# Patient Record
Sex: Female | Born: 1935
Health system: Southern US, Community
[De-identification: ages and names within clinical notes are randomized; demographics above are authoritative.]

## PROBLEM LIST (undated history)

## (undated) DIAGNOSIS — R0602 Shortness of breath: Secondary | ICD-10-CM

## (undated) DIAGNOSIS — I10 Essential (primary) hypertension: Secondary | ICD-10-CM

## (undated) DIAGNOSIS — G988 Other disorders of nervous system: Secondary | ICD-10-CM

## (undated) DIAGNOSIS — E559 Vitamin D deficiency, unspecified: Secondary | ICD-10-CM

## (undated) DIAGNOSIS — R05 Cough: Secondary | ICD-10-CM

## (undated) DIAGNOSIS — E0590 Thyrotoxicosis, unspecified without thyrotoxic crisis or storm: Secondary | ICD-10-CM

## (undated) DIAGNOSIS — N183 Chronic kidney disease, stage 3 unspecified: Secondary | ICD-10-CM

## (undated) DIAGNOSIS — R918 Other nonspecific abnormal finding of lung field: Secondary | ICD-10-CM

## (undated) DIAGNOSIS — D696 Thrombocytopenia, unspecified: Secondary | ICD-10-CM

## (undated) DIAGNOSIS — C801 Malignant (primary) neoplasm, unspecified: Secondary | ICD-10-CM

## (undated) DIAGNOSIS — M1990 Unspecified osteoarthritis, unspecified site: Secondary | ICD-10-CM

## (undated) DIAGNOSIS — K219 Gastro-esophageal reflux disease without esophagitis: Secondary | ICD-10-CM

## (undated) DIAGNOSIS — R519 Headache, unspecified: Secondary | ICD-10-CM

## (undated) DIAGNOSIS — R51 Headache: Secondary | ICD-10-CM

## (undated) DIAGNOSIS — I129 Hypertensive chronic kidney disease with stage 1 through stage 4 chronic kidney disease, or unspecified chronic kidney disease: Secondary | ICD-10-CM

## (undated) HISTORY — DX: Hypertensive chronic kidney disease with stage 1 through stage 4 chronic kidney disease, or unspecified chronic kidney disease: I12.9

## (undated) HISTORY — DX: Unspecified osteoarthritis, unspecified site: M19.90

## (undated) HISTORY — PX: BACK SURGERY: SHX140

## (undated) HISTORY — PX: NECK SURGERY: SHX720

## (undated) HISTORY — DX: Thrombocytopenia, unspecified: D69.6

## (undated) HISTORY — DX: Other disorders of nervous system: G98.8

## (undated) HISTORY — DX: Other nonspecific abnormal finding of lung field: R91.8

## (undated) HISTORY — DX: Chronic kidney disease, stage 3 unspecified: N18.30

## (undated) HISTORY — DX: Vitamin D deficiency, unspecified: E55.9

## (undated) HISTORY — DX: Cough: R05

## (undated) HISTORY — DX: Chronic kidney disease, stage 3 (moderate): N18.3

## (undated) HISTORY — PX: EYE SURGERY: SHX253

---

## 2003-04-21 ENCOUNTER — Ambulatory Visit (HOSPITAL_COMMUNITY): Admission: RE | Admit: 2003-04-21 | Discharge: 2003-04-21 | Payer: Self-pay | Admitting: Internal Medicine

## 2003-10-11 ENCOUNTER — Emergency Department (HOSPITAL_COMMUNITY): Admission: EM | Admit: 2003-10-11 | Discharge: 2003-10-11 | Payer: Self-pay | Admitting: Emergency Medicine

## 2003-12-15 ENCOUNTER — Ambulatory Visit: Payer: Self-pay | Admitting: Family Medicine

## 2004-04-04 ENCOUNTER — Encounter: Admission: RE | Admit: 2004-04-04 | Discharge: 2004-04-04 | Payer: Self-pay | Admitting: Orthopedic Surgery

## 2004-06-27 ENCOUNTER — Inpatient Hospital Stay (HOSPITAL_COMMUNITY): Admission: RE | Admit: 2004-06-27 | Discharge: 2004-07-01 | Payer: Self-pay | Admitting: Neurological Surgery

## 2005-02-10 ENCOUNTER — Emergency Department (HOSPITAL_COMMUNITY): Admission: EM | Admit: 2005-02-10 | Discharge: 2005-02-10 | Payer: Self-pay | Admitting: Family Medicine

## 2005-07-22 ENCOUNTER — Emergency Department (HOSPITAL_COMMUNITY): Admission: EM | Admit: 2005-07-22 | Discharge: 2005-07-22 | Payer: Self-pay | Admitting: Family Medicine

## 2005-08-15 ENCOUNTER — Emergency Department (HOSPITAL_COMMUNITY): Admission: EM | Admit: 2005-08-15 | Discharge: 2005-08-15 | Payer: Self-pay | Admitting: Family Medicine

## 2005-09-13 ENCOUNTER — Emergency Department (HOSPITAL_COMMUNITY): Admission: EM | Admit: 2005-09-13 | Discharge: 2005-09-13 | Payer: Self-pay | Admitting: Family Medicine

## 2005-09-23 ENCOUNTER — Emergency Department (HOSPITAL_COMMUNITY): Admission: EM | Admit: 2005-09-23 | Discharge: 2005-09-23 | Payer: Self-pay | Admitting: Family Medicine

## 2005-10-16 ENCOUNTER — Encounter: Admission: RE | Admit: 2005-10-16 | Discharge: 2005-10-16 | Payer: Self-pay | Admitting: Neurology

## 2005-11-15 ENCOUNTER — Emergency Department (HOSPITAL_COMMUNITY): Admission: EM | Admit: 2005-11-15 | Discharge: 2005-11-15 | Payer: Self-pay | Admitting: Emergency Medicine

## 2006-08-22 ENCOUNTER — Emergency Department (HOSPITAL_COMMUNITY): Admission: EM | Admit: 2006-08-22 | Discharge: 2006-08-22 | Payer: Self-pay | Admitting: Family Medicine

## 2006-08-31 ENCOUNTER — Emergency Department (HOSPITAL_COMMUNITY): Admission: EM | Admit: 2006-08-31 | Discharge: 2006-08-31 | Payer: Self-pay | Admitting: Emergency Medicine

## 2007-11-19 ENCOUNTER — Encounter: Admission: RE | Admit: 2007-11-19 | Discharge: 2007-11-19 | Payer: Self-pay | Admitting: Neurology

## 2008-04-06 ENCOUNTER — Emergency Department (HOSPITAL_COMMUNITY): Admission: EM | Admit: 2008-04-06 | Discharge: 2008-04-06 | Payer: Self-pay | Admitting: Family Medicine

## 2008-08-11 ENCOUNTER — Inpatient Hospital Stay (HOSPITAL_COMMUNITY): Admission: RE | Admit: 2008-08-11 | Discharge: 2008-08-12 | Payer: Self-pay | Admitting: Neurological Surgery

## 2009-02-05 ENCOUNTER — Encounter (INDEPENDENT_AMBULATORY_CARE_PROVIDER_SITE_OTHER): Payer: Self-pay | Admitting: *Deleted

## 2009-02-09 ENCOUNTER — Encounter (INDEPENDENT_AMBULATORY_CARE_PROVIDER_SITE_OTHER): Payer: Self-pay | Admitting: *Deleted

## 2009-02-11 ENCOUNTER — Ambulatory Visit: Payer: Self-pay | Admitting: Gastroenterology

## 2009-02-19 ENCOUNTER — Ambulatory Visit: Payer: Self-pay | Admitting: Gastroenterology

## 2009-02-26 ENCOUNTER — Ambulatory Visit: Payer: Self-pay | Admitting: Gastroenterology

## 2009-03-02 ENCOUNTER — Encounter: Payer: Self-pay | Admitting: Gastroenterology

## 2009-04-05 ENCOUNTER — Encounter: Admission: RE | Admit: 2009-04-05 | Discharge: 2009-04-05 | Payer: Self-pay | Admitting: Obstetrics and Gynecology

## 2009-06-14 ENCOUNTER — Encounter (HOSPITAL_COMMUNITY): Admission: RE | Admit: 2009-06-14 | Discharge: 2009-09-12 | Payer: Self-pay | Admitting: Endocrinology

## 2009-07-08 ENCOUNTER — Ambulatory Visit (HOSPITAL_COMMUNITY): Admission: RE | Admit: 2009-07-08 | Discharge: 2009-07-08 | Payer: Self-pay | Admitting: Endocrinology

## 2009-08-04 ENCOUNTER — Encounter: Admission: RE | Admit: 2009-08-04 | Discharge: 2009-08-04 | Payer: Self-pay | Admitting: Endocrinology

## 2009-08-04 ENCOUNTER — Other Ambulatory Visit: Admission: RE | Admit: 2009-08-04 | Discharge: 2009-08-04 | Payer: Self-pay | Admitting: Interventional Radiology

## 2009-08-27 ENCOUNTER — Ambulatory Visit (HOSPITAL_COMMUNITY): Admission: RE | Admit: 2009-08-27 | Discharge: 2009-08-27 | Payer: Self-pay | Admitting: Endocrinology

## 2009-09-29 ENCOUNTER — Encounter: Admission: RE | Admit: 2009-09-29 | Discharge: 2009-09-29 | Payer: Self-pay | Admitting: Obstetrics and Gynecology

## 2010-02-13 ENCOUNTER — Encounter: Payer: Self-pay | Admitting: Orthopedic Surgery

## 2010-02-13 ENCOUNTER — Encounter: Payer: Self-pay | Admitting: Endocrinology

## 2010-02-22 NOTE — Letter (Signed)
Summary: Anticoagulation Modification Letter  [facility]  [place], [place] [address]   Phone: [phone]  Fax: [phone]    February 11, 2009  Re:    [name] DOB:    07/01/1936 MRN:    [id number]    Dear Dr Margette Fast,  We have scheduled the above patient for an endoscopic procedure. Our records show that  he/she is on anticoagulation therapy. Please advise as to how long the patient may come off their therapy of plavix prior to the scheduled procedure(s) on 02/26/09.   Please fax back/or route the completed form to Patty at [phone].  Thank you for your help with this matter.  Sincerely,  Christian Mate CMA Deborra Medina)   Physician Recommendation:  Hold Plavix 7 days prior ________________  Hold Coumadin 5 days prior ____________  Other ______________________________     Appended Document: Anticoagulation Modification Letter response recieved from Dr Jannifer Franklin pt aware ok to hold plavix and letter to be scanned into EMR

## 2010-02-22 NOTE — Miscellaneous (Signed)
Summary: SCREENING COLON AGE--CH  Clinical Lists Changes  Medications: Added new medication of MOVIPREP 100 GM  SOLR (PEG-KCL-NACL-NASULF-NA ASC-C) As directed - Signed Rx of MOVIPREP 100 GM  SOLR (PEG-KCL-NACL-NASULF-NA ASC-C) As directed;  #1 x 0;  Signed;  Entered by: Ernestine Conrad RN;  Authorized by: Milus Banister MD;  Method used: Electronically to [facility]. [phone]*, [address] [place], [place], [place]  [address], Ph: [phone], Fax: [phone] Allergies: Added new allergy or adverse reaction of ASA Observations: Added new observation of NKA: F (02/11/2009 12:36)    Prescriptions: MOVIPREP 100 GM  SOLR (PEG-KCL-NACL-NASULF-NA ASC-C) As directed  #1 x 0   Entered by:   Ernestine Conrad RN   Authorized by:   Milus Banister MD   Signed by:   Ernestine Conrad RN on 02/11/2009   Method used:   Electronically to        [facility] [place] (retail)       [address]       [place], [place]  [address]       Ph: [phone]       Fax: [phone]   RxID:   [id number]

## 2010-02-22 NOTE — Procedures (Signed)
Summary: Request to hold plavix prior to procedure/[facility]  Request to hold plavix prior to procedure/[facility]   Imported By: Phillis Knack 02/25/2009 08:48:20  _____________________________________________________________________  External Attachment:    Type:   Image     Comment:   External Document

## 2010-02-22 NOTE — Letter (Signed)
Summary: Previsit letter  [facility]  [place], [place] [address]   Phone: [phone]  Fax: [phone]       02/05/2009 MRN: [id number]  [name] [address] [place], [place]  [address]  Dear Ms. Hinkson,  Welcome to the Gastroenterology Division at [facility].    You are scheduled to see a nurse for your pre-procedure visit on 02-12-09 at 1:30p.m. on the 3rd floor at [facility], [place] [employer].  We ask that you try to arrive at our office 15 minutes prior to your appointment time to allow for check-in.  Your nurse visit will consist of discussing your medical and surgical history, your immediate family medical history, and your medications.    Please bring a complete list of all your medications or, if you prefer, bring the medication bottles and we will list them.  We will need to be aware of both prescribed and over the counter drugs.  We will need to know exact dosage information as well.  If you are on blood thinners (Coumadin, Plavix, Aggrenox, Ticlid, etc.) please call our office today/prior to your appointment, as we need to consult with your physician about holding your medication.   Please be prepared to read and sign documents such as consent forms, a financial agreement, and acknowledgement forms.  If necessary, and with your consent, a friend or relative is welcome to sit-in on the nurse visit with you.  Please bring your insurance card so that we may make a copy of it.  If your insurance requires a referral to see a specialist, please bring your referral form from your primary care physician.  No co-pay is required for this nurse visit.     If you cannot keep your appointment, please call [phone] to cancel or reschedule prior to your appointment date.  This allows [place] the opportunity to schedule an appointment for another patient in need of care.    Thank you for choosing [facility] for your medical needs.   We appreciate the opportunity to care for you.  Please visit [place] at our website  to learn more about our practice.                     Sincerely.                                                                                                                   The Gastroenterology Division

## 2010-02-22 NOTE — Letter (Signed)
Summary: [facility] Instructions  [facility]  [place], [place] [address]   Phone: [phone]  Fax: [phone]       [name]    07/01/1936    MRN: [id number]        Procedure Day Sudie Grumbling:  Wendi Snipes  02/26/09     Arrival Time:  8:30AM      Procedure Time:  9:30AM     Location of Procedure:                    [facility] _  [place] (4th Floor)                        [place]   Starting 5 days prior to your procedure 02/21/09 do not eat nuts, seeds, popcorn, corn, beans, peas,  salads, or any raw vegetables.  Do not take any fiber supplements (e.g. Metamucil, Citrucel, and Benefiber).  THE DAY BEFORE YOUR PROCEDURE         DATE: 02/25/09  DAY: THURSDAY  1.  Drink clear liquids the entire day-NO SOLID FOOD  2.  Do not drink anything colored red or purple.  Avoid juices with pulp.  No orange juice.  3.  Drink at least 64 oz. (8 glasses) of fluid/clear liquids during the day to prevent dehydration and help the prep work efficiently.  CLEAR LIQUIDS INCLUDE: Water Jello Ice Popsicles Tea (sugar ok, no milk/cream) Powdered fruit flavored drinks Coffee (sugar ok, no milk/cream) Gatorade Juice: apple, white grape, white cranberry  Lemonade Clear bullion, consomm, broth Carbonated beverages (any kind) Strained chicken noodle soup Hard Candy                             4.  In the morning, mix first dose of MoviPrep solution:    Empty 1 Pouch A and 1 Pouch B into the disposable container    Add lukewarm drinking water to the top line of the container. Mix to dissolve    Refrigerate (mixed solution should be used within 24 hrs)  5.  Begin drinking the prep at 5:00 p.m. The MoviPrep container is divided by 4 marks.   Every 15 minutes drink the solution down to the next mark (approximately 8 oz) until the full liter is complete.   6.  Follow completed prep with 16 oz of clear liquid of your choice (Nothing  red or purple).  Continue to drink clear liquids until bedtime.  7.  Before going to bed, mix second dose of MoviPrep solution:    Empty 1 Pouch A and 1 Pouch B into the disposable container    Add lukewarm drinking water to the top line of the container. Mix to dissolve    Refrigerate  THE DAY OF YOUR PROCEDURE      DATE: 02/26/09 DAY: FRIDAY  Beginning at 4:30AM (5 hours before procedure):         1. Every 15 minutes, drink the solution down to the next mark (approx 8 oz) until the full liter is complete.  2. Follow completed prep with 16 oz. of clear liquid of your choice.    3. You may drink clear liquids until 7:30AM (2 HOURS BEFORE PROCEDURE).   MEDICATION INSTRUCTIONS  Unless otherwise instructed, you should take regular prescription medications with a small sip of water   as early as possible the morning  of your procedure.  Diabetic patients - see separate instructions.  Stop taking Plavix or Aggrenox on  _  _  (7 days before procedure).              OTHER INSTRUCTIONS  You will need a responsible adult at least 73 years of age to accompany you and drive you home.   This person must remain in the waiting room during your procedure.  Wear loose fitting clothing that is easily removed.  Leave jewelry and other valuables at home.  However, you may wish to bring a book to read or  an iPod/MP3 player to listen to music as you wait for your procedure to start.  Remove all body piercing jewelry and leave at home.  Total time from sign-in until discharge is approximately 2-3 hours.  You should go home directly after your procedure and rest.  You can resume normal activities the  day after your procedure.  The day of your procedure you should not:   Drive   Make legal decisions   Operate machinery   Drink alcohol   Return to work  You will receive specific instructions about eating, activities and medications before you leave.    The above  instructions have been reviewed and explained to me by   Ernestine Conrad, RN_____________________    I fully understand and can verbalize these instructions _____________________________ Date _________

## 2010-02-22 NOTE — Miscellaneous (Signed)
Summary: Problem added to wrong chart  Clinical Lists Changes  Problems: Removed problem of ABDOMINAL PAIN RIGHT UPPER QUADRANT (ICD-789.01) Removed problem of HEARTBURN (ICD-787.1)

## 2010-02-22 NOTE — Procedures (Signed)
Summary: Colonoscopy  Patient: [name] Note: All result statuses are Final unless otherwise noted.  Tests: (1) Colonoscopy (COL)   COL Colonoscopy           [facility].     [place], [place]  [address]           COLONOSCOPY PROCEDURE REPORT           PATIENT:  Cheryl, Bishop  MR#:  [id number]     BIRTHDATE:  07/01/1936, 23 yrs. old  GENDER:  female     ENDOSCOPIST:  Milus Banister, MD     Referred by:  Arvella Nigh, M.D.     PROCEDURE DATE:  02/26/2009     PROCEDURE:  Colonoscopy with biopsy and snare polypectomy     ASA CLASS:  Class II     INDICATIONS:  Routine Risk Screening     MEDICATIONS:   Fentanyl 50 mcg IV, Versed 7 mg IV     DESCRIPTION OF PROCEDURE:   After the risks benefits and     alternatives of the procedure were thoroughly explained, informed     consent was obtained.  Digital rectal exam was performed and     revealed no rectal masses.   The LB [id number]-[id number] endoscope     was introduced through the anus and advanced to the cecum, which     was identified by both the appendix and ileocecal valve, without     limitations.  The quality of the prep was good, using MoviPrep.     The instrument was then slowly withdrawn as the colon was fully     examined.     <<PROCEDUREIMAGES>>     FINDINGS:  Three small sessile polyps were found. These were     located in transverse and descending colon segments, they ranged     in size from 2-30mm across. Two were removed with cold snare, one     with cold biopsy. All were sent to pathology (jar 1) (see image3,     image4, and image5).  This was otherwise a normal examination of     the colon (see image2, image1, and image6).   Retroflexed views in     the rectum revealed no abnormalities.    The scope was then     withdrawn from the patient and the procedure completed.           COMPLICATIONS:  None     ENDOSCOPIC IMPRESSION:     1) Three small polyps, all removed and sent to  pathology     2) Otherwise normal examination           RECOMMENDATIONS:     1) If the polyp(s) removed today are proven to be adenomatous     (pre-cancerous) polyps, you will need a colonoscopy in 3-5 years.     Otherwise you should continue to follow colorectal cancer     screening guidelines for "routine risk" patients with a     colonoscopy in 10 years.     2) You will receive a letter within 1-2 weeks with the results     of your biopsy as well as final recommendations. Please call my     office if you have not received a letter after 3 weeks.     3) OK to restart Plavix today.           ______________________________  Milus Banister, MD           n.     Lorrin Mais:   Milus Banister at 02/26/2009 09:33 AM           Barrie Dunker, [id number]  Note: An exclamation mark (!) indicates a result that was not dispersed into the flowsheet. Document Creation Date: 02/26/2009 9:34 AM _______________________________________________________________________  (1) Order result status: Final Collection or observation date-time: 02/26/2009 09:28 Requested date-time:  Receipt date-time:  Reported date-time:  Referring Physician:   Ordering Physician: Owens Loffler [phone]) Specimen Source:  Source: Tawanna Cooler Order Number: [id number] Lab site:   Appended Document: Colonoscopy recall     Procedures Next Due Date:    Colonoscopy: 02/2012

## 2010-02-22 NOTE — Letter (Signed)
Summary: Results Letter  [facility]  [place], [place] [address]   Phone: [phone]  Fax: [phone]        March 02, 2009 MRN: [id number]    [name] [address] [place], [place]  [address]    Dear [name],   The polyp(s) removed during your recent procedure were proven to be adenomatous.  These are pre-cancerous polyps that may have grown into cancers if they had not been removed.  Based on current nationally recognized surveillance guidelines, I recommend that you have a repeat colonoscopy in 3 years.   We will therefore put your information in our reminder system and will contact you in 3 years to schedule a repeat procedure.  Please call if you have any questions or concerns.       Sincerely,  Milus Banister MD  This letter has been electronically signed by your physician.  Appended Document: Results Letter Letter mailed 2.10.11

## 2010-02-22 NOTE — Assessment & Plan Note (Signed)
History of Present Illness Visit Type: new patient Primary GI MD: Owens Loffler MD Chief Complaint: screening colon-on Plavix History of Present Illness:     very pleasant 75 year old woman who is here to discuss screening colonoscopy.  has never had colonoscopy or colorectal cancer screening from what I can tell..  Used to have constipation, but this is better now.  No bleeding.  Her weight in the past year is up a few pounds.  She had a neurologic event about a year ago, is on plavix for this.  Dr. Jannifer Franklin, neurologist.    has never had CAD that she knows about.             Current Medications (verified): 1)  Plavix 75 Mg Tabs (Clopidogrel Bisulfate) .... Take 1 Tablet By Mouth Once A Day 2)  Magnesium 200 Mg Tabs (Magnesium) .... Take 1 Tablet By Mouth Once A Day 3)  Centrum  Tabs (Multiple Vitamins-Minerals) .... Take 1 Tablet By Mouth Once A Day 4)  Fish Oil  Oil (Fish Oil) .... Take 1 Tablet By Mouth Once A Day 5)  Niacin 500 Mg Tabs (Niacin) .... Take 1 Tab By Mouth At Bedtime 6)  Omeprazole 20 Mg Cpdr (Omeprazole) .... Take 1 Capsule By Mouth Once A Day  Allergies (verified): 1)  ! Asa  Past History:  Past Medical History: arthritis CHF Diabetes Neurologic event 2009, put on aspirin and then eventually Plavix do to aspirin intolerance  Family History: no colon cancer lung cancer  Social History: she is single, she has 2 children, she does not work, she smoke cigarettes currently, she does not drink alcohol, she drinks 2 caffeinated beverages a day  Review of Systems       Pertinent positive and negative review of systems were noted in the above HPI and GI specific review of systems.  All other review of systems was otherwise negative.   Vital Signs:  Patient profile:   75 year old female Height:      60 inches Weight:      156 pounds BMI:     30.58 Pulse rate:   72 / minute Pulse rhythm:   regular BP sitting:   124 / 76  (left arm) Cuff size:    regular  Vitals Entered By: Abelino Derrick CMA Deborra Medina) (February 19, 2009 2:57 PM)  Physical Exam  Additional Exam:  Constitutional: generally well appearing Psychiatric: alert and oriented times 3 Eyes: extraocular movements intact Mouth: oropharynx moist, no lesions Neck: supple, no lymphadenopathy Cardiovascular: heart regular rate and rythm Lungs: CTA bilaterally Abdomen: soft, non-tender, non-distended, no obvious ascites, no peritoneal signs, normal bowel sounds Extremities: no lower extremity edema bilaterally Skin: no lesions on visible extremities    Impression & Recommendations:  Problem # 1:  Routine risk for colon cancer, increased risk for complications during procedure given Plavix use we had already contacted her neurologist, Dr. Jannifer Franklin, about her holding the Plavix for 5-7 days prior to the colonoscopy which is already scheduled for next Friday. He documented that it is okay to hold. We will proceed with that plan.  Patient Instructions: 1)  You will be scheduled to have a colonoscopy (already set for next Friday). 2)  We will contact Dr. Jannifer Franklin about holding your plavix for 5 days prior to the procedure. 3)  A copy of this information will be sent to Dr. Radene Knee. 4)  The medication list was reviewed and reconciled.  All changed / newly prescribed medications were explained.  A complete medication list was provided to the patient / caregiver.

## 2010-03-21 ENCOUNTER — Other Ambulatory Visit: Payer: Self-pay | Admitting: Obstetrics and Gynecology

## 2010-03-21 DIAGNOSIS — Z1231 Encounter for screening mammogram for malignant neoplasm of breast: Secondary | ICD-10-CM

## 2010-04-08 ENCOUNTER — Ambulatory Visit
Admission: RE | Admit: 2010-04-08 | Discharge: 2010-04-08 | Disposition: A | Discharge status: Home / Self Care | Payer: Medicare Other | Source: Ambulatory Visit | Attending: Obstetrics and Gynecology | Admitting: Obstetrics and Gynecology

## 2010-04-08 DIAGNOSIS — Z1231 Encounter for screening mammogram for malignant neoplasm of breast: Secondary | ICD-10-CM

## 2010-05-01 LAB — BASIC METABOLIC PANEL
CO2: 26 mEq/L (ref 19–32)
Calcium: 9.7 mg/dL (ref 8.4–10.5)
Creatinine, Ser: 1.04 mg/dL (ref 0.4–1.2)
GFR calc Af Amer: >60 mL/min (ref >60)
GFR calc non Af Amer: 52 mL/min — ABNORMAL LOW (ref >60)
Glucose, Bld: 92 mg/dL (ref 70–99)
Sodium: 140 mEq/L (ref 135–145)

## 2010-05-01 LAB — APTT: aPTT: 33 seconds (ref 24–37)

## 2010-05-01 LAB — PROTIME-INR
INR: 1 (ref 0.00–1.49)
Prothrombin Time: 13.1 seconds (ref 11.6–15.2)

## 2010-05-01 LAB — CBC
Hemoglobin: 11.8 g/dL — ABNORMAL LOW (ref 12.0–15.0)
MCHC: 33.3 g/dL (ref 30.0–36.0)
RBC: 3.86 MIL/uL — ABNORMAL LOW (ref 3.87–5.11)
RDW: 15 % (ref 11.5–15.5)

## 2010-06-07 NOTE — Op Note (Signed)
[name], Bishop                ACCOUNT NO.:  [id number]   MEDICAL RECORD NO.:  [id number]          PATIENT TYPE:  INP   LOCATION:  3536                         FACILITY:  [facility]   PHYSICIAN:  Earleen Newport, M.D.  DATE OF BIRTH:  07/01/1936   DATE OF PROCEDURE:  08/11/2008  DATE OF DISCHARGE:                               OPERATIVE REPORT   PREOPERATIVE DIAGNOSES:  Herniated nucleus pulposus and spondylosis, C6-  C7 with left cervical radiculopathy.   POSTOPERATIVE DIAGNOSES:  Herniated nucleus pulposus and spondylosis, C6-  C7 with left cervical radiculopathy.   PROCEDURE:  Anterior cervical decompression, C6-C7 arthrodesis with  structural allograft, Alphatec plate fixation [id number].   SURGEON:  Earleen Newport, MD   FIRST ASSISTANT:  Ophelia Charter, MD   ANESTHESIA:  General endotracheal.   INDICATIONS:  [name] is a 75 year old individual who has had significant  neck, shoulder, and left arm pain.  She has evidence of herniated  nucleus pulposus at the C6-C7 level on the left side and she is taken to  the operating room now to undergo anterior decompression arthrodesis.   PROCEDURE:  The patient was brought to the operating room supine on the  stretcher.  After smooth induction of general endotracheal anesthesia,  she was placed in 5-pounds Halter traction.  Neck was prepped with  alcohol and DuraPrep and draped in a sterile fashion.  Transverse  incision was created in the left side of the neck and this was dissected  down through the platysma, the plane between the sternocleidomastoid and  strap muscles dissected bluntly until the prevertebral space was  reached, first identifiable disk space was noted radiographically to be  that of C4-C5.  Dissection was carried inferiorly to expose C6-C7 and  longus coli muscle was stripped off on either side of this interspace  and a self-retaining Caspar retractor was placed in the wound.  Anterior  diskectomy was then performed  by opening anterior longitudinal ligament  with a #15 blade and a combination of Kerrison rongeurs was used to  evacuate a significant quantity of severely degenerated and desiccated  disk material.  As the region of posterior longitudinal ligament was  reached on the left side, there was noted to be some breaching of the  ligament with an osteophytic overgrowth into the region of the foramen.  The osteophyte was drilled down with a 2.3 mm dissecting tool.  An high-  speed bur and then the ligament was taken up and the lateral recess and  the C7 nerve root was decompressed in this fashion.  Hemostasis from  epidural bleeding was controlled with some bipolar cautery and some  small pledgets, Gelfoam soaked in thrombin, which were removed later.  On the right side then decompression was easily taken out to the  uncinate process where there was a small spur.  This was drilled down  and decompressed similarly.  In the end, the interspace was  decorticated, carefully removed all remnants of any endplate cartilage.  A 7-mm Alphagraft was then sized, shaped, and fitted into the interspace  filled with demineralized bone matrix.  This was countersunk  appropriately and a 14-mm standard size Alphatec plate was fitted with  14-mm variable angle locking screws.  Final radiograph identified good  position of the surgical construct.  Soft-tissue hemostasis was then  carefully and meticulously obtained and once felt to be adequate, the  platysma was closed with 3-0 Vicryl in interrupted fashion, 3-0 Vicryl  using subcuticular tissues and Dermabond was placed on the skin.  The  patient tolerated the procedure well, was returned to the recovery room  in stable condition.      Earleen Newport, M.D.  Electronically Signed     HJE/MEDQ  D:  08/11/2008  T:  08/12/2008  Job:  [id number]

## 2010-06-10 NOTE — Op Note (Signed)
[name], [name]                ACCOUNT NO.:  [id number]   MEDICAL RECORD NO.:  [id number]          PATIENT TYPE:  INP   LOCATION:  2899                         FACILITY:  [facility]   PHYSICIAN:  Earleen Newport, M.D.  DATE OF BIRTH:  07/01/1936   DATE OF PROCEDURE:  06/27/2004  DATE OF DISCHARGE:                                 OPERATIVE REPORT   PREOPERATIVE DIAGNOSES:  1.  Lumbar spondylolisthesis with stenosis, L4-L5.  2.  Lumbar radiculopathy.   POSTOPERATIVE DIAGNOSES:  1.  Lumbar spondylolisthesis with stenosis, L4-L5.  2.  Lumbar radiculopathy.   PROCEDURE:  Gill procedure at L4, posterior lumbar interbody arthrodesis, L4-  L5, with PEEK bone spacers, pedicle screw fixation at L4-L5, posterior and  posterolateral arthrodesis with iliac crest bone graft.   SURGEON:  Earleen Newport, M.D.   FIRST ASSISTANT:  Leeroy Cha, M.D.   ANESTHESIA:  General endotracheal.   INDICATIONS:  Ms. [name] is a 75 year old individual who has had significant  back pain with bilateral leg pain and symptoms of neurogenic claudication  and radiculopathy.  She has been evaluated and found to have a severe  spondylitic stenosis with spondylolisthesis at L4-L5.  She is taken to the  operating room to undergo surgical decompression and stabilization.   PROCEDURE:  The patient was brought to the operating room supine on a  stretcher.  After smooth induction of general endotracheal anesthesia, she  was turned prone, the back was prepped with DuraPrep and draped in a sterile  fashion.  Midline incision was created and carried down to the lumbodorsal  fascia in the mid-portion of the lumbar spine and this was found to be  rather thickened.  The first spinous process was that of L4, then the  interspace at L4-L5 was identified by taking down the lumbodorsal fascia in  a subperiosteal fashion.  Self-retaining retractors were used to retract the  paraspinous musculature and the facet joint at L4-L5  was exposed, as was the  facet joint at L3-L4 superior to this.  Laminotomies were then created  bilaterally, removing the entire portion of the laminar arch out to and  including the entirety of the facet at L4-L5.  This allowed for  decompression of the common dural tube.  It was noted that the dural tube  was snug secondary to the severe overgrowth and hypertrophy of the yellow  ligament.  The yellow ligament was elevated and removed, and the  decompression was taken out laterally.  The L5 nerve root was decompressed  and the epidural tissue was mobilized so as to allow retraction of the  common dural tube and the L5 nerve root off of the disk space which was  noted to be bulged dorsally, significantly adding to the stenosis; this was  done bilaterally and then a diskectomy was performed on the right side,  removing a significant quantity of severely degenerated disk material from  the L4-5 disk space.  The entirety of the disk space was evacuated in this  fashion.  The endplates were identified and these were decorticated using a  combination of  ring curettes, straight and angled curette, a raspatory to  remove any remnants of cartilaginous endplate from the endplates of the disk  space.  Once this was accomplished, the interspace was sized and it was felt  that a 12-mm interbody spacer would fit nicely into the interspace.  These  were prepared and packed with the patient's own bone obtained from the  laminotomy.  In the meantime, we performed a separate fascial incision over  the posterosuperior iliac crest to expose this region, then using a Taylor  retractor, the outer table of the gluteal fascia was opened away from the  iliac crest and the outer table of ilium was harvested used using a  combination of gouges and osteotomes to harvest first the corticocancellous  bone and then the underlying cancellus bone,  When an adequate sample of  bone was obtained, then the Taylor retractor  was removed and the gluteal  fascia was reapproximated with #1 Vicryl in an interrupted fashion.  The  bone was then used immediately by packing it first into the depths of the  interspace and placing the PEEK bone spacers that were already packed and  then placing additional bone into the interspace, packing both laterally and  medially all around the cages.  Once the bone was packed fully, then  attention was turned to the pedicle entry sites which were chosen at L4-L5  using fluoroscopic localization; this was done with the help of Dr. Joya Salm.  Once the pedicle sites were chosen and tapped appropriately, 6.5 x 40-mm  screws were placed into the L4 and the L5 pedicles, both on the left and the  right.  A 40-mm precontoured rod was then used to connect these 2 pedicle  screws and this was done so and then ultimately final radiographs were  obtained.  With the patient reduced to the neutral position, the system was  locked down and torqued into its final positioning.  Then the posterior  aspects had been decorticated using a high-speed round bur.  Five  milliliters of bone substitute were also used to mix with the patient's own  bone that was obtained from the posterosuperior iliac crest.  Two Healos  pads were placed over laminotomy defects and the rest of the bone was then  overlaid on top of this to form a posterior onlay graft; once this was  accomplished, then the retractor was removed.  The fascia was closed with #1  Vicryl in interrupted fashion, 2-0 Vicryl was used in the subcutaneous  tissues and 3-0 Vicryl was used to close the subcuticular space.  The  patient tolerated the procedure well.  The blood loss was estimated at 300  mL.       HJE/MEDQ  D:  06/27/2004  T:  06/28/2004  Job:  [id number]

## 2010-06-10 NOTE — Discharge Summary (Signed)
NAMEMARGERIE, RAHAMAN                ACCOUNT NO.:  [id number]   MEDICAL RECORD NO.:  [id number]          PATIENT TYPE:  INP   LOCATION:  5009                         FACILITY:  [facility]   PHYSICIAN:  Earleen Newport, M.D.  DATE OF BIRTH:  07/01/1936   DATE OF ADMISSION:  06/27/2004  DATE OF DISCHARGE:  07/01/2004                                 DISCHARGE SUMMARY   ADMITTING DIAGNOSES:  1.  Lumbar spondylosis with lumbar stenosis and radiculopathy.  2.  Spondylolisthesis lumbar vertebrae-4/5.   FINAL DIAGNOSES:  1.  Lumbar spondylosis with lumbar stenosis and radiculopathy.  2.  Spondylolisthesis lumbar vertebrae-4/5.   OPERATION:  Gill procedure at L4, posterior lumbar interbody fusion with  cages and iliac crest bone graft. Nonsegmental fixation L4-L5 with pedicle  screws.   SURGEON:  Dr. Ellene Route.   FIRST ASSISTANT:  Dr. Joya Salm and that was on June 27, 2004.   CONDITION ON DISCHARGE:  Improving.   HOSPITAL COURSE:  [name] is a 75 year old individual who has had  significant back and bilateral leg pain with neurogenic claudication and  lumbar radiculopathy. She had failed extensive efforts at conservative  management and was advised regarding surgical intervention which was  performed on the June 27, 2004. Postoperatively, the patient had good  management of pain control for the first 48 hours using PCA pump, which  gradually shift over to oral pain medication. She was ambulated and became  independent with ambulation using a walker. Arrangements have been made for  some home health physical therapy and use a walker at the home in addition  to a 3 and 1. Her pain is under good control with the use of oral Percocet,  Valium as muscle relaxer, in addition to some Flexeril. He was given  prescriptions for each of these medications. He will be seen in the office  in 3-4 weeks time for further follow-up.   Condition on discharge improved      Earleen Newport, M.D.  Electronically Signed     HJE/MEDQ  D:  09/01/2004  T:  09/02/2004  Job:  [id number]

## 2010-11-07 LAB — POCT URINALYSIS DIP (DEVICE)
Ketones, ur: 15 — AB
Nitrite: NEGATIVE
Operator id: 239701
Urobilinogen, UA: 0.2

## 2011-04-18 ENCOUNTER — Encounter: Payer: Self-pay | Admitting: Family Medicine

## 2011-04-18 DIAGNOSIS — I10 Essential (primary) hypertension: Secondary | ICD-10-CM | POA: Diagnosis not present

## 2011-04-18 DIAGNOSIS — Z0000 Encounter for general adult medical examination without abnormal findings: Secondary | ICD-10-CM | POA: Diagnosis not present

## 2011-04-18 DIAGNOSIS — Z79899 Other long term (current) drug therapy: Secondary | ICD-10-CM | POA: Diagnosis not present

## 2011-04-19 DIAGNOSIS — I10 Essential (primary) hypertension: Secondary | ICD-10-CM | POA: Diagnosis not present

## 2011-04-19 DIAGNOSIS — Z0000 Encounter for general adult medical examination without abnormal findings: Secondary | ICD-10-CM | POA: Diagnosis not present

## 2011-04-20 ENCOUNTER — Other Ambulatory Visit: Payer: Self-pay | Admitting: Internal Medicine

## 2011-04-20 ENCOUNTER — Other Ambulatory Visit: Payer: Self-pay | Admitting: Family Medicine

## 2011-04-20 DIAGNOSIS — Z1231 Encounter for screening mammogram for malignant neoplasm of breast: Secondary | ICD-10-CM

## 2011-05-02 ENCOUNTER — Ambulatory Visit
Admission: RE | Admit: 2011-05-02 | Discharge: 2011-05-02 | Disposition: A | Discharge status: Home / Self Care | Payer: Medicare Other | Source: Ambulatory Visit | Attending: Internal Medicine | Admitting: Internal Medicine

## 2011-05-02 DIAGNOSIS — Z1231 Encounter for screening mammogram for malignant neoplasm of breast: Secondary | ICD-10-CM | POA: Diagnosis not present

## 2011-05-09 DIAGNOSIS — H40039 Anatomical narrow angle, unspecified eye: Secondary | ICD-10-CM | POA: Diagnosis not present

## 2011-05-09 DIAGNOSIS — H2510 Age-related nuclear cataract, unspecified eye: Secondary | ICD-10-CM | POA: Diagnosis not present

## 2011-05-09 DIAGNOSIS — H35039 Hypertensive retinopathy, unspecified eye: Secondary | ICD-10-CM | POA: Diagnosis not present

## 2011-05-18 DIAGNOSIS — N959 Unspecified menopausal and perimenopausal disorder: Secondary | ICD-10-CM | POA: Diagnosis not present

## 2011-05-18 DIAGNOSIS — I10 Essential (primary) hypertension: Secondary | ICD-10-CM | POA: Diagnosis not present

## 2011-06-22 DIAGNOSIS — H40039 Anatomical narrow angle, unspecified eye: Secondary | ICD-10-CM | POA: Diagnosis not present

## 2011-06-29 DIAGNOSIS — R42 Dizziness and giddiness: Secondary | ICD-10-CM | POA: Diagnosis not present

## 2011-06-29 DIAGNOSIS — D6489 Other specified anemias: Secondary | ICD-10-CM | POA: Diagnosis not present

## 2011-06-29 DIAGNOSIS — I10 Essential (primary) hypertension: Secondary | ICD-10-CM | POA: Diagnosis not present

## 2011-06-29 DIAGNOSIS — M25519 Pain in unspecified shoulder: Secondary | ICD-10-CM | POA: Diagnosis not present

## 2011-08-08 DIAGNOSIS — H40039 Anatomical narrow angle, unspecified eye: Secondary | ICD-10-CM | POA: Diagnosis not present

## 2011-08-22 DIAGNOSIS — H40039 Anatomical narrow angle, unspecified eye: Secondary | ICD-10-CM | POA: Diagnosis not present

## 2011-09-05 DIAGNOSIS — H40039 Anatomical narrow angle, unspecified eye: Secondary | ICD-10-CM | POA: Diagnosis not present

## 2011-09-28 DIAGNOSIS — D649 Anemia, unspecified: Secondary | ICD-10-CM | POA: Diagnosis not present

## 2011-09-28 DIAGNOSIS — N183 Chronic kidney disease, stage 3 unspecified: Secondary | ICD-10-CM | POA: Diagnosis not present

## 2011-09-28 DIAGNOSIS — Z79899 Other long term (current) drug therapy: Secondary | ICD-10-CM | POA: Diagnosis not present

## 2011-09-28 DIAGNOSIS — D6489 Other specified anemias: Secondary | ICD-10-CM | POA: Diagnosis not present

## 2011-09-28 DIAGNOSIS — I10 Essential (primary) hypertension: Secondary | ICD-10-CM | POA: Diagnosis not present

## 2011-10-31 DIAGNOSIS — I10 Essential (primary) hypertension: Secondary | ICD-10-CM | POA: Diagnosis not present

## 2011-10-31 DIAGNOSIS — N959 Unspecified menopausal and perimenopausal disorder: Secondary | ICD-10-CM | POA: Diagnosis not present

## 2011-10-31 DIAGNOSIS — D6489 Other specified anemias: Secondary | ICD-10-CM | POA: Diagnosis not present

## 2011-10-31 DIAGNOSIS — R42 Dizziness and giddiness: Secondary | ICD-10-CM | POA: Diagnosis not present

## 2011-10-31 DIAGNOSIS — Z0000 Encounter for general adult medical examination without abnormal findings: Secondary | ICD-10-CM | POA: Diagnosis not present

## 2011-10-31 DIAGNOSIS — E785 Hyperlipidemia, unspecified: Secondary | ICD-10-CM | POA: Diagnosis not present

## 2011-10-31 DIAGNOSIS — Z79899 Other long term (current) drug therapy: Secondary | ICD-10-CM | POA: Diagnosis not present

## 2011-10-31 DIAGNOSIS — H9190 Unspecified hearing loss, unspecified ear: Secondary | ICD-10-CM | POA: Diagnosis not present

## 2011-12-07 DIAGNOSIS — Z23 Encounter for immunization: Secondary | ICD-10-CM | POA: Diagnosis not present

## 2012-02-01 ENCOUNTER — Encounter: Payer: Self-pay | Admitting: Family Medicine

## 2012-02-01 DIAGNOSIS — N182 Chronic kidney disease, stage 2 (mild): Secondary | ICD-10-CM | POA: Diagnosis not present

## 2012-02-01 DIAGNOSIS — E7800 Pure hypercholesterolemia, unspecified: Secondary | ICD-10-CM | POA: Diagnosis not present

## 2012-02-01 DIAGNOSIS — I10 Essential (primary) hypertension: Secondary | ICD-10-CM | POA: Diagnosis not present

## 2012-02-01 DIAGNOSIS — I129 Hypertensive chronic kidney disease with stage 1 through stage 4 chronic kidney disease, or unspecified chronic kidney disease: Secondary | ICD-10-CM | POA: Diagnosis not present

## 2012-02-01 DIAGNOSIS — F17200 Nicotine dependence, unspecified, uncomplicated: Secondary | ICD-10-CM | POA: Diagnosis not present

## 2012-02-01 DIAGNOSIS — R0602 Shortness of breath: Secondary | ICD-10-CM | POA: Diagnosis not present

## 2012-02-01 DIAGNOSIS — Z79899 Other long term (current) drug therapy: Secondary | ICD-10-CM | POA: Diagnosis not present

## 2012-02-08 DIAGNOSIS — H402290 Chronic angle-closure glaucoma, unspecified eye, stage unspecified: Secondary | ICD-10-CM | POA: Diagnosis not present

## 2012-02-08 DIAGNOSIS — H2510 Age-related nuclear cataract, unspecified eye: Secondary | ICD-10-CM | POA: Diagnosis not present

## 2012-02-12 ENCOUNTER — Encounter: Payer: Self-pay | Admitting: Gastroenterology

## 2012-02-20 ENCOUNTER — Encounter: Payer: Self-pay | Admitting: Gastroenterology

## 2012-02-29 DIAGNOSIS — I10 Essential (primary) hypertension: Secondary | ICD-10-CM | POA: Diagnosis not present

## 2012-02-29 DIAGNOSIS — F17200 Nicotine dependence, unspecified, uncomplicated: Secondary | ICD-10-CM | POA: Diagnosis not present

## 2012-02-29 DIAGNOSIS — E785 Hyperlipidemia, unspecified: Secondary | ICD-10-CM | POA: Diagnosis not present

## 2012-02-29 DIAGNOSIS — R7989 Other specified abnormal findings of blood chemistry: Secondary | ICD-10-CM | POA: Diagnosis not present

## 2012-04-02 DIAGNOSIS — H2510 Age-related nuclear cataract, unspecified eye: Secondary | ICD-10-CM | POA: Diagnosis not present

## 2012-04-25 ENCOUNTER — Other Ambulatory Visit: Payer: Self-pay

## 2012-04-25 DIAGNOSIS — Z1231 Encounter for screening mammogram for malignant neoplasm of breast: Secondary | ICD-10-CM

## 2012-05-14 DIAGNOSIS — H402290 Chronic angle-closure glaucoma, unspecified eye, stage unspecified: Secondary | ICD-10-CM | POA: Diagnosis not present

## 2012-05-14 DIAGNOSIS — H2510 Age-related nuclear cataract, unspecified eye: Secondary | ICD-10-CM | POA: Diagnosis not present

## 2012-05-23 ENCOUNTER — Ambulatory Visit
Admission: RE | Admit: 2012-05-23 | Discharge: 2012-05-23 | Disposition: A | Discharge status: Home / Self Care | Payer: Medicare Other | Source: Ambulatory Visit

## 2012-05-23 DIAGNOSIS — Z1231 Encounter for screening mammogram for malignant neoplasm of breast: Secondary | ICD-10-CM

## 2012-05-30 ENCOUNTER — Encounter: Payer: Self-pay | Admitting: Family Medicine

## 2012-05-30 DIAGNOSIS — N182 Chronic kidney disease, stage 2 (mild): Secondary | ICD-10-CM | POA: Diagnosis not present

## 2012-05-30 DIAGNOSIS — M542 Cervicalgia: Secondary | ICD-10-CM | POA: Diagnosis not present

## 2012-05-30 DIAGNOSIS — E559 Vitamin D deficiency, unspecified: Secondary | ICD-10-CM | POA: Diagnosis not present

## 2012-05-30 DIAGNOSIS — Z79899 Other long term (current) drug therapy: Secondary | ICD-10-CM | POA: Diagnosis not present

## 2012-05-30 DIAGNOSIS — I129 Hypertensive chronic kidney disease with stage 1 through stage 4 chronic kidney disease, or unspecified chronic kidney disease: Secondary | ICD-10-CM | POA: Diagnosis not present

## 2012-08-20 ENCOUNTER — Encounter: Payer: Self-pay | Admitting: Gastroenterology

## 2012-08-28 DIAGNOSIS — H2510 Age-related nuclear cataract, unspecified eye: Secondary | ICD-10-CM | POA: Diagnosis not present

## 2012-08-28 DIAGNOSIS — H4011X0 Primary open-angle glaucoma, stage unspecified: Secondary | ICD-10-CM | POA: Diagnosis not present

## 2012-09-10 ENCOUNTER — Encounter: Payer: Self-pay | Admitting: Internal Medicine

## 2012-10-02 ENCOUNTER — Encounter: Payer: Self-pay | Admitting: Family Medicine

## 2012-10-02 DIAGNOSIS — Z0000 Encounter for general adult medical examination without abnormal findings: Secondary | ICD-10-CM | POA: Diagnosis not present

## 2012-10-02 DIAGNOSIS — Z79899 Other long term (current) drug therapy: Secondary | ICD-10-CM | POA: Diagnosis not present

## 2012-10-02 DIAGNOSIS — E785 Hyperlipidemia, unspecified: Secondary | ICD-10-CM | POA: Diagnosis not present

## 2012-10-02 DIAGNOSIS — H918X9 Other specified hearing loss, unspecified ear: Secondary | ICD-10-CM | POA: Diagnosis not present

## 2012-10-02 DIAGNOSIS — I10 Essential (primary) hypertension: Secondary | ICD-10-CM | POA: Diagnosis not present

## 2012-10-02 DIAGNOSIS — E559 Vitamin D deficiency, unspecified: Secondary | ICD-10-CM | POA: Diagnosis not present

## 2012-10-02 DIAGNOSIS — N183 Chronic kidney disease, stage 3 unspecified: Secondary | ICD-10-CM | POA: Diagnosis not present

## 2012-10-16 DIAGNOSIS — H903 Sensorineural hearing loss, bilateral: Secondary | ICD-10-CM | POA: Diagnosis not present

## 2012-10-25 DIAGNOSIS — Z23 Encounter for immunization: Secondary | ICD-10-CM | POA: Diagnosis not present

## 2012-10-29 ENCOUNTER — Ambulatory Visit (INDEPENDENT_AMBULATORY_CARE_PROVIDER_SITE_OTHER): Payer: Medicare Other | Admitting: Gastroenterology

## 2012-10-29 ENCOUNTER — Encounter: Payer: Self-pay | Admitting: Gastroenterology

## 2012-10-29 VITALS — BP 128/68 | HR 68 | Ht 60.0 in | Wt 167.0 lb

## 2012-10-29 DIAGNOSIS — Z860100 Personal history of colon polyps, unspecified: Secondary | ICD-10-CM

## 2012-10-29 MED ORDER — MOVIPREP 100 G PO SOLR: 1.0000 | Freq: Once | ORAL | Status: DC

## 2012-10-29 NOTE — Patient Instructions (Addendum)
You will be set up for a colonoscopy for polyp surveillance (LEC, moderate sedation). We will contact Dr. Bryon Lions about holding your plavix for 5 days. You will be contacted by our office prior to your procedure for directions on holding your Plavix.  If you do not hear from our office 1 week prior to your scheduled procedure, please call [phone] to discuss.

## 2012-10-29 NOTE — Progress Notes (Signed)
Review of pertinent gastrointestinal problems: 1. Adenomatous colon polyps: Colonoscopy Ardis Hughs 02/2009 for routine risk screening; 3 small TAs removed.  Recommended recall at 3 years.  HPI: This is a   very pleasant 77 year old woman who is with her daughter today. I last saw her over 3 years ago the time of a colonoscopy, see those results of above.  No colon cancer in family.    Gets around very well.    She had dark stool a few times, ? From iron pills.  No serious changes in her bowels.    Overall weight up a bit after she stopped smoking.  Still on plavix for neurologic reasons.   Review of systems: Pertinent positive and negative review of systems were noted in the above HPI section. Complete review of systems was performed and was otherwise normal.    Past Medical History  Diagnosis Date  . Arthritis   . CHF (congestive heart failure)   . Diabetes   . Neurologic disorder     2009  . Benign hypertensive kidney disease with chronic kidney disease stage I through stage IV, or unspecified(403.10)   . Chronic kidney disease, stage III (moderate)   . Unspecified vitamin D deficiency     Past Surgical History  Procedure Laterality Date  . Back surgery    . Neck surgery      Current Outpatient Prescriptions  Medication Sig Dispense Refill  . atorvastatin (LIPITOR) 10 MG tablet       . Cholecalciferol (VITAMIN D-3) 1000 UNITS CAPS Take 1 capsule by mouth daily.      . clopidogrel (PLAVIX) 75 MG tablet       . lisinopril (PRINIVIL,ZESTRIL) 10 MG tablet       . magnesium oxide (MAG-OX) 400 MG tablet Take 400 mg by mouth daily.      . metoprolol succinate (TOPROL-XL) 25 MG 24 hr tablet       . Multiple Vitamin (MULTIVITAMIN) tablet Take 1 tablet by mouth daily.      . Omega-3 Fatty Acids (FISH OIL) 300 MG CAPS Take 1 capsule by mouth daily.      . TRAVATAN Z 0.004 % SOLN ophthalmic solution        No current facility-administered medications for this visit.     Allergies as of 10/29/2012 - Review Complete 10/29/2012  Allergen Reaction Noted  . Aspirin      Family History  Problem Relation Age of Onset  . Lung cancer Brother   . Breast cancer Mother   . Lung cancer Father     History   Social History  . Marital Status: Single    Spouse Name: N/A    Number of Children: 2  . Years of Education: N/A   Occupational History  . Retired    Social History Main Topics  . Smoking status: Former Research scientist (life sciences)  . Smokeless tobacco: Never Used  . Alcohol Use: No  . Drug Use: No  . Sexual Activity: Not on file   Other Topics Concern  . Not on file   Social History Narrative  . No narrative on file       Physical Exam: BP 128/68  Pulse 68  Ht 5' (1.524 m)  Wt 167 lb (75.751 kg)  BMI 32.62 kg/m2 Constitutional: generally well-appearing Psychiatric: alert and oriented x3 Eyes: extraocular movements intact Mouth: oral pharynx moist, no lesions Neck: supple no lymphadenopathy Cardiovascular: heart regular rate and rhythm Lungs: clear to auscultation bilaterally Abdomen: soft, nontender, nondistended,  no obvious ascites, no peritoneal signs, normal bowel sounds Extremities: no lower extremity edema bilaterally Skin: no lesions on visible extremities    Assessment and plan: 77 y.o. female with  personal history of adenomatous polyps  She is in quite good health, gets around very well on her own. Her daughter says she gets around better than she does even. I think: Polyp surveillance is still a reasonable clinical concern for her. We will proceed with colonoscopy at her soonest convenience after communicating with her primary care physician about holding the Plavix for 5 days.

## 2012-11-05 ENCOUNTER — Telehealth: Payer: Self-pay | Admitting: Gastroenterology

## 2012-11-05 NOTE — Telephone Encounter (Signed)
Free movi voucher has been given to the pharmacy, pt aware anticoag letter refaxed to Dr [facility]

## 2012-11-06 ENCOUNTER — Telehealth: Payer: Self-pay

## 2012-11-06 NOTE — Telephone Encounter (Signed)
Letter received to hold plavix 5 days prior to procedure pt has been notified and letter scanned to [facility]

## 2012-11-19 ENCOUNTER — Ambulatory Visit (AMBULATORY_SURGERY_CENTER): Payer: Medicare Other | Admitting: Gastroenterology

## 2012-11-19 ENCOUNTER — Encounter: Payer: Self-pay | Admitting: Gastroenterology

## 2012-11-19 VITALS — BP 134/81 | HR 62 | Temp 96.9°F | Resp 14 | Ht 60.0 in | Wt 167.0 lb

## 2012-11-19 DIAGNOSIS — Z860100 Personal history of colon polyps, unspecified: Secondary | ICD-10-CM

## 2012-11-19 DIAGNOSIS — D126 Benign neoplasm of colon, unspecified: Secondary | ICD-10-CM | POA: Diagnosis not present

## 2012-11-19 DIAGNOSIS — K5730 Diverticulosis of large intestine without perforation or abscess without bleeding: Secondary | ICD-10-CM | POA: Diagnosis not present

## 2012-11-19 MED ORDER — SODIUM CHLORIDE 0.9 % IV SOLN: 500.0000 mL | INTRAVENOUS | Status: DC

## 2012-11-19 NOTE — Progress Notes (Signed)
When reviewing patient history she denies history of diabetes and CHF.  Have removed diabetes but left CHF to wait for clarification.

## 2012-11-19 NOTE — Patient Instructions (Signed)

## 2012-11-19 NOTE — Op Note (Signed)
[facility]  [address]. [place], [address]   COLONOSCOPY PROCEDURE REPORT  PATIENT: [name], [name]  MR#: [id number] BIRTHDATE: July 23, 1935 , 77  yrs. old GENDER: Female ENDOSCOPIST: Milus Banister, MD PROCEDURE DATE:  11/19/2012 PROCEDURE:   Colonoscopy with biopsy First Screening Colonoscopy - Avg.  risk and is 50 yrs.  old or older - No.  Prior Negative Screening - Now for repeat screening. N/A  History of Adenoma - Now for follow-up colonoscopy & has been > or = to 3 yrs.  Yes hx of adenoma.  Has been 3 or more years since last colonoscopy.  Polyps Removed Today? Yes. ASA CLASS:   Class II INDICATIONS:Three small adenomas removed 2011. MEDICATIONS: Fentanyl 50 mcg IV, Versed 7 mg IV, and These medications were titrated to patient response per physician's verbal order  DESCRIPTION OF PROCEDURE:   After the risks benefits and alternatives of the procedure were thoroughly explained, informed consent was obtained.  A digital rectal exam revealed no abnormalities of the rectum.   The LB [id number]-[id number]  endoscope was introduced through the anus and advanced to the cecum, which was identified by both the appendix and ileocecal valve. No adverse events experienced.   The quality of the prep was good.  The instrument was then slowly withdrawn as the colon was fully examined.  COLON FINDINGS: There were several hyperplastic appearing, small, flat polyps in rectosigmoid.  These were sampled with biopsy forceps.  There were diverticulum in the left colon.  The examination was otherwise normal.  Retroflexed views revealed no abnormalities. The time to cecum=4 minutes 20 seconds.  Withdrawal time=7 minutes 05 seconds.  The scope was withdrawn and the procedure completed. COMPLICATIONS: There were no complications.  ENDOSCOPIC IMPRESSION: There were several hyperplastic appearing, small, flat polyps in rectosigmoid.  These were sampled with biopsy forceps.   There were diverticulum in the left colon.  The examination was otherwise normal.  RECOMMENDATIONS: 1.  Given your age, you will probably not need another colonoscopy for colon cancer screening or polyp surveillance.  These types of tests usually stop around the age 25.  You will receive a letter within 1-2 weeks with the results of your biopsy as well as final recommendations.  Please call my office if you have not received a letter after 3 weeks.   eSigned:  Milus Banister, MD 11/19/2012 1:51 PM   cc: Glendale Chard, MD

## 2012-11-19 NOTE — Progress Notes (Signed)
Patient did not experience any of the following events: a burn prior to discharge; a fall within the facility; wrong site/side/patient/procedure/implant event; or a hospital transfer or hospital admission upon discharge from the facility. (G8907) Patient did not have preoperative order for IV antibiotic SSI prophylaxis. (G8918)  

## 2012-11-20 ENCOUNTER — Telehealth: Payer: Self-pay | Admitting: *Deleted

## 2012-11-20 NOTE — Telephone Encounter (Signed)
Message left

## 2012-11-27 ENCOUNTER — Telehealth: Payer: Self-pay | Admitting: Gastroenterology

## 2012-11-27 ENCOUNTER — Encounter: Payer: Self-pay | Admitting: Gastroenterology

## 2012-11-27 NOTE — Telephone Encounter (Signed)
Pt has been notified that her results have not been reviewed we will call her as soon as available

## 2013-02-19 DIAGNOSIS — D631 Anemia in chronic kidney disease: Secondary | ICD-10-CM | POA: Diagnosis not present

## 2013-02-19 DIAGNOSIS — N183 Chronic kidney disease, stage 3 unspecified: Secondary | ICD-10-CM | POA: Diagnosis not present

## 2013-02-19 DIAGNOSIS — N2581 Secondary hyperparathyroidism of renal origin: Secondary | ICD-10-CM | POA: Diagnosis not present

## 2013-02-24 ENCOUNTER — Telehealth: Payer: Self-pay | Admitting: Hematology and Oncology

## 2013-02-24 NOTE — Telephone Encounter (Signed)
S/W PATIENT DTR JO AND GAVE  NP APPT 02/04 @ 1:30 W/DR. GORSUCH REFERRING DR. [place]

## 2013-02-24 NOTE — Telephone Encounter (Signed)
C/D 02/24/13 for appt. 02/26/13

## 2013-02-26 ENCOUNTER — Encounter: Payer: Self-pay | Admitting: Hematology and Oncology

## 2013-02-26 ENCOUNTER — Telehealth: Payer: Self-pay | Admitting: Hematology and Oncology

## 2013-02-26 ENCOUNTER — Ambulatory Visit (HOSPITAL_BASED_OUTPATIENT_CLINIC_OR_DEPARTMENT_OTHER): Payer: Medicare Other | Admitting: Hematology and Oncology

## 2013-02-26 ENCOUNTER — Ambulatory Visit: Payer: Medicare Other

## 2013-02-26 ENCOUNTER — Ambulatory Visit (HOSPITAL_COMMUNITY)
Admission: RE | Admit: 2013-02-26 | Discharge: 2013-02-26 | Disposition: A | Discharge status: Home / Self Care | Payer: Medicare Other | Source: Ambulatory Visit | Attending: Hematology and Oncology | Admitting: Hematology and Oncology

## 2013-02-26 ENCOUNTER — Ambulatory Visit (HOSPITAL_BASED_OUTPATIENT_CLINIC_OR_DEPARTMENT_OTHER): Payer: Medicare Other

## 2013-02-26 VITALS — BP 154/75 | HR 74 | Temp 98.1°F | Resp 18 | Ht 60.75 in | Wt 170.0 lb

## 2013-02-26 DIAGNOSIS — R222 Localized swelling, mass and lump, trunk: Secondary | ICD-10-CM | POA: Diagnosis not present

## 2013-02-26 DIAGNOSIS — R05 Cough: Secondary | ICD-10-CM | POA: Insufficient documentation

## 2013-02-26 DIAGNOSIS — N183 Chronic kidney disease, stage 3 unspecified: Secondary | ICD-10-CM | POA: Diagnosis not present

## 2013-02-26 DIAGNOSIS — Z87891 Personal history of nicotine dependence: Secondary | ICD-10-CM | POA: Diagnosis not present

## 2013-02-26 DIAGNOSIS — E559 Vitamin D deficiency, unspecified: Secondary | ICD-10-CM

## 2013-02-26 DIAGNOSIS — D696 Thrombocytopenia, unspecified: Secondary | ICD-10-CM | POA: Diagnosis not present

## 2013-02-26 DIAGNOSIS — R059 Cough, unspecified: Secondary | ICD-10-CM | POA: Diagnosis not present

## 2013-02-26 HISTORY — DX: Thrombocytopenia, unspecified: D69.6

## 2013-02-26 HISTORY — DX: Cough, unspecified: R05.9

## 2013-02-26 LAB — CBC WITH DIFFERENTIAL/PLATELET
BASO%: 0.8 % (ref 0.0–2.0)
BASOS ABS: 0.1 10*3/uL (ref 0.0–0.1)
EOS ABS: 0.2 10*3/uL (ref 0.0–0.5)
EOS%: 2.7 % (ref 0.0–7.0)
HCT: 36.9 % (ref 34.8–46.6)
HEMOGLOBIN: 12.1 g/dL (ref 11.6–15.9)
LYMPH%: 21.2 % (ref 14.0–49.7)
MCH: 30.3 pg (ref 25.1–34.0)
MCHC: 32.8 g/dL (ref 31.5–36.0)
MCV: 92.5 fL (ref 79.5–101.0)
MONO#: 0.8 10*3/uL (ref 0.1–0.9)
MONO%: 12.3 % (ref 0.0–14.0)
NEUT%: 63 % (ref 38.4–76.8)
NEUTROS ABS: 4.1 10*3/uL (ref 1.5–6.5)
RBC: 3.99 10*6/uL (ref 3.70–5.45)
RDW: 14.9 % — ABNORMAL HIGH (ref 11.2–14.5)
WBC: 6.5 10*3/uL (ref 3.9–10.3)
lymph#: 1.4 10*3/uL (ref 0.9–3.3)

## 2013-02-26 LAB — COMPREHENSIVE METABOLIC PANEL (CC13)
ALBUMIN: 3.9 g/dL (ref 3.5–5.0)
ALK PHOS: 73 U/L (ref 40–150)
ALT: 23 U/L (ref 0–55)
AST: 24 U/L (ref 5–34)
Anion Gap: 9 mEq/L (ref 3–11)
BILIRUBIN TOTAL: 0.23 mg/dL (ref 0.20–1.20)
BUN: 11.8 mg/dL (ref 7.0–26.0)
CO2: 24 mEq/L (ref 22–29)
CREATININE: 1 mg/dL (ref 0.6–1.1)
Calcium: 9.7 mg/dL (ref 8.4–10.4)
Chloride: 110 mEq/L — ABNORMAL HIGH (ref 98–109)
GLUCOSE: 77 mg/dL (ref 70–140)
Potassium: 4.2 mEq/L (ref 3.5–5.1)
Sodium: 142 mEq/L (ref 136–145)
Total Protein: 6.9 g/dL (ref 6.4–8.3)

## 2013-02-26 LAB — CHCC SMEAR

## 2013-02-26 LAB — TECHNOLOGIST REVIEW

## 2013-02-26 NOTE — Progress Notes (Signed)
[facility] NOTE  Patient Care Team: Glendale Chard, MD as PCP - General (Internal Medicine)  CHIEF COMPLAINTS/PURPOSE OF CONSULTATION:  Severe thrombocytopenia  HISTORY OF PRESENTING ILLNESS:  [name] 78 y.o. female is here because of thrombocytopenia.  She was found to have abnormal CBC from routine CBC from a physician office. According to blood work dated 02/19/2013, CBC show white count of 6.4, hemoglobin 12.8 the platelet count of 37,000. Kidney and liver function tests were within normal limits. She denies recent bruising/bleeding, such as spontaneous epistaxis, hematuria, melena or hematochezia The patient denies history of liver disease, exposure to heparin, history of cardiac murmur/prior cardiovascular surgery or recent new medications. The patient takes Plavix long term for history of memory loss/TIA She denies prior blood or platelet transfusions She had history of neck surgery long time ago. She cannot remember by far she may have a benign goiter requiring radioactive iodine treatment in the past  MEDICAL HISTORY:  Past Medical History  Diagnosis Date  . Neurologic disorder     2009  . Unspecified vitamin D deficiency   . Benign hypertensive kidney disease with chronic kidney disease stage I through stage IV, or unspecified   . Chronic kidney disease, stage III (moderate)   . Arthritis   . Thrombocytopenia, unspecified 02/26/2013  . Cough 02/26/2013    SURGICAL HISTORY: Past Surgical History  Procedure Laterality Date  . Back surgery    . Neck surgery      SOCIAL HISTORY: History   Social History  . Marital Status: Single    Spouse Name: N/A    Number of Children: 2  . Years of Education: N/A   Occupational History  . Retired    Social History Main Topics  . Smoking status: Former Smoker -- 0.50 packs/day for 60 years    Quit date: 02/25/2012  . Smokeless tobacco: Never Used  . Alcohol Use: No  . Drug Use: No  . Sexual  Activity: Not on file   Other Topics Concern  . Not on file   Social History Narrative  . No narrative on file    FAMILY HISTORY: Family History  Problem Relation Age of Onset  . Lung cancer Brother   . Breast cancer Mother   . Lung cancer Father   . Rectal cancer Neg Hx   . Stomach cancer Neg Hx   . Esophageal cancer Neg Hx   . Colon cancer Neg Hx     ALLERGIES:  is allergic to aspirin.  MEDICATIONS:  Current Outpatient Prescriptions  Medication Sig Dispense Refill  . atorvastatin (LIPITOR) 10 MG tablet       . Cholecalciferol (VITAMIN D-3) 1000 UNITS CAPS Take 1 capsule by mouth daily.      . clopidogrel (PLAVIX) 75 MG tablet       . lisinopril (PRINIVIL,ZESTRIL) 10 MG tablet       . magnesium oxide (MAG-OX) 400 MG tablet Take 400 mg by mouth daily.      . metoprolol succinate (TOPROL-XL) 25 MG 24 hr tablet       . Multiple Vitamin (MULTIVITAMIN) tablet Take 1 tablet by mouth daily.      . Omega-3 Fatty Acids (FISH OIL) 300 MG CAPS Take 1 capsule by mouth daily.      . TRAVATAN Z 0.004 % SOLN ophthalmic solution       . ZOSTAVAX 65681 UNT/0.65ML injection once.       No current facility-administered medications for this visit.  REVIEW OF SYSTEMS:   Constitutional: Denies fevers, chills or abnormal night sweats Eyes: Denies blurriness of vision, double vision or watery eyes Ears, nose, mouth, throat, and face: Denies mucositis or sore throat Respiratory: She had chronic congestion and has been complaining of postnasal drip causing severe cough especially at nighttime for the past one year. She has nonproductive sputum. Cardiovascular: Denies palpitation, chest discomfort or lower extremity swelling Gastrointestinal:  Denies nausea, heartburn or change in bowel habits Skin: Denies abnormal skin rashes Lymphatics: Denies new lymphadenopathy or easy bruising Neurological:Denies numbness, tingling or new weaknesses Behavioral/Psych: Mood is stable, no new changes   All other systems were reviewed with the patient and are negative.  PHYSICAL EXAMINATION: ECOG PERFORMANCE STATUS: 1 - Symptomatic but completely ambulatory  Filed Vitals:   02/26/13 1413  BP: 154/75  Pulse: 74  Temp: 98.1 F (36.7 C)  Resp: 18   Filed Weights   02/26/13 1413  Weight: 170 lb (77.111 kg)    GENERAL:alert, no distress and comfortable. She is mildly obese SKIN: skin color, texture, turgor are normal, no rashes or significant lesions EYES: normal, conjunctiva are pink and non-injected, sclera clear OROPHARYNX:no exudate, no erythema and lips, buccal mucosa, and tongue normal  NECK: supple,without nodularity LYMPH:  no palpable lymphadenopathy in the cervical, axillary or inguinal LUNGS: clear to auscultation and percussion with normal breathing effort HEART: regular rate & rhythm and no murmurs and no lower extremity edema ABDOMEN:abdomen soft, non-tender and normal bowel sounds. Unable to appreciate hepatosplenomegaly Musculoskeletal:no cyanosis of digits and no clubbing  PSYCH: alert & oriented x 3 with fluent speech. She is hard of hearing NEURO: no focal motor/sensory deficits  LABORATORY DATA:  I have reviewed the data as listed ASSESSMENT:  Thrombocytopenia  PLAN #1 Thrombocytopenia Causes unknown. I will order some additional workup for this. #2 chronic cough The patient had history of smoking. She has been having chronic cough for long time. It is not clear whether she may have undiagnosed COPD with the cough could be related to lisinopril. I will order a chest x-ray for this.

## 2013-02-26 NOTE — Progress Notes (Signed)
Checked in new patient. They had gone to [facility]--not really late. No financial issues.

## 2013-02-26 NOTE — Telephone Encounter (Signed)
Gave pt appt fotr February 2015 sent pt to labs today

## 2013-02-27 ENCOUNTER — Other Ambulatory Visit: Payer: Self-pay | Admitting: Hematology and Oncology

## 2013-02-27 ENCOUNTER — Encounter: Payer: Self-pay | Admitting: Hematology and Oncology

## 2013-02-27 ENCOUNTER — Telehealth: Payer: Self-pay | Admitting: Hematology and Oncology

## 2013-02-27 DIAGNOSIS — R918 Other nonspecific abnormal finding of lung field: Secondary | ICD-10-CM | POA: Insufficient documentation

## 2013-02-27 DIAGNOSIS — H2510 Age-related nuclear cataract, unspecified eye: Secondary | ICD-10-CM | POA: Diagnosis not present

## 2013-02-27 DIAGNOSIS — H402290 Chronic angle-closure glaucoma, unspecified eye, stage unspecified: Secondary | ICD-10-CM | POA: Diagnosis not present

## 2013-02-27 DIAGNOSIS — R059 Cough, unspecified: Secondary | ICD-10-CM

## 2013-02-27 DIAGNOSIS — R05 Cough: Secondary | ICD-10-CM

## 2013-02-27 HISTORY — DX: Other nonspecific abnormal finding of lung field: R91.8

## 2013-02-27 NOTE — Telephone Encounter (Signed)
I reviewed the patient's chest x-ray dated yesterday. That is a lung mass on the left lung base, worrisome for lung cancer. This lung mass was not present on prior as chest x-ray from 2010. I called the patient's telephone number and spoke with her daughter and relayed the test results. I plan to order an urgent CT scan for evaluation.

## 2013-02-28 ENCOUNTER — Encounter (HOSPITAL_COMMUNITY): Payer: Self-pay

## 2013-02-28 ENCOUNTER — Ambulatory Visit (HOSPITAL_COMMUNITY)
Admission: RE | Admit: 2013-02-28 | Discharge: 2013-02-28 | Disposition: A | Discharge status: Home / Self Care | Payer: Medicare Other | Source: Ambulatory Visit | Attending: Hematology and Oncology | Admitting: Hematology and Oncology

## 2013-02-28 DIAGNOSIS — E079 Disorder of thyroid, unspecified: Secondary | ICD-10-CM | POA: Insufficient documentation

## 2013-02-28 DIAGNOSIS — R05 Cough: Secondary | ICD-10-CM

## 2013-02-28 DIAGNOSIS — R918 Other nonspecific abnormal finding of lung field: Secondary | ICD-10-CM

## 2013-02-28 DIAGNOSIS — R911 Solitary pulmonary nodule: Secondary | ICD-10-CM | POA: Diagnosis not present

## 2013-02-28 DIAGNOSIS — R059 Cough, unspecified: Secondary | ICD-10-CM

## 2013-02-28 HISTORY — DX: Essential (primary) hypertension: I10

## 2013-02-28 MED ORDER — IOHEXOL 300 MG/ML  SOLN
80.0000 mL | Freq: Once | INTRAMUSCULAR | Status: AC | PRN
  Administered 2013-02-28: 80 mL via INTRAVENOUS

## 2013-03-03 ENCOUNTER — Telehealth: Payer: Self-pay | Admitting: *Deleted

## 2013-03-03 ENCOUNTER — Telehealth: Payer: Self-pay | Admitting: Hematology and Oncology

## 2013-03-03 ENCOUNTER — Other Ambulatory Visit: Payer: Self-pay | Admitting: Hematology and Oncology

## 2013-03-03 DIAGNOSIS — R918 Other nonspecific abnormal finding of lung field: Secondary | ICD-10-CM

## 2013-03-03 NOTE — Telephone Encounter (Signed)
I discussed the CT result with Denice Paradise, the patient's daughter I reviewed the imaging study myself and felt that this is compatible with possible diagnosis of lung cancer. The reason the patient was referred to see me was thrombocytopenia which has resolved. I suspect it was a lab error I have cancelled her appointment to see me but placed a consult to see a pulmonologist for workup for suspicious lung cancer as soon as possible. She expressed understanding.

## 2013-03-03 NOTE — Telephone Encounter (Signed)
sw pt informed her that her appt w/ [facility] is cancel for 03/05/13. gv appt for Dr. Melvyn Novas on 03/05/13 @ 9:45am. Pt is aware...td

## 2013-03-05 ENCOUNTER — Ambulatory Visit: Payer: Medicare Other | Admitting: Hematology and Oncology

## 2013-03-05 ENCOUNTER — Ambulatory Visit (INDEPENDENT_AMBULATORY_CARE_PROVIDER_SITE_OTHER): Payer: Medicare Other | Admitting: Internal Medicine

## 2013-03-05 ENCOUNTER — Encounter: Payer: Self-pay | Admitting: Internal Medicine

## 2013-03-05 VITALS — BP 114/64 | HR 64 | Temp 98.7°F | Ht 60.0 in | Wt 171.2 lb

## 2013-03-05 DIAGNOSIS — R222 Localized swelling, mass and lump, trunk: Secondary | ICD-10-CM | POA: Diagnosis not present

## 2013-03-05 DIAGNOSIS — R05 Cough: Secondary | ICD-10-CM | POA: Diagnosis not present

## 2013-03-05 DIAGNOSIS — R059 Cough, unspecified: Secondary | ICD-10-CM | POA: Diagnosis not present

## 2013-03-05 DIAGNOSIS — I10 Essential (primary) hypertension: Secondary | ICD-10-CM | POA: Insufficient documentation

## 2013-03-05 DIAGNOSIS — R918 Other nonspecific abnormal finding of lung field: Secondary | ICD-10-CM

## 2013-03-05 MED ORDER — VALSARTAN 80 MG PO TABS: 80.0000 mg | ORAL_TABLET | Freq: Every day | ORAL | Status: DC

## 2013-03-05 NOTE — Assessment & Plan Note (Signed)
-   02/28/13 Ct 1. No acute findings.  2. Left lower lobe pulmonary nodule is worrisome for primary  pulmonary neoplasm. Recommend further assessment with PET-CT and  tissue sampling.  3. Borderline enlarged ipsilateral hilar lymph node  Most likely asymptomatic and if not for the acei cough might not have been discovered for another year.  Discussed in detail all the  indications, usual  risks and alternatives  relative to the benefits with patient who agrees to proceed with pet then excisional bx vs fob/bx depending on pet results.   See instructions for specific recommendations which were reviewed directly with the patient who was given a copy with highlighter outlining the key components.

## 2013-03-05 NOTE — Assessment & Plan Note (Signed)

## 2013-03-05 NOTE — Progress Notes (Signed)
Subjective:     Patient ID: [name], female   DOB: 05/28/1935   MRN: [id number]  HPI  17 yobf  Quit smoking winter of 2014 referred to pulmonary clinic   03/05/2013 by Dr Elson Areas for abnormal cxr done for w/u abn blood count in setting of chronic cough on acei.  03/05/2013 1st [facility] office visit/ Marygrace Sandoval cc dry mostly daytimecough x one year with sensation of congestion in throat almost like getting strangled s excess mucus and Not limited by breathing from desired activities - has some hb, no excess ant nasal drainage - cough worse on symbicort   No obvious patterns in day to day or daytime variabilty or assoc   cp or chest tightness, subjective wheeze overt sinus  symptoms. No unusual exp hx or h/o childhood pna/ asthma or knowledge of premature birth.  Sleeping ok without nocturnal  or early am exacerbation  of respiratory  c/o's or need for noct saba. Also denies any obvious fluctuation of symptoms with weather or environmental changes or other aggravating or alleviating factors except as outlined above   Current Medications, Allergies, Complete Past Medical History, Past Surgical History, Family History, and Social History were reviewed in [employer] record.  ROS  The following are not active complaints unless bolded sore throat, dysphagia, dental problems, itching, sneezing,  nasal congestion or excess/ purulent secretions, ear ache,   fever, chills, sweats, unintended wt loss, pleuritic or exertional cp, hemoptysis,  orthopnea pnd or leg swelling, presyncope, palpitations, heartburn, abdominal pain, anorexia, nausea, vomiting, diarrhea  or change in bowel or urinary habits, change in stools or urine, dysuria,hematuria,  rash, arthralgias, visual complaints, headache, numbness weakness or ataxia or problems with walking or coordination,  change in mood/affect or memory.          Review of Systems     Objective:   Physical Exam amb hoarse bf nad  Wt  Readings from Last 3 Encounters:  03/05/13 171 lb 3.2 oz (77.656 kg)  02/26/13 170 lb (77.111 kg)  11/19/12 167 lb (75.751 kg)     HEENT: nl dentition, turbinates, and orophanx. Nl external ear canals without cough reflex   NECK :  without JVD/Nodes/TM/ nl carotid upstrokes bilaterally   LUNGS: no acc muscle use, clear to A and P bilaterally without cough on insp or exp maneuvers   CV:  RRR  no s3 or murmur or increase in P2, no edema   ABD:  soft and nontender with nl excursion in the supine position. No bruits or organomegaly, bowel sounds nl  MS:  warm without deformities, calf tenderness, cyanosis or clubbing  SKIN: warm and dry without lesions    NEURO:  alert, approp, no deficits      Assessment:

## 2013-03-05 NOTE — Assessment & Plan Note (Addendum)
Most likely this is  Classic Upper airway cough syndrome, so named because it's frequently impossible to sort out how much is  CR/sinusitis with freq throat clearing (which can be related to primary GERD)   vs  causing  secondary (" extra esophageal")  GERD from wide swings in gastric pressure that occur with throat clearing, often  promoting self use of mint and menthol lozenges that reduce the lower esophageal sphincter tone and exacerbate the problem further in a cyclical fashion.   These are the same pts (now being labeled as having "irritable larynx syndrome" by some cough centers) who not infrequently have a history of having failed to tolerate ace inhibitors (as is likely the case here),  dry powder inhalers or biphosphonates or report having atypical reflux symptoms that don't respond to standard doses of PPI , and are easily confused as having aecopd or asthma flares by even experienced allergists/ pulmonologists.  For now try off acei while w/u for SPN in progress

## 2013-03-06 DIAGNOSIS — N183 Chronic kidney disease, stage 3 unspecified: Secondary | ICD-10-CM | POA: Diagnosis not present

## 2013-03-14 ENCOUNTER — Ambulatory Visit (HOSPITAL_COMMUNITY)
Admission: RE | Admit: 2013-03-14 | Discharge: 2013-03-14 | Disposition: A | Discharge status: Home / Self Care | Payer: Medicare Other | Source: Ambulatory Visit | Attending: Internal Medicine | Admitting: Internal Medicine

## 2013-03-14 DIAGNOSIS — R222 Localized swelling, mass and lump, trunk: Secondary | ICD-10-CM | POA: Diagnosis not present

## 2013-03-14 DIAGNOSIS — R911 Solitary pulmonary nodule: Secondary | ICD-10-CM | POA: Insufficient documentation

## 2013-03-14 DIAGNOSIS — E049 Nontoxic goiter, unspecified: Secondary | ICD-10-CM | POA: Insufficient documentation

## 2013-03-14 DIAGNOSIS — C3490 Malignant neoplasm of unspecified part of unspecified bronchus or lung: Secondary | ICD-10-CM | POA: Diagnosis not present

## 2013-03-14 DIAGNOSIS — R918 Other nonspecific abnormal finding of lung field: Secondary | ICD-10-CM

## 2013-03-14 LAB — GLUCOSE, CAPILLARY: GLUCOSE-CAPILLARY: 89 mg/dL (ref 70–99)

## 2013-03-14 MED ORDER — FLUDEOXYGLUCOSE F - 18 (FDG) INJECTION
9.7000 | Freq: Once | INTRAVENOUS | Status: AC | PRN
  Administered 2013-03-14: 9.7 via INTRAVENOUS

## 2013-03-15 ENCOUNTER — Encounter: Payer: Self-pay | Admitting: Internal Medicine

## 2013-03-17 ENCOUNTER — Telehealth: Payer: Self-pay | Admitting: Internal Medicine

## 2013-03-17 DIAGNOSIS — R918 Other nonspecific abnormal finding of lung field: Secondary | ICD-10-CM

## 2013-03-17 NOTE — Telephone Encounter (Signed)
Per MW - this was d/w the pt  Can close encounter

## 2013-03-17 NOTE — Telephone Encounter (Signed)
Discussed with daughter 

## 2013-03-17 NOTE — Telephone Encounter (Signed)
Spoke with the pt's daughter  I have scheduled appt for pt for PFT and rov with MW for 04/16/13 Daughter states pt could not really explain the results MW gave to her and wants him to call her b/c she has some questions  Please call her at [phone]

## 2013-03-20 ENCOUNTER — Ambulatory Visit: Payer: Medicare Other | Admitting: Internal Medicine

## 2013-03-28 ENCOUNTER — Encounter: Payer: Self-pay | Admitting: Internal Medicine

## 2013-03-28 ENCOUNTER — Ambulatory Visit (INDEPENDENT_AMBULATORY_CARE_PROVIDER_SITE_OTHER)
Admission: RE | Admit: 2013-03-28 | Discharge: 2013-03-28 | Disposition: A | Discharge status: Home / Self Care | Payer: Medicare Other | Source: Ambulatory Visit | Attending: Internal Medicine | Admitting: Internal Medicine

## 2013-03-28 ENCOUNTER — Ambulatory Visit (INDEPENDENT_AMBULATORY_CARE_PROVIDER_SITE_OTHER): Payer: Medicare Other | Admitting: Internal Medicine

## 2013-03-28 VITALS — BP 130/64 | HR 60 | Temp 97.5°F | Ht 60.25 in | Wt 175.2 lb

## 2013-03-28 DIAGNOSIS — R222 Localized swelling, mass and lump, trunk: Secondary | ICD-10-CM

## 2013-03-28 DIAGNOSIS — R05 Cough: Secondary | ICD-10-CM | POA: Diagnosis not present

## 2013-03-28 DIAGNOSIS — R059 Cough, unspecified: Secondary | ICD-10-CM

## 2013-03-28 DIAGNOSIS — R918 Other nonspecific abnormal finding of lung field: Secondary | ICD-10-CM

## 2013-03-28 DIAGNOSIS — I10 Essential (primary) hypertension: Secondary | ICD-10-CM | POA: Diagnosis not present

## 2013-03-28 NOTE — Progress Notes (Signed)
Subjective:     Patient ID: [name], female   DOB: 04/24/1935   MRN: [id number]   Brief patient profile:  53 yobf  Quit smoking winter of 2014 referred to pulmonary clinic   03/05/2013 by Dr Elson Areas for abnormal cxr done for w/u abn blood count in setting of chronic cough on acei.   History of Present Illness  03/05/2013 1st [facility] office visit/ Aviyanna Colbaugh cc dry mostly daytimecough x one year with sensation of congestion in throat almost like getting strangled s excess mucus and Not limited by breathing from desired activities - has some hb, no excess ant nasal drainage - cough worse on symbicort  rec Try off acei  Return for pfts   03/28/2013 f/u ov/Dartagnan Beavers re: off acei / no resp rx Chief Complaint  Patient presents with  . Follow-up    Spirometry performed. Pt reports cough is some better, but still bothersome.      Not limited by breathing from desired activities    No obvious patterns in day to day or daytime variabilty or assoc bloody or  excess mucus,   cp or chest tightness, subjective wheeze overt sinus  symptoms. No unusual exp hx or h/o childhood pna/ asthma or knowledge of premature birth.  Sleeping ok without nocturnal  or early am exacerbation  of respiratory  c/o's or need for noct saba. Also denies any obvious fluctuation of symptoms with weather or environmental changes or other aggravating or alleviating factors except as outlined above   Current Medications, Allergies, Complete Past Medical History, Past Surgical History, Family History, and Social History were reviewed in [employer] record.  ROS  The following are not active complaints unless bolded sore throat, dysphagia, dental problems, itching, sneezing,  nasal congestion or excess/ purulent secretions, ear ache,   fever, chills, sweats, unintended wt loss, pleuritic or exertional cp, hemoptysis,  orthopnea pnd or leg swelling, presyncope, palpitations, heartburn, abdominal pain,  anorexia, nausea, vomiting, diarrhea  or change in bowel or urinary habits, change in stools or urine, dysuria,hematuria,  rash, arthralgias, visual complaints, headache, numbness weakness or ataxia or problems with walking or coordination,  change in mood/affect or memory.               Objective:   Physical Exam   amb hoarse bf nad  03/28/2013         175  Wt Readings from Last 3 Encounters:  03/05/13 171 lb 3.2 oz (77.656 kg)  02/26/13 170 lb (77.111 kg)  11/19/12 167 lb (75.751 kg)     HEENT: nl dentition, turbinates, and orophanx. Nl external ear canals without cough reflex   NECK :  without JVD/Nodes/TM/ nl carotid upstrokes bilaterally   LUNGS: no acc muscle use, clear to A and P bilaterally without cough on insp or exp maneuvers   CV:  RRR  no s3 or murmur or increase in P2, no edema   ABD:  soft and nontender with nl excursion in the supine position. No bruits or organomegaly, bowel sounds nl  MS:  warm without deformities, calf tenderness, cyanosis or clubbing  SKIN: warm and dry without lesions    NEURO:  alert, approp, no deficits    CXR  03/28/2013 :   Persistent rounded airspace opacity in the left lower lobe which has  not significantly changed compared with 02/27/2012. Given the  persistence, this is concerning for malignancy until proven  otherwise.      Assessment:

## 2013-03-28 NOTE — Assessment & Plan Note (Signed)
-   02/28/13 Ct 1. No acute findings.  2. Left lower lobe pulmonary nodule is worrisome for primary  pulmonary neoplasm. Recommend further assessment with PET-CT and  tissue sampling.  3. Borderline enlarged ipsilateral hilar lymph node - PET 03/17/2013 > 3.8 cm hypermetabolic left lower lobe pulmonary mass consistent with neoplasm. No mediastinal or hilar adenopathy and no evidence for metastatic disease involving the neck, chest, abdomen or pelvis. No osseous metastasis.  Discussed in detail all the  indications, usual  risks and alternatives  relative to the benefits with patient who agrees to proceed with    - rec T surgery eval

## 2013-03-28 NOTE — Patient Instructions (Addendum)
Stop oil based vitamins for now  Please remember to go to the x-ray department downstairs for your tests - we will call you with the results when they are available.

## 2013-03-28 NOTE — Assessment & Plan Note (Signed)
Trial off acei 03/05/2013 > improved 03/28/2013  - spirometry 03/28/2013 FEV1  1.26 (85%) ratio 76 with slt concave f/v loop   She could have very mild copd/asthma but doubt it's contributing to her present sympoms which are most c/w upper airway cough  rec continue off acei, gerd diet reviewed

## 2013-03-28 NOTE — Progress Notes (Signed)
Quick Note:  Spoke with pt and notified of results per Dr. Wert. Pt verbalized understanding and denied any questions.  ______ 

## 2013-03-28 NOTE — Assessment & Plan Note (Signed)
Adequate control on present rx, reviewed > no change in rx needed   

## 2013-03-31 ENCOUNTER — Other Ambulatory Visit: Payer: Self-pay | Admitting: *Deleted

## 2013-03-31 ENCOUNTER — Encounter: Payer: Self-pay | Admitting: Cardiothoracic Surgery

## 2013-03-31 ENCOUNTER — Encounter (HOSPITAL_COMMUNITY): Payer: Self-pay | Admitting: Pharmacy Technician

## 2013-03-31 ENCOUNTER — Institutional Professional Consult (permissible substitution) (INDEPENDENT_AMBULATORY_CARE_PROVIDER_SITE_OTHER): Payer: Medicare Other | Admitting: Cardiothoracic Surgery

## 2013-03-31 VITALS — BP 146/64 | HR 66 | Resp 20 | Ht 60.0 in | Wt 175.0 lb

## 2013-03-31 DIAGNOSIS — R918 Other nonspecific abnormal finding of lung field: Secondary | ICD-10-CM

## 2013-03-31 DIAGNOSIS — R222 Localized swelling, mass and lump, trunk: Secondary | ICD-10-CM

## 2013-03-31 NOTE — Progress Notes (Signed)
[address]       [place] [address]             [phone]                    [name]  Medical Record #[id number] Date of Birth: 18-Sep-1935  Referring: Tanda Rockers, MD Primary Care: Maximino Greenland, MD  Chief Complaint:    Chief Complaint  Patient presents with  . Lung Mass    surgical eval on left lower lobe pulmonary mass, Chest CT 02/28/13, PET Scan 03/14/13    History of Present Illness:    [name] 78 y.o. female is referred for evaluation of left lower lobe lung mass. The patient had no specific symptoms referable to lung mass. She was referred to oncology because of low platelet count. (note says 37,000, patients daughter says was 137,000. She has been no Plavix since 2007 for an episode of "forgetting where she was". She denies any bleeding, but was noted to have iron deficiency anemia. Evaluation by oncology for her thrombocytopenia resulted in a chest x-ray with a left lower lobe lung mass which was not present on film 5 years earlier. A CT scan of the chest and PET scan have been performed and the patient is referred for consideration of resection of a left lower lobe hypermetabolic lung lesion highly suspicious for malignancy. The patient has been a smoker for many years she quit one year ago. She has some shortness of breath with significant exertion. She does live alone and cares for herself without difficulty, and notes she does her own yard work including cutting the grass with a push mower.   She's had no previous cardiac history, denies angina, denies symptoms of congestive heart failure. Screening PFTs have been performed include spirometry only.   She notes she was also referred for renal evaluation, Dr. Justin Mend.  Current Activity/ Functional Status:  Patient is independent with mobility/ambulation, transfers, ADL's, IADL's.   Zubrod Score: At the time of surgery this patient's most appropriate activity status/level should  be described as: []     0    Normal activity, no symptoms [x]     1    Restricted in physical strenuous activity but ambulatory, able to do out light work []     2    Ambulatory and capable of self care, unable to do work activities, up and about               >50 % of waking hours                              []     3    Only limited self care, in bed greater than 50% of waking hours []     4    Completely disabled, no self care, confined to bed or chair []     5    Moribund   Past Medical History  Diagnosis Date  . Neurologic disorder     2009  . Unspecified vitamin D deficiency   . Benign hypertensive kidney disease with chronic kidney disease stage I through stage IV, or unspecified   . Chronic kidney disease, stage III (moderate)   . Arthritis   . Thrombocytopenia, unspecified 02/26/2013  . Cough 02/26/2013  . Lung mass 02/27/2013  . Hypertension   . Asthma     Past Surgical History  Procedure Laterality  Date  . Back surgery    . Neck surgery      Family History  Problem Relation Age of Onset  . Lung cancer Brother     smoked  . Breast cancer Mother   . Lung cancer Father     smoked  . Rectal cancer Neg Hx   . Stomach cancer Neg Hx   . Esophageal cancer Neg Hx   . Colon cancer Neg Hx    Patient's son died of myocardial infarction at a young age  History   Social History  . Marital Status: Single    Spouse Name: N/A    Number of Children: 2  . Years of Education: N/A   Occupational History  . Retired    Social History Main Topics  . Smoking status: Former Smoker -- 0.50 packs/day for 60 years    Types: Cigarettes    Quit date: 02/25/2012  . Smokeless tobacco: Never Used  . Alcohol Use: No  . Drug Use: No  . Sexual Activity: Not on file   Other Topics Concern  . Not on file   Social History Narrative  . No narrative on file    History  Smoking status  . Former Smoker -- 0.50 packs/day for 60 years  . Types: Cigarettes  . Quit date: 02/25/2012    Smokeless tobacco  . Never Used    History  Alcohol Use No     Allergies  Allergen Reactions  . Aspirin     REACTION: stomach upset    Current Outpatient Prescriptions  Medication Sig Dispense Refill  . Cholecalciferol (VITAMIN D-3) 1000 UNITS CAPS Take 1 capsule by mouth daily.      . clopidogrel (PLAVIX) 75 MG tablet Take 75 mg by mouth daily with breakfast.       . magnesium oxide (MAG-OX) 400 MG tablet Take 400 mg by mouth daily.      . metoprolol succinate (TOPROL-XL) 25 MG 24 hr tablet Take 25 mg by mouth daily.       . Multiple Vitamin (MULTIVITAMIN) tablet Take 1 tablet by mouth daily.      . valsartan (DIOVAN) 80 MG tablet Take 1 tablet (80 mg total) by mouth daily.      Marland Kitchen lisinopril (PRINIVIL,ZESTRIL) 10 MG tablet Take 10 mg by mouth daily.       No current facility-administered medications for this visit.     Review of Systems:     Cardiac Review of Systems: Y or N  Chest Pain [ n   ]  Resting SOB [   ] Exertional SOB  [  ]  Orthopnea [  ]   Pedal Edema [ n  ]    Palpitations [ n ] Syncope  [n  ]   Presyncope [  n ]  General Review of Systems: [Y] = yes [  ]=no Constitional: recent weight change [  ];  Wt loss over the last 3 months [   ] anorexia [  ]; fatigue [  ]; nausea [  ]; night sweats [  ]; fever [  ]; or chills [  ];          Dental: poor dentition[  ]; Last Dentist visit:   Eye : blurred vision [  ]; diplopia [   ]; vision changes [  ];  Amaurosis fugax[  ]; Resp: cough [ some with ace ];  wheezing[ n ];  hemoptysis[  ]; shortness of breath[  ];  paroxysmal nocturnal dyspnea[ y ]; dyspnea on exertion[  ]; or orthopnea[  ];  GI:  gallstones[  ], vomiting[  ];  dysphagia[  ]; melena[  ];  hematochezia [  ]; heartburn[  ];   Hx of  Colonoscopy[ y ]; GU: kidney stones [  ]; hematuria[  ];   dysuria [  ];  nocturia[  ];  history of     obstruction [  ]; urinary frequency [  ]             Skin: rash, swelling[  ];, hair loss[  ];  peripheral edema[  ];  or  itching[  ]; Musculosketetal: myalgias[y  ];  joint swelling[  ];  joint erythema[  ];  joint pain[  ];  back pain[  ];  Heme/Lymph: bruising[  ];  bleeding[  ];  anemia[  ];  Neuro: TIA[ n ];  headaches[  n];  stroke[ ? ];  vertigo[  ];  seizures[ n ];   paresthesias[ n ];  difficulty walking[ n ];  Psych:depression[  ]; anxiety[  ];  Endocrine: diabetes[n  ];  thyroid dysfunction[  ];  Immunizations: Flu up to date [ y ]; Pneumococcal up to date [ y ];  Other:  Physical Exam: BP 146/64  Pulse 66  Resp 20  Ht 5' (1.524 m)  Wt 175 lb (79.379 kg)  BMI 34.18 kg/m2  SpO2 %  PHYSICAL EXAMINATION:  General appearance: alert, cooperative, appears stated age and no distress Neurologic: intact Heart: regular rate and rhythm, S1, S2 normal, no murmur, click, rub or gallop Lungs: clear to auscultation bilaterally Abdomen: soft, non-tender; bowel sounds normal; no masses,  no organomegaly Extremities: extremities normal, atraumatic, no cyanosis or edema and Homans sign is negative, no sign of DVT Patient has no carotid bruits, palpable DP and PT pulses bilaterally Patient has not had any cervical or supraclavicular or axillary adenopathy appreciated  Diagnostic Studies & Laboratory data:     Recent Radiology Findings:   Dg Chest 2 View  03/28/2013   CLINICAL DATA:  Cough  EXAM: CHEST  2 VIEW  COMPARISON:  NM PET IMAGE INITIAL (PI) SKULL BASE TO THIGH dated 03/14/2013; CT CHEST W/CM dated 02/28/2013; DG CHEST 2 VIEW dated 02/26/2013  FINDINGS: There is a rounded airspace opacity in the left lower lobe. There is no pleural effusion or pneumothorax. The heart and mediastinal contours are unremarkable.  There is evidence of prior anterior cervical disc fusion.  IMPRESSION: Persistent rounded airspace opacity in the left lower lobe which has not significantly changed compared with 02/27/2012. Given the persistence, this is concerning for malignancy until proven otherwise.   Electronically Signed   By:  Kathreen Devoid   On: 03/28/2013 14:05   Nm Pet Image Initial (pi) Skull Base To Thigh  03/14/2013   CLINICAL DATA:  Initial treatment strategy for lung mass.  EXAM: NUCLEAR MEDICINE PET SKULL BASE TO THIGH  FASTING BLOOD GLUCOSE:  Value: 89 mg/dl  TECHNIQUE: 9.7 mCi F-18 FDG was injected intravenously. Full-ring PET imaging was performed from the skull base to thigh after the radiotracer. CT data was obtained and used for attenuation correction and anatomic localization.  COMPARISON:  Chest CT 02/28/2013  FINDINGS: NECK  No hypermetabolic lymph nodes in the neck. Left thyroid goiter again noted.  CHEST  The 3.8 cm left lower lobe lung mass demonstrates marked FDG uptake with SUV max of 15.2. No metabolically active mediastinal or hilar lymph nodes. No other  pulmonary nodules are identified.  ABDOMEN/PELVIS  No abnormal hypermetabolic activity within the liver, pancreas, adrenal glands, or spleen. No hypermetabolic lymph nodes in the abdomen or pelvis.  SKELETON  No focal hypermetabolic activity to suggest skeletal metastasis.  IMPRESSION: 3.8 cm hypermetabolic left lower lobe pulmonary mass consistent with neoplasm. No mediastinal or hilar adenopathy and no evidence for metastatic disease involving the neck, chest, abdomen or pelvis. No osseous metastasis.   Electronically Signed   By: Kalman Jewels M.D.   On: 03/14/2013 15:00      Recent Lab Findings: Lab Results  Component Value Date   WBC 6.5 02/26/2013   HGB 12.1 02/26/2013   HCT 36.9 02/26/2013   PLT 294 Platelet count consistent in citrate 02/26/2013   GLUCOSE 77 02/26/2013   ALT 23 02/26/2013   AST 24 02/26/2013   NA 142 02/26/2013   K 4.2 02/26/2013   CL 110 08/06/2008   CREATININE 1.0 02/26/2013   BUN 11.8 02/26/2013   CO2 24 02/26/2013   INR 1.0 08/06/2008      Assessment / Plan:   New 3.8 cm hypermetabolic lesion in the left lower lobe, not noted on chest x-ray 5 years ago-highly suggestive of carcinoma the lung. History of thrombocytopenia, most  recent to platelet counts are over 200,000 Chronic Plavix therapy I reviewed the patient's films, the CT scan and PET scan are highly suggestive of a left lower lobe lung lesion. The patient's screening PFTs are reasonable. We'll obtain a full set of PFTs including diffusion capacity and if that adequate plan to proceed with left lower lobectomy and lymph node dissection. I discussed with the patient preoperative needle biopsy, however is negative radiographically lesion is very suggestive of malignancy and would proceed with resection even with a negative biopsy.  The risks and options are discussed with the patient and her daughter in detail and she is willing to proceed, she will stop her Plavix today, hold her Diovan 36 hours prior to surgery. We will obtain full pulmonary function studies and preoperative labs this week. We'll plan to proceed with bronchoscopy left video-assisted thoracoscopy probable lobectomy and lymph node dissection. The risks and options of surgical treatment of what appears to be stage 1 non-small cell lung cancer is reviewed with the patient and her daughter in detail.    We'll contact Dr. Jason Nest office for further notes, recent creatinine is not elevated.   I spent 55 minutes counseling the patient face to face. The total time spent in the appointment was 80 minutes.  [name] MD      [address] [place] [address] Office [phone]   Beeper [phone]  03/31/2013 2:25 PM

## 2013-03-31 NOTE — Patient Instructions (Signed)
Stop Plavix Stop Diovan  36 hours before surgery      Pulmonary Nodule A pulmonary nodule is a small, round growth of tissue in the lung. Pulmonary nodules can range in size from less than 1/5 inch (4 mm) to a little bigger than an inch (25 mm). Most pulmonary nodules are detected when imaging tests of the lung are being performed for a different problem. Pulmonary nodules are usually not cancerous (benign). However, some pulmonary nodules are cancerous (malignant). Follow-up treatment or testing is based on the size of the pulmonary nodule and your risk of getting lung cancer.  CAUSES Benign pulmonary nodules can be caused by various things. Some of the causes include:   Bacterial, fungal, or viral infections. This is usually an old infection that is no longer active, but it can sometimes be a current, active infection.  A benign mass of tissue.  Inflammation from conditions such as rheumatoid arthritis.   Abnormal blood vessels in the lungs. Malignant pulmonary nodules can result from lung cancer or from cancers that spread to the lung from other places in the body. SIGNS AND SYMPTOMS Pulmonary nodules usually do not cause symptoms. DIAGNOSIS Most often, pulmonary nodules are found incidentally when an X-ray or CT scan is performed to look for some other problem in the lung area. To help determine whether a pulmonary nodule is benign or malignant, your health care provider will take a medical history and order a variety of tests. Tests done may include:   Blood tests.  A skin test called a tuberculin test. This test is used to determine if you have been exposed to the germ that causes tuberculosis.   Chest X-rays. If possible, a new X-ray may be compared with X-rays you have had in the past.   CT scan. This test shows smaller pulmonary nodules more clearly than an X-ray.   Positron emission tomography (PET) scan. In this test, a safe amount of a radioactive substance is  injected into the bloodstream. Then, the scan takes a picture of the pulmonary nodule. The radioactive substance is eliminated from your body in your urine.   Biopsy. A tiny piece of the pulmonary nodule is removed so it can be checked under a microscope. TREATMENT  Pulmonary nodules that are benign normally do not require any treatment because they usually do not cause symptoms or breathing problems. Your health care provider may want to monitor the pulmonary nodule through follow-up CT scans. The frequency of these CT scans will vary based on the size of the nodule and the risk factors for lung cancer. For example, CT scans will need to be done more frequently if the pulmonary nodule is larger and if you have a history of smoking and a family history of cancer. Further testing or biopsies may be done if any follow-up CT scan shows that the size of the pulmonary nodule has increased. HOME CARE INSTRUCTIONS  Only take over-the-counter or prescription medicines as directed by your health care provider.  Keep all follow-up appointments with your health care provider. SEEK MEDICAL CARE IF:  You have trouble breathing when you are active.   You feel sick or unusually tired.   You do not feel like eating.   You lose weight without trying to.   You develop chills or night sweats.  SEEK IMMEDIATE MEDICAL CARE IF:  You cannot catch your breath, or you begin wheezing.   You cannot stop coughing.   You cough up blood.   You  become dizzy or feel like you are going to pass out.   You have sudden chest pain.   You have a fever or persistent symptoms for more than 2 3 days.   You have a fever and your symptoms suddenly get worse. MAKE SURE YOU:  Understand these instructions.  Will watch your condition.  Will get help right away if you are not doing well or get worse. Document Released: 11/06/2008 Document Revised: 09/11/2012 Document Reviewed: 07/01/2012 [facility] Patient  Information 2014 [place].  Lung Cancer Lung cancer is an abnormal growth of cells in one or both of your lungs. These extra cells may form a mass of tissue called a growth or tumor. Tumors can be either cancerous (malignant) or not cancerous (benign).  Lung cancer is the most common cause of cancer death in men and women. There are several different types of lung cancers. Usually, lung cancer is described as either small cell lung cancer or nonsmall cell lung cancer. Other types of cancer occur in the lungs, including carcinoid and cancers spread from other organs. The types of cancer have different behavior and treatment. RISK FACTORS Smoking is the most common risk factor for developing lung cancer. Other risk factors include:  Radon gas exposure.  Asbestos and other industrial substance exposure.  Second hand tobacco smoke.  Air pollution.  Family or personal history of lung cancer.  Age older than 66 years. CAUSES  Lung cancer usually starts when the lungs are exposed to harmful chemicals. Smoking is the most common risk factor for lung cancer. When you quit smoking, your risk of lung cancer falls each year (but is never the same as a person who has never smoked).  SYMPTOMS  Lung cancer may not have any symptoms in its early stages. The symptoms can depend on the type of cancer, its location, and other factors. Symptoms can include:  Cough (either new, different, or more severe).  Shortness of breath.  Coughing up blood (hemoptysis).  Chest pain.  Hoarseness.  Swelling of the face.  Drooping eyelid.  Changes in blood tests, such as low sodium (hyponatremia), high calcium (hypercalcemia), or low blood count (anemia).  Weight loss. DIAGNOSIS  Your health care provider may suspect lung cancer based on your symptoms or based on tests obtained for other reasons. Tests or procedures used to find or confirm the presence of lung cancer may include:  Chest X-ray.  CT  scan of the lungs and chest.  Blood tests.  Taking a tissue sample (biopsy) from your lung to look for cancer cells. Your cancer will be staged to determine its severity and extent. Staging is a careful attempt to find out the size of the tumor, whether the cancer has spread, and if so, to what parts of the body. You may need to have more tests to determine the stage of your cancer. The test results will help determine what treatment plan is best for you.   Stage 0 This is the earliest stage of lung cancer. In this stage the tumor is present in only a few layers of cells and has not grown beyond the inner lining of the lungs. Stage 0 (carcinoma in situ) is considered noninvasive, meaning at this stage it is not yet capable of spreading to other regions.  Stage I The cancer is located only in the lungs and not spread to any lymph nodes.  Stage II The cancer is in the lungs and the nearby lymph nodes.  Stage III The  cancer is in the lungs and the lymph nodes in the middle of the chest. This is also called locally advanced disease. This stage has two subtypes:  Stage IIIa  The cancer has spread only to lymph nodes on the same side of the chest where the cancer started.  Stage IIIb  The cancer has spread to lymph nodes on the opposite side of the chest or above the collar bone.  Stage IV This is the most advanced stage of lung cancer and is also called advanced disease. This stage describes when the cancer has spread to both lungs, the fluid in the area around the lungs, or to another body part. Your health care provider may tell you the detailed stage of your cancer, which includes both a number and a letter.  TREATMENT  Depending on the type and stage, lung cancer may be treated with surgery, radiation therapy, chemotherapy, or targeted therapy. Some people have a combination of these therapies. Your treatment plan will be developed by your health care team.  [place] not  smoke.  Only take over-the-counter or prescription medicines for pain, discomfort, or fever as directed by your health care provider.  Maintain a healthy diet.  Consider joining a support group. This may help you learn to cope with the stress of having lung cancer.  Seek advice to help you manage treatment side effects.  Keep all follow-up appointments as directed by your health care provider.  Inform your cancer specialist if you are admitted to the hospital. [place] IF:   You are losing weight without trying.  You have a persistent cough.  You feel short of breath.  You tire easily. SEEK IMMEDIATE MEDICAL CARE IF:   You cough up clotted blood or bright red blood.  Your pain is not manageable or controlled by medicine.  You develop new difficulty breathing or chest pain.  You develop swelling in one or both ankles or legs, or swelling in your face or neck.  You develop headache or confusion. Document Released: 04/17/2000 Document Revised: 10/30/2012 Document Reviewed: 08/28/2012 [facility] Patient Information 2014 [place], [place].  Lung Resection A lung resection is surgery to remove a lung. When an entire lung is removed, the procedure is called a pneumonectomy. When only part of a lung is removed, the procedure is called a lobectomy. A lung resection is typically done to get rid of a tumor or cancer. This surgery can help relieve some or all of your symptoms. The surgery can also help keep the problem from getting worse. It may provide the best chance for curing your disease. However, surgery may not necessarily cure lung cancer, if that is the problem. Most people need to stay in the hospital for several days after this procedure.  LET YOUR CAREGIVER KNOW ABOUT:  Allergies to food or medicine.  Medicines taken, including vitamins, herbs, eyedrops, over-the-counter medicines, and creams.  Use of steroids (by mouth or creams).  Previous problems with  anesthetics or numbing medicines.  History of bleeding problems or blood clots.  Previous surgery.  Other health problems, including diabetes and kidney problems.  Possibility of pregnancy, if this applies. RISKS AND COMPLICATIONS  Lung resections have been done for many years with good results and few complications. However, all surgery is associated with possible risks. Some of these risks are:  Excessive bleeding.  Infection.  Inability to breath without a ventilator.  Persistent shortness of breath.  Heart problems, including abnormal rhythms and  a risk of heart attack or heart failure.  Blood clots.  Injury to a blood vessel.  Injury to a nerve.  Failure to heal properly.  Stroke.  Bronchopleural fistula. This is a small hole between one of the main breathing tubes and the lining of the lungs. BEFORE THE PROCEDURE  In order to prepare for surgery, your caregiver may ask for several tests to be done. These may include:  Blood tests.  Urine tests.  X-rays.  Imaging tests, such as CT scans, MRI scans, and PET scans. These tests are done to find the exact size and location of the tumor that will be removed.  Pulmonary function tests (PFTs). These are breathing tests to assess the function of your lungs before surgery and to decide how to best help your breathing after surgery.  Heart testing. This is done to make sure your heart is strong enough for the procedure.  Bronchoscopy. This is a technique that allows your caregiver to look at the inside of your airways. This is done using a soft, flexible tube (bronchoscope). Along with imaging tests, this can help your caregiver know the exact location and size of the area that will be removed during surgery.  Lymph node sampling. This may need to be done to see if the tumor has spread. It may be done as a separate surgery or right before your lung resection procedure. PROCEDURE  An intravenous line (IV) will be placed  in your arm. You will be given medicine that makes you sleep (general anesthetic).  Once you are asleep, a breathing tube is placed into your windpipe. You may also get pain medicine through a thin, flexible tube (catheter) in your back. The catheter is put through your skin and next to your spinal cord, where it releases anesthetic medicine.  Next, you will be turned onto your side. This makes it easier for your surgeon to reach the area of your ribcage where the surgical cut (incision) will be made. This area is washed with a disinfectant solution and might also be shaved. A catheter will be put into your bladder to collect urine. Another tube will be carefully passed through your throat and into your stomach.  The surgeon will make an incision on your side, which will start between two of your ribs and go around to your back. Your ribs will be spread and held open. Part of one rib may be removed to make it easier for the surgeon to reach your lung.  Your surgeon will carefully cut the veins, arteries, and bronchus leading to the lung. After being cut, each of these pieces will be sewn or stapled closed. Then, the lung or part of the lung will be removed.  Your surgeon will check inside your chest to make sure there is no bleeding in or around the lungs. Lymph nodes near the lung may also be removed for later tests. This is done to check if your problems have spread to the lymph nodes.  Depending on your situation, your surgeon may put tubes into your chest to drain extra fluid and air from the chest cavity after surgery. After the tubes are in, your ribcage will be closed with stitches. The stitches help your ribcage heal and keep it from moving. After this, the layers of tissue under the skin are closed with more stitches, which will dissolve inside your body over time. Finally, your skin is closed with stitches or staples and covered with a bandage. AFTER THE PROCEDURE  After surgery, you will  be taken to the recovery area where a nurse will monitor your progress. You may still have a breathing tube, spinal catheter, bladder catheter, stomach tube, and possibly chest tubes inside your body. These will be removed during your recovery. You may be put on a respirator following surgery if some assistance is needed to help your breathing. When you are awake, stable, and without complications, you will likely continue recovery in the intensive care unit (ICU).  As you wake up, you might feel some aches and pains in your chest and throat. Sometimes during recovery, patients may shiver or feel nauseous. Both of these symptoms are temporary and may be caused by the anesthesia. Your caregivers can give you medicine to help these problems go away.  The breathing tube will be taken out as soon as your caregivers feel you can breathe on your own. For most people, this happens on the same day as the surgery.  If your surgery and time in the ICU go well, most of the tubes and equipment will be taken out within the first 1 to 2 days after surgery. This is about how long most people stay in the ICU. You may need to stay longer, depending on how you are doing.  You should also start respiratory therapy in the ICU. This therapy uses breathing exercises to help your other lung stay healthy and get stronger.  As you improve, you will be moved to a regular hospital room for continued respiratory therapy, help with your bladder and bowels, and to continue medicines. Most people stay in the hospital for 5 to 7 days. However, your stay may be longer, depending on how your surgery went and how well you are doing.  After your lung or part of your lung is taken out, there will be a space inside your chest. This space will often fill up with fluid over time. The amount of time this takes is different for each person. Because your chest needs to fill with fluid, your surgeon may or may not put a drainage tube in your  chest. If there is a chest tube, it will most likely be removed within 24 hours after the surgery.  You will receive care until you are doing well and your caregiver feels it is safe for you to go home or to transfer to an extended care facility. Document Released: 04/01/2002 Document Revised: 04/03/2011 Document Reviewed: 09/08/2010 [facility] Patient Information 2014 [place], [place]. Lung Resection Care After Refer to this sheet in the next few weeks. These instructions provide you with information on caring for yourself after your procedure. Your caregiver may also give you more specific instructions. Your treatment has been planned according to current medical practices, but problems sometimes occur. Call your caregiver if you have any problems or questions after your procedure. HOME CARE INSTRUCTIONS  You may resume a normal diet and activities as directed.  Do not smoke or use tobacco products.  Change your bandages (dressings) as directed.  Only take over-the-counter or prescription medicines for pain, discomfort, or fever as directed by your caregiver.  Keep all follow-up appointments as directed.  Try to breathe deeply and cough as directed. Holding a pillow firmly over your ribs may help with discomfort.  If you were given an incentive spirometer in the hospital, continue to use it as directed.  Walk as directed by your caregiver.  You may take a shower and gently wash the area of your surgical cut (incision)  with water and soap as directed. Do not use anything else to clean your incision except as directed by your caregiver. Do not take baths or sit in a hot tub. SEEK MEDICAL CARE IF:  You notice redness, swelling, or increasing pain in the incision.  You are bleeding from the incision.  You see pus coming from the incision.  You notice a bad smell coming from the incision or dressing.  Your incision breaks open.  You cough up blood or pus, or you develop a cough that  produces bad smelling sputum.  You have pain or swelling in your legs.  You have increasing pain that is not controlled with medicine.  You have trouble managing any of the tubes that have been left in place after surgery. SEEK IMMEDIATE MEDICAL CARE IF:   You have a fever or chills.  You have any reaction or side effects to medicines given.  You have chest pain or an irregular or rapid heartbeat.  You have dizzy episodes or fainting.  You have shortness of breath or difficulty breathing.  You have persistent nausea or vomiting.  You have a rash. MAKE SURE YOU:  Understand these instructions.  Will watch your condition.  Will get help right away if you are not doing well or get worse. Document Released: 07/29/2004 Document Revised: 04/03/2011 Document Reviewed: 09/08/2010 [facility] Patient Information 2014 [place], [place].

## 2013-04-03 ENCOUNTER — Encounter (HOSPITAL_COMMUNITY): Payer: Self-pay

## 2013-04-03 ENCOUNTER — Encounter (HOSPITAL_COMMUNITY)
Admission: RE | Admit: 2013-04-03 | Discharge: 2013-04-03 | Disposition: A | Discharge status: Home / Self Care | Payer: Medicare Other | Source: Ambulatory Visit | Attending: Cardiothoracic Surgery | Admitting: Cardiothoracic Surgery

## 2013-04-03 ENCOUNTER — Ambulatory Visit (HOSPITAL_COMMUNITY)
Admission: RE | Admit: 2013-04-03 | Discharge: 2013-04-03 | Disposition: A | Discharge status: Home / Self Care | Payer: Medicare Other | Source: Ambulatory Visit | Attending: Cardiothoracic Surgery | Admitting: Cardiothoracic Surgery

## 2013-04-03 VITALS — BP 144/84 | HR 66 | Temp 98.1°F | Resp 20 | Ht 60.0 in | Wt 172.2 lb

## 2013-04-03 DIAGNOSIS — R918 Other nonspecific abnormal finding of lung field: Secondary | ICD-10-CM

## 2013-04-03 DIAGNOSIS — R222 Localized swelling, mass and lump, trunk: Secondary | ICD-10-CM | POA: Diagnosis not present

## 2013-04-03 HISTORY — DX: Thyrotoxicosis, unspecified without thyrotoxic crisis or storm: E05.90

## 2013-04-03 HISTORY — DX: Shortness of breath: R06.02

## 2013-04-03 LAB — BLOOD GAS, ARTERIAL
Acid-base deficit: 1.9 mmol/L (ref 0.0–2.0)
Bicarbonate: 22.1 mEq/L (ref 20.0–24.0)
Drawn by: 206361
FIO2: 0.21 %
O2 Saturation: 96.5 %
Patient temperature: 98.6
TCO2: 23.2 mmol/L (ref 0–100)
pCO2 arterial: 36.3 mmHg (ref 35.0–45.0)
pH, Arterial: 7.402 (ref 7.350–7.450)
pO2, Arterial: 86.3 mmHg (ref 80.0–100.0)

## 2013-04-03 LAB — COMPREHENSIVE METABOLIC PANEL
ALT: 22 U/L (ref 0–35)
AST: 25 U/L (ref 0–37)
Albumin: 3.8 g/dL (ref 3.5–5.2)
Alkaline Phosphatase: 78 U/L (ref 39–117)
BUN: 13 mg/dL (ref 6–23)
CO2: 24 mEq/L (ref 19–32)
Calcium: 9.2 mg/dL (ref 8.4–10.5)
Chloride: 103 mEq/L (ref 96–112)
Creatinine, Ser: 1.1 mg/dL (ref 0.50–1.10)
GFR calc Af Amer: 55 mL/min — ABNORMAL LOW (ref >90)
GFR calc non Af Amer: 48 mL/min — ABNORMAL LOW (ref >90)
Glucose, Bld: 77 mg/dL (ref 70–99)
Potassium: 4.1 mEq/L (ref 3.7–5.3)
Sodium: 141 mEq/L (ref 137–147)
Total Bilirubin: 0.2 mg/dL — ABNORMAL LOW (ref 0.3–1.2)
Total Protein: 6.9 g/dL (ref 6.0–8.3)

## 2013-04-03 LAB — URINALYSIS, ROUTINE W REFLEX MICROSCOPIC
Bilirubin Urine: NEGATIVE
Glucose, UA: NEGATIVE mg/dL
Hgb urine dipstick: NEGATIVE
Ketones, ur: NEGATIVE mg/dL
Leukocytes, UA: NEGATIVE
Nitrite: NEGATIVE
Protein, ur: NEGATIVE mg/dL
Specific Gravity, Urine: 1.016 (ref 1.005–1.030)
Urobilinogen, UA: 0.2 mg/dL (ref 0.0–1.0)
pH: 6.5 (ref 5.0–8.0)

## 2013-04-03 LAB — SURGICAL PCR SCREEN
MRSA, PCR: NEGATIVE
STAPHYLOCOCCUS AUREUS: NEGATIVE

## 2013-04-03 LAB — CBC
HCT: 35.7 % — ABNORMAL LOW (ref 36.0–46.0)
Hemoglobin: 11.9 g/dL — ABNORMAL LOW (ref 12.0–15.0)
MCH: 30.7 pg (ref 26.0–34.0)
MCHC: 33.3 g/dL (ref 30.0–36.0)
MCV: 92 fL (ref 78.0–100.0)
Platelets: 281 10*3/uL (ref 150–400)
RBC: 3.88 MIL/uL (ref 3.87–5.11)
RDW: 14.6 % (ref 11.5–15.5)
WBC: 5.9 10*3/uL (ref 4.0–10.5)

## 2013-04-03 LAB — PROTIME-INR
INR: 0.94 (ref 0.00–1.49)
Prothrombin Time: 12.4 seconds (ref 11.6–15.2)

## 2013-04-03 LAB — TYPE AND SCREEN
ABO/RH(D): B POS
Antibody Screen: NEGATIVE

## 2013-04-03 LAB — APTT: aPTT: 28 seconds (ref 24–37)

## 2013-04-03 LAB — ABO/RH: ABO/RH(D): B POS

## 2013-04-03 MED ORDER — ALBUTEROL SULFATE (2.5 MG/3ML) 0.083% IN NEBU
2.5000 mg | INHALATION_SOLUTION | Freq: Once | RESPIRATORY_TRACT | Status: AC
  Administered 2013-04-03: 2.5 mg via RESPIRATORY_TRACT

## 2013-04-03 NOTE — Pre-Procedure Instructions (Signed)
[name]  04/03/2013   Your procedure is scheduled on:  Monday, March 16th  Report to Admitting at 0530 AM.  Call this number if you have problems the morning of surgery: [phone]   Remember:   Do not eat food or drink liquids after midnight.   Take these medicines the morning of surgery with A SIP OF WATER: toprol   Do not wear jewelry, make-up or nail polish.  Do not wear lotions, powders, or perfumes, deodorant.  Do not shave 48 hours prior to surgery. Men may shave face and neck.  Do not bring valuables to the hospital.  [facility] is not responsible  for any belongings or valuables.               Contacts, dentures or bridgework may not be worn into surgery.  Leave suitcase in the car. After surgery it may be brought to your room.  For patients admitted to the hospital, discharge time is determined by your   treatment team.  Please read over the following fact sheets that you were given: Pain Booklet, Coughing and Deep Breathing, Blood Transfusion Information, MRSA Information and Surgical Site Infection Prevention  [name] - Preparing for Surgery  Before surgery, you can play an important role.  Because skin is not sterile, your skin needs to be as free of germs as possible.  You can reduce the number of germs on you skin by washing with CHG (chlorahexidine gluconate) soap before surgery.  CHG is an antiseptic cleaner which kills germs and bonds with the skin to continue killing germs even after washing.  Please DO NOT use if you have an allergy to CHG or antibacterial soaps.  If your skin becomes reddened/irritated stop using the CHG and inform your nurse when you arrive at Short Stay.  Do not shave (including legs and underarms) for at least 48 hours prior to the first CHG shower.  You may shave your face.  Please follow these instructions carefully:   1.  Shower with CHG Soap the night before surgery and the morning of Surgery.  2.  If you choose to wash your  hair, wash your hair first as usual with your normal shampoo.  3.  After you shampoo, rinse your hair and body thoroughly to remove the shampoo.  4.  Use CHG as you would any other liquid soap.  You can apply CHG directly to the skin and wash gently with scrungie or a clean washcloth.  5.  Apply the CHG Soap to your body ONLY FROM THE NECK DOWN.  Do not use on open wounds or open sores.  Avoid contact with your eyes, ears, mouth and genitals (private parts).  Wash genitals (private parts) with your normal soap.  6.  Wash thoroughly, paying special attention to the area where your surgery will be performed.  7.  Thoroughly rinse your body with warm water from the neck down.  8.  DO NOT shower/wash with your normal soap after using and rinsing off the CHG Soap.  9.  Pat yourself dry with a clean towel.            10.  Wear clean pajamas.            11.  Place clean sheets on your bed the night of your first shower and do not sleep with pets.  Day of Surgery  Do not apply any lotions/deodorants the morning of surgery.  Please wear clean clothes to the hospital/surgery  center.

## 2013-04-03 NOTE — Progress Notes (Addendum)
Primary physician dr Shirlean Mylar sanders Cardiologist - dr. Einar Gip Stress test, ekg last year  Has already stopped her plavix per dr. Everrett Coombe instructions

## 2013-04-04 NOTE — Progress Notes (Signed)
Anesthesia chart review: Patient is a 78 year old female scheduled for video bronchoscopy, left VATS, lung resection by Dr. Servando Snare on 04/07/2013. She was recently referred to [facility] (Dr. Alvy Bimler) due to thrombocytopenia and cough. Workup included a chest x-ray that showed a left lower lobe lung mass this was confirmed by CT. The mass was hypermetabolic on CT.  Other history includes CKD stage III-IV, HTN, asthma, back and neck surgeries, former smoker, hyperthyroidism s/p radioactive iodine therapy '11, episode of "forgetting where she was" (?TIA) ~ 2007 that was treated with Plavix. PCP is Dr. Glendale Chard.  Cardiologist is Dr. Einar Gip.  Plavix is on hold for surgery. Pulmonologist is Dr. Melvyn Novas.  EKG on 04/03/13 showed SB with occasional PVC, septal infarct (age undetermined).  Exercise stress test on 03/28/2012 [facility]) was negative for ischemia. There was no echo at [facility].  Carotid duplex on 03/24/12 [facility]) showed no evidence of hemodynamically significant stenosis in the bilateral carotid bifurcation vessels. Mild bilateral intimal thickening.  PFTs in Epic for review. Preoperative labs noted. PLT count was WNL.  She is for a CXR on the day of surgery.  Further evaluation by her assigned anesthesiologist on the day of surgery, but if no acute changes then anticipate she can proceed as planned.  [facility] Short Stay Center/Anesthesiology Phone [phone] 04/04/2013 11:51 AM

## 2013-04-06 ENCOUNTER — Encounter (HOSPITAL_COMMUNITY): Payer: Self-pay | Admitting: Certified Registered Nurse Anesthetist

## 2013-04-06 MED ORDER — DEXTROSE 5 % IV SOLN
1.5000 g | INTRAVENOUS | Status: AC
  Administered 2013-04-07: 1.5 g via INTRAVENOUS
  Filled 2013-04-06: qty 1.5

## 2013-04-07 ENCOUNTER — Encounter (HOSPITAL_COMMUNITY)
Admission: RE | Disposition: A | Discharge status: Home / Self Care | Payer: Self-pay | Source: Ambulatory Visit | Attending: Cardiothoracic Surgery

## 2013-04-07 ENCOUNTER — Encounter (HOSPITAL_COMMUNITY): Payer: Self-pay | Admitting: *Deleted

## 2013-04-07 ENCOUNTER — Inpatient Hospital Stay (HOSPITAL_COMMUNITY): Payer: Medicare Other | Admitting: Certified Registered Nurse Anesthetist

## 2013-04-07 ENCOUNTER — Encounter: Payer: Self-pay | Admitting: Internal Medicine

## 2013-04-07 ENCOUNTER — Encounter (HOSPITAL_COMMUNITY): Payer: Medicare Other | Admitting: Vascular Surgery

## 2013-04-07 ENCOUNTER — Inpatient Hospital Stay (HOSPITAL_COMMUNITY): Payer: Medicare Other

## 2013-04-07 ENCOUNTER — Inpatient Hospital Stay (HOSPITAL_COMMUNITY)
Admission: RE | Admit: 2013-04-07 | Discharge: 2013-04-11 | DRG: 164 | Disposition: A | Discharge status: Home / Self Care | Payer: Medicare Other | Source: Ambulatory Visit | Attending: Cardiothoracic Surgery | Admitting: Cardiothoracic Surgery

## 2013-04-07 DIAGNOSIS — Z79899 Other long term (current) drug therapy: Secondary | ICD-10-CM

## 2013-04-07 DIAGNOSIS — C3430 Malignant neoplasm of lower lobe, unspecified bronchus or lung: Secondary | ICD-10-CM | POA: Diagnosis not present

## 2013-04-07 DIAGNOSIS — Z4682 Encounter for fitting and adjustment of non-vascular catheter: Secondary | ICD-10-CM | POA: Diagnosis not present

## 2013-04-07 DIAGNOSIS — I129 Hypertensive chronic kidney disease with stage 1 through stage 4 chronic kidney disease, or unspecified chronic kidney disease: Secondary | ICD-10-CM | POA: Diagnosis present

## 2013-04-07 DIAGNOSIS — Z87891 Personal history of nicotine dependence: Secondary | ICD-10-CM | POA: Diagnosis not present

## 2013-04-07 DIAGNOSIS — D62 Acute posthemorrhagic anemia: Secondary | ICD-10-CM | POA: Diagnosis not present

## 2013-04-07 DIAGNOSIS — N183 Chronic kidney disease, stage 3 unspecified: Secondary | ICD-10-CM | POA: Diagnosis present

## 2013-04-07 DIAGNOSIS — J90 Pleural effusion, not elsewhere classified: Secondary | ICD-10-CM | POA: Diagnosis not present

## 2013-04-07 DIAGNOSIS — Z85118 Personal history of other malignant neoplasm of bronchus and lung: Secondary | ICD-10-CM | POA: Diagnosis not present

## 2013-04-07 DIAGNOSIS — J45909 Unspecified asthma, uncomplicated: Secondary | ICD-10-CM | POA: Diagnosis present

## 2013-04-07 DIAGNOSIS — Z801 Family history of malignant neoplasm of trachea, bronchus and lung: Secondary | ICD-10-CM

## 2013-04-07 DIAGNOSIS — J9819 Other pulmonary collapse: Secondary | ICD-10-CM | POA: Diagnosis not present

## 2013-04-07 DIAGNOSIS — I10 Essential (primary) hypertension: Secondary | ICD-10-CM | POA: Diagnosis not present

## 2013-04-07 DIAGNOSIS — E0590 Thyrotoxicosis, unspecified without thyrotoxic crisis or storm: Secondary | ICD-10-CM | POA: Diagnosis present

## 2013-04-07 DIAGNOSIS — R222 Localized swelling, mass and lump, trunk: Secondary | ICD-10-CM | POA: Diagnosis not present

## 2013-04-07 DIAGNOSIS — J9383 Other pneumothorax: Secondary | ICD-10-CM | POA: Diagnosis not present

## 2013-04-07 DIAGNOSIS — Z01811 Encounter for preprocedural respiratory examination: Secondary | ICD-10-CM | POA: Diagnosis not present

## 2013-04-07 DIAGNOSIS — R0602 Shortness of breath: Secondary | ICD-10-CM | POA: Diagnosis not present

## 2013-04-07 DIAGNOSIS — R918 Other nonspecific abnormal finding of lung field: Secondary | ICD-10-CM | POA: Diagnosis present

## 2013-04-07 HISTORY — PX: LYMPH NODE DISSECTION: SHX5087

## 2013-04-07 HISTORY — PX: VIDEO ASSISTED THORACOSCOPY (VATS)/ LOBECTOMY: SHX6169

## 2013-04-07 HISTORY — PX: VIDEO BRONCHOSCOPY: SHX5072

## 2013-04-07 SURGERY — BRONCHOSCOPY, VIDEO-ASSISTED
Anesthesia: General | Site: Chest

## 2013-04-07 MED ORDER — GLYCOPYRROLATE 0.2 MG/ML IJ SOLN
INTRAMUSCULAR | Status: AC
  Filled 2013-04-07: qty 3

## 2013-04-07 MED ORDER — ACETAMINOPHEN 500 MG PO TABS
1000.0000 mg | ORAL_TABLET | Freq: Four times a day (QID) | ORAL | Status: AC
  Administered 2013-04-08: 1000 mg via ORAL
  Filled 2013-04-07 (×2): qty 2

## 2013-04-07 MED ORDER — ONDANSETRON HCL 4 MG/2ML IJ SOLN: 4.0000 mg | Freq: Four times a day (QID) | INTRAMUSCULAR | Status: DC | PRN

## 2013-04-07 MED ORDER — DEXTROSE 5 % IV SOLN
1.5000 g | Freq: Two times a day (BID) | INTRAVENOUS | Status: AC
  Administered 2013-04-07 – 2013-04-08 (×2): 1.5 g via INTRAVENOUS
  Filled 2013-04-07 (×2): qty 1.5

## 2013-04-07 MED ORDER — ROCURONIUM BROMIDE 50 MG/5ML IV SOLN
INTRAVENOUS | Status: AC
  Filled 2013-04-07: qty 1

## 2013-04-07 MED ORDER — ONE-DAILY MULTI VITAMINS PO TABS: 1.0000 | ORAL_TABLET | Freq: Every day | ORAL | Status: DC

## 2013-04-07 MED ORDER — HYDROMORPHONE HCL PF 1 MG/ML IJ SOLN: 0.2500 mg | INTRAMUSCULAR | Status: DC | PRN

## 2013-04-07 MED ORDER — LACTATED RINGERS IV SOLN
INTRAVENOUS | Status: DC | PRN
  Administered 2013-04-07: 07:00:00 via INTRAVENOUS

## 2013-04-07 MED ORDER — METOPROLOL TARTRATE 12.5 MG HALF TABLET
12.5000 mg | ORAL_TABLET | Freq: Once | ORAL | Status: AC
  Administered 2013-04-07: 12.5 mg via ORAL
  Filled 2013-04-07: qty 1

## 2013-04-07 MED ORDER — HEMOSTATIC AGENTS (NO CHARGE) OPTIME
TOPICAL | Status: DC | PRN
  Administered 2013-04-07: 1 via TOPICAL

## 2013-04-07 MED ORDER — OXYCODONE HCL 5 MG PO TABS: 5.0000 mg | ORAL_TABLET | ORAL | Status: AC | PRN

## 2013-04-07 MED ORDER — VITAMIN D3 25 MCG (1000 UNIT) PO TABS
1000.0000 [IU] | ORAL_TABLET | Freq: Every day | ORAL | Status: DC
  Administered 2013-04-08 – 2013-04-11 (×4): 1000 [IU] via ORAL
  Filled 2013-04-07 (×5): qty 1

## 2013-04-07 MED ORDER — BISACODYL 5 MG PO TBEC
10.0000 mg | DELAYED_RELEASE_TABLET | Freq: Every day | ORAL | Status: DC
  Administered 2013-04-08 – 2013-04-09 (×2): 10 mg via ORAL
  Filled 2013-04-07 (×2): qty 2

## 2013-04-07 MED ORDER — ONDANSETRON HCL 4 MG/2ML IJ SOLN: 4.0000 mg | Freq: Once | INTRAMUSCULAR | Status: DC | PRN

## 2013-04-07 MED ORDER — LIDOCAINE HCL (CARDIAC) 20 MG/ML IV SOLN
INTRAVENOUS | Status: DC | PRN
  Administered 2013-04-07: 80 mg via INTRAVENOUS

## 2013-04-07 MED ORDER — SENNOSIDES-DOCUSATE SODIUM 8.6-50 MG PO TABS
1.0000 | ORAL_TABLET | Freq: Every evening | ORAL | Status: DC | PRN
  Filled 2013-04-07: qty 1

## 2013-04-07 MED ORDER — MIDAZOLAM HCL 2 MG/2ML IJ SOLN
INTRAMUSCULAR | Status: AC
  Filled 2013-04-07: qty 2

## 2013-04-07 MED ORDER — GLYCOPYRROLATE 0.2 MG/ML IJ SOLN
INTRAMUSCULAR | Status: DC | PRN
  Administered 2013-04-07: .6 mg via INTRAVENOUS

## 2013-04-07 MED ORDER — LABETALOL HCL 5 MG/ML IV SOLN
10.0000 mg | INTRAVENOUS | Status: DC | PRN
  Filled 2013-04-07: qty 4

## 2013-04-07 MED ORDER — ROCURONIUM BROMIDE 100 MG/10ML IV SOLN
INTRAVENOUS | Status: DC | PRN
  Administered 2013-04-07: 50 mg via INTRAVENOUS
  Administered 2013-04-07 (×2): 10 mg via INTRAVENOUS

## 2013-04-07 MED ORDER — MAGNESIUM OXIDE 400 MG PO TABS
400.0000 mg | ORAL_TABLET | Freq: Every day | ORAL | Status: DC
  Administered 2013-04-08 – 2013-04-11 (×4): 400 mg via ORAL
  Filled 2013-04-07 (×5): qty 1

## 2013-04-07 MED ORDER — ONDANSETRON HCL 4 MG/2ML IJ SOLN
4.0000 mg | Freq: Four times a day (QID) | INTRAMUSCULAR | Status: DC | PRN
  Filled 2013-04-07: qty 2

## 2013-04-07 MED ORDER — NALOXONE HCL 0.4 MG/ML IJ SOLN
0.4000 mg | INTRAMUSCULAR | Status: DC | PRN
  Filled 2013-04-07: qty 1

## 2013-04-07 MED ORDER — LIDOCAINE HCL (CARDIAC) 20 MG/ML IV SOLN
INTRAVENOUS | Status: AC
  Filled 2013-04-07: qty 5

## 2013-04-07 MED ORDER — KCL IN DEXTROSE-NACL 20-5-0.45 MEQ/L-%-% IV SOLN
INTRAVENOUS | Status: DC
  Administered 2013-04-07 – 2013-04-08 (×2): via INTRAVENOUS
  Filled 2013-04-07 (×10): qty 1000

## 2013-04-07 MED ORDER — PROPOFOL 10 MG/ML IV BOLUS
INTRAVENOUS | Status: AC
  Filled 2013-04-07: qty 20

## 2013-04-07 MED ORDER — ONDANSETRON HCL 4 MG/2ML IJ SOLN
INTRAMUSCULAR | Status: DC | PRN
  Administered 2013-04-07: 4 mg via INTRAVENOUS

## 2013-04-07 MED ORDER — TRAVOPROST 0.004 % OP SOLN
1.0000 [drp] | Freq: Every day | OPHTHALMIC | Status: DC
  Administered 2013-04-07 – 2013-04-09 (×3): 1 [drp] via OPHTHALMIC
  Filled 2013-04-07: qty 0.1
  Filled 2013-04-07: qty 2.5

## 2013-04-07 MED ORDER — PHENYLEPHRINE HCL 10 MG/ML IJ SOLN
10.0000 mg | INTRAVENOUS | Status: DC | PRN
  Administered 2013-04-07: 15 ug/min via INTRAVENOUS

## 2013-04-07 MED ORDER — FENTANYL CITRATE 0.05 MG/ML IJ SOLN
INTRAMUSCULAR | Status: AC
  Filled 2013-04-07: qty 5

## 2013-04-07 MED ORDER — PROPOFOL 10 MG/ML IV BOLUS
INTRAVENOUS | Status: DC | PRN
  Administered 2013-04-07: 180 mg via INTRAVENOUS

## 2013-04-07 MED ORDER — ADULT MULTIVITAMIN W/MINERALS CH
1.0000 | ORAL_TABLET | Freq: Every day | ORAL | Status: DC
  Administered 2013-04-08 – 2013-04-11 (×4): 1 via ORAL
  Filled 2013-04-07 (×5): qty 1

## 2013-04-07 MED ORDER — DIPHENHYDRAMINE HCL 12.5 MG/5ML PO ELIX
12.5000 mg | ORAL_SOLUTION | Freq: Four times a day (QID) | ORAL | Status: DC | PRN
  Filled 2013-04-07: qty 5

## 2013-04-07 MED ORDER — FERROUS SULFATE 325 (65 FE) MG PO TABS
325.0000 mg | ORAL_TABLET | Freq: Every day | ORAL | Status: DC
  Administered 2013-04-08 – 2013-04-11 (×4): 325 mg via ORAL
  Filled 2013-04-07 (×7): qty 1

## 2013-04-07 MED ORDER — FENTANYL CITRATE 0.05 MG/ML IJ SOLN
INTRAMUSCULAR | Status: DC | PRN
  Administered 2013-04-07: 50 ug via INTRAVENOUS
  Administered 2013-04-07: 100 ug via INTRAVENOUS
  Administered 2013-04-07: 50 ug via INTRAVENOUS
  Administered 2013-04-07: 100 ug via INTRAVENOUS
  Administered 2013-04-07: 50 ug via INTRAVENOUS
  Administered 2013-04-07: 100 ug via INTRAVENOUS
  Administered 2013-04-07 (×3): 50 ug via INTRAVENOUS

## 2013-04-07 MED ORDER — 0.9 % SODIUM CHLORIDE (POUR BTL) OPTIME
TOPICAL | Status: DC | PRN
  Administered 2013-04-07: 3000 mL

## 2013-04-07 MED ORDER — BUPIVACAINE 0.5 % ON-Q PUMP SINGLE CATH 400 ML
400.0000 mL | INJECTION | Status: DC
  Filled 2013-04-07: qty 400

## 2013-04-07 MED ORDER — TRAMADOL HCL 50 MG PO TABS: 50.0000 mg | ORAL_TABLET | Freq: Four times a day (QID) | ORAL | Status: DC | PRN

## 2013-04-07 MED ORDER — METOPROLOL SUCCINATE ER 25 MG PO TB24
25.0000 mg | ORAL_TABLET | Freq: Every day | ORAL | Status: DC
  Administered 2013-04-08 – 2013-04-11 (×4): 25 mg via ORAL
  Filled 2013-04-07 (×4): qty 1

## 2013-04-07 MED ORDER — VITAMIN D-3 25 MCG (1000 UT) PO CAPS: 1.0000 | ORAL_CAPSULE | Freq: Every day | ORAL | Status: DC

## 2013-04-07 MED ORDER — ACETAMINOPHEN 160 MG/5ML PO SOLN: 1000.0000 mg | Freq: Four times a day (QID) | ORAL | Status: AC

## 2013-04-07 MED ORDER — LACTATED RINGERS IV SOLN
INTRAVENOUS | Status: DC | PRN
  Administered 2013-04-07 (×2): via INTRAVENOUS

## 2013-04-07 MED ORDER — MIDAZOLAM HCL 5 MG/5ML IJ SOLN
INTRAMUSCULAR | Status: DC | PRN
  Administered 2013-04-07 (×2): 1 mg via INTRAVENOUS

## 2013-04-07 MED ORDER — OXYCODONE-ACETAMINOPHEN 5-325 MG PO TABS: 1.0000 | ORAL_TABLET | ORAL | Status: DC | PRN

## 2013-04-07 MED ORDER — NEOSTIGMINE METHYLSULFATE 1 MG/ML IJ SOLN
INTRAMUSCULAR | Status: DC | PRN
  Administered 2013-04-07: 4 mg via INTRAVENOUS

## 2013-04-07 MED ORDER — SODIUM CHLORIDE 0.9 % IJ SOLN: 9.0000 mL | INTRAMUSCULAR | Status: DC | PRN

## 2013-04-07 MED ORDER — FENTANYL 10 MCG/ML IV SOLN
INTRAVENOUS | Status: DC
  Administered 2013-04-07: 12:00:00 via INTRAVENOUS
  Administered 2013-04-07: 110 ug via INTRAVENOUS
  Administered 2013-04-07: 60 ug via INTRAVENOUS
  Administered 2013-04-08 (×2): 20 ug via INTRAVENOUS
  Administered 2013-04-08: 30 ug via INTRAVENOUS
  Administered 2013-04-08: 20 ug via INTRAVENOUS
  Administered 2013-04-08: 40 ug via INTRAVENOUS
  Administered 2013-04-09: 10 ug via INTRAVENOUS
  Administered 2013-04-09 (×2): 40 ug via INTRAVENOUS
  Administered 2013-04-09: 20 ug via INTRAVENOUS
  Administered 2013-04-09 (×2): 10 ug via INTRAVENOUS
  Filled 2013-04-07: qty 50

## 2013-04-07 MED ORDER — NEOSTIGMINE METHYLSULFATE 1 MG/ML IJ SOLN
INTRAMUSCULAR | Status: AC
  Filled 2013-04-07: qty 10

## 2013-04-07 MED ORDER — ONDANSETRON HCL 4 MG/2ML IJ SOLN
INTRAMUSCULAR | Status: AC
  Filled 2013-04-07: qty 2

## 2013-04-07 MED ORDER — POTASSIUM CHLORIDE 10 MEQ/50ML IV SOLN
10.0000 meq | Freq: Every day | INTRAVENOUS | Status: DC | PRN
  Filled 2013-04-07: qty 50

## 2013-04-07 MED ORDER — DIPHENHYDRAMINE HCL 50 MG/ML IJ SOLN
12.5000 mg | Freq: Four times a day (QID) | INTRAMUSCULAR | Status: DC | PRN
  Filled 2013-04-07: qty 0.25

## 2013-04-07 SURGICAL SUPPLY — 97 items
ADH SKN CLS APL DERMABOND .7 (GAUZE/BANDAGES/DRESSINGS) ×2
APL SRG 22X2 LUM MLBL SLNT (VASCULAR PRODUCTS)
APL SRG 7X2 LUM MLBL SLNT (VASCULAR PRODUCTS)
APPLICATOR TIP COSEAL (VASCULAR PRODUCTS) IMPLANT
APPLICATOR TIP EXT COSEAL (VASCULAR PRODUCTS) IMPLANT
BLADE SURG 11 STRL SS (BLADE) ×1 IMPLANT
BRUSH CYTOL CELLEBRITY 1.5X140 (MISCELLANEOUS) IMPLANT
CANISTER SUCTION 2500CC (MISCELLANEOUS) ×3 IMPLANT
CATH KIT ON Q 5IN SLV (PAIN MANAGEMENT) ×1 IMPLANT
CATH THORACIC 28FR (CATHETERS) ×1 IMPLANT
CATH THORACIC 36FR (CATHETERS) IMPLANT
CATH THORACIC 36FR RT ANG (CATHETERS) IMPLANT
CLIP TI MEDIUM 6 (CLIP) ×3 IMPLANT
CONN ST 1/4X3/8  BEN (MISCELLANEOUS) ×1
CONN ST 1/4X3/8 BEN (MISCELLANEOUS) IMPLANT
CONN Y 3/8X3/8X3/8  BEN (MISCELLANEOUS) ×1
CONN Y 3/8X3/8X3/8 BEN (MISCELLANEOUS) IMPLANT
CONT SPEC 4OZ CLIKSEAL STRL BL (MISCELLANEOUS) ×11 IMPLANT
COVER TABLE BACK 60X90 (DRAPES) ×3 IMPLANT
DERMABOND ADVANCED (GAUZE/BANDAGES/DRESSINGS) ×1
DERMABOND ADVANCED .7 DNX12 (GAUZE/BANDAGES/DRESSINGS) IMPLANT
DRAIN CHANNEL 28F RND 3/8 FF (WOUND CARE) IMPLANT
DRAIN CHANNEL 32F RND 10.7 FF (WOUND CARE) IMPLANT
DRAPE LAPAROSCOPIC ABDOMINAL (DRAPES) ×3 IMPLANT
DRAPE WARM FLUID 44X44 (DRAPE) ×3 IMPLANT
DRILL BIT 5/64 (BIT) ×1 IMPLANT
DRILL BIT 7/64X5 (BIT) ×1 IMPLANT
ELECT BLADE 4.0 EZ CLEAN MEGAD (MISCELLANEOUS) ×3
ELECT REM PT RETURN 9FT ADLT (ELECTROSURGICAL) ×3
ELECTRODE BLDE 4.0 EZ CLN MEGD (MISCELLANEOUS) ×2 IMPLANT
ELECTRODE REM PT RTRN 9FT ADLT (ELECTROSURGICAL) ×2 IMPLANT
FORCEPS BIOP RJ4 1.8 (CUTTING FORCEPS) IMPLANT
GLOVE BIO SURGEON STRL SZ 6.5 (GLOVE) ×8 IMPLANT
GLOVE BIOGEL M STER SZ 6 (GLOVE) ×1 IMPLANT
GLOVE BIOGEL PI IND STRL 6.5 (GLOVE) IMPLANT
GLOVE BIOGEL PI INDICATOR 6.5 (GLOVE) ×5
GLOVE ECLIPSE 6.5 STRL STRAW (GLOVE) ×2 IMPLANT
GOWN STRL REUS W/ TWL LRG LVL3 (GOWN DISPOSABLE) ×8 IMPLANT
GOWN STRL REUS W/ TWL XL LVL3 (GOWN DISPOSABLE) IMPLANT
GOWN STRL REUS W/TWL LRG LVL3 (GOWN DISPOSABLE) ×15
GOWN STRL REUS W/TWL XL LVL3 (GOWN DISPOSABLE) ×3
HANDLE STAPLE ENDO GIA SHORT (STAPLE) ×1
KIT BASIN OR (CUSTOM PROCEDURE TRAY) ×3 IMPLANT
KIT ROOM TURNOVER OR (KITS) ×3 IMPLANT
KIT SUCTION CATH 14FR (SUCTIONS) ×3 IMPLANT
MARKER SKIN DUAL TIP RULER LAB (MISCELLANEOUS) ×3 IMPLANT
NDL BIOPSY TRANSBRONCH 21G (NEEDLE) IMPLANT
NEEDLE BIOPSY TRANSBRONCH 21G (NEEDLE) IMPLANT
NS IRRIG 1000ML POUR BTL (IV SOLUTION) ×7 IMPLANT
OIL SILICONE PENTAX (PARTS (SERVICE/REPAIRS)) ×3 IMPLANT
PACK CHEST (CUSTOM PROCEDURE TRAY) ×3 IMPLANT
PAD ARMBOARD 7.5X6 YLW CONV (MISCELLANEOUS) ×9 IMPLANT
PASSER SUT SWANSON 36MM LOOP (INSTRUMENTS) ×1 IMPLANT
RELOAD EGIA 45 MED/THCK PURPLE (STAPLE) ×2 IMPLANT
RELOAD EGIA BLACK ROTIC 45MM (STAPLE) ×3 IMPLANT
RELOAD EGIA TRIS TAN 45 CVD (STAPLE) ×9 IMPLANT
RELOAD STAPLE 45 BLK XTHK (STAPLE) IMPLANT
RELOAD STAPLE 45 TAN MED CVD (STAPLE) IMPLANT
SCISSORS LAP 5X35 DISP (ENDOMECHANICALS) IMPLANT
SEALANT PROGEL (MISCELLANEOUS) IMPLANT
SEALANT SURG COSEAL 4ML (VASCULAR PRODUCTS) ×1 IMPLANT
SEALANT SURG COSEAL 8ML (VASCULAR PRODUCTS) IMPLANT
SOLUTION ANTI FOG 6CC (MISCELLANEOUS) ×3 IMPLANT
SPONGE GAUZE 4X4 12PLY (GAUZE/BANDAGES/DRESSINGS) ×3 IMPLANT
SPONGE INTESTINAL PEANUT (DISPOSABLE) ×2 IMPLANT
STAPLER ENDO GIA 12 SHRT THIN (STAPLE) IMPLANT
STAPLER ENDO GIA 12MM SHORT (STAPLE) ×2 IMPLANT
SUT PROLENE 3 0 SH DA (SUTURE) IMPLANT
SUT PROLENE 4 0 RB 1 (SUTURE)
SUT PROLENE 4-0 RB1 .5 CRCL 36 (SUTURE) IMPLANT
SUT SILK  1 MH (SUTURE) ×2
SUT SILK 1 MH (SUTURE) ×4 IMPLANT
SUT SILK 2 0 SH (SUTURE) ×3 IMPLANT
SUT SILK 2 0SH CR/8 30 (SUTURE) IMPLANT
SUT SILK 3 0SH CR/8 30 (SUTURE) IMPLANT
SUT STEEL 1 (SUTURE) IMPLANT
SUT VIC AB 1 CTX 18 (SUTURE) ×1 IMPLANT
SUT VIC AB 1 CTX 36 (SUTURE) ×3
SUT VIC AB 1 CTX36XBRD ANBCTR (SUTURE) IMPLANT
SUT VIC AB 2-0 CTX 36 (SUTURE) ×1 IMPLANT
SUT VIC AB 2-0 UR6 27 (SUTURE) IMPLANT
SUT VIC AB 3-0 SH 8-18 (SUTURE) IMPLANT
SUT VIC AB 3-0 X1 27 (SUTURE) ×1 IMPLANT
SUT VICRYL 2 TP 1 (SUTURE) ×1 IMPLANT
SWAB COLLECTION DEVICE MRSA (MISCELLANEOUS) IMPLANT
SYR 20ML ECCENTRIC (SYRINGE) ×3 IMPLANT
SYSTEM SAHARA CHEST DRAIN ATS (WOUND CARE) ×3 IMPLANT
TAPE CLOTH SURG 4X10 WHT LF (GAUZE/BANDAGES/DRESSINGS) ×1 IMPLANT
TIP APPLICATOR SPRAY EXTEND 16 (VASCULAR PRODUCTS) IMPLANT
TOWEL OR 17X24 6PK STRL BLUE (TOWEL DISPOSABLE) ×6 IMPLANT
TOWEL OR 17X26 10 PK STRL BLUE (TOWEL DISPOSABLE) ×6 IMPLANT
TRAP SPECIMEN MUCOUS 40CC (MISCELLANEOUS) ×3 IMPLANT
TRAY FOLEY CATH 16FRSI W/METER (SET/KITS/TRAYS/PACK) ×1 IMPLANT
TUBE ANAEROBIC SPECIMEN COL (MISCELLANEOUS) IMPLANT
TUBE CONNECTING 12X1/4 (SUCTIONS) ×6 IMPLANT
TUNNELER SHEATH ON-Q 11GX8 DSP (PAIN MANAGEMENT) ×1 IMPLANT
WATER STERILE IRR 1000ML POUR (IV SOLUTION) ×6 IMPLANT

## 2013-04-07 NOTE — Transfer of Care (Signed)
Immediate Anesthesia Transfer of Care Note  Patient: [name]  Procedure(s) Performed: Procedure(s): VIDEO BRONCHOSCOPY (N/A) VIDEO ASSISTED THORACOSCOPY (VATS)/ LOBECTOMY; INSERTION OF ON-Q PUMP (Left) LYMPH NODE DISSECTION (Left)  Patient Location: PACU  Anesthesia Type:General  Level of Consciousness: awake, alert  and oriented  Airway & Oxygen Therapy: Patient Spontanous Breathing  Post-op Assessment: Report given to PACU RN  Post vital signs: Reviewed and stable  Complications: No apparent anesthesia complications

## 2013-04-07 NOTE — Progress Notes (Signed)
[facility] BRIEF SICU PROGRESS NOTE  Day of Surgery  S/P Procedure(s) (LRB): VIDEO BRONCHOSCOPY (N/A) VIDEO ASSISTED THORACOSCOPY (VATS)/ LOBECTOMY; INSERTION OF ON-Q PUMP (Left) LYMPH NODE DISSECTION (Left)   Awake and alert, good analgesia NSR w/ relatively high BP O2 sats 98% on 3 L/min Chest tube output low volume, thin serosanguinous - no air leak UOP adequate  Plan: Continue routine early postop.  May need additional Rx for HTN  OWEN,CLARENCE H 04/07/2013 6:51 PM

## 2013-04-07 NOTE — Anesthesia Preprocedure Evaluation (Addendum)
Anesthesia Evaluation  Patient identified by MRN, date of birth, ID band Patient awake    Reviewed: Allergy & Precautions, H&P , NPO status , Patient's Chart, lab work & pertinent test results  Airway Mallampati: II TM Distance: >3 FB Neck ROM: Full    Dental  (+) Partial Upper   Pulmonary asthma , former smoker,  breath sounds clear to auscultation        Cardiovascular hypertension, Rhythm:Regular Rate:Normal     Neuro/Psych    GI/Hepatic   Endo/Other  Hyperthyroidism   Renal/GU Renal disease     Musculoskeletal   Abdominal (+) + obese,   Peds  Hematology   Anesthesia Other Findings   Reproductive/Obstetrics                          Anesthesia Physical Anesthesia Plan  ASA: II  Anesthesia Plan: General   Post-op Pain Management:    Induction: Intravenous  Airway Management Planned: Double Lumen EBT  Additional Equipment: Arterial line and CVP  Intra-op Plan:   Post-operative Plan: Possible Post-op intubation/ventilation  Informed Consent: I have reviewed the patients History and Physical, chart, labs and discussed the procedure including the risks, benefits and alternatives for the proposed anesthesia with the patient or authorized representative who has indicated his/her understanding and acceptance.     Plan Discussed with: CRNA and Anesthesiologist  Anesthesia Plan Comments:        Anesthesia Quick Evaluation

## 2013-04-07 NOTE — H&P (Signed)
[name]       [place] [address]             [phone]                              [name] [facility] Medical Record #[id number] Date of Birth: 12-04-1935  Referring: Tanda Rockers, MD Primary Care: Maximino Greenland, MD  Chief Complaint:    Chief Complaint  Patient presents with  . Lung Mass    surgical eval on left lower lobe pulmonary mass, Chest CT 02/28/13, PET Scan 03/14/13    History of Present Illness:    [name] 78 y.o. female is referred for evaluation of left lower lobe lung mass. The patient had no specific symptoms referable to lung mass. She was referred to oncology because of low platelet count. (note says 37,000, patients daughter says was 137,000. She has been no Plavix since 2007 for an episode of "forgetting where she was". She denies any bleeding, but was noted to have iron deficiency anemia. Evaluation by oncology for her thrombocytopenia resulted in a chest x-ray with a left lower lobe lung mass which was not present on film 5 years earlier. A CT scan of the chest and PET scan have been performed and the patient is referred for consideration of resection of a left lower lobe hypermetabolic lung lesion highly suspicious for malignancy. The patient has been a smoker for many years she quit one year ago. She has some shortness of breath with significant exertion. She does live alone and cares for herself without difficulty, and notes she does her own yard work including cutting the grass with a push mower.   She's had no previous cardiac history, denies angina, denies symptoms of congestive heart failure. Screening PFTs have been performed include spirometry only.   She notes she was also referred for renal evaluation, Dr. Justin Mend.  Current Activity/ Functional Status:  Patient is independent with mobility/ambulation, transfers, ADL's, IADL's.   Zubrod Score: At the time of surgery this patient's most appropriate activity  status/level should be described as: []     0    Normal activity, no symptoms [x]     1    Restricted in physical strenuous activity but ambulatory, able to do out light work []     2    Ambulatory and capable of self care, unable to do work activities, up and about               >50 % of waking hours                              []     3    Only limited self care, in bed greater than 50% of waking hours []     4    Completely disabled, no self care, confined to bed or chair []     5    Moribund   Past Medical History  Diagnosis Date  . Neurologic disorder     2009  . Unspecified vitamin D deficiency   . Arthritis   . Thrombocytopenia, unspecified 02/26/2013  . Cough 02/26/2013  . Lung mass 02/27/2013  . Hypertension   . Asthma   . Shortness of breath     exertion  . Hyperthyroidism     took iodine treatment for this  . Benign hypertensive  kidney disease with chronic kidney disease stage I through stage IV, or unspecified     sees dr. Justin Mend again in april  . Chronic kidney disease, stage III (moderate)     Past Surgical History  Procedure Laterality Date  . Back surgery    . Neck surgery    . Eye surgery      cataract  . Eye surgery Left     open up tear duct    Family History  Problem Relation Age of Onset  . Lung cancer Brother     smoked  . Breast cancer Mother   . Lung cancer Father     smoked  . Rectal cancer Neg Hx   . Stomach cancer Neg Hx   . Esophageal cancer Neg Hx   . Colon cancer Neg Hx    Patient's son died of myocardial infarction at a young age  History   Social History  . Marital Status: Single    Spouse Name: N/A    Number of Children: 2  . Years of Education: N/A   Occupational History  . Retired    Social History Main Topics  . Smoking status: Former Smoker -- 0.50 packs/day for 60 years    Types: Cigarettes    Quit date: 02/25/2012  . Smokeless tobacco: Never Used  . Alcohol Use: No  . Drug Use: No  . Sexual Activity: Not on file       History  Smoking status  . Former Smoker -- 0.50 packs/day for 60 years  . Types: Cigarettes  . Quit date: 02/25/2012  Smokeless tobacco  . Never Used    History  Alcohol Use No     Allergies  Allergen Reactions  . Aspirin     REACTION: stomach upset  . Lisinopril     cough    Current Facility-Administered Medications  Medication Dose Route Frequency Provider Last Rate Last Dose  . cefUROXime (ZINACEF) 1.5 g in dextrose 5 % 50 mL IVPB  1.5 g Intravenous 60 min Pre-Op [name], MD         Review of Systems:     Cardiac Review of Systems: Y or N  Chest Pain [ n   ]  Resting SOB [   ] Exertional SOB  [  ]  Orthopnea [  ]   Pedal Edema [ n  ]    Palpitations [ n ] Syncope  [n  ]   Presyncope [  n ]  General Review of Systems: [Y] = yes [  ]=no Constitional: recent weight change [  ];  Wt loss over the last 3 months [   ] anorexia [  ]; fatigue [  ]; nausea [  ]; night sweats [  ]; fever [  ]; or chills [  ];          Dental: poor dentition[  ]; Last Dentist visit:   Eye : blurred vision [  ]; diplopia [   ]; vision changes [  ];  Amaurosis fugax[  ]; Resp: cough [ some with ace ];  wheezing[ n ];  hemoptysis[  ]; shortness of breath[  ]; paroxysmal nocturnal dyspnea[ y ]; dyspnea on exertion[  ]; or orthopnea[  ];  GI:  gallstones[  ], vomiting[  ];  dysphagia[  ]; melena[  ];  hematochezia [  ]; heartburn[  ];   Hx of  Colonoscopy[ y ]; GU: kidney stones [  ]; hematuria[  ];  dysuria [  ];  nocturia[  ];  history of     obstruction [  ]; urinary frequency [  ]             Skin: rash, swelling[  ];, hair loss[  ];  peripheral edema[  ];  or itching[  ]; Musculosketetal: myalgias[y  ];  joint swelling[  ];  joint erythema[  ];  joint pain[  ];  back pain[  ];  Heme/Lymph: bruising[  ];  bleeding[  ];  anemia[  ];  Neuro: TIA[ n ];  headaches[  n];  stroke[ ? ];  vertigo[  ];  seizures[ n ];   paresthesias[ n ];  difficulty walking[ n ];  Psych:depression[  ];  anxiety[  ];  Endocrine: diabetes[n  ];  thyroid dysfunction[  ];  Immunizations: Flu up to date [ y ]; Pneumococcal up to date [ y ];  Other:  Physical Exam: BP 168/67  Pulse 70  Temp(Src) 98.4 F (36.9 C) (Oral)  Resp 20  SpO2 97%  PHYSICAL EXAMINATION:  General appearance: alert, cooperative, appears stated age and no distress Neurologic: intact Heart: regular rate and rhythm, S1, S2 normal, no murmur, click, rub or gallop Lungs: clear to auscultation bilaterally Abdomen: soft, non-tender; bowel sounds normal; no masses,  no organomegaly Extremities: extremities normal, atraumatic, no cyanosis or edema and Homans sign is negative, no sign of DVT Patient has no carotid bruits, palpable DP and PT pulses bilaterally Patient has not had any cervical or supraclavicular or axillary adenopathy appreciated  Diagnostic Studies & Laboratory data:     Recent Radiology Findings:  Dg Chest 2 View  04/07/2013   CLINICAL DATA:  Preoperative film.  Patient for lung surgery.  EXAM: CHEST  2 VIEW  COMPARISON:  PA and lateral chest 04/17/2013.  FINDINGS: Left lower lobe pulmonary mass is again identified. The lungs are otherwise clear. Heart size is normal. No pneumothorax or pleural effusion.  IMPRESSION: No acute disease in patient with a left lower lobe pulmonary mass.   Electronically Signed   By: Inge Rise M.D.   On: 04/07/2013 06:28    Dg Chest 2 View  03/28/2013   CLINICAL DATA:  Cough  EXAM: CHEST  2 VIEW  COMPARISON:  NM PET IMAGE INITIAL (PI) SKULL BASE TO THIGH dated 03/14/2013; CT CHEST W/CM dated 02/28/2013; DG CHEST 2 VIEW dated 02/26/2013  FINDINGS: There is a rounded airspace opacity in the left lower lobe. There is no pleural effusion or pneumothorax. The heart and mediastinal contours are unremarkable.  There is evidence of prior anterior cervical disc fusion.  IMPRESSION: Persistent rounded airspace opacity in the left lower lobe which has not significantly changed compared with  02/27/2012. Given the persistence, this is concerning for malignancy until proven otherwise.   Electronically Signed   By: Kathreen Devoid   On: 03/28/2013 14:05   Nm Pet Image Initial (pi) Skull Base To Thigh  03/14/2013   CLINICAL DATA:  Initial treatment strategy for lung mass.  EXAM: NUCLEAR MEDICINE PET SKULL BASE TO THIGH  FASTING BLOOD GLUCOSE:  Value: 89 mg/dl  TECHNIQUE: 9.7 mCi F-18 FDG was injected intravenously. Full-ring PET imaging was performed from the skull base to thigh after the radiotracer. CT data was obtained and used for attenuation correction and anatomic localization.  COMPARISON:  Chest CT 02/28/2013  FINDINGS: NECK  No hypermetabolic lymph nodes in the neck. Left thyroid goiter again noted.  CHEST  The 3.8 cm left  lower lobe lung mass demonstrates marked FDG uptake with SUV max of 15.2. No metabolically active mediastinal or hilar lymph nodes. No other pulmonary nodules are identified.  ABDOMEN/PELVIS  No abnormal hypermetabolic activity within the liver, pancreas, adrenal glands, or spleen. No hypermetabolic lymph nodes in the abdomen or pelvis.  SKELETON  No focal hypermetabolic activity to suggest skeletal metastasis.  IMPRESSION: 3.8 cm hypermetabolic left lower lobe pulmonary mass consistent with neoplasm. No mediastinal or hilar adenopathy and no evidence for metastatic disease involving the neck, chest, abdomen or pelvis. No osseous metastasis.   Electronically Signed   By: Kalman Jewels M.D.   On: 03/14/2013 15:00      Recent Lab Findings: Lab Results  Component Value Date   WBC 5.9 04/03/2013   HGB 11.9* 04/03/2013   HCT 35.7* 04/03/2013   PLT 281 04/03/2013   GLUCOSE 77 04/03/2013   ALT 22 04/03/2013   AST 25 04/03/2013   NA 141 04/03/2013   K 4.1 04/03/2013   CL 103 04/03/2013   CREATININE 1.10 04/03/2013   BUN 13 04/03/2013   CO2 24 04/03/2013   INR 0.94 04/03/2013   PFT  FEV1 1.4 105 %  DLCO 7.98 44% patient able to take care of yard including pushing lawn mower with  out difficulity    Assessment / Plan:   New 3.8 cm hypermetabolic lesion in the left lower lobe, not noted on chest x-ray 5 years ago-highly suggestive of carcinoma the lung. History of thrombocytopenia, most recent to platelet counts are over 281,000 Chronic Plavix therapy held for past week I reviewed the patient's films, the CT scan and PET scan are highly suggestive of a left lower lobe lung lung cancer Stage I  The goals risks and alternatives of the planned surgical procedure Bronchoscopy, Left VATS and Lung resection has been discussed with the patient in detail. The risks of the procedure including death, infection, stroke, myocardial infarction, bleeding,prolonged air leak, pulmonary embolus , blood transfusion have all been discussed specifically.  I have quoted [name] a 5 % of perioperative mortality and a complication rate as high as 25 %. The patient's questions have been answered.[name] is willing  to proceed with the planned procedure.    [name] MD      [address] [place] [address] Office [phone]   Beeper [phone]  04/07/2013 7:16 AM

## 2013-04-07 NOTE — Brief Op Note (Addendum)
      [address]       [place] [address]             [phone]     04/07/2013  11:06 AM  PATIENT:  Barrie Dunker  78 y.o. female  PRE-OPERATIVE DIAGNOSIS:  LEFT LOWER LOBE LUNG MASS  POST-OPERATIVE DIAGNOSIS:  NON-SMALL CELL CARCINOMA, LEFT LOWER LOBE   PROCEDURE:   VIDEO BRONCHOSCOPY LEFT VIDEO ASSISTED THORACOSCOPY/MINI-THORACOTOMY  LEFT LOWER LOBECTOMY LYMPH NODE DISSECTION Placement of On Q  SURGEON:  Surgeon(s): [name], MD  ASSISTANT: Suzzanne Cloud, PA-C  ANESTHESIA:   general  SPECIMEN:  Source of Specimen:  left lower lobe, lymph nodes  DISPOSITION OF SPECIMEN:  Pathology  DRAINS: 78 Fr CT, 28 Blake drain  PATIENT CONDITION:  PACU - hemodynamically stable.

## 2013-04-07 NOTE — Anesthesia Postprocedure Evaluation (Signed)
  Anesthesia Post-op Note  Patient: [name]  Procedure(s) Performed: Procedure(s): VIDEO BRONCHOSCOPY (N/A) VIDEO ASSISTED THORACOSCOPY (VATS)/ LOBECTOMY; INSERTION OF ON-Q PUMP (Left) LYMPH NODE DISSECTION (Left)  Patient Location: PACU  Anesthesia Type:General  Level of Consciousness: awake, oriented, sedated and patient cooperative  Airway and Oxygen Therapy: Patient Spontanous Breathing  Post-op Pain: moderate  Post-op Assessment: Post-op Vital signs reviewed, Patient's Cardiovascular Status Stable, Respiratory Function Stable, Patent Airway, No signs of Nausea or vomiting and Pain level controlled  Post-op Vital Signs: stable  Complications: No apparent anesthesia complications

## 2013-04-07 NOTE — Preoperative (Signed)
Beta Blockers   Reason not to administer Beta Blockers:Not Applicable 

## 2013-04-08 ENCOUNTER — Encounter (HOSPITAL_COMMUNITY): Payer: Self-pay | Admitting: Cardiothoracic Surgery

## 2013-04-08 ENCOUNTER — Inpatient Hospital Stay (HOSPITAL_COMMUNITY): Payer: Medicare Other

## 2013-04-08 DIAGNOSIS — J9819 Other pulmonary collapse: Secondary | ICD-10-CM | POA: Diagnosis not present

## 2013-04-08 LAB — POCT I-STAT 3, ART BLOOD GAS (G3+)
Acid-Base Excess: 2 mmol/L (ref 0.0–2.0)
Bicarbonate: 25.9 mEq/L — ABNORMAL HIGH (ref 20.0–24.0)
O2 Saturation: 98 %
Patient temperature: 98.1
TCO2: 27 mmol/L (ref 0–100)
pCO2 arterial: 38.4 mmHg (ref 35.0–45.0)
pH, Arterial: 7.436 (ref 7.350–7.450)
pO2, Arterial: 102 mmHg — ABNORMAL HIGH (ref 80.0–100.0)

## 2013-04-08 LAB — BASIC METABOLIC PANEL
BUN: 8 mg/dL (ref 6–23)
CO2: 25 mEq/L (ref 19–32)
Calcium: 8.4 mg/dL (ref 8.4–10.5)
Chloride: 104 mEq/L (ref 96–112)
Creatinine, Ser: 1.06 mg/dL (ref 0.50–1.10)
GFR calc Af Amer: 58 mL/min — ABNORMAL LOW (ref >90)
GFR, EST NON AFRICAN AMERICAN: 50 mL/min — AB (ref >90)
Glucose, Bld: 143 mg/dL — ABNORMAL HIGH (ref 70–99)
POTASSIUM: 4.2 meq/L (ref 3.7–5.3)
SODIUM: 140 meq/L (ref 137–147)

## 2013-04-08 LAB — CBC
HCT: 31.5 % — ABNORMAL LOW (ref 36.0–46.0)
HEMOGLOBIN: 10.7 g/dL — AB (ref 12.0–15.0)
MCH: 31.2 pg (ref 26.0–34.0)
MCHC: 34 g/dL (ref 30.0–36.0)
MCV: 91.8 fL (ref 78.0–100.0)
Platelets: 261 10*3/uL (ref 150–400)
RBC: 3.43 MIL/uL — AB (ref 3.87–5.11)
RDW: 14.8 % (ref 11.5–15.5)
WBC: 9 10*3/uL (ref 4.0–10.5)

## 2013-04-08 MED ORDER — ENOXAPARIN SODIUM 30 MG/0.3ML ~~LOC~~ SOLN
30.0000 mg | SUBCUTANEOUS | Status: DC
  Administered 2013-04-08 – 2013-04-11 (×4): 30 mg via SUBCUTANEOUS
  Filled 2013-04-08 (×4): qty 0.3

## 2013-04-08 MED ORDER — SODIUM CHLORIDE 0.9 % IJ SOLN
3.0000 mL | Freq: Two times a day (BID) | INTRAMUSCULAR | Status: DC
  Administered 2013-04-08 – 2013-04-10 (×3): 3 mL via INTRAVENOUS

## 2013-04-08 MED ORDER — SODIUM CHLORIDE 0.9 % IJ SOLN
10.0000 mL | Freq: Two times a day (BID) | INTRAMUSCULAR | Status: DC
  Administered 2013-04-08: 13 mL
  Administered 2013-04-09: 10 mL
  Administered 2013-04-10: 20 mL

## 2013-04-08 MED ORDER — SODIUM CHLORIDE 0.9 % IJ SOLN: 3.0000 mL | INTRAMUSCULAR | Status: DC | PRN

## 2013-04-08 MED ORDER — SODIUM CHLORIDE 0.9 % IJ SOLN: 10.0000 mL | INTRAMUSCULAR | Status: DC | PRN

## 2013-04-08 NOTE — Progress Notes (Addendum)
Patient ID: [name], female   DOB: [birth year], 78 y.o.   MRN: [id number] [facility] DAILY ICU PROGRESS NOTE                   [address]            [name] [address]          [phone]   1 Day Post-Op Procedure(s) (LRB): VIDEO BRONCHOSCOPY (N/A) VIDEO ASSISTED THORACOSCOPY (VATS)/ LOBECTOMY; INSERTION OF ON-Q PUMP (Left) LYMPH NODE DISSECTION (Left)  Total Length of Stay:  LOS: 1 day   Subjective: Up to chair, good respi effort and good pain control, start ambulating   Objective: Vital signs in last 24 hours: Temp:  [97.8 F (36.6 C)-99.6 F (37.6 C)] 98.4 F (36.9 C) (03/17 0741) Pulse Rate:  [33-81] 64 (03/17 0700) Cardiac Rhythm:  [-] Normal sinus rhythm (03/17 0700) Resp:  [10-24] 16 (03/17 0700) BP: (106-196)/(45-99) 134/57 mmHg (03/17 0700) SpO2:  [88 %-100 %] 99 % (03/17 0700) Arterial Line BP: (110-186)/(63-118) 132/63 mmHg (03/17 0430) Weight:  [172 lb 2.9 oz (78.1 kg)-173 lb 8 oz (78.7 kg)] 173 lb 8 oz (78.7 kg) (03/17 0600)  Filed Weights   04/07/13 1400 04/08/13 0600  Weight: 172 lb 2.9 oz (78.1 kg) 173 lb 8 oz (78.7 kg)    Weight change:    Hemodynamic parameters for last 24 hours:    Intake/Output from previous day: 03/16 0701 - 03/17 0700 In: 3441.7 [P.O.:120; I.V.:3221.7; IV Piggyback:100] Out: 3299 [Urine:1735; Chest Tube:720]  Intake/Output this shift:    Current Meds: Scheduled Meds: . acetaminophen  1,000 mg Oral 4 times per day   Or  . acetaminophen (TYLENOL) oral liquid 160 mg/5 mL  1,000 mg Oral 4 times per day  . bisacodyl  10 mg Oral Daily  . cholecalciferol  1,000 Units Oral Daily  . fentaNYL   Intravenous 6 times per day  . ferrous sulfate  325 mg Oral Q breakfast  . magnesium oxide  400 mg Oral Daily  . metoprolol succinate  25 mg Oral Daily  . multivitamin with minerals  1 tablet Oral Daily  . travoprost (benzalkonium)  1 drop Both Eyes QHS   Continuous Infusions: . dextrose 5 % and 0.45 % NaCl with KCl  20 mEq/L 100 mL/hr at 04/07/13 2000   PRN Meds:.diphenhydrAMINE, diphenhydrAMINE, labetalol, naloxone, ondansetron (ZOFRAN) IV, ondansetron (ZOFRAN) IV, oxyCODONE, oxyCODONE-acetaminophen, potassium chloride, senna-docusate, sodium chloride, traMADol  General appearance: alert, cooperative and no distress Neurologic: intact Heart: regular rate and rhythm, S1, S2 normal, no murmur, click, rub or gallop Lungs: clear to auscultation bilaterally Abdomen: soft, non-tender; bowel sounds normal; no masses,  no organomegaly Extremities: extremities normal, atraumatic, no cyanosis or edema and Homans sign is negative, no sign of DVT Wound: no air leak  Lab Results: CBC: Recent Labs  04/08/13 0450  WBC 9.0  HGB 10.7*  HCT 31.5*  PLT 261   BMET:  Recent Labs  04/08/13 0450  NA 140  K 4.2  CL 104  CO2 25  GLUCOSE 143*  BUN 8  CREATININE 1.06  CALCIUM 8.4    PT/INR: No results found for this basename: LABPROT, INR,  in the last 72 hours Radiology: Dg Chest 2 View  04/07/2013   CLINICAL DATA:  Preoperative film.  Patient for lung surgery.  EXAM: CHEST  2 VIEW  COMPARISON:  PA and lateral chest 04/17/2013.  FINDINGS: Left lower lobe pulmonary mass is again identified. The lungs  are otherwise clear. Heart size is normal. No pneumothorax or pleural effusion.  IMPRESSION: No acute disease in patient with a left lower lobe pulmonary mass.   Electronically Signed   By: Inge Rise M.D.   On: 04/07/2013 06:28   Dg Chest Portable 1 View  04/07/2013   CLINICAL DATA:  Postop VATS, history small cell lung malignancy  EXAM: PORTABLE CHEST - 1 VIEW  COMPARISON:  DG CHEST 2 VIEW dated 04/07/2013  FINDINGS: The lung volumes are low. There is atelectasis at the left lung base. Two chest tubes are present on the left with their tips in the pulmonary apex. No significant pneumothorax or pleural effusion is present. There is a tubular structure at the left lung base which may reflect a small caliber  pleural space drain. The cardiac silhouette is top-normal in size. The pulmonary vascularity is not engorged. The right internal jugular venous catheter has its tip at the junction of the SVC with the right atrium.  IMPRESSION: There is bilateral hypoinflation and mild subsegmental atelectasis at the left lung base. No significant pleural effusion or pneumothorax on the left is demonstrated. There is no evidence of CHF   Electronically Signed   By: David  Martinique   On: 04/07/2013 13:53     Assessment/Plan: S/P Procedure(s) (LRB): VIDEO BRONCHOSCOPY (N/A) VIDEO ASSISTED THORACOSCOPY (VATS)/ LOBECTOMY; INSERTION OF ON-Q PUMP (Left) LYMPH NODE DISSECTION (Left) Mobilize d/c tubes/lines Ct to water seal CR stable  PLT 261,000 Expected Acute  Blood - loss Anemia likely resume plavix tomorrow, was on for ? TIA   Odes Lolli B 04/08/2013 8:11 AM

## 2013-04-08 NOTE — Plan of Care (Signed)
Problem: Phase I Progression Outcomes Goal: Initial discharge plan identified Outcome: Progressing Daughter, Denice Paradise, will be staying with patient when she goes home. Her daughter used to do private duty CNA previously.

## 2013-04-08 NOTE — Progress Notes (Signed)
POD # 1 Left lower lobectomy  BP 124/93  Pulse 78  Temp(Src) 99.1 F (37.3 C) (Oral)  Resp 23  Ht 5' (1.524 m)  Wt 173 lb 8 oz (78.7 kg)  BMI 33.88 kg/m2  SpO2 97%   Intake/Output Summary (Last 24 hours) at 04/08/13 1751 Last data filed at 04/08/13 1700  Gross per 24 hour  Intake 3864.67 ml  Output   3131 ml  Net 733.67 ml    Up in chair  Stable POD # 1

## 2013-04-08 NOTE — Op Note (Signed)
NAMETONAE, LIVOLSI NO.:  [id number]  MEDICAL RECORD NO.:  [id number]  LOCATION:  2S13C                        FACILITY:  [facility]  PHYSICIAN:  Lanelle Bal, MD    DATE OF BIRTH:  July 17, 1935  DATE OF PROCEDURE:  04/07/2013                               OPERATIVE REPORT Preop diagnosis: Left lower lobe lung mass suspicious for primary carcinoma the lung Postop diagnosis: Same, non-small cell lung cancer probably adenocarcinoma by frozen section  Procedure: Bronchoscopy Left video-assisted thoracoscopy Left lower lobectomy and lymph node dissection Placement of On-Q device  Surgeon: [name] MD First Asst:  Suzzanne Cloud PA  Brief History: Patient is a 78 year old female who no specific symptoms referable to lung mass. She was referred to oncology because of low platelet count. (note says 37,000, patients daughter says was 137,000). She has been on  Plavix since 2007 for an episode of "forgetting where she was". She denies any bleeding, but was noted to have iron deficiency anemia. Evaluation by oncology for her thrombocytopenia resulted in a chest x-ray with a left lower lobe lung mass which was not present on film 5 years earlier. A CT scan of the chest and PET scan have been performed and the patient is referred for consideration of resection of a left lower lobe hypermetabolic lung lesion highly suspicious for malignancy. The patient has been a smoker for many years she quit one year ago.  Risks and options for surgical resection of what clinically appears to be a stage I carcinoma the lung was discussed with the patient and she was agreeable with proceeding with surgical resection .  Procedure: After appropriate timeout was performed the patient underwent general endotracheal anesthesia through. Through a single-lumen endotracheal tube the fiberoptic bronchoscope was placed to the subsegmental level both in the left and right tracheobronchial tree without  evidence of endobronchial lesion. The scope was then removed and a double-lumen endotracheal tube was placed by anesthesia patient was turned in the lateral decubitus position with the left side up .the skin of the left chest was prepped with Betadine draped in sterile manner . A second time out was performed , the patient's previously placed skin marking was visible . A 30-degree video thoracoscope was introduced into the left chest. There was no evidence of pleural spread of tumor.  An additional port incision and a small utilitarian incision were then created through these 2 ports sites on the incision.  We proceeded to identify the site of the lesion which was confirmed to be in the left lower lobe.  The inferior pulmonary ligament was first divided up to the inferior pulmonary vein.  An #8 lymph nodes were submitted to Pathology separately.  In the fissure, the pulmonary artery branches to the superior segment and the lower lobe were identified and circled and divided with a Gold tip vascular stapler.  The inferior pulmonary vein was then also divided.  The fissure was completed with a purple stapler. The left lower lobe bronchus was then clamped with a black load stapler. The left lung was inflated with filling of the upper lobe, but not to lower lobe.  The bronchus was divided.  Bronchial stump was tested for air leak without any evidence of air leak.  We then proceeded with further lymph node dissection in 10 L, 12 L and 11 L areas, #8 and 4 L lymph nodes were all separately placed and submitted to Pathology.  The bronchial stump frozen section was clear of tumor.  The mass in the left lower lobe was confirmed to be non-small cell carcinoma, probably adeno. An On-Q device was tunneled to a separate site posteriorly under guidance with the video scope.  Catheter was placed subpleurally for postoperative pain control.  A Blake drain was placed through the posterior port site and 28 chest  tube was placed through the anterior port site.  The incision was closed with pericostal sutures with the lower rib being drilled.  The left upper lobe was inflated and filled the space well.  The muscle layers were closed with interrupted 0 Vicryl, running 2-0 Vicryl, subcutaneous tissue, and 3-0 subcuticular stitch in skin edges.  Dermabond dressing was applied.  The patient was awakened in the operating room, having tolerated the procedure without obvious complication.  Estimated blood loss approximately 200 mL.  The patient did not require any blood bank blood products.  The sponge and needle count was reported as correct at the completion of the procedure.     Lanelle Bal, MD     EG/MEDQ  D:  04/07/2013  T:  04/08/2013  Job:  [id number]

## 2013-04-09 ENCOUNTER — Inpatient Hospital Stay (HOSPITAL_COMMUNITY): Payer: Medicare Other

## 2013-04-09 DIAGNOSIS — J9819 Other pulmonary collapse: Secondary | ICD-10-CM | POA: Diagnosis not present

## 2013-04-09 LAB — CBC
HEMATOCRIT: 34.1 % — AB (ref 36.0–46.0)
Hemoglobin: 11.4 g/dL — ABNORMAL LOW (ref 12.0–15.0)
MCH: 31.2 pg (ref 26.0–34.0)
MCHC: 33.4 g/dL (ref 30.0–36.0)
MCV: 93.4 fL (ref 78.0–100.0)
Platelets: 255 10*3/uL (ref 150–400)
RBC: 3.65 MIL/uL — ABNORMAL LOW (ref 3.87–5.11)
RDW: 14.7 % (ref 11.5–15.5)
WBC: 8.4 10*3/uL (ref 4.0–10.5)

## 2013-04-09 LAB — COMPREHENSIVE METABOLIC PANEL
ALK PHOS: 71 U/L (ref 39–117)
ALT: 16 U/L (ref 0–35)
AST: 26 U/L (ref 0–37)
Albumin: 2.9 g/dL — ABNORMAL LOW (ref 3.5–5.2)
BILIRUBIN TOTAL: 0.3 mg/dL (ref 0.3–1.2)
BUN: 8 mg/dL (ref 6–23)
CHLORIDE: 106 meq/L (ref 96–112)
CO2: 22 mEq/L (ref 19–32)
CREATININE: 1.01 mg/dL (ref 0.50–1.10)
Calcium: 8.4 mg/dL (ref 8.4–10.5)
GFR calc Af Amer: 62 mL/min — ABNORMAL LOW (ref >90)
GFR calc non Af Amer: 53 mL/min — ABNORMAL LOW (ref >90)
Glucose, Bld: 97 mg/dL (ref 70–99)
POTASSIUM: 4.3 meq/L (ref 3.7–5.3)
Sodium: 141 mEq/L (ref 137–147)
Total Protein: 6.1 g/dL (ref 6.0–8.3)

## 2013-04-09 NOTE — Progress Notes (Signed)
Fentanyl PCA discontinued per order. Remaining 9 cc Fentanyl wasted in sink. Waste verified by Tressia Danas, RN.

## 2013-04-09 NOTE — Progress Notes (Addendum)
[facility] DAILY ICU PROGRESS NOTE                   [place]            [place] [address]          [phone]   2 Days Post-Op Procedure(s) (LRB): VIDEO BRONCHOSCOPY (N/A) VIDEO ASSISTED THORACOSCOPY (VATS)/ LOBECTOMY; INSERTION OF ON-Q PUMP (Left) LYMPH NODE DISSECTION (Left)  Total Length of Stay:  LOS: 2 days   Subjective: Looks and feels well  Objective: Vital signs in last 24 hours: Temp:  [98 F (36.7 C)-99.5 F (37.5 C)] 98.1 F (36.7 C) (03/18 0721) Pulse Rate:  [54-133] 67 (03/18 0800) Cardiac Rhythm:  [-] Normal sinus rhythm (03/18 0800) Resp:  [12-23] 19 (03/18 0800) BP: (98-164)/(43-122) 117/82 mmHg (03/18 0800) SpO2:  [88 %-100 %] 99 % (03/18 0800) Arterial Line BP: (134-151)/(64-71) 141/64 mmHg (03/17 1200) Weight:  [171 lb 11.8 oz (77.9 kg)] 171 lb 11.8 oz (77.9 kg) (03/18 0600)  Filed Weights   04/07/13 1400 04/08/13 0600 04/09/13 0600  Weight: 172 lb 2.9 oz (78.1 kg) 173 lb 8 oz (78.7 kg) 171 lb 11.8 oz (77.9 kg)    Weight change: -7.1 oz (-0.2 kg)   Hemodynamic parameters for last 24 hours:    Intake/Output from previous day: 03/17 0701 - 03/18 0700 In: 3078.7 [P.O.:900; I.V.:2178.7] Out: 2946 [Urine:2615; Stool:1; Chest Tube:330]  Intake/Output this shift: Total I/O In: 54 [I.V.:54] Out: 20 [Chest Tube:20]  Current Meds: Scheduled Meds: . bisacodyl  10 mg Oral Daily  . cholecalciferol  1,000 Units Oral Daily  . enoxaparin (LOVENOX) injection  30 mg Subcutaneous Q24H  . fentaNYL   Intravenous 6 times per day  . ferrous sulfate  325 mg Oral Q breakfast  . magnesium oxide  400 mg Oral Daily  . metoprolol succinate  25 mg Oral Daily  . multivitamin with minerals  1 tablet Oral Daily  . sodium chloride  10-40 mL Intracatheter Q12H  . sodium chloride  3 mL Intravenous Q12H  . travoprost (benzalkonium)  1 drop Both Eyes QHS   Continuous Infusions: . dextrose 5 % and 0.45 % NaCl with KCl 20 mEq/L 50 mL/hr at 04/09/13 0600    PRN Meds:.diphenhydrAMINE, diphenhydrAMINE, labetalol, naloxone, ondansetron (ZOFRAN) IV, ondansetron (ZOFRAN) IV, oxyCODONE-acetaminophen, potassium chloride, senna-docusate, sodium chloride, sodium chloride, sodium chloride, traMADol  General appearance: alert, cooperative and no distress Heart: regular rate and rhythm Lungs: dim in bases Abdomen: benign Extremities: min edema Wound: dressings CDI  Lab Results: CBC: Recent Labs  04/08/13 0450 04/09/13 0420  WBC 9.0 8.4  HGB 10.7* 11.4*  HCT 31.5* 34.1*  PLT 261 255   BMET:  Recent Labs  04/08/13 0450 04/09/13 0420  NA 140 141  K 4.2 4.3  CL 104 106  CO2 25 22  GLUCOSE 143* 97  BUN 8 8  CREATININE 1.06 1.01  CALCIUM 8.4 8.4    ABG    Component Value Date/Time   PHART 7.436 04/08/2013 0453   PCO2ART 38.4 04/08/2013 0453   PO2ART 102.0* 04/08/2013 0453   HCO3 25.9* 04/08/2013 0453   TCO2 27 04/08/2013 0453   ACIDBASEDEF 1.9 04/03/2013 1427   O2SAT 98.0 04/08/2013 0453      PT/INR: No results found for this basename: LABPROT, INR,  in the last 72 hours Radiology: Dg Chest Port 1 View  04/09/2013   CLINICAL DATA:  Postoperative thoracotomy for lung mass  EXAM: PORTABLE CHEST -  1 VIEW  COMPARISON:  April 08, 2013  FINDINGS: Central catheter tip is at the cavoatrial junction. There is a chest tube on the left, unchanged in position. No apparent pneumothorax. There is patchy atelectatic change in both lower lobes. Lungs elsewhere are clear. Heart is mildly enlarged with normal pulmonary vascularity. There is postoperative change in the lower cervical spine. No adenopathy.  IMPRESSION: Patchy lower lobe atelectatic change bilaterally. Tube and catheter positions as described. No pneumothorax.   Electronically Signed   By: Lowella Grip M.D.   On: 04/09/2013 08:19   Dg Chest Port 1 View  04/08/2013   CLINICAL DATA:  Post thoracotomy for lung mass  EXAM: PORTABLE CHEST - 1 VIEW  COMPARISON:  April 07, 2013.  FINDINGS: The  tube and catheter positions are unchanged. No pneumothorax. There is patchy atelectasis in the left base. Lungs are otherwise clear. Heart size and pulmonary vascularity are normal. No adenopathy. There is postoperative change in the lower cervical spine.  IMPRESSION: The tube and catheter positions are unchanged without appreciable pneumothorax. Left base atelectasis. Lungs otherwise clear.   Electronically Signed   By: Lowella Grip M.D.   On: 04/08/2013 08:24   Dg Chest Portable 1 View  04/07/2013   CLINICAL DATA:  Postop VATS, history small cell lung malignancy  EXAM: PORTABLE CHEST - 1 VIEW  COMPARISON:  DG CHEST 2 VIEW dated 04/07/2013  FINDINGS: The lung volumes are low. There is atelectasis at the left lung base. Two chest tubes are present on the left with their tips in the pulmonary apex. No significant pneumothorax or pleural effusion is present. There is a tubular structure at the left lung base which may reflect a small caliber pleural space drain. The cardiac silhouette is top-normal in size. The pulmonary vascularity is not engorged. The right internal jugular venous catheter has its tip at the junction of the SVC with the right atrium.  IMPRESSION: There is bilateral hypoinflation and mild subsegmental atelectasis at the left lung base. No significant pleural effusion or pneumothorax on the left is demonstrated. There is no evidence of CHF   Electronically Signed   By: David  Martinique   On: 04/07/2013 13:53   Chest tubes: minor drainage, no air leak    Assessment/Plan: S/P Procedure(s) (LRB): VIDEO BRONCHOSCOPY (N/A) VIDEO ASSISTED THORACOSCOPY (VATS)/ LOBECTOMY; INSERTION OF ON-Q PUMP (Left) LYMPH NODE DISSECTION (Left)   1 doing very well 2 reduce IVF to [facility] 3 d/c anterior chest tube 4 d/c pca, cont oral meds 5 poss tx to floor 6 labs stable   [name],[name] 04/09/2013 9:01 AM  Path back, nodes negative Stage  IB (pT2a,pN0,cM0) To 3300  I have seen and examined [name] and agree with the above assessment  and plan.  [name] MD Beeper [phone] Office [phone] 04/09/2013 12:52 PM

## 2013-04-09 NOTE — Progress Notes (Signed)
T. surgery p.m. Rounds  Patient resting comfortably Anterior chest tube was removed today Waiting for step down bed, stable day

## 2013-04-10 ENCOUNTER — Inpatient Hospital Stay (HOSPITAL_COMMUNITY): Payer: Medicare Other

## 2013-04-10 ENCOUNTER — Other Ambulatory Visit: Payer: Self-pay | Admitting: Hematology and Oncology

## 2013-04-10 DIAGNOSIS — Z4682 Encounter for fitting and adjustment of non-vascular catheter: Secondary | ICD-10-CM | POA: Diagnosis not present

## 2013-04-10 DIAGNOSIS — J9819 Other pulmonary collapse: Secondary | ICD-10-CM | POA: Diagnosis not present

## 2013-04-10 LAB — PULMONARY FUNCTION TEST
DL/VA % pred: 69 %
DL/VA: 2.93 ml/min/mmHg/L
DLCO cor % pred: 44 %
DLCO cor: 8.4 ml/min/mmHg
DLCO unc % pred: 42 %
DLCO unc: 7.98 ml/min/mmHg
FEF 25-75 Post: 1.19 L/sec
FEF 25-75 Pre: 1.08 L/sec
FEF2575-%Change-Post: 10 %
FEF2575-%Pred-Post: 99 %
FEF2575-%Pred-Pre: 89 %
FEV1-%Change-Post: 2 %
FEV1-%Pred-Post: 105 %
FEV1-%Pred-Pre: 103 %
FEV1-Post: 1.41 L
FEV1-Pre: 1.37 L
FEV1FVC-%Change-Post: -2 %
FEV1FVC-%Pred-Pre: 98 %
FEV6-%Change-Post: 5 %
FEV6-%Pred-Post: 116 %
FEV6-%Pred-Pre: 110 %
FEV6-Post: 1.92 L
FEV6-Pre: 1.82 L
FEV6FVC-%Change-Post: 0 %
FEV6FVC-%Pred-Post: 104 %
FEV6FVC-%Pred-Pre: 104 %
FVC-%Change-Post: 5 %
FVC-%Pred-Post: 110 %
FVC-%Pred-Pre: 105 %
FVC-Post: 1.93 L
FVC-Pre: 1.83 L
Post FEV1/FVC ratio: 73 %
Post FEV6/FVC ratio: 99 %
Pre FEV1/FVC ratio: 75 %
Pre FEV6/FVC Ratio: 99 %
RV % pred: 94 %
RV: 1.99 L
TLC % pred: 87 %
TLC: 3.88 L

## 2013-04-10 LAB — CBC
HCT: 33 % — ABNORMAL LOW (ref 36.0–46.0)
Hemoglobin: 11 g/dL — ABNORMAL LOW (ref 12.0–15.0)
MCH: 31.1 pg (ref 26.0–34.0)
MCHC: 33.3 g/dL (ref 30.0–36.0)
MCV: 93.2 fL (ref 78.0–100.0)
Platelets: 247 10*3/uL (ref 150–400)
RBC: 3.54 MIL/uL — ABNORMAL LOW (ref 3.87–5.11)
RDW: 14.6 % (ref 11.5–15.5)
WBC: 7.9 10*3/uL (ref 4.0–10.5)

## 2013-04-10 LAB — BASIC METABOLIC PANEL
BUN: 9 mg/dL (ref 6–23)
CO2: 24 mEq/L (ref 19–32)
Calcium: 8.5 mg/dL (ref 8.4–10.5)
Chloride: 105 mEq/L (ref 96–112)
Creatinine, Ser: 1.15 mg/dL — ABNORMAL HIGH (ref 0.50–1.10)
GFR calc Af Amer: 53 mL/min — ABNORMAL LOW (ref >90)
GFR calc non Af Amer: 45 mL/min — ABNORMAL LOW (ref >90)
Glucose, Bld: 90 mg/dL (ref 70–99)
Potassium: 4.2 mEq/L (ref 3.7–5.3)
Sodium: 139 mEq/L (ref 137–147)

## 2013-04-10 NOTE — Discharge Summary (Signed)
[address]       [place] [address]             [phone]              Discharge Summary  Name: [name] DOB: 1935-11-06 78 y.o. MRN: [id number]   Admission Date: 04/07/2013 Discharge Date: 04/11/2013    Admitting Diagnosis: Left lower lobe mass   Discharge Diagnosis:  Invasive poorly differentiated adenocarcinoma (T2a, N0) Expected postoperative blood loss anemia  Past Medical History  Diagnosis Date  . Neurologic disorder     2009  . Unspecified vitamin D deficiency   . Arthritis   . Thrombocytopenia, unspecified 02/26/2013  . Cough 02/26/2013  . Lung mass 02/27/2013  . Hypertension   . Asthma   . Shortness of breath     exertion  . Hyperthyroidism     took iodine treatment for this  . Benign hypertensive kidney disease with chronic kidney disease stage I through stage IV, or unspecified     sees dr. Justin Mend again in april  . Chronic kidney disease, stage III (moderate)      Procedures: VIDEO BRONCHOSCOPY - 04/07/2013 LEFT VIDEO ASSISTED THORACOSCOPY/MINI-THORACOTOMY  LEFT LOWER LOBECTOMY  LYMPH NODE DISSECTION      HPI:  The patient is a 78 y.o. female who was originally referred to oncology because of low platelet count (note says 37,000, patients daughter says was 137,000). She has been on Plavix since 2007 for an episode of "forgetting where she was". She denies any bleeding, but was noted to have iron deficiency anemia. Evaluation by oncology for her thrombocytopenia resulted in a chest x-ray with a left lower lobe lung mass which was not present 5 years earlier. A CT scan of the chest revealed a 3.8 cm spiculated left lower lobe lung mass, with increased metabolic activity on PET scan (SUV 15.2).  The patient was referred to Dr. Servando Snare for consideration of surgical resection.   The patient was a smoker for many years and she quit one year ago. She has some shortness of breath with significant exertion. She does live alone  and cares for herself without difficulty, and notes she does her own yard work including cutting the grass with a push mower. She has had no previous cardiac history, denies angina, denies symptoms of congestive heart failure.  It was felt that the lesion represented a carcinoma.  Left VATS/lung resection was recommended. All risks, benefits and alternatives of surgery were explained in detail, and the patient agreed to proceed.        Hospital Course:  The patient was admitted to [facility] on 04/07/2013. The patient was taken to the operating room and underwent the above procedure.    The postoperative course has generally been uneventful. Her chest tubes were removed in the standard fashion, and chest x-rays have remained stable. Pathology revealed invasive adenocarcinoma, stage IB (pT2a,pN0,cM0). She has been ambulating in the halls and tolerating a regular diet. Incisions are healing well. Vital signs have remained stable and she has been weaned from supplemental oxygen. She has been evaluated on today's date and is medically stable for discharge home.    Recent vital signs:  Filed Vitals:   04/10/13 0719  BP:   Pulse:   Temp: 97.9 F (36.6 C)  Resp:     Recent laboratory studies:  CBC: Recent Labs  04/09/13 0420 04/10/13 0402  WBC 8.4 7.9  HGB 11.4* 11.0*  HCT 34.1*  33.0*  PLT 255 247   BMET:  Recent Labs  04/09/13 0420 04/10/13 0402  NA 141 139  K 4.3 4.2  CL 106 105  CO2 22 24  GLUCOSE 97 90  BUN 8 9  CREATININE 1.01 1.15*  CALCIUM 8.4 8.5    PT/INR: No results found for this basename: LABPROT, INR,  in the last 72 hours   Discharge Medications:     Medication List         clopidogrel 75 MG tablet  Commonly known as:  PLAVIX  Take 75 mg by mouth daily with breakfast.     ferrous sulfate 325 (65 FE) MG tablet  Take 325 mg by mouth daily with breakfast.     lisinopril 10 MG tablet  Commonly known as:  PRINIVIL,ZESTRIL  Take 10 mg by mouth daily.       magnesium oxide 400 MG tablet  Commonly known as:  MAG-OX  Take 400 mg by mouth daily.     metoprolol succinate 25 MG 24 hr tablet  Commonly known as:  TOPROL-XL  Take 25 mg by mouth daily.     multivitamin tablet  Take 1 tablet by mouth daily.     oxyCODONE-acetaminophen 5-325 MG per tablet  Commonly known as:  PERCOCET/ROXICET  Take 1-2 tablets by mouth every 4 (four) hours as needed for severe pain.     travoprost (benzalkonium) 0.004 % ophthalmic solution  Commonly known as:  TRAVATAN  Place 1 drop into both eyes at bedtime.     valsartan 80 MG tablet  Commonly known as:  DIOVAN  Take 1 tablet (80 mg total) by mouth daily.     Vitamin D-3 1000 UNITS Caps  Take 1 capsule by mouth daily.         Discharge Instructions:  The patient is to refrain from driving, heavy lifting or strenuous activity.  May shower daily and clean incisions with soap and water.  May resume regular diet.    Follow Up:   Follow-up Information   Follow up with [name], MD On 05/01/2013. (Have a chest x-ray at 3:30, then see MD at 4:30)    Specialty:  Cardiothoracic Surgery   Contact information:   [address] [place] [address] [phone]       Follow up with [facility] NURSE On 04/17/2013. (For suture removal at 10:30)           Juliauna Stueve H 04/10/2013, 8:45 AM

## 2013-04-10 NOTE — Progress Notes (Addendum)
[facility] DAILY ICU PROGRESS NOTE                   [place]            [place] [address]          [phone]   3 Days Post-Op Procedure(s) (LRB): VIDEO BRONCHOSCOPY (N/A) VIDEO ASSISTED THORACOSCOPY (VATS)/ LOBECTOMY; INSERTION OF ON-Q PUMP (Left) LYMPH NODE DISSECTION (Left)  Total Length of Stay:  LOS: 3 days   Subjective: C/o itching at chest tube site region  Objective: Vital signs in last 24 hours: Temp:  [97.9 F (36.6 C)-98.9 F (37.2 C)] 97.9 F (36.6 C) (03/19 0719) Pulse Rate:  [69-87] 71 (03/19 0700) Cardiac Rhythm:  [-] Normal sinus rhythm (03/19 0600) Resp:  [12-27] 21 (03/19 0700) BP: (92-167)/(49-110) 133/68 mmHg (03/19 0700) SpO2:  [90 %-100 %] 93 % (03/19 0700) Weight:  [169 lb 5 oz (76.8 kg)] 169 lb 5 oz (76.8 kg) (03/19 0400)  Filed Weights   04/08/13 0600 04/09/13 0600 04/10/13 0400  Weight: 173 lb 8 oz (78.7 kg) 171 lb 11.8 oz (77.9 kg) 169 lb 5 oz (76.8 kg)    Weight change: -2 lb 6.8 oz (-1.1 kg)   Hemodynamic parameters for last 24 hours:    Intake/Output from previous day: 03/18 0701 - 03/19 0700 In: 737 [P.O.:180; I.V.:557] Out: 1020 [Urine:800; Chest Tube:220]  Intake/Output this shift:    Current Meds: Scheduled Meds: . bisacodyl  10 mg Oral Daily  . cholecalciferol  1,000 Units Oral Daily  . enoxaparin (LOVENOX) injection  30 mg Subcutaneous Q24H  . ferrous sulfate  325 mg Oral Q breakfast  . magnesium oxide  400 mg Oral Daily  . metoprolol succinate  25 mg Oral Daily  . multivitamin with minerals  1 tablet Oral Daily  . sodium chloride  10-40 mL Intracatheter Q12H  . sodium chloride  3 mL Intravenous Q12H  . travoprost (benzalkonium)  1 drop Both Eyes QHS   Continuous Infusions: . dextrose 5 % and 0.45 % NaCl with KCl 20 mEq/L 20 mL/hr at 04/10/13 0600   PRN Meds:.labetalol, ondansetron (ZOFRAN) IV, oxyCODONE-acetaminophen, potassium chloride, senna-docusate, sodium chloride, sodium chloride,  traMADol  General appearance: alert, cooperative and no distress Heart: regular rate and rhythm Lungs: fair air exchange throughout Abdomen: benign Extremities: no edema Wound: some serosang drainage on dressing  Lab Results: CBC:  Recent Labs  04/09/13 0420 04/10/13 0402  WBC 8.4 7.9  HGB 11.4* 11.0*  HCT 34.1* 33.0*  PLT 255 247   BMET:   Recent Labs  04/09/13 0420 04/10/13 0402  NA 141 139  K 4.3 4.2  CL 106 105  CO2 22 24  GLUCOSE 97 90  BUN 8 9  CREATININE 1.01 1.15*  CALCIUM 8.4 8.5    PT/INR: No results found for this basename: LABPROT, INR,  in the last 72 hours Radiology: Dg Chest Port 1 View  04/09/2013   CLINICAL DATA:  Postoperative thoracotomy for lung mass  EXAM: PORTABLE CHEST - 1 VIEW  COMPARISON:  April 08, 2013  FINDINGS: Central catheter tip is at the cavoatrial junction. There is a chest tube on the left, unchanged in position. No apparent pneumothorax. There is patchy atelectatic change in both lower lobes. Lungs elsewhere are clear. Heart is mildly enlarged with normal pulmonary vascularity. There is postoperative change in the lower cervical spine. No adenopathy.  IMPRESSION: Patchy lower lobe atelectatic change bilaterally. Tube and catheter positions as  described. No pneumothorax.   Electronically Signed   By: Lowella Grip M.D.   On: 04/09/2013 08:19   Chest tube : no air leak, 220 cc drainage   Assessment/Plan: S/P Procedure(s) (LRB): VIDEO BRONCHOSCOPY (N/A) VIDEO ASSISTED THORACOSCOPY (VATS)/ LOBECTOMY; INSERTION OF ON-Q PUMP (Left) LYMPH NODE DISSECTION (Left)  1 cont to make good progress 2 d/c chest tube and central line 3 push pulm toilet/rehab 4 labs and CXR stable 5 poss home in 1-2 days     Bishop,Cheryl E 04/10/2013 8:04 AM   To 2w Stable, remove remaining chest tube I have seen and examined [name] and agree with the above assessment  and plan.  [name] MD Beeper [phone] Office  [phone] 04/10/2013 8:32 AM

## 2013-04-10 NOTE — Plan of Care (Signed)
Problem: Phase I Progression Outcomes Goal: Initial discharge plan identified Outcome: Completed/Met Date Met:  04/10/13 Home with daughter  Problem: Phase II Progression Outcomes Goal: Discharge plan established Outcome: Completed/Met Date Met:  04/10/13 Home with daughter

## 2013-04-11 ENCOUNTER — Inpatient Hospital Stay (HOSPITAL_COMMUNITY): Payer: Medicare Other

## 2013-04-11 DIAGNOSIS — J90 Pleural effusion, not elsewhere classified: Secondary | ICD-10-CM | POA: Diagnosis not present

## 2013-04-11 DIAGNOSIS — J9383 Other pneumothorax: Secondary | ICD-10-CM | POA: Diagnosis not present

## 2013-04-11 DIAGNOSIS — J9819 Other pulmonary collapse: Secondary | ICD-10-CM | POA: Diagnosis not present

## 2013-04-11 LAB — BASIC METABOLIC PANEL
BUN: 12 mg/dL (ref 6–23)
CO2: 24 mEq/L (ref 19–32)
Calcium: 8.4 mg/dL (ref 8.4–10.5)
Chloride: 106 mEq/L (ref 96–112)
Creatinine, Ser: 1.14 mg/dL — ABNORMAL HIGH (ref 0.50–1.10)
GFR calc Af Amer: 53 mL/min — ABNORMAL LOW (ref >90)
GFR calc non Af Amer: 46 mL/min — ABNORMAL LOW (ref >90)
Glucose, Bld: 91 mg/dL (ref 70–99)
Potassium: 4.3 mEq/L (ref 3.7–5.3)
Sodium: 142 mEq/L (ref 137–147)

## 2013-04-11 LAB — CBC
HCT: 32.3 % — ABNORMAL LOW (ref 36.0–46.0)
Hemoglobin: 11 g/dL — ABNORMAL LOW (ref 12.0–15.0)
MCH: 31.1 pg (ref 26.0–34.0)
MCHC: 34.1 g/dL (ref 30.0–36.0)
MCV: 91.2 fL (ref 78.0–100.0)
Platelets: 256 10*3/uL (ref 150–400)
RBC: 3.54 MIL/uL — ABNORMAL LOW (ref 3.87–5.11)
RDW: 14.3 % (ref 11.5–15.5)
WBC: 6.2 10*3/uL (ref 4.0–10.5)

## 2013-04-11 MED ORDER — OXYCODONE-ACETAMINOPHEN 5-325 MG PO TABS: 1.0000 | ORAL_TABLET | ORAL | Status: DC | PRN

## 2013-04-11 MED ORDER — PNEUMOCOCCAL VAC POLYVALENT 25 MCG/0.5ML IJ INJ
0.5000 mL | INJECTION | Freq: Once | INTRAMUSCULAR | Status: AC
  Administered 2013-04-11: 0.5 mL via INTRAMUSCULAR
  Filled 2013-04-11: qty 0.5

## 2013-04-11 MED ORDER — PNEUMOCOCCAL VAC POLYVALENT 25 MCG/0.5ML IJ INJ: 0.5000 mL | INJECTION | INTRAMUSCULAR | Status: DC

## 2013-04-11 NOTE — Progress Notes (Addendum)
       [address]       [place] [address]             [phone]          4 Days Post-Op Procedure(s) (LRB): VIDEO BRONCHOSCOPY (N/A) VIDEO ASSISTED THORACOSCOPY (VATS)/ LOBECTOMY; INSERTION OF ON-Q PUMP (Left) LYMPH NODE DISSECTION (Left)  Subjective: "I feel good!"    Objective: Vital signs in last 24 hours: Patient Vitals for the past 24 hrs:  BP Temp Temp src Pulse Resp SpO2 Weight  04/11/13 0557 130/57 mmHg 98.6 F (37 C) Oral 73 18 98 % 169 lb 5 oz (76.8 kg)  04/10/13 2100 157/86 mmHg 98 F (36.7 C) Oral 76 18 97 % -  04/10/13 1303 - 98.3 F (36.8 C) - - - - -  04/10/13 1229 149/73 mmHg - - - 18 99 % -  04/10/13 1159 - 98.1 F (36.7 C) Oral - - - -  04/10/13 1000 128/58 mmHg - - - 18 - -  04/10/13 0900 - - - - 19 - -  04/10/13 0800 115/63 mmHg - - 73 28 97 % -   Current Weight  04/11/13 169 lb 5 oz (76.8 kg)     Intake/Output from previous day:      PHYSICAL EXAM:  Heart: RRR Lungs: Clear Wound: Clean and dry     Lab Results: CBC: Recent Labs  04/10/13 0402 04/11/13 0450  WBC 7.9 6.2  HGB 11.0* 11.0*  HCT 33.0* 32.3*  PLT 247 256   BMET:  Recent Labs  04/10/13 0402 04/11/13 0450  NA 139 142  K 4.2 4.3  CL 105 106  CO2 24 24  GLUCOSE 90 91  BUN 9 12  CREATININE 1.15* 1.14*  CALCIUM 8.5 8.4    PT/INR: No results found for this basename: LABPROT, INR,  in the last 72 hours  CXR: stable    Assessment/Plan: S/P Procedure(s) (LRB): VIDEO BRONCHOSCOPY (N/A) VIDEO ASSISTED THORACOSCOPY (VATS)/ LOBECTOMY; INSERTION OF ON-Q PUMP (Left) LYMPH NODE DISSECTION (Left) Doing well, off O2, CXR stable. Plan d/c home- instructions reviewed with patient.    LOS: 4 days    COLLINS,GINA H 04/11/2013  Stable for d/c today I have seen and examined [name] and agree with the above assessment  and plan.  [name] MD Beeper [phone] Office [phone] 04/11/2013 10:00 AM

## 2013-04-14 ENCOUNTER — Telehealth: Payer: Self-pay | Admitting: Hematology and Oncology

## 2013-04-14 NOTE — Telephone Encounter (Signed)
s/w pt re appt for 4/7

## 2013-04-16 ENCOUNTER — Ambulatory Visit: Payer: Medicare Other | Admitting: Internal Medicine

## 2013-04-17 ENCOUNTER — Ambulatory Visit (INDEPENDENT_AMBULATORY_CARE_PROVIDER_SITE_OTHER): Payer: Self-pay

## 2013-04-17 DIAGNOSIS — D381 Neoplasm of uncertain behavior of trachea, bronchus and lung: Secondary | ICD-10-CM

## 2013-04-17 DIAGNOSIS — Z4802 Encounter for removal of sutures: Secondary | ICD-10-CM

## 2013-04-17 NOTE — Progress Notes (Signed)
Removed 4 sutures from chest tube incision sites with no signs of infection and patient tolerated well  . 

## 2013-04-28 ENCOUNTER — Other Ambulatory Visit: Payer: Self-pay | Admitting: *Deleted

## 2013-04-28 DIAGNOSIS — C3490 Malignant neoplasm of unspecified part of unspecified bronchus or lung: Secondary | ICD-10-CM

## 2013-04-29 ENCOUNTER — Encounter: Payer: Self-pay | Admitting: Hematology and Oncology

## 2013-04-29 ENCOUNTER — Telehealth: Payer: Self-pay | Admitting: Hematology and Oncology

## 2013-04-29 ENCOUNTER — Ambulatory Visit (HOSPITAL_BASED_OUTPATIENT_CLINIC_OR_DEPARTMENT_OTHER): Payer: Medicare Other | Admitting: Hematology and Oncology

## 2013-04-29 VITALS — BP 150/69 | HR 58 | Temp 97.5°F | Resp 20 | Ht 60.0 in | Wt 164.0 lb

## 2013-04-29 DIAGNOSIS — C3490 Malignant neoplasm of unspecified part of unspecified bronchus or lung: Secondary | ICD-10-CM

## 2013-04-29 DIAGNOSIS — C3430 Malignant neoplasm of lower lobe, unspecified bronchus or lung: Secondary | ICD-10-CM

## 2013-04-29 NOTE — Progress Notes (Signed)
[place] OFFICE PROGRESS NOTE  Patient Care Team: Glendale Chard, MD as PCP - General (Internal Medicine)  DIAGNOSIS: Resected lung cancer  SUMMARY OF ONCOLOGIC HISTORY: Oncology History   Non-small cell lung cancer, adenocarcinoma   Primary site: Lung (Left)   Staging method: AJCC 7th Edition   Clinical: Stage IB (T2a, N0, M0) signed by Heath Lark, MD on 04/29/2013  9:01 PM   Pathologic: Stage IB (T2a, N0, cM0) signed by [name], MD on 04/09/2013 12:51 PM   Summary: Stage IB (T2a, N0, cM0)       Non-small cell lung cancer   02/26/2013 Imaging CXR for evaluation of cough showed new left lung mass   02/28/2013 Imaging CT chest showed left new lung nodule   03/14/2013 Imaging PET/CT scan showed localized disease in the left lung   04/07/2013 Surgery She underwent bronchoscopy and left lower lobe resection and lymph node sampling with negative margins    INTERVAL HISTORY: [name] 78 y.o. female returns for further follow-up. She was referred to pulmonologist after she was found to have lung mass, discovered when she presented with cough. She is recovering well from her recent surgery.  I have reviewed the past medical history, past surgical history, social history and family history with the patient and they are unchanged from previous note.  ALLERGIES:  is allergic to aspirin; lactose intolerance (gi); and lisinopril.  MEDICATIONS:  Current Outpatient Prescriptions  Medication Sig Dispense Refill  . Cholecalciferol (VITAMIN D-3) 1000 UNITS CAPS Take 1 capsule by mouth daily.      . clopidogrel (PLAVIX) 75 MG tablet Take 75 mg by mouth daily with breakfast.       . ferrous sulfate 325 (65 FE) MG tablet Take 325 mg by mouth daily with breakfast.      . magnesium oxide (MAG-OX) 400 MG tablet Take 400 mg by mouth daily.      . metoprolol succinate (TOPROL-XL) 25 MG 24 hr tablet Take 25 mg by mouth daily.       . Multiple Vitamin (MULTIVITAMIN) tablet Take 1  tablet by mouth daily.      Marland Kitchen oxyCODONE-acetaminophen (PERCOCET/ROXICET) 5-325 MG per tablet Take 1-2 tablets by mouth every 4 (four) hours as needed for severe pain.  30 tablet  0  . travoprost, benzalkonium, (TRAVATAN) 0.004 % ophthalmic solution Place 1 drop into both eyes at bedtime.      . valsartan (DIOVAN) 80 MG tablet Take 1 tablet (80 mg total) by mouth daily.       No current facility-administered medications for this visit.    REVIEW OF SYSTEMS:   Constitutional: Denies fevers, chills or abnormal weight loss Eyes: Denies blurriness of vision Ears, nose, mouth, throat, and face: Denies mucositis or sore throat Respiratory: Denies cough, dyspnea or wheezes Cardiovascular: Denies palpitation, chest discomfort or lower extremity swelling Gastrointestinal:  Denies nausea, heartburn or change in bowel habits Skin: Denies abnormal skin rashes Lymphatics: Denies new lymphadenopathy or easy bruising Neurological:Denies numbness, tingling or new weaknesses Behavioral/Psych: Mood is stable, no new changes  All other systems were reviewed with the patient and are negative.  PHYSICAL EXAMINATION: ECOG PERFORMANCE STATUS: 0 - Asymptomatic  Filed Vitals:   04/29/13 1218  BP: 150/69  Pulse: 58  Temp: 97.5 F (36.4 C)  Resp: 20   Filed Weights   04/29/13 1218  Weight: 164 lb (74.39 kg)    GENERAL:alert, no distress and comfortable SKIN: skin color, texture, turgor are normal, no  rashes or significant lesions EYES: normal, Conjunctiva are pink and non-injected, sclera clear OROPHARYNX:no exudate, no erythema and lips, buccal mucosa, and tongue normal  NECK: supple, thyroid normal size, non-tender, without nodularity LYMPH:  no palpable lymphadenopathy in the cervical, axillary or inguinal LUNGS: clear to auscultation and percussion with normal breathing effort. Well healed surgical scar. HEART: regular rate & rhythm and no murmurs and no lower extremity edema ABDOMEN:abdomen  soft, non-tender and normal bowel sounds Musculoskeletal:no cyanosis of digits and no clubbing  NEURO: alert & oriented x 3 with fluent speech, no focal motor/sensory deficits  LABORATORY DATA:  I have reviewed the data as listed    Component Value Date/Time   NA 142 04/11/2013 0450   NA 142 02/26/2013 1459   K 4.3 04/11/2013 0450   K 4.2 02/26/2013 1459   CL 106 04/11/2013 0450   CO2 24 04/11/2013 0450   CO2 24 02/26/2013 1459   GLUCOSE 91 04/11/2013 0450   GLUCOSE 77 02/26/2013 1459   BUN 12 04/11/2013 0450   BUN 11.8 02/26/2013 1459   CREATININE 1.14* 04/11/2013 0450   CREATININE 1.0 02/26/2013 1459   CALCIUM 8.4 04/11/2013 0450   CALCIUM 9.7 02/26/2013 1459   PROT 6.1 04/09/2013 0420   PROT 6.9 02/26/2013 1459   ALBUMIN 2.9* 04/09/2013 0420   ALBUMIN 3.9 02/26/2013 1459   AST 26 04/09/2013 0420   AST 24 02/26/2013 1459   ALT 16 04/09/2013 0420   ALT 23 02/26/2013 1459   ALKPHOS 71 04/09/2013 0420   ALKPHOS 73 02/26/2013 1459   BILITOT 0.3 04/09/2013 0420   BILITOT 0.23 02/26/2013 1459   GFRNONAA 46* 04/11/2013 0450   GFRAA 53* 04/11/2013 0450    No results found for this basename: SPEP, UPEP,  kappa and lambda light chains    Lab Results  Component Value Date   WBC 6.2 04/11/2013   NEUTROABS 4.1 02/26/2013   HGB 11.0* 04/11/2013   HCT 32.3* 04/11/2013   MCV 91.2 04/11/2013   PLT 256 04/11/2013      Chemistry      Component Value Date/Time   NA 142 04/11/2013 0450   NA 142 02/26/2013 1459   K 4.3 04/11/2013 0450   K 4.2 02/26/2013 1459   CL 106 04/11/2013 0450   CO2 24 04/11/2013 0450   CO2 24 02/26/2013 1459   BUN 12 04/11/2013 0450   BUN 11.8 02/26/2013 1459   CREATININE 1.14* 04/11/2013 0450   CREATININE 1.0 02/26/2013 1459      Component Value Date/Time   CALCIUM 8.4 04/11/2013 0450   CALCIUM 9.7 02/26/2013 1459   ALKPHOS 71 04/09/2013 0420   ALKPHOS 73 02/26/2013 1459   AST 26 04/09/2013 0420   AST 24 02/26/2013 1459   ALT 16 04/09/2013 0420   ALT 23 02/26/2013 1459   BILITOT 0.3 04/09/2013 0420   BILITOT  0.23 02/26/2013 1459     RADIOGRAPHIC STUDIES: I reviewed her last CT scan and PET Ct scan I have personally reviewed the radiological images as listed and agreed with the findings in the report.  ASSESSMENT & PLAN:  #1 Resected lung cancer I discussed with her role of adjuvant chemotherapy. Based on recent clinical trial, there is a small benefit (7 to 8%) for resected stage IB patients, especially with poorly differentiated histology, pleural invasion, and size > 4cm.  The patient recovered well from recent surgery and has excellent performance status at her age. I would like her to attend patient chemo education  class to get all available information so she can make an informed decision. I also encouraged her to participate in clinical trial. She would like to think about it. I encouraged her to call me end of the week to let me know her decision. If she elected not to pursue adjuvant therapy, I plan to see her back in my clinic with history, exam, lab and CT scan of chest for new baseline.  Orders Placed This Encounter  Procedures  . CT Chest W Contrast    Standing Status: Future     Number of Occurrences:      Standing Expiration Date: 06/29/2014    Order Specific Question:  Reason for Exam (SYMPTOM  OR DIAGNOSIS REQUIRED)    Answer:  lung ca, r/o recurrence    Order Specific Question:  Preferred imaging location?    Answer:  [facility]  . CBC with Differential    Standing Status: Future     Number of Occurrences:      Standing Expiration Date: 04/29/2014  . Comprehensive metabolic panel    Standing Status: Future     Number of Occurrences:      Standing Expiration Date: 04/29/2014   All questions were answered. The patient knows to call the clinic with any problems, questions or concerns. No barriers to learning was detected. I spent 40 minutes counseling the patient face to face. The total time spent in the appointment was 55 minutes and more than 50% was on counseling and  review of test results     [facility], [place], MD 04/29/2013 9:12 PM

## 2013-04-29 NOTE — Telephone Encounter (Signed)
gave pt appt for lab and MD for july and chemo class on april 2015

## 2013-04-30 ENCOUNTER — Telehealth: Payer: Self-pay | Admitting: Hematology and Oncology

## 2013-04-30 NOTE — Telephone Encounter (Signed)
MD is waiting for pt to decide when to do chemo, per [facility]

## 2013-05-01 ENCOUNTER — Encounter: Payer: Self-pay | Admitting: Cardiothoracic Surgery

## 2013-05-01 ENCOUNTER — Ambulatory Visit
Admission: RE | Admit: 2013-05-01 | Discharge: 2013-05-01 | Disposition: A | Discharge status: Home / Self Care | Payer: Medicare Other | Source: Ambulatory Visit | Attending: Cardiothoracic Surgery | Admitting: Cardiothoracic Surgery

## 2013-05-01 ENCOUNTER — Ambulatory Visit (INDEPENDENT_AMBULATORY_CARE_PROVIDER_SITE_OTHER): Payer: Self-pay | Admitting: Cardiothoracic Surgery

## 2013-05-01 VITALS — BP 140/60 | HR 60 | Resp 20 | Ht 60.0 in | Wt 164.0 lb

## 2013-05-01 DIAGNOSIS — C3490 Malignant neoplasm of unspecified part of unspecified bronchus or lung: Secondary | ICD-10-CM

## 2013-05-01 DIAGNOSIS — Z902 Acquired absence of lung [part of]: Secondary | ICD-10-CM

## 2013-05-01 DIAGNOSIS — G8918 Other acute postprocedural pain: Secondary | ICD-10-CM

## 2013-05-01 DIAGNOSIS — Z9889 Other specified postprocedural states: Secondary | ICD-10-CM

## 2013-05-01 DIAGNOSIS — J9819 Other pulmonary collapse: Secondary | ICD-10-CM | POA: Diagnosis not present

## 2013-05-01 DIAGNOSIS — J90 Pleural effusion, not elsewhere classified: Secondary | ICD-10-CM | POA: Diagnosis not present

## 2013-05-01 DIAGNOSIS — Z85118 Personal history of other malignant neoplasm of bronchus and lung: Secondary | ICD-10-CM | POA: Diagnosis not present

## 2013-05-01 MED ORDER — OXYCODONE-ACETAMINOPHEN 5-325 MG PO TABS: 1.0000 | ORAL_TABLET | Freq: Three times a day (TID) | ORAL | Status: DC | PRN

## 2013-05-01 NOTE — Progress Notes (Signed)
[address]       [place] [address]             [phone]      [name] [facility] Medical Record #[id number] Date of Birth: January 21, 1936  Referring: Tanda Rockers, MD Primary Care: Maximino Greenland, MD  Chief Complaint:   POST OP FOLLOW UP 04/07/2013  OPERATIVE REPORT  Preop diagnosis: Left lower lobe lung mass suspicious for primary carcinoma the lung  Postop diagnosis: Same, non-small cell lung cancer probably adenocarcinoma by frozen section  Procedure:  Bronchoscopy  Left video-assisted thoracoscopy  Left lower lobectomy and lymph node dissection  Placement of On-Q device  Surgeon: [name] MD   Stage IB ( pT2a,pN0,cM0)  Diagnosis 1. Lung, resection (segmental or lobe), Left lower lobe - INVASIVE ADENOCARCINOMA, POORLY DIFFERENTIATED, SPANNING 4.1 CM. - ADENOCARCINOMA INVOLVES PLEURA. - THE SURGICAL RESECTION MARGINS ARE NEGATIVE FOR ADENOCARCINOMA. - ONE BENIGN HILAR LYMPH NODE (0/1). - SEE ONCOLOGY TABLE BELOW. 2. Lymph node, biopsy, 8 L - THERE IS NO EVIDENCE OF CARCINOMA IN 1 OF 1 LYMPH NODE (0/1). 3. Lymph node, biopsy, 11 L - THERE IS NO EVIDENCE OF CARCINOMA IN 1 OF 1 LYMPH NODE (0/1). 4. Lymph node, biopsy, 12 L - THERE IS NO EVIDENCE OF CARCINOMA IN 1 OF 1 LYMPH NODE (0/1). 5. Lymph node, biopsy, 10 L - THERE IS NO EVIDENCE OF CARCINOMA IN 1 OF 1 LYMPH NODE (0/1). 6. Lymph node, biopsy, 4 L - THERE IS NO EVIDENCE OF CARCINOMA IN 1 OF 1 LYMPH NODE (0/1).  History of Present Illness:      Patient is making excellent progress following lobectomy especially considering her age of 65 years. She has some chest wall discomfort, but has returned to near normal activities. She denies shortness of breath. She has been seen in the [facility] by  Dr. Alvy Bimler who has discussed with her the pros and cons of chemotherapy in her pathologic setting. The patient is contemplating her options, but at this point is pretty sure she  will report back to oncology that she wishes to proceed with chemotherapy.    Past Medical History  Diagnosis Date  . Neurologic disorder     2009  . Unspecified vitamin D deficiency   . Arthritis   . Thrombocytopenia, unspecified 02/26/2013  . Cough 02/26/2013  . Lung mass 02/27/2013  . Hypertension   . Asthma   . Shortness of breath     exertion  . Hyperthyroidism     took iodine treatment for this  . Benign hypertensive kidney disease with chronic kidney disease stage I through stage IV, or unspecified     sees dr. Justin Mend again in april  . Chronic kidney disease, stage III (moderate)      History  Smoking status  . Former Smoker -- 0.50 packs/day for 60 years  . Types: Cigarettes  . Quit date: 02/25/2012  Smokeless tobacco  . Never Used    History  Alcohol Use No     Allergies  Allergen Reactions  . Aspirin     REACTION: stomach upset  . Lactose Intolerance (Gi) Diarrhea and Other (See Comments)    "can't drink milk. It tears my stomach up."  . Lisinopril     cough    Current Outpatient Prescriptions  Medication Sig Dispense Refill  . Cholecalciferol (VITAMIN D-3) 1000 UNITS CAPS Take 1 capsule by mouth daily.      . clopidogrel (  PLAVIX) 75 MG tablet Take 75 mg by mouth daily with breakfast.       . ferrous sulfate 325 (65 FE) MG tablet Take 325 mg by mouth daily with breakfast.      . magnesium oxide (MAG-OX) 400 MG tablet Take 400 mg by mouth daily.      . metoprolol succinate (TOPROL-XL) 25 MG 24 hr tablet Take 25 mg by mouth daily.       . Multiple Vitamin (MULTIVITAMIN) tablet Take 1 tablet by mouth daily.      Marland Kitchen oxyCODONE-acetaminophen (PERCOCET/ROXICET) 5-325 MG per tablet Take 1-2 tablets by mouth every 8 (eight) hours as needed for severe pain.  30 tablet  0  . travoprost, benzalkonium, (TRAVATAN) 0.004 % ophthalmic solution Place 1 drop into both eyes at bedtime.      . valsartan (DIOVAN) 80 MG tablet Take 1 tablet (80 mg total) by mouth daily.       No  current facility-administered medications for this visit.       Physical Exam: BP 140/60  Pulse 60  Resp 20  Ht 5' (1.524 m)  Wt 164 lb (74.39 kg)  BMI 32.03 kg/m2  SpO2 96%  General appearance: alert and cooperative Neurologic: intact Heart: regular rate and rhythm, S1, S2 normal, no murmur, click, rub or gallop Lungs: clear to auscultation bilaterally Abdomen: soft, non-tender; bowel sounds normal; no masses,  no organomegaly Extremities: extremities normal, atraumatic, no cyanosis or edema and Homans sign is negative, no sign of DVT Wound: Her left chest incisions are well-healed   Diagnostic Studies & Laboratory data:     Recent Radiology Findings:   Dg Chest 2 View  05/01/2013   CLINICAL DATA:  History of left lower lobectomy for non-small cell lung carcinoma 3 weeks ago, followup  EXAM: CHEST  2 VIEW  COMPARISON:  Chest x-ray of 04/11/2013  FINDINGS: Opacity at the left lung base remains consistent with left pleural effusion and left basilar atelectasis. No pneumothorax is seen. The right lung is clear. Mediastinal contours are stable. Heart size is stable. A lower anterior cervical spine fusion plate is noted.  IMPRESSION: Slightly better aeration of the left lung base. Persistent small left effusion with left basilar atelectasis.   Electronically Signed   By: Ivar Drape M.D.   On: 05/01/2013 15:57      Recent Lab Findings: Lab Results  Component Value Date   WBC 6.2 04/11/2013   HGB 11.0* 04/11/2013   HCT 32.3* 04/11/2013   PLT 256 04/11/2013   GLUCOSE 91 04/11/2013   ALT 16 04/09/2013   AST 26 04/09/2013   NA 142 04/11/2013   K 4.3 04/11/2013   CL 106 04/11/2013   CREATININE 1.14* 04/11/2013   BUN 12 04/11/2013   CO2 24 04/11/2013   INR 0.94 04/03/2013      Assessment / Plan:     Patient doing well following left lower lobectomy for a 4.2 cm adenocarcinoma stage IB She is considering the recommendation to proceed with chemotherapy, if she wishes to proceed we will  we'll make arrangements to place a Port-A-Cath. She is currently on Plavix and will need to stop this for several days. [facility] testing has been sent results are still pending.      [name] MD      [address] [place], [address] Office [phone]   [place]  05/01/2013 5:14 PM

## 2013-05-02 ENCOUNTER — Telehealth: Payer: Self-pay | Admitting: *Deleted

## 2013-05-02 ENCOUNTER — Other Ambulatory Visit: Payer: Self-pay | Admitting: Hematology and Oncology

## 2013-05-02 ENCOUNTER — Other Ambulatory Visit: Payer: Self-pay | Admitting: *Deleted

## 2013-05-02 ENCOUNTER — Telehealth: Payer: Self-pay | Admitting: Hematology and Oncology

## 2013-05-02 DIAGNOSIS — C3490 Malignant neoplasm of unspecified part of unspecified bronchus or lung: Secondary | ICD-10-CM

## 2013-05-02 NOTE — Telephone Encounter (Signed)
s/w pt dtr re appt for 4/15. per dtr pt can't come 4/15 for ched or f/u. pt has other appts 4/14, 4/15 and 4/17. dtr asking for another day. message to NG (full).

## 2013-05-02 NOTE — Telephone Encounter (Signed)
Called Dr. Everrett Coombe office and s/w nurse, Henrine Screws.  Informed of request for [facility] placement by Dr. Servando Snare. She will call us back.

## 2013-05-02 NOTE — Telephone Encounter (Signed)
Jolene from Dr. Servando Snare called back and states they will schedule pt for [facility] on Tuesday 4/14.  Called pt's daughter and informed her of this and she states pt can't do it on Tues due to conflict w/ Kidney doctor appt.   Asked Dau to wait for call from Surgeon's office and to work with them on a date for surgery.  Please call us back when she knows the date and then we will get pt scheduled back to see Dr. Alvy Bimler after that.  Instructed her we can r/s chemo education class if it conflicts w/ new surgery date.  She verbalized understanding.

## 2013-05-02 NOTE — Telephone Encounter (Signed)
Daughter states pt wants to proceed with chemotherapy.   They would like Dr. Servando Snare to place the [facility].

## 2013-05-02 NOTE — Telephone Encounter (Signed)
Can you ask her daughter to call Dr. Roxy Horseman office to get it scheduled? I will send him an inbasket Please ask her daughter to let us know when it will be scheduled and I can bring them back for consent and order chemo Thanks

## 2013-05-05 ENCOUNTER — Encounter (HOSPITAL_COMMUNITY): Payer: Self-pay

## 2013-05-05 ENCOUNTER — Encounter (HOSPITAL_COMMUNITY): Payer: Self-pay | Admitting: *Deleted

## 2013-05-05 MED ORDER — DEXTROSE 5 % IV SOLN
1.5000 g | INTRAVENOUS | Status: AC
  Administered 2013-05-06: 1.5 g via INTRAVENOUS
  Filled 2013-05-05: qty 1.5

## 2013-05-06 ENCOUNTER — Ambulatory Visit (HOSPITAL_COMMUNITY): Payer: Medicare Other

## 2013-05-06 ENCOUNTER — Ambulatory Visit (HOSPITAL_COMMUNITY)
Admission: RE | Admit: 2013-05-06 | Discharge: 2013-05-06 | Disposition: A | Discharge status: Home / Self Care | Payer: Medicare Other | Source: Ambulatory Visit | Attending: Cardiothoracic Surgery | Admitting: Cardiothoracic Surgery

## 2013-05-06 ENCOUNTER — Encounter (HOSPITAL_COMMUNITY): Payer: Medicare Other | Admitting: Certified Registered"

## 2013-05-06 ENCOUNTER — Ambulatory Visit (HOSPITAL_COMMUNITY): Payer: Medicare Other | Admitting: Certified Registered"

## 2013-05-06 ENCOUNTER — Encounter (HOSPITAL_COMMUNITY)
Admission: RE | Disposition: A | Discharge status: Home / Self Care | Payer: Self-pay | Source: Ambulatory Visit | Attending: Cardiothoracic Surgery

## 2013-05-06 ENCOUNTER — Encounter (HOSPITAL_COMMUNITY): Payer: Self-pay | Admitting: *Deleted

## 2013-05-06 DIAGNOSIS — I509 Heart failure, unspecified: Secondary | ICD-10-CM | POA: Diagnosis not present

## 2013-05-06 DIAGNOSIS — Z452 Encounter for adjustment and management of vascular access device: Secondary | ICD-10-CM | POA: Diagnosis not present

## 2013-05-06 DIAGNOSIS — C3490 Malignant neoplasm of unspecified part of unspecified bronchus or lung: Secondary | ICD-10-CM | POA: Diagnosis not present

## 2013-05-06 DIAGNOSIS — N183 Chronic kidney disease, stage 3 unspecified: Secondary | ICD-10-CM | POA: Insufficient documentation

## 2013-05-06 DIAGNOSIS — I252 Old myocardial infarction: Secondary | ICD-10-CM | POA: Insufficient documentation

## 2013-05-06 DIAGNOSIS — J9819 Other pulmonary collapse: Secondary | ICD-10-CM | POA: Diagnosis not present

## 2013-05-06 DIAGNOSIS — Z01810 Encounter for preprocedural cardiovascular examination: Secondary | ICD-10-CM | POA: Diagnosis not present

## 2013-05-06 DIAGNOSIS — E559 Vitamin D deficiency, unspecified: Secondary | ICD-10-CM | POA: Insufficient documentation

## 2013-05-06 DIAGNOSIS — Z902 Acquired absence of lung [part of]: Secondary | ICD-10-CM | POA: Diagnosis not present

## 2013-05-06 DIAGNOSIS — C3430 Malignant neoplasm of lower lobe, unspecified bronchus or lung: Secondary | ICD-10-CM | POA: Insufficient documentation

## 2013-05-06 DIAGNOSIS — E0590 Thyrotoxicosis, unspecified without thyrotoxic crisis or storm: Secondary | ICD-10-CM | POA: Diagnosis not present

## 2013-05-06 DIAGNOSIS — J90 Pleural effusion, not elsewhere classified: Secondary | ICD-10-CM | POA: Diagnosis not present

## 2013-05-06 DIAGNOSIS — D696 Thrombocytopenia, unspecified: Secondary | ICD-10-CM | POA: Diagnosis not present

## 2013-05-06 DIAGNOSIS — I129 Hypertensive chronic kidney disease with stage 1 through stage 4 chronic kidney disease, or unspecified chronic kidney disease: Secondary | ICD-10-CM | POA: Insufficient documentation

## 2013-05-06 HISTORY — PX: PORTACATH PLACEMENT: SHX2246

## 2013-05-06 LAB — CBC
HCT: 33.6 % — ABNORMAL LOW (ref 36.0–46.0)
Hemoglobin: 11.4 g/dL — ABNORMAL LOW (ref 12.0–15.0)
MCH: 30.4 pg (ref 26.0–34.0)
MCHC: 33.9 g/dL (ref 30.0–36.0)
MCV: 89.6 fL (ref 78.0–100.0)
Platelets: 261 10*3/uL (ref 150–400)
RBC: 3.75 MIL/uL — ABNORMAL LOW (ref 3.87–5.11)
RDW: 14.8 % (ref 11.5–15.5)
WBC: 5.9 10*3/uL (ref 4.0–10.5)

## 2013-05-06 LAB — COMPREHENSIVE METABOLIC PANEL
ALT: 17 U/L (ref 0–35)
AST: 24 U/L (ref 0–37)
Albumin: 3.5 g/dL (ref 3.5–5.2)
Alkaline Phosphatase: 70 U/L (ref 39–117)
BUN: 14 mg/dL (ref 6–23)
CO2: 20 mEq/L (ref 19–32)
Calcium: 9.5 mg/dL (ref 8.4–10.5)
Chloride: 106 mEq/L (ref 96–112)
Creatinine, Ser: 1.02 mg/dL (ref 0.50–1.10)
GFR calc Af Amer: 61 mL/min — ABNORMAL LOW (ref >90)
GFR calc non Af Amer: 52 mL/min — ABNORMAL LOW (ref >90)
Glucose, Bld: 97 mg/dL (ref 70–99)
Potassium: 4 mEq/L (ref 3.7–5.3)
Sodium: 141 mEq/L (ref 137–147)
Total Bilirubin: <0.2 mg/dL — ABNORMAL LOW (ref 0.3–1.2)
Total Protein: 6.5 g/dL (ref 6.0–8.3)

## 2013-05-06 LAB — PROTIME-INR
INR: 0.96 (ref 0.00–1.49)
Prothrombin Time: 12.6 seconds (ref 11.6–15.2)

## 2013-05-06 LAB — APTT: aPTT: 26 seconds (ref 24–37)

## 2013-05-06 SURGERY — INSERTION, TUNNELED CENTRAL VENOUS DEVICE, WITH PORT
Anesthesia: Monitor Anesthesia Care | Site: Chest | Laterality: Left

## 2013-05-06 MED ORDER — MIDAZOLAM HCL 5 MG/5ML IJ SOLN
INTRAMUSCULAR | Status: DC | PRN
  Administered 2013-05-06: 2 mg via INTRAVENOUS

## 2013-05-06 MED ORDER — FENTANYL CITRATE 0.05 MG/ML IJ SOLN
INTRAMUSCULAR | Status: AC
  Filled 2013-05-06: qty 5

## 2013-05-06 MED ORDER — FENTANYL CITRATE 0.05 MG/ML IJ SOLN
INTRAMUSCULAR | Status: DC | PRN
  Administered 2013-05-06 (×4): 25 ug via INTRAVENOUS

## 2013-05-06 MED ORDER — MIDAZOLAM HCL 2 MG/2ML IJ SOLN
INTRAMUSCULAR | Status: AC
  Filled 2013-05-06: qty 2

## 2013-05-06 MED ORDER — PHENYLEPHRINE 40 MCG/ML (10ML) SYRINGE FOR IV PUSH (FOR BLOOD PRESSURE SUPPORT)
PREFILLED_SYRINGE | INTRAVENOUS | Status: AC
Start: 2013-05-06 — End: 2013-05-06
  Filled 2013-05-06: qty 10

## 2013-05-06 MED ORDER — LACTATED RINGERS IV SOLN
INTRAVENOUS | Status: DC | PRN
  Administered 2013-05-06: 07:00:00 via INTRAVENOUS

## 2013-05-06 MED ORDER — SODIUM CHLORIDE 0.9 % IR SOLN
Status: DC | PRN
  Administered 2013-05-06: 1000 mL

## 2013-05-06 MED ORDER — OXYCODONE HCL 5 MG PO TABS: 5.0000 mg | ORAL_TABLET | Freq: Once | ORAL | Status: DC | PRN

## 2013-05-06 MED ORDER — LIDOCAINE HCL (PF) 1 % IJ SOLN
INTRAMUSCULAR | Status: AC
Start: 2013-05-06 — End: 2013-05-06
  Filled 2013-05-06: qty 30

## 2013-05-06 MED ORDER — SODIUM CHLORIDE 0.9 % IJ SOLN
INTRAMUSCULAR | Status: AC
Start: 2013-05-06 — End: 2013-05-06
  Filled 2013-05-06: qty 10

## 2013-05-06 MED ORDER — HEPARIN SODIUM (PORCINE) 1000 UNIT/ML IJ SOLN
INTRAMUSCULAR | Status: AC
  Filled 2013-05-06: qty 1

## 2013-05-06 MED ORDER — PROPOFOL 10 MG/ML IV BOLUS
INTRAVENOUS | Status: AC
  Filled 2013-05-06: qty 20

## 2013-05-06 MED ORDER — HEPARIN SOD (PORK) LOCK FLUSH 100 UNIT/ML IV SOLN
INTRAVENOUS | Status: DC | PRN
  Administered 2013-05-06: 500 [IU] via INTRAVENOUS

## 2013-05-06 MED ORDER — OXYCODONE HCL 5 MG/5ML PO SOLN: 5.0000 mg | Freq: Once | ORAL | Status: DC | PRN

## 2013-05-06 MED ORDER — PROPOFOL INFUSION 10 MG/ML OPTIME
INTRAVENOUS | Status: DC | PRN
  Administered 2013-05-06: 25 ug/kg/min via INTRAVENOUS

## 2013-05-06 MED ORDER — LIDOCAINE HCL (PF) 1 % IJ SOLN
INTRAMUSCULAR | Status: DC | PRN
  Administered 2013-05-06: 13 mL via INTRADERMAL
  Administered 2013-05-06: 30 mL via INTRADERMAL

## 2013-05-06 MED ORDER — FENTANYL CITRATE 0.05 MG/ML IJ SOLN: 25.0000 ug | INTRAMUSCULAR | Status: DC | PRN

## 2013-05-06 MED ORDER — SODIUM CHLORIDE 0.9 % IR SOLN
Status: DC | PRN
  Administered 2013-05-06: 07:00:00

## 2013-05-06 MED ORDER — LIDOCAINE HCL (CARDIAC) 20 MG/ML IV SOLN
INTRAVENOUS | Status: AC
  Filled 2013-05-06: qty 5

## 2013-05-06 MED ORDER — ONDANSETRON HCL 4 MG/2ML IJ SOLN: 4.0000 mg | Freq: Once | INTRAMUSCULAR | Status: DC | PRN

## 2013-05-06 MED ORDER — ROCURONIUM BROMIDE 50 MG/5ML IV SOLN
INTRAVENOUS | Status: AC
  Filled 2013-05-06: qty 1

## 2013-05-06 MED ORDER — LIDOCAINE HCL (CARDIAC) 20 MG/ML IV SOLN
INTRAVENOUS | Status: DC | PRN
  Administered 2013-05-06: 30 mg via INTRAVENOUS

## 2013-05-06 MED ORDER — EPHEDRINE SULFATE 50 MG/ML IJ SOLN
INTRAMUSCULAR | Status: AC
  Filled 2013-05-06: qty 1

## 2013-05-06 MED ORDER — SUCCINYLCHOLINE CHLORIDE 20 MG/ML IJ SOLN
INTRAMUSCULAR | Status: AC
  Filled 2013-05-06: qty 1

## 2013-05-06 MED ORDER — HEPARIN SOD (PORK) LOCK FLUSH 100 UNIT/ML IV SOLN
INTRAVENOUS | Status: AC
  Filled 2013-05-06: qty 5

## 2013-05-06 SURGICAL SUPPLY — 44 items
ADH SKN CLS APL DERMABOND .7 (GAUZE/BANDAGES/DRESSINGS) ×1
ADH SKN CLS LQ APL DERMABOND (GAUZE/BANDAGES/DRESSINGS) ×1
BAG DECANTER FOR FLEXI CONT (MISCELLANEOUS) ×2 IMPLANT
BLADE SURG 11 STRL SS (BLADE) ×2 IMPLANT
CANISTER SUCTION 2500CC (MISCELLANEOUS) ×2 IMPLANT
COVER SURGICAL LIGHT HANDLE (MISCELLANEOUS) ×2 IMPLANT
DERMABOND ADHESIVE PROPEN (GAUZE/BANDAGES/DRESSINGS) ×1
DERMABOND ADVANCED (GAUZE/BANDAGES/DRESSINGS) ×1
DERMABOND ADVANCED .7 DNX12 (GAUZE/BANDAGES/DRESSINGS) ×1 IMPLANT
DERMABOND ADVANCED .7 DNX6 (GAUZE/BANDAGES/DRESSINGS) IMPLANT
DRAPE C-ARM 42X72 X-RAY (DRAPES) ×2 IMPLANT
DRAPE CHEST BREAST 15X10 FENES (DRAPES) ×2 IMPLANT
DRSG AQUACEL AG ADV 3.5X14 (GAUZE/BANDAGES/DRESSINGS) ×2 IMPLANT
ELECT CAUTERY BLADE 6.4 (BLADE) ×2 IMPLANT
ELECT REM PT RETURN 9FT ADLT (ELECTROSURGICAL) ×2
ELECTRODE REM PT RTRN 9FT ADLT (ELECTROSURGICAL) ×1 IMPLANT
GLOVE BIO SURGEON STRL SZ 6.5 (GLOVE) ×4 IMPLANT
GOWN STRL REUS W/ TWL LRG LVL3 (GOWN DISPOSABLE) ×2 IMPLANT
GOWN STRL REUS W/TWL LRG LVL3 (GOWN DISPOSABLE) ×4
GUIDEWIRE UNCOATED ST S 7038 (WIRE) IMPLANT
INTRODUCER 13FR (MISCELLANEOUS) IMPLANT
INTRODUCER COOK 11FR (CATHETERS) IMPLANT
KIT BASIN OR (CUSTOM PROCEDURE TRAY) ×1 IMPLANT
KIT PORT POWER 9.6FR MRI PREA (Catheter) ×1 IMPLANT
KIT PORT POWER ISP 8FR (Catheter) IMPLANT
KIT POWER CATH 8FR (Catheter) IMPLANT
KIT ROOM TURNOVER OR (KITS) ×2 IMPLANT
NDL HYPO 25GX1X1/2 BEV (NEEDLE) ×1 IMPLANT
NEEDLE 22X1 1/2 (OR ONLY) (NEEDLE) IMPLANT
NEEDLE HYPO 25GX1X1/2 BEV (NEEDLE) ×2 IMPLANT
NS IRRIG 1000ML POUR BTL (IV SOLUTION) ×2 IMPLANT
PACK GENERAL/GYN (CUSTOM PROCEDURE TRAY) ×2 IMPLANT
PAD ARMBOARD 7.5X6 YLW CONV (MISCELLANEOUS) ×4 IMPLANT
SET SHEATH INTRODUCER 10FR (MISCELLANEOUS) IMPLANT
SPONGE GAUZE 4X4 12PLY (GAUZE/BANDAGES/DRESSINGS) ×2 IMPLANT
SUT SILK 2 0 SH (SUTURE) ×3 IMPLANT
SUT VIC AB 3-0 SH 18 (SUTURE) ×2 IMPLANT
SUT VICRYL 4-0 PS2 18IN ABS (SUTURE) ×2 IMPLANT
SYR 20CC LL (SYRINGE) ×2 IMPLANT
SYR 5ML LUER SLIP (SYRINGE) IMPLANT
SYR CONTROL 10ML LL (SYRINGE) ×2 IMPLANT
TOWEL OR 17X24 6PK STRL BLUE (TOWEL DISPOSABLE) ×2 IMPLANT
TOWEL OR 17X26 10 PK STRL BLUE (TOWEL DISPOSABLE) ×2 IMPLANT
WATER STERILE IRR 1000ML POUR (IV SOLUTION) ×2 IMPLANT

## 2013-05-06 NOTE — Transfer of Care (Signed)
Immediate Anesthesia Transfer of Care Note  Patient: [name]  Procedure(s) Performed: Procedure(s): INSERTION PORT-A-CATH (Left)  Patient Location: PACU  Anesthesia Type:MAC  Level of Consciousness: awake, alert  and oriented  Airway & Oxygen Therapy: Patient Spontanous Breathing  Post-op Assessment: Report given to PACU RN  Post vital signs: stable  Complications: No apparent anesthesia complications

## 2013-05-06 NOTE — Discharge Instructions (Signed)
Implanted Port Home Guide °An implanted port is a type of central line that is placed under the skin. Central lines are used to provide IV access when treatment or nutrition needs to be given through a person's veins. Implanted ports are used for long-term IV access. An implanted port may be placed because:  °· You need IV medicine that would be irritating to the small veins in your hands or arms.   °· You need long-term IV medicines, such as antibiotics.   °· You need IV nutrition for a long period.   °· You need frequent blood draws for lab tests.   °· You need dialysis.   °Implanted ports are usually placed in the chest area, but they can also be placed in the upper arm, the abdomen, or the leg. An implanted port has two main parts:  °· Reservoir. The reservoir is round and will appear as a small, raised area under your skin. The reservoir is the part where a needle is inserted to give medicines or draw blood.   °· Catheter. The catheter is a thin, flexible tube that extends from the reservoir. The catheter is placed into a large vein. Medicine that is inserted into the reservoir goes into the catheter and then into the vein.   °HOW WILL I CARE FOR MY INCISION SITE? °Do not get the incision site wet. Bathe or shower as directed by your health care provider.  °HOW IS MY PORT ACCESSED? °Special steps must be taken to access the port:  °· Before the port is accessed, a numbing cream can be placed on the skin. This helps numb the skin over the port site.   °· Your health care provider uses a sterile technique to access the port. °· Your health care provider must put on a mask and sterile gloves. °· The skin over your port is cleaned carefully with an antiseptic and allowed to dry. °· The port is gently pinched between sterile gloves, and a needle is inserted into the port. °· Only "non-coring" port needles should be used to access the port. Once the port is accessed, a blood return should be checked. This helps  ensure that the port is in the vein and is not clogged.   °· If your port needs to remain accessed for a constant infusion, a clear (transparent) bandage will be placed over the needle site. The bandage and needle will need to be changed every week, or as directed by your health care provider.   °· Keep the bandage covering the needle clean and dry. Do not get it wet. Follow your health care provider's instructions on how to take a shower or bath while the port is accessed.   °· If your port does not need to stay accessed, no bandage is needed over the port.   °WHAT IS FLUSHING? °Flushing helps keep the port from getting clogged. Follow your health care provider's instructions on how and when to flush the port. Ports are usually flushed with saline solution or a medicine called heparin. The need for flushing will depend on how the port is used.  °· If the port is used for intermittent medicines or blood draws, the port will need to be flushed:   °· After medicines have been given.   °· After blood has been drawn.   °· As part of routine maintenance.   °· If a constant infusion is running, the port may not need to be flushed.   °HOW LONG WILL MY PORT STAY IMPLANTED? °The port can stay in for as long as your health care   provider thinks it is needed. When it is time for the port to come out, surgery will be done to remove it. The procedure is similar to the one performed when the port was put in.  WHEN SHOULD I SEEK IMMEDIATE MEDICAL CARE? When you have an implanted port, you should seek immediate medical care if:   You notice a bad smell coming from the incision site.   You have swelling, redness, or drainage at the incision site.   You have more swelling or pain at the port site or the surrounding area.   You have a fever that is not controlled with medicine. Document Released: 01/09/2005 Document Revised: 10/30/2012 Document Reviewed: 09/16/2012 [facility] Patient Information 2014 [place].   What to eat:  For your first meals, you should eat lightly; only small meals initially.  If you do not have nausea, you may eat larger meals.  Avoid spicy, greasy and heavy food.    General Anesthesia, Adult, Care After  Refer to this sheet in the next few weeks. These instructions provide you with information on caring for yourself after your procedure. Your health care provider may also give you more specific instructions. Your treatment has been planned according to current medical practices, but problems sometimes occur. Call your health care provider if you have any problems or questions after your procedure.  WHAT TO EXPECT AFTER THE PROCEDURE  After the procedure, it is typical to experience:  Sleepiness.  Nausea and vomiting. HOME CARE INSTRUCTIONS  For the first 24 hours after general anesthesia:  Have a responsible person with you.  Do not drive a car. If you are alone, do not take public transportation.  Do not drink alcohol.  Do not take medicine that has not been prescribed by your health care provider.  Do not sign important papers or make important decisions.  You may resume a normal diet and activities as directed by your health care provider.  Change bandages (dressings) as directed.  If you have questions or problems that seem related to general anesthesia, call the hospital and ask for the anesthetist or anesthesiologist on call. SEEK MEDICAL CARE IF:  You have nausea and vomiting that continue the day after anesthesia.  You develop a rash. SEEK IMMEDIATE MEDICAL CARE IF:  You have difficulty breathing.  You have chest pain.  You have any allergic problems. Document Released: 04/17/2000 Document Revised: 09/11/2012 Document Reviewed: 07/25/2012  [facility] Patient Information 2014 [place], [place].   Laceration Care, Adult    A laceration is a cut that goes through all layers of the skin. The cut goes into the tissue beneath the skin.  HOME CARE  For  stitches (sutures) or staples:  Keep the cut clean and dry.  If you have a bandage (dressing), change it at least once a day. Change the bandage if it gets wet or dirty, or as told by your doctor.  Wash the cut with soap and water 2 times a day. Rinse the cut with water. Pat it dry with a clean towel.  Put a thin layer of medicated cream on the cut as told by your doctor.  You may shower after the first 24 hours. Do not soak the cut in water until the stitches are removed.  Only take medicines as told by your doctor.  Have your stitches or staples removed as told by your doctor. For skin adhesive strips:  Keep the cut clean and dry.  Do not get the strips wet.  You may take a bath, but be careful to keep the cut dry.  If the cut gets wet, pat it dry with a clean towel.  The strips will fall off on their own. Do not remove the strips that are still stuck to the cut. For wound glue:  You may shower or take baths. Do not soak or scrub the cut. Do not swim. Avoid heavy sweating until the glue falls off on its own. After a shower or bath, pat the cut dry with a clean towel.  Do not put medicine on your cut until the glue falls off.  If you have a bandage, do not put tape over the glue.  Avoid lots of sunlight or tanning lamps until the glue falls off. Put sunscreen on the cut for the first year to reduce your scar.  The glue will fall off on its own. Do not pick at the glue. You may need a tetanus shot if:  You cannot remember when you had your last tetanus shot.  You have never had a tetanus shot. If you need a tetanus shot and you choose not to have one, you may get tetanus. Sickness from tetanus can be serious.  GET HELP RIGHT AWAY IF:  Your pain does not get better with medicine.  Your arm, hand, leg, or foot loses feeling (numbness) or changes color.  Your cut is bleeding.  Your joint feels weak, or you cannot use your joint.  You have painful lumps on your body.  Your cut is red, puffy  (swollen), or painful.  You have a red line on the skin near the cut.  You have yellowish-white fluid (pus) coming from the cut.  You have a fever.  You have a bad smell coming from the cut or bandage.  Your cut breaks open before or after stitches are removed.  You notice something coming out of the cut, such as wood or glass.  You cannot move a finger or toe. MAKE SURE YOU:  Understand these instructions.  Will watch your condition.  Will get help right away if you are not doing well or get worse. Document Released: 06/28/2007 Document Revised: 04/03/2011 Document Reviewed: 07/05/2010  [facility] Patient Information 2014 [place].

## 2013-05-06 NOTE — Progress Notes (Signed)
Pt will go to phase 2 when returning from xray.

## 2013-05-06 NOTE — Brief Op Note (Signed)
      [address]       [place] [address]             [phone]      05/06/2013  8:27 AM  PATIENT:  [name]  78 y.o. female  PRE-OPERATIVE DIAGNOSIS:  LUNG CANCER  POST-OPERATIVE DIAGNOSIS:  Lung Cancer  PROCEDURE:  Procedure(s): INSERTION PORT-A-CATH (Left) With Fluro and Ultrasound guidence SURGEON:  Surgeon(s) and Role:    * [name], MD - Primary     ANESTHESIA:   local and MAC  EBL:  Total I/O In: 500 [I.V.:500] Out: -   BLOOD ADMINISTERED:none  DRAINS: none   LOCAL MEDICATIONS USED:  LIDOCAINE  and Amount: 13 ml  SPECIMEN:  No Specimen  DISPOSITION OF SPECIMEN:  N/A  COUNTS:  YES   DICTATION: .Dragon Dictation  PLAN OF CARE: Discharge to home after PACU  PATIENT DISPOSITION:  PACU - hemodynamically stable.   Delay start of Pharmacological VTE agent (>24hrs) due to surgical blood loss or risk of bleeding: yes

## 2013-05-06 NOTE — Anesthesia Preprocedure Evaluation (Addendum)
Anesthesia Evaluation  Patient identified by MRN, date of birth, ID band Patient awake    Reviewed: Allergy & Precautions, H&P , NPO status , Patient's Chart, lab work & pertinent test results, reviewed documented beta blocker date and time   History of Anesthesia Complications Negative for: history of anesthetic complications  Airway Mallampati: II TM Distance: >3 FB   Mouth opening: Limited Mouth Opening  Dental  (+) Teeth Intact, Dental Advisory Given, Poor Dentition,    Pulmonary shortness of breath and with exertion, former smoker,  breath sounds clear to auscultation        Cardiovascular hypertension, Pt. on medications and Pt. on home beta blockers - angina- Past MI, - CHF and - DOE - dysrhythmias - Valvular Problems/MurmursRhythm:Regular     Neuro/Psych Low back surgery, no neuro deficit, neck surgery no movement limitiations    GI/Hepatic negative GI ROS, Neg liver ROS,   Endo/Other  Hyperthyroidism   Renal/GU CRFRenal disease     Musculoskeletal   Abdominal (+)  Abdomen: soft. Bowel sounds: normal.  Peds  Hematology   Anesthesia Other Findings   Reproductive/Obstetrics                       Anesthesia Physical Anesthesia Plan  ASA: III  Anesthesia Plan: MAC   Post-op Pain Management:    Induction: Intravenous  Airway Management Planned: Natural Airway and Simple Face Mask  Additional Equipment:   Intra-op Plan:   Post-operative Plan:   Informed Consent: I have reviewed the patients History and Physical, chart, labs and discussed the procedure including the risks, benefits and alternatives for the proposed anesthesia with the patient or authorized representative who has indicated his/her understanding and acceptance.   Dental advisory given  Plan Discussed with: CRNA and Surgeon  Anesthesia Plan Comments:         Anesthesia Quick Evaluation

## 2013-05-06 NOTE — Anesthesia Postprocedure Evaluation (Addendum)
  Anesthesia Post-op Note  Patient: [name]  Procedure(s) Performed: Procedure(s): INSERTION PORT-A-CATH (Left)  Patient Location: PACU  Anesthesia Type:MAC  Level of Consciousness: awake and alert   Airway and Oxygen Therapy: Patient Spontanous Breathing  Post-op Pain: mild  Post-op Assessment: Post-op Vital signs reviewed, Patient's Cardiovascular Status Stable, Respiratory Function Stable, Patent Airway, No signs of Nausea or vomiting and Pain level controlled  Post-op Vital Signs: Reviewed and stable  Last Vitals:  Filed Vitals:   05/06/13 0915  BP: 160/62  Pulse: 50  Temp: 36.4 C  Resp: 15    Complications: No apparent anesthesia complications

## 2013-05-06 NOTE — Anesthesia Procedure Notes (Signed)
Procedure Name: MAC Date/Time: 05/06/2013 7:35 AM Performed by: Maeola Harman Pre-anesthesia Checklist: Patient identified, Emergency Drugs available, Suction available, Patient being monitored and Timeout performed Patient Re-evaluated:Patient Re-evaluated prior to inductionOxygen Delivery Method: Simple face mask Preoxygenation: Pre-oxygenation with 100% oxygen Intubation Type: IV induction Placement Confirmation: positive ETCO2 and breath sounds checked- equal and bilateral Dental Injury: Teeth and Oropharynx as per pre-operative assessment

## 2013-05-06 NOTE — Progress Notes (Signed)
Pt going downstairs to xray.

## 2013-05-06 NOTE — H&P (Signed)
[address]       [place] [address]             [phone]                        [name] Wilkinson Medical Record #[id number] Date of Birth: [birth year]  Referring: Tanda Rockers, MD Primary Care: Maximino Greenland, MD  Chief Complaint:    Chief Complaint  Patient presents with  . Lung Mass    surgical eval on left lower lobe pulmonary mass, Chest CT 02/28/13, PET Scan 03/14/13    History of Present Illness:    [name] 78 y.o. female is referred for evaluation of left lower lobe lung mass. The patient had no specific symptoms referable to lung mass. She was referred to oncology because of low platelet count. (note says 37,000, patients daughter says was 137,000. She has been on Plavix since 2007 for an episode of "forgetting where she was". She denies any bleeding, but was noted to have iron deficiency anemia. Evaluation by oncology for her thrombocytopenia resulted in a chest x-ray with a left lower lobe lung mass which was not present on film 5 years earlier. A CT scan of the chest and PET scan have been performed and the patient is referred for consideration of resection of a left lower lobe hypermetabolic lung lesion highly suspicious for malignancy. The patient has been a smoker for many years she quit one year ago. She has some shortness of breath with significant exertion. She does live alone and cares for herself without difficulty, and notes she does her own yard work including cutting the grass with a push mower.   She's had no previous cardiac history, denies angina, denies symptoms of congestive heart failure. Screening PFTs have been performed include spirometry only.   She notes she was also referred for renal evaluation, Dr. Justin Mend.  Patient had lung resection three weeks ago, now here for placement of portacath  Current Activity/ Functional Status:  Patient is independent with mobility/ambulation, transfers, ADL's,  IADL's.   Zubrod Score: At the time of surgery this patient's most appropriate activity status/level should be described as: []     0    Normal activity, no symptoms [x]     1    Restricted in physical strenuous activity but ambulatory, able to do out light work []     2    Ambulatory and capable of self care, unable to do work activities, up and about               >50 % of waking hours                              []     3    Only limited self care, in bed greater than 50% of waking hours []     4    Completely disabled, no self care, confined to bed or chair []     5    Moribund   Past Medical History  Diagnosis Date  . Neurologic disorder     2009  . Unspecified vitamin D deficiency   . Arthritis   . Thrombocytopenia, unspecified 02/26/2013  . Cough 02/26/2013  . Lung mass 02/27/2013  . Hypertension   . Asthma   . Shortness of breath     exertion  . Hyperthyroidism  took iodine treatment for this  . Benign hypertensive kidney disease with chronic kidney disease stage I through stage IV, or unspecified     sees dr. Justin Mend again in april  . Chronic kidney disease, stage III (moderate)     Past Surgical History  Procedure Laterality Date  . Back surgery    . Neck surgery    . Eye surgery      cataract  . Eye surgery Left     open up tear duct  . Video bronchoscopy N/A 04/07/2013    Procedure: VIDEO BRONCHOSCOPY;  Surgeon: [name], MD;  Location: [facility] OR;  Service: Thoracic;  Laterality: N/A;  . Video assisted thoracoscopy (vats)/ lobectomy Left 04/07/2013    Procedure: VIDEO ASSISTED THORACOSCOPY (VATS)/ LOBECTOMY; INSERTION OF ON-Q PUMP;  Surgeon: [name], MD;  Location: [facility];  Service: Thoracic;  Laterality: Left;  . Lymph node dissection Left 04/07/2013    Procedure: LYMPH NODE DISSECTION;  Surgeon: [name], MD;  Location: [facility] OR;  Service: Thoracic;  Laterality: Left;    Family History  Problem Relation Age of Onset  . Lung cancer Brother      smoked  . Breast cancer Mother   . Lung cancer Father     smoked  . Rectal cancer Neg Hx   . Stomach cancer Neg Hx   . Esophageal cancer Neg Hx   . Colon cancer Neg Hx    Patient's son died of myocardial infarction at a young age  History   Social History  . Marital Status: Single    Spouse Name: N/A    Number of Children: 2  . Years of Education: N/A   Occupational History  . Retired    Social History Main Topics  . Smoking status: Former Smoker -- 0.50 packs/day for 60 years    Types: Cigarettes    Quit date: 02/25/2012  . Smokeless tobacco: Never Used  . Alcohol Use: No  . Drug Use: No  . Sexual Activity: Not on file   Other Topics Concern  . Not on file   Social History Narrative  . No narrative on file    History  Smoking status  . Former Smoker -- 0.50 packs/day for 60 years  . Types: Cigarettes  . Quit date: 02/25/2012  Smokeless tobacco  . Never Used    History  Alcohol Use No     Allergies  Allergen Reactions  . Aspirin     REACTION: stomach upset  . Lactose Intolerance (Gi) Diarrhea and Other (See Comments)    "can't drink milk. It tears my stomach up."  . Lisinopril     cough  . Adhesive [Tape] Rash    Current Facility-Administered Medications  Medication Dose Route Frequency Provider Last Rate Last Dose  . cefUROXime (ZINACEF) 1.5 g in dextrose 5 % 50 mL IVPB  1.5 g Intravenous 60 min Pre-Op [name], MD      . lidocaine (PF) (XYLOCAINE) 1 % injection    PRN [name], MD   30 mL at 05/06/13 8828  . sodium chloride irrigation 0.9 %    PRN [name], MD   1,000 mL at 05/06/13 0034   Facility-Administered Medications Ordered in Other Encounters  Medication Dose Route Frequency Provider Last Rate Last Dose  . lactated ringers infusion    Continuous PRN Maeola Harman, CRNA         Review of Systems:  Cardiac Review of Systems: Y or N  Chest Pain [ n   ]  Resting SOB [   ] Exertional SOB  [  ]  Orthopnea  [  ]   Pedal Edema [ n  ]    Palpitations [ n ] Syncope  [n  ]   Presyncope [  n ]  General Review of Systems: [Y] = yes [  ]=no Constitional: recent weight change [  ];  Wt loss over the last 3 months [   ] anorexia [  ]; fatigue [  ]; nausea [  ]; night sweats [  ]; fever [  ]; or chills [  ];          Dental: poor dentition[  ]; Last Dentist visit:   Eye : blurred vision [  ]; diplopia [   ]; vision changes [  ];  Amaurosis fugax[  ]; Resp: cough [ some with ace ];  wheezing[ n ];  hemoptysis[  ]; shortness of breath[  ]; paroxysmal nocturnal dyspnea[ y ]; dyspnea on exertion[  ]; or orthopnea[  ];  GI:  gallstones[  ], vomiting[  ];  dysphagia[  ]; melena[  ];  hematochezia [  ]; heartburn[  ];   Hx of  Colonoscopy[ y ]; GU: kidney stones [  ]; hematuria[  ];   dysuria [  ];  nocturia[  ];  history of     obstruction [  ]; urinary frequency [  ]             Skin: rash, swelling[  ];, hair loss[  ];  peripheral edema[  ];  or itching[  ]; Musculosketetal: myalgias[y  ];  joint swelling[  ];  joint erythema[  ];  joint pain[  ];  back pain[  ];  Heme/Lymph: bruising[  ];  bleeding[  ];  anemia[  ];  Neuro: TIA[ n ];  headaches[  n];  stroke[ ? ];  vertigo[  ];  seizures[ n ];   paresthesias[ n ];  difficulty walking[ n ];  Psych:depression[  ]; anxiety[  ];  Endocrine: diabetes[n  ];  thyroid dysfunction[  ];  Immunizations: Flu up to date [ y ]; Pneumococcal up to date [ y ];  Other:  Physical Exam: BP 166/55  Pulse 56  Temp(Src) 97.4 F (36.3 C) (Oral)  Resp 20  Ht 5' (1.524 m)  Wt 164 lb (74.39 kg)  BMI 32.03 kg/m2  SpO2 98%  PHYSICAL EXAMINATION:  General appearance: alert, cooperative, appears stated age and no distress Neurologic: intact Heart: regular rate and rhythm, S1, S2 normal, no murmur, click, rub or gallop Lungs: clear to auscultation bilaterally Abdomen: soft, non-tender; bowel sounds normal; no masses,  no organomegaly Extremities: extremities normal,  atraumatic, no cyanosis or edema and Homans sign is negative, no sign of DVT Patient has no carotid bruits, palpable DP and PT pulses bilaterally Patient has not had any cervical or supraclavicular or axillary adenopathy appreciated Left chest incision well healed  Diagnostic Studies & Laboratory data:     Recent Radiology Findings:  Dg Chest 2 View  05/06/2013   CLINICAL DATA:  Preop for Port-A-Cath placement. History of lung cancer.  EXAM: CHEST  2 VIEW  COMPARISON:  DG CHEST 2 VIEW dated 05/01/2013  FINDINGS: There is blunting of the left costophrenic angle. There is no focal parenchymal opacity, pleural effusion, or pneumothorax. The heart and mediastinal contours are unremarkable.  The osseous structures  are unremarkable.  IMPRESSION: No active cardiopulmonary disease.   Electronically Signed   By: Kathreen Devoid   On: 05/06/2013 06:37    Dg Chest 2 View  03/28/2013   CLINICAL DATA:  Cough  EXAM: CHEST  2 VIEW  COMPARISON:  NM PET IMAGE INITIAL (PI) SKULL BASE TO THIGH dated 03/14/2013; CT CHEST W/CM dated 02/28/2013; DG CHEST 2 VIEW dated 02/26/2013  FINDINGS: There is a rounded airspace opacity in the left lower lobe. There is no pleural effusion or pneumothorax. The heart and mediastinal contours are unremarkable.  There is evidence of prior anterior cervical disc fusion.  IMPRESSION: Persistent rounded airspace opacity in the left lower lobe which has not significantly changed compared with 02/27/2012. Given the persistence, this is concerning for malignancy until proven otherwise.   Electronically Signed   By: Kathreen Devoid   On: 03/28/2013 14:05   Nm Pet Image Initial (pi) Skull Base To Thigh  03/14/2013   CLINICAL DATA:  Initial treatment strategy for lung mass.  EXAM: NUCLEAR MEDICINE PET SKULL BASE TO THIGH  FASTING BLOOD GLUCOSE:  Value: 89 mg/dl  TECHNIQUE: 9.7 mCi F-18 FDG was injected intravenously. Full-ring PET imaging was performed from the skull base to thigh after the radiotracer. CT data  was obtained and used for attenuation correction and anatomic localization.  COMPARISON:  Chest CT 02/28/2013  FINDINGS: NECK  No hypermetabolic lymph nodes in the neck. Left thyroid goiter again noted.  CHEST  The 3.8 cm left lower lobe lung mass demonstrates marked FDG uptake with SUV max of 15.2. No metabolically active mediastinal or hilar lymph nodes. No other pulmonary nodules are identified.  ABDOMEN/PELVIS  No abnormal hypermetabolic activity within the liver, pancreas, adrenal glands, or spleen. No hypermetabolic lymph nodes in the abdomen or pelvis.  SKELETON  No focal hypermetabolic activity to suggest skeletal metastasis.  IMPRESSION: 3.8 cm hypermetabolic left lower lobe pulmonary mass consistent with neoplasm. No mediastinal or hilar adenopathy and no evidence for metastatic disease involving the neck, chest, abdomen or pelvis. No osseous metastasis.   Electronically Signed   By: Kalman Jewels M.D.   On: 03/14/2013 15:00      Recent Lab Findings: Lab Results  Component Value Date   WBC 5.9 05/06/2013   HGB 11.4* 05/06/2013   HCT 33.6* 05/06/2013   PLT 261 05/06/2013   GLUCOSE 97 05/06/2013   ALT 17 05/06/2013   AST 24 05/06/2013   NA 141 05/06/2013   K 4.0 05/06/2013   CL 106 05/06/2013   CREATININE 1.02 05/06/2013   BUN 14 05/06/2013   CO2 20 05/06/2013   INR 0.96 05/06/2013      Assessment / Plan:   Plan portacath  placement for chemo The goals risks and alternatives of the planned surgical procedure portacath  have been discussed with the patient in detail. The risks of the procedure including death, infection, stroke, myocardial infarction, bleeding, blood transfusion, pneumothorax and need for chest tube have all been discussed specifically.  I have quoted Barrie Dunker a 0.5 % of perioperative mortality and a complication rate as high as 10 %. The patient's questions have been answered.Ara Grandmaison is willing  to proceed with the planned procedure.  [name] MD       [address] [place] [address] Office [phone]   Beeper [phone]  05/06/2013 7:23 AM

## 2013-05-07 ENCOUNTER — Ambulatory Visit: Payer: Medicare Other | Admitting: Hematology and Oncology

## 2013-05-07 ENCOUNTER — Other Ambulatory Visit: Payer: Medicare Other

## 2013-05-07 ENCOUNTER — Telehealth: Payer: Self-pay | Admitting: Hematology and Oncology

## 2013-05-07 ENCOUNTER — Encounter (HOSPITAL_COMMUNITY): Payer: Self-pay | Admitting: Cardiothoracic Surgery

## 2013-05-07 DIAGNOSIS — D631 Anemia in chronic kidney disease: Secondary | ICD-10-CM | POA: Diagnosis not present

## 2013-05-07 DIAGNOSIS — N2581 Secondary hyperparathyroidism of renal origin: Secondary | ICD-10-CM | POA: Diagnosis not present

## 2013-05-07 DIAGNOSIS — N183 Chronic kidney disease, stage 3 unspecified: Secondary | ICD-10-CM | POA: Diagnosis not present

## 2013-05-07 DIAGNOSIS — N039 Chronic nephritic syndrome with unspecified morphologic changes: Secondary | ICD-10-CM | POA: Diagnosis not present

## 2013-05-07 NOTE — Telephone Encounter (Signed)
s/w dtr re new d/t for ched and NG 4/21 @ 9:30am. per NG f/t 70min 4/21 @ 11:15am. ched @ 9:30am.

## 2013-05-10 NOTE — Op Note (Signed)
[name], [name] NO.:  [id number]  MEDICAL RECORD NO.:  [id number]  LOCATION:  [facility]                         FACILITY:  [facility]  PHYSICIAN:  Lanelle Bal, MD    DATE OF BIRTH:  [birth year]  DATE OF PROCEDURE:  05/06/2013 DATE OF DISCHARGE:  05/06/2013                              OPERATIVE REPORT   PREOPERATIVE DIAGNOSIS:  Resected lung cancer.  POSTOPERATIVE DIAGNOSIS:  Resected lung cancer.  SURGICAL PROCEDURE:  Insertion of left subclavian vein Port-A-Cath with fluoro and ultrasound guidance.  SURGEON:  Lanelle Bal, MD  BRIEF HISTORY:  The patient is a 78 year old female, who recently underwent lobectomy for carcinoma of the lung.  Because of the size of the mass, in stage of 1B T2a lesion, chemotherapy was recommended and request had been made by Oncology to place a Port-A-Cath to assist the patient in her chemotherapy treatments.  The patient agreed and signed informed consent.  DESCRIPTION OF PROCEDURE:  The patient was brought to the operating room and placed in supine position.  The chest was prepped with Betadine and draped in usual sterile manner.  Under MAC anesthesia, appropriate time- out was performed.  __________ infiltrated left infraclavicular area. After appropriate anesthesia under ultrasound guidance, a 16-gauge needle was introduced into the left subclavian vein without difficulty. With fluoroscopy, the guidewire was positioned into the superior vena cava.  A counterincision was made over the left anterior chest and a 9.6- French pre-attached PowerPort port was placed in the subcutaneous pocket and secured.  The catheter was then tunneled to the subclavian vein insertion site.  The peel-away sheath was placed over the guidewire.  A Port-A-Cath was trimmed to appropriate length, and through the sheath, the catheter was placed in the subclavian vein.  There was easy blood returned with this and the catheter was flushed with  heparinized saline first and then a concentrated 2.5 mL of heparin solution.  The fluoroscopy showed good position of the Port-A-Cath.  The incisions were closed with interrupted 3-0 Vicryl, and a Dermabond was placed on the wounds, and the patient was transferred to the recovery room having tolerated the procedure without obvious complication.  Postop chest x- ray will be obtained.  The patient tolerated the procedure well.     Lanelle Bal, MD     EG/MEDQ  D:  05/09/2013  T:  05/10/2013  Job:  [id number]

## 2013-05-13 ENCOUNTER — Encounter: Payer: Self-pay | Admitting: *Deleted

## 2013-05-13 ENCOUNTER — Ambulatory Visit (HOSPITAL_BASED_OUTPATIENT_CLINIC_OR_DEPARTMENT_OTHER): Payer: Medicare Other | Admitting: Hematology and Oncology

## 2013-05-13 ENCOUNTER — Other Ambulatory Visit: Payer: Medicare Other

## 2013-05-13 VITALS — BP 168/57 | HR 60 | Temp 97.1°F | Resp 20 | Ht 60.0 in | Wt 165.8 lb

## 2013-05-13 DIAGNOSIS — D649 Anemia, unspecified: Secondary | ICD-10-CM | POA: Diagnosis not present

## 2013-05-13 DIAGNOSIS — H9190 Unspecified hearing loss, unspecified ear: Secondary | ICD-10-CM | POA: Diagnosis not present

## 2013-05-13 DIAGNOSIS — C3430 Malignant neoplasm of lower lobe, unspecified bronchus or lung: Secondary | ICD-10-CM

## 2013-05-13 DIAGNOSIS — C3490 Malignant neoplasm of unspecified part of unspecified bronchus or lung: Secondary | ICD-10-CM

## 2013-05-13 MED ORDER — PROCHLORPERAZINE MALEATE 10 MG PO TABS: 10.0000 mg | ORAL_TABLET | Freq: Four times a day (QID) | ORAL | Status: DC | PRN

## 2013-05-13 MED ORDER — ONDANSETRON HCL 8 MG PO TABS: 8.0000 mg | ORAL_TABLET | Freq: Two times a day (BID) | ORAL | Status: DC | PRN

## 2013-05-13 MED ORDER — LIDOCAINE-PRILOCAINE 2.5-2.5 % EX CREA: 1.0000 "application " | TOPICAL_CREAM | CUTANEOUS | Status: DC | PRN

## 2013-05-13 NOTE — Progress Notes (Signed)
[place] [facility] OFFICE PROGRESS NOTE  Patient Care Team: Glendale Chard, MD as PCP - General (Internal Medicine) Heath Lark, MD as Consulting Physician (Hematology and Oncology) [name], MD as Consulting Physician (Cardiothoracic Surgery)  DIAGNOSIS: Stage IB resected lung cancer  SUMMARY OF ONCOLOGIC HISTORY: Oncology History   Non-small cell lung cancer, adenocarcinoma   Primary site: Lung (Left)   Staging method: AJCC 7th Edition   Clinical: Stage IB (T2a, N0, M0) signed by Heath Lark, MD on 04/29/2013  9:01 PM   Pathologic: Stage IB (T2a, N0, cM0) signed by [name], MD on 04/09/2013 12:51 PM   Summary: Stage IB (T2a, N0, cM0)       Non-small cell lung cancer   02/26/2013 Imaging CXR for evaluation of cough showed new left lung mass   02/28/2013 Imaging CT chest showed left new lung nodule   03/14/2013 Imaging PET/CT scan showed localized disease in the left lung   04/07/2013 Surgery She underwent bronchoscopy and left lower lobe resection and lymph node sampling with negative margins   05/06/2013 Procedure The patient has placement of Infuse-a-Port    INTERVAL HISTORY: [name] 78 y.o. female returns for further followup. The purpose of today's appointment is to discuss about the role of adjuvant chemotherapy. She has no new complaints.  I have reviewed the past medical history, past surgical history, social history and family history with the patient and they are unchanged from previous note.  ALLERGIES:  is allergic to aspirin; lactose intolerance (gi); lisinopril; and adhesive.  MEDICATIONS:  Current Outpatient Prescriptions  Medication Sig Dispense Refill  . Cholecalciferol (VITAMIN D-3) 1000 UNITS CAPS Take 1 capsule by mouth daily.      . clopidogrel (PLAVIX) 75 MG tablet Take 75 mg by mouth daily with breakfast.       . ferrous sulfate 325 (65 FE) MG tablet Take 325 mg by mouth daily with breakfast.      . magnesium oxide (MAG-OX) 400 MG  tablet Take 400 mg by mouth daily.      . metoprolol succinate (TOPROL-XL) 25 MG 24 hr tablet Take 25 mg by mouth daily.       . Multiple Vitamin (MULTIVITAMIN) tablet Take 1 tablet by mouth daily.      Marland Kitchen oxyCODONE-acetaminophen (PERCOCET/ROXICET) 5-325 MG per tablet Take 1-2 tablets by mouth every 8 (eight) hours as needed for severe pain.  30 tablet  0  . travoprost, benzalkonium, (TRAVATAN) 0.004 % ophthalmic solution Place 1 drop into both eyes at bedtime.      . valsartan (DIOVAN) 80 MG tablet Take 1 tablet (80 mg total) by mouth daily.       No current facility-administered medications for this visit.    REVIEW OF SYSTEMS:   All other systems were reviewed with the patient and are negative.  PHYSICAL EXAMINATION: ECOG PERFORMANCE STATUS: 0 - Asymptomatic  Filed Vitals:   05/13/13 1106  BP: 168/57  Pulse: 60  Temp: 97.1 F (36.2 C)  Resp: 20   Filed Weights   05/13/13 1106  Weight: 165 lb 12.8 oz (75.206 kg)    GENERAL:alert, no distress and comfortable SKIN: skin color, texture, turgor are normal, no rashes or significant lesions. The skin over the port area is well-healed. NEURO: alert & oriented x 3 with fluent speech, no focal motor/sensory deficits  LABORATORY DATA:  I have reviewed the data as listed    Component Value Date/Time   NA 141 05/06/2013 0174  NA 142 02/26/2013 1459   K 4.0 05/06/2013 0619   K 4.2 02/26/2013 1459   CL 106 05/06/2013 0619   CO2 20 05/06/2013 0619   CO2 24 02/26/2013 1459   GLUCOSE 97 05/06/2013 0619   GLUCOSE 77 02/26/2013 1459   BUN 14 05/06/2013 0619   BUN 11.8 02/26/2013 1459   CREATININE 1.02 05/06/2013 0619   CREATININE 1.0 02/26/2013 1459   CALCIUM 9.5 05/06/2013 0619   CALCIUM 9.7 02/26/2013 1459   PROT 6.5 05/06/2013 0619   PROT 6.9 02/26/2013 1459   ALBUMIN 3.5 05/06/2013 0619   ALBUMIN 3.9 02/26/2013 1459   AST 24 05/06/2013 0619   AST 24 02/26/2013 1459   ALT 17 05/06/2013 0619   ALT 23 02/26/2013 1459   ALKPHOS 70 05/06/2013 0619   ALKPHOS  73 02/26/2013 1459   BILITOT <0.2* 05/06/2013 0619   BILITOT 0.23 02/26/2013 1459   GFRNONAA 52* 05/06/2013 0619   GFRAA 61* 05/06/2013 0619    No results found for this basename: SPEP, UPEP,  kappa and lambda light chains    Lab Results  Component Value Date   WBC 5.9 05/06/2013   NEUTROABS 4.1 02/26/2013   HGB 11.4* 05/06/2013   HCT 33.6* 05/06/2013   MCV 89.6 05/06/2013   PLT 261 05/06/2013      Chemistry      Component Value Date/Time   NA 141 05/06/2013 0619   NA 142 02/26/2013 1459   K 4.0 05/06/2013 0619   K 4.2 02/26/2013 1459   CL 106 05/06/2013 0619   CO2 20 05/06/2013 0619   CO2 24 02/26/2013 1459   BUN 14 05/06/2013 0619   BUN 11.8 02/26/2013 1459   CREATININE 1.02 05/06/2013 0619   CREATININE 1.0 02/26/2013 1459      Component Value Date/Time   CALCIUM 9.5 05/06/2013 0619   CALCIUM 9.7 02/26/2013 1459   ALKPHOS 70 05/06/2013 0619   ALKPHOS 73 02/26/2013 1459   AST 24 05/06/2013 0619   AST 24 02/26/2013 1459   ALT 17 05/06/2013 0619   ALT 23 02/26/2013 1459   BILITOT <0.2* 05/06/2013 0619   BILITOT 0.23 02/26/2013 1459      ASSESSMENT & PLAN:  #1 stage IB lung cancer I discussed with the patient and her daughter the risks, benefits and side effects of treatment. We discussed the role of chemotherapy. The intent is for cure, as adjuvant treatment.  We discussed some of the risks, benefits, side-effects of cisplatin and Navelbine. Some of the short term side-effects included, though not limited to, including weight loss, life threatening infections, risk of allergic reactions, need for transfusions of blood products, nausea, vomiting, change in bowel habits, loss of hair, admission to hospital for various reasons, and risks of death.   Long term side-effects are also discussed including risks of infertility, permanent damage to nerve function, hearing loss, chronic fatigue, kidney damage with possibility needing hemodialysis, and rare secondary malignancy including bone marrow disorders.  The  patient is aware that the response rates discussed earlier is not guaranteed.  After a long discussion, patient made an informed decision to proceed with the prescribed plan of care and went ahead to sign the consent form today.   Patient education material was dispensed. I recommend we do 2 weeks on 1 week off along with Neulasta support. The daughter will confirm the date and time to work best for her to stop treatment cycle 1. If we can get her started within next week, I  would not have to have repeat bloodwork done as the most recent bloodwork appear normal We also discussed about the role of clinical trial. She appeared to be interested to meet with our research coordinator. #2 mild anemia This is related to recent surgery. I told her to discontinue iron supplement as it can cause risk of constipation #3 hearing loss I will reduce the dose of cisplatin to reduce her risk of hearing deficit.  All questions were answered. The patient knows to call the clinic with any problems, questions or concerns. No barriers to learning was detected. I spent 40 minutes counseling the patient face to face. The total time spent in the appointment was 55 minutes and more than 50% was on counseling and review of test results     Heath Lark, MD 05/13/2013 1:13 PM   .

## 2013-05-13 NOTE — Patient Instructions (Signed)
Cisplatin injection What is this medicine? CISPLATIN (SIS pla tin) is a chemotherapy drug. It targets fast dividing cells, like cancer cells, and causes these cells to die. This medicine is used to treat many types of cancer like bladder, ovarian, and testicular cancers. This medicine may be used for other purposes; ask your health care provider or pharmacist if you have questions. COMMON BRAND NAME(S): Platinol -AQ, Platinol What should I tell my health care provider before I take this medicine? They need to know if you have any of these conditions: -blood disorders -hearing problems -kidney disease -recent or ongoing radiation therapy -an unusual or allergic reaction to cisplatin, carboplatin, other chemotherapy, other medicines, foods, dyes, or preservatives -pregnant or trying to get pregnant -breast-feeding How should I use this medicine? This drug is given as an infusion into a vein. It is administered in a hospital or clinic by a specially trained health care professional. Talk to your pediatrician regarding the use of this medicine in children. Special care may be needed. Overdosage: If you think you have taken too much of this medicine contact a poison control center or emergency room at once. NOTE: This medicine is only for you. Do not share this medicine with others. What if I miss a dose? It is important not to miss a dose. Call your doctor or health care professional if you are unable to keep an appointment. What may interact with this medicine? -dofetilide -foscarnet -medicines for seizures -medicines to increase blood counts like filgrastim, pegfilgrastim, sargramostim -probenecid -pyridoxine used with altretamine -rituximab -some antibiotics like amikacin, gentamicin, neomycin, polymyxin B, streptomycin, tobramycin -sulfinpyrazone -vaccines -zalcitabine Talk to your doctor or health care professional before taking any of these  medicines: -acetaminophen -aspirin -ibuprofen -ketoprofen -naproxen This list may not describe all possible interactions. Give your health care provider a list of all the medicines, herbs, non-prescription drugs, or dietary supplements you use. Also tell them if you smoke, drink alcohol, or use illegal drugs. Some items may interact with your medicine. What should I watch for while using this medicine? Your condition will be monitored carefully while you are receiving this medicine. You will need important blood work done while you are taking this medicine. This drug may make you feel generally unwell. This is not uncommon, as chemotherapy can affect healthy cells as well as cancer cells. Report any side effects. Continue your course of treatment even though you feel ill unless your doctor tells you to stop. In some cases, you may be given additional medicines to help with side effects. Follow all directions for their use. Call your doctor or health care professional for advice if you get a fever, chills or sore throat, or other symptoms of a cold or flu. Do not treat yourself. This drug decreases your body's ability to fight infections. Try to avoid being around people who are sick. This medicine may increase your risk to bruise or bleed. Call your doctor or health care professional if you notice any unusual bleeding. Be careful brushing and flossing your teeth or using a toothpick because you may get an infection or bleed more easily. If you have any dental work done, tell your dentist you are receiving this medicine. Avoid taking products that contain aspirin, acetaminophen, ibuprofen, naproxen, or ketoprofen unless instructed by your doctor. These medicines may hide a fever. Do not become pregnant while taking this medicine. Women should inform their doctor if they wish to become pregnant or think they might be pregnant. There is a  potential for serious side effects to an unborn child. Talk to  your health care professional or pharmacist for more information. Do not breast-feed an infant while taking this medicine. Drink fluids as directed while you are taking this medicine. This will help protect your kidneys. Call your doctor or health care professional if you get diarrhea. Do not treat yourself. What side effects may I notice from receiving this medicine? Side effects that you should report to your doctor or health care professional as soon as possible: -allergic reactions like skin rash, itching or hives, swelling of the face, lips, or tongue -signs of infection - fever or chills, cough, sore throat, pain or difficulty passing urine -signs of decreased platelets or bleeding - bruising, pinpoint red spots on the skin, black, tarry stools, nosebleeds -signs of decreased red blood cells - unusually weak or tired, fainting spells, lightheadedness -breathing problems -changes in hearing -gout pain -low blood counts - This drug may decrease the number of white blood cells, red blood cells and platelets. You may be at increased risk for infections and bleeding. -nausea and vomiting -pain, swelling, redness or irritation at the injection site -pain, tingling, numbness in the hands or feet -problems with balance, movement -trouble passing urine or change in the amount of urine Side effects that usually do not require medical attention (report to your doctor or health care professional if they continue or are bothersome): -changes in vision -loss of appetite -metallic taste in the mouth or changes in taste This list may not describe all possible side effects. Call your doctor for medical advice about side effects. You may report side effects to FDA at 1-800-FDA-1088. Where should I keep my medicine? This drug is given in a hospital or clinic and will not be stored at home. NOTE: This sheet is a summary. It may not cover all possible information. If you have questions about this medicine,  talk to your doctor, pharmacist, or health care provider.  2014, [employer]vier/Gold Standard. (2007-04-16 14:40:54) Vinorelbine injection What is this medicine? VINORELBINE (vi NOR el been) is a chemotherapy drug. It targets fast dividing cells, like cancer cells, and causes these cells to die. This medicine is used to treat cancer, like lung cancer. This medicine may be used for other purposes; ask your health care provider or pharmacist if you have questions. COMMON BRAND NAME(S): Navelbine What should I tell my health care provider before I take this medicine? They need to know if you have any of these conditions: -blood disorders -infection (especially chickenpox and herpes) -liver disease -lung disease -nervous system disease -previous or current radiation therapy -an unusual or allergic reaction to vinorelbine, other chemotherapy agents, other medicines, foods, dyes, or preservatives -pregnant or trying to get pregnant -breast-feeding How should I use this medicine? This drug is given as an infusion into a vein. It is administered in a hospital or clinic by a specially trained health care professional. If you have pain, swelling, burning or any unusual feeling around the site of your injection, tell your health care professional right away. Talk to your pediatrician regarding the use of this medicine in children. Special care may be needed. Overdosage: If you think you have taken too much of this medicine contact a poison control center or emergency room at once. NOTE: This medicine is only for you. Do not share this medicine with others. What if I miss a dose? It is important not to miss your dose. Call your doctor or health care professional if  you are unable to keep an appointment. What may interact with this medicine? Do not take this medicine with any of the following medications: -itraconazole -voriconazole This medicine may also interact with the following  medications: -cyclosporine -erythromycin -fluconazole -ketoconazole -medicines for HIV like delavirdine, efavirenz, nevirapine -medicines for seizures like ethotoin, fosphenotoin, phenytoin -medicines to increase blood counts like filgrastim, pegfilgrastim, sargramostim -other chemotherapy drugs like cisplatin, mitomycin, paclitaxel -vaccines Talk to your doctor or health care professional before taking any of these medicines: -acetaminophen -aspirin -ibuprofen -ketoprofen -naproxen This list may not describe all possible interactions. Give your health care provider a list of all the medicines, herbs, non-prescription drugs, or dietary supplements you use. Also tell them if you smoke, drink alcohol, or use illegal drugs. Some items may interact with your medicine. What should I watch for while using this medicine? Your condition will be monitored carefully while you are receiving this medicine. You will need important blood work done while you are taking this medicine. This drug may make you feel generally unwell. This is not uncommon, as chemotherapy can affect healthy cells as well as cancer cells. Report any side effects. Continue your course of treatment even though you feel ill unless your doctor tells you to stop. In some cases, you may be given additional medicines to help with side effects. Follow all directions for their use. Call your doctor or health care professional for advice if you get a fever, chills or sore throat, or other symptoms of a cold or flu. Do not treat yourself. This drug decreases your body's ability to fight infections. Try to avoid being around people who are sick. This medicine may increase your risk to bruise or bleed. Call your doctor or health care professional if you notice any unusual bleeding. Be careful brushing and flossing your teeth or using a toothpick because you may get an infection or bleed more easily. If you have any dental work done, tell your  dentist you are receiving this medicine. Avoid taking products that contain aspirin, acetaminophen, ibuprofen, naproxen, or ketoprofen unless instructed by your doctor. These medicines may hide a fever. Do not become pregnant while taking this medicine. Women should inform their doctor if they wish to become pregnant or think they might be pregnant. There is a potential for serious side effects to an unborn child. Talk to your health care professional or pharmacist for more information. Do not breast-feed an infant while taking this medicine. What side effects may I notice from receiving this medicine? Side effects that you should report to your doctor or health care professional as soon as possible: -allergic reactions like skin rash, itching or hives, swelling of the face, lips, or tongue -low blood counts - This drug may decrease the number of white blood cells, red blood cells and platelets. You may be at increased risk for infections and bleeding. -signs of infection - fever or chills, cough, sore throat, pain or difficulty passing urine -signs of decreased platelets or bleeding - bruising, pinpoint red spots on the skin, black, tarry stools, nosebleeds -signs of decreased red blood cells - unusually weak or tired, fainting spells, lightheadedness -breathing problems -chest pain -constipation -cough -mouth sores -nausea and vomiting -pain, swelling, redness or irritation at the injection site -pain, tingling, numbness in the hands or feet -stomach pain -trouble passing urine or change in the amount of urine Side effects that usually do not require medical attention (report to your doctor or health care professional if they continue or  are bothersome): -diarrhea -hair loss -jaw pain -loss of appetite This list may not describe all possible side effects. Call your doctor for medical advice about side effects. You may report side effects to FDA at 1-800-FDA-1088. Where should I keep my  medicine? This drug is given in a hospital or clinic and will not be stored at home. NOTE: This sheet is a summary. It may not cover all possible information. If you have questions about this medicine, talk to your doctor, pharmacist, or health care provider.  2014, [employer]vier/Gold Standard. (2007-10-07 17:18:15)

## 2013-05-14 ENCOUNTER — Telehealth: Payer: Self-pay | Admitting: *Deleted

## 2013-05-14 ENCOUNTER — Encounter: Payer: Self-pay | Admitting: *Deleted

## 2013-05-14 ENCOUNTER — Other Ambulatory Visit: Payer: Self-pay | Admitting: Hematology and Oncology

## 2013-05-14 ENCOUNTER — Telehealth: Payer: Self-pay | Admitting: Hematology and Oncology

## 2013-05-14 NOTE — Telephone Encounter (Signed)
Gave pt appt for lab ,md and chemo for April and MAy 2015

## 2013-05-14 NOTE — Telephone Encounter (Signed)
Per staff message and POF I have scheduled appts.  JMW  

## 2013-05-15 ENCOUNTER — Other Ambulatory Visit: Payer: Self-pay | Admitting: Hematology and Oncology

## 2013-05-15 ENCOUNTER — Telehealth: Payer: Self-pay | Admitting: *Deleted

## 2013-05-15 NOTE — Telephone Encounter (Signed)
Daughter had questions about nausea meds. Waiting for authorization for zofran. Instructed her to pick up other meds.

## 2013-05-19 ENCOUNTER — Telehealth: Payer: Self-pay | Admitting: *Deleted

## 2013-05-19 NOTE — Telephone Encounter (Signed)
Daughter states pt was unable to get zofran filled at pharmacy.  The pharmacy told them insurance would not cover it.  Informed dau nurse will f/u,  To see if it needs a prior auth.  She verbalized understanding.

## 2013-05-20 ENCOUNTER — Telehealth: Payer: Self-pay | Admitting: *Deleted

## 2013-05-20 DIAGNOSIS — K644 Residual hemorrhoidal skin tags: Secondary | ICD-10-CM | POA: Diagnosis not present

## 2013-05-20 DIAGNOSIS — D0220 Carcinoma in situ of unspecified bronchus and lung: Secondary | ICD-10-CM | POA: Diagnosis not present

## 2013-05-20 DIAGNOSIS — Z79899 Other long term (current) drug therapy: Secondary | ICD-10-CM | POA: Diagnosis not present

## 2013-05-20 DIAGNOSIS — Z87891 Personal history of nicotine dependence: Secondary | ICD-10-CM | POA: Diagnosis not present

## 2013-05-20 NOTE — Telephone Encounter (Signed)
Called [facility] to ask about Zofran.  Dau stated insurance is not covering this medication. [facility] state it requires a prior auth and will fax [place] the information.

## 2013-05-20 NOTE — Telephone Encounter (Signed)
Dau reports that pt c/o a sore "blister" just outside her rectum.  Pt has been constipated but took some senna and having softer stools now.  Pt is cleaning area w/ peroxide.  Dau asks if this will interfere w/ pt's chemo?  Instructed dau that she should call pt's PCP to assess.  It is likely a hemorrhoid or irritation from being constipated.  Instructed for pt to take stool softeners or mild laxative such as miralax to keep stool soft.  Keep area clean w soap and water.  May try OTC hemorrhoid cream to see if it helps but needs to be seen by PCP to make sure it is not infected cyst.  It should not interfere w/ getting chemotherapy tomorrow unless PCP says it is infected then please let us know.  Dau verbalized understanding.

## 2013-05-21 ENCOUNTER — Ambulatory Visit (HOSPITAL_BASED_OUTPATIENT_CLINIC_OR_DEPARTMENT_OTHER): Payer: Medicare Other

## 2013-05-21 ENCOUNTER — Encounter: Payer: Self-pay | Admitting: *Deleted

## 2013-05-21 VITALS — BP 190/70 | HR 65 | Temp 97.7°F | Resp 20

## 2013-05-21 DIAGNOSIS — C3490 Malignant neoplasm of unspecified part of unspecified bronchus or lung: Secondary | ICD-10-CM

## 2013-05-21 DIAGNOSIS — Z5111 Encounter for antineoplastic chemotherapy: Secondary | ICD-10-CM

## 2013-05-21 DIAGNOSIS — C3430 Malignant neoplasm of lower lobe, unspecified bronchus or lung: Secondary | ICD-10-CM | POA: Diagnosis not present

## 2013-05-21 MED ORDER — SODIUM CHLORIDE 0.9 % IV SOLN
Freq: Once | INTRAVENOUS | Status: AC
  Administered 2013-05-21: 09:00:00 via INTRAVENOUS

## 2013-05-21 MED ORDER — DEXAMETHASONE SODIUM PHOSPHATE 20 MG/5ML IJ SOLN
12.0000 mg | Freq: Once | INTRAMUSCULAR | Status: AC
  Administered 2013-05-21: 12 mg via INTRAVENOUS

## 2013-05-21 MED ORDER — FOSAPREPITANT DIMEGLUMINE INJECTION 150 MG
150.0000 mg | Freq: Once | INTRAVENOUS | Status: AC
  Administered 2013-05-21: 150 mg via INTRAVENOUS
  Filled 2013-05-21: qty 5

## 2013-05-21 MED ORDER — DEXAMETHASONE SODIUM PHOSPHATE 20 MG/5ML IJ SOLN
INTRAMUSCULAR | Status: AC
  Filled 2013-05-21: qty 5

## 2013-05-21 MED ORDER — VINORELBINE TARTRATE CHEMO INJECTION 50 MG/5ML
50.0000 mg | INTRAVENOUS | Status: DC
  Administered 2013-05-21: 50 mg via INTRAVENOUS
  Filled 2013-05-21: qty 5

## 2013-05-21 MED ORDER — PALONOSETRON HCL INJECTION 0.25 MG/5ML
0.2500 mg | Freq: Once | INTRAVENOUS | Status: AC
  Administered 2013-05-21: 0.25 mg via INTRAVENOUS

## 2013-05-21 MED ORDER — SODIUM CHLORIDE 0.9 % IV SOLN
56.2500 mg/m2 | Freq: Once | INTRAVENOUS | Status: AC
  Administered 2013-05-21: 100 mg via INTRAVENOUS
  Filled 2013-05-21: qty 100

## 2013-05-21 MED ORDER — MANNITOL 25 % IV SOLN
Freq: Once | INTRAVENOUS | Status: AC
  Administered 2013-05-21: 09:00:00 via INTRAVENOUS
  Filled 2013-05-21: qty 10

## 2013-05-21 MED ORDER — PALONOSETRON HCL INJECTION 0.25 MG/5ML
INTRAVENOUS | Status: AC
  Filled 2013-05-21: qty 5

## 2013-05-21 MED ORDER — SODIUM CHLORIDE 0.9 % IJ SOLN
10.0000 mL | INTRAMUSCULAR | Status: DC | PRN
  Administered 2013-05-21: 10 mL
  Filled 2013-05-21: qty 10

## 2013-05-21 MED ORDER — HEPARIN SOD (PORK) LOCK FLUSH 100 UNIT/ML IV SOLN
500.0000 [IU] | Freq: Once | INTRAVENOUS | Status: AC | PRN
  Administered 2013-05-21: 500 [IU]
  Filled 2013-05-21: qty 5

## 2013-05-21 NOTE — Patient Instructions (Addendum)
[facility] Discharge Instructions for Patients Receiving Chemotherapy  Today you received the following chemotherapy agents : cisplatin & navelbine  To help prevent nausea and vomiting after your treatment, we encourage you to take your nausea medications as directed:  Compazine 10 mg every 6 hours as needed-take a dose tonight and again in am, then as needed  Zofran 8 mg twice daily as needed for nausea (start this on Friday if needed).  *OK to take both medications if needed after Friday.  If you develop nausea and vomiting that is not controlled by your nausea medication, call the clinic.   Push po fluids  BELOW ARE SYMPTOMS THAT SHOULD BE REPORTED IMMEDIATELY:  *FEVER GREATER THAN 100.5 F  *CHILLS WITH OR WITHOUT FEVER  NAUSEA AND VOMITING THAT IS NOT CONTROLLED WITH YOUR NAUSEA MEDICATION  *UNUSUAL SHORTNESS OF BREATH  *UNUSUAL BRUISING OR BLEEDING  TENDERNESS IN MOUTH AND THROAT WITH OR WITHOUT PRESENCE OF ULCERS  *URINARY PROBLEMS  *BOWEL PROBLEMS  UNUSUAL RASH Items with * indicate a potential emergency and should be followed up as soon as possible.  For constipation-suggest Senokot-S : take 1-2 /daily and can add MiraLax 17 grams daily (mixed in 8 ounces of liquid)  Feel free to call the clinic should you have any questions or concerns. The clinic phone number is [phone].  It has been a pleasure to serve you today!

## 2013-05-21 NOTE — Progress Notes (Signed)
@   1005-voided 200 cc clear, yellow urine. She had also voided unmeasured amount upon arrival to treatment area.  Total urine output 1200 cc today.

## 2013-05-22 ENCOUNTER — Telehealth: Payer: Self-pay | Admitting: *Deleted

## 2013-05-22 ENCOUNTER — Encounter: Payer: Self-pay | Admitting: Hematology and Oncology

## 2013-05-22 NOTE — Telephone Encounter (Signed)
Called [facility] for chemotherapy F/U.  Spoke with daughter [name] with patient audible in background.  Patient is doing well.  Denies n/v.  But inquires about zofran prior authorization.  Denies any new side effects or symptoms.  Bowels moved last on 05-20-2013.  Asked if okay to use CITROM because this works when she takes this once a week.  Asked names of laxatives nurse gave yesterday.  Senokot-S and mirilax information given.  Bladder is functioning well.  Eating and drinking well and I instructed to drink 64 oz minimum daily or at least the day before, of and after treatment.  Questions use of compazine and Percocet interfering with her regular medicines.  Encouraged to take senokot daily due to pan medicine use.   Urged to call if needed.  Reviewed how to call after hours in the case of an emergency.

## 2013-05-22 NOTE — Telephone Encounter (Signed)
[facility] at [facility]. faxed Prior authorization request for Zofran.  Request to Man[employer] for review.

## 2013-05-22 NOTE — Progress Notes (Signed)
[place], [phone] opt 1 opt 1, approved ondansetron from 05/22/13-01/22/14

## 2013-05-23 ENCOUNTER — Telehealth: Payer: Self-pay | Admitting: *Deleted

## 2013-05-23 NOTE — Telephone Encounter (Signed)
[name] called to report she was notified of medication approved by insurance.  Informed her to call [facility] and let them know the Zofran has been approved.  [facility] has the prescription and should be able to run it through insurance now.  Call us back if any problems.  She verbalized understanding.

## 2013-05-27 ENCOUNTER — Other Ambulatory Visit: Payer: Self-pay | Admitting: *Deleted

## 2013-05-27 DIAGNOSIS — C3490 Malignant neoplasm of unspecified part of unspecified bronchus or lung: Secondary | ICD-10-CM

## 2013-05-28 ENCOUNTER — Telehealth: Payer: Self-pay | Admitting: *Deleted

## 2013-05-28 ENCOUNTER — Other Ambulatory Visit: Payer: Self-pay | Admitting: Hematology and Oncology

## 2013-05-28 ENCOUNTER — Ambulatory Visit (HOSPITAL_BASED_OUTPATIENT_CLINIC_OR_DEPARTMENT_OTHER): Payer: Medicare Other

## 2013-05-28 ENCOUNTER — Ambulatory Visit (HOSPITAL_BASED_OUTPATIENT_CLINIC_OR_DEPARTMENT_OTHER): Payer: Medicare Other | Admitting: Hematology and Oncology

## 2013-05-28 ENCOUNTER — Telehealth: Payer: Self-pay | Admitting: Hematology and Oncology

## 2013-05-28 ENCOUNTER — Other Ambulatory Visit (HOSPITAL_BASED_OUTPATIENT_CLINIC_OR_DEPARTMENT_OTHER): Payer: Medicare Other

## 2013-05-28 VITALS — BP 140/74 | HR 68 | Temp 97.7°F | Resp 20 | Ht 60.0 in | Wt 161.2 lb

## 2013-05-28 DIAGNOSIS — D630 Anemia in neoplastic disease: Secondary | ICD-10-CM

## 2013-05-28 DIAGNOSIS — N189 Chronic kidney disease, unspecified: Secondary | ICD-10-CM

## 2013-05-28 DIAGNOSIS — Z5111 Encounter for antineoplastic chemotherapy: Secondary | ICD-10-CM | POA: Diagnosis not present

## 2013-05-28 DIAGNOSIS — K5900 Constipation, unspecified: Secondary | ICD-10-CM | POA: Diagnosis not present

## 2013-05-28 DIAGNOSIS — D631 Anemia in chronic kidney disease: Secondary | ICD-10-CM | POA: Insufficient documentation

## 2013-05-28 DIAGNOSIS — C3430 Malignant neoplasm of lower lobe, unspecified bronchus or lung: Secondary | ICD-10-CM

## 2013-05-28 DIAGNOSIS — D649 Anemia, unspecified: Secondary | ICD-10-CM

## 2013-05-28 DIAGNOSIS — C3490 Malignant neoplasm of unspecified part of unspecified bronchus or lung: Secondary | ICD-10-CM

## 2013-05-28 LAB — COMPREHENSIVE METABOLIC PANEL (CC13)
ALBUMIN: 3.5 g/dL (ref 3.5–5.0)
ALK PHOS: 65 U/L (ref 40–150)
ALT: 17 U/L (ref 0–55)
AST: 17 U/L (ref 5–34)
Anion Gap: 8 mEq/L (ref 3–11)
BUN: 13.8 mg/dL (ref 7.0–26.0)
CO2: 25 mEq/L (ref 22–29)
Calcium: 9.2 mg/dL (ref 8.4–10.4)
Chloride: 110 mEq/L — ABNORMAL HIGH (ref 98–109)
Creatinine: 1.2 mg/dL — ABNORMAL HIGH (ref 0.6–1.1)
Glucose: 113 mg/dl (ref 70–140)
POTASSIUM: 3.9 meq/L (ref 3.5–5.1)
Sodium: 143 mEq/L (ref 136–145)
Total Bilirubin: 0.25 mg/dL (ref 0.20–1.20)
Total Protein: 6.2 g/dL — ABNORMAL LOW (ref 6.4–8.3)

## 2013-05-28 LAB — CBC WITH DIFFERENTIAL/PLATELET
BASO%: 1 % (ref 0.0–2.0)
BASOS ABS: 0 10*3/uL (ref 0.0–0.1)
EOS%: 3.2 % (ref 0.0–7.0)
Eosinophils Absolute: 0.1 10*3/uL (ref 0.0–0.5)
HCT: 31.5 % — ABNORMAL LOW (ref 34.8–46.6)
HEMOGLOBIN: 10.6 g/dL — AB (ref 11.6–15.9)
LYMPH%: 23.6 % (ref 14.0–49.7)
MCH: 29.9 pg (ref 25.1–34.0)
MCHC: 33.7 g/dL (ref 31.5–36.0)
MCV: 88.9 fL (ref 79.5–101.0)
MONO#: 0.3 10*3/uL (ref 0.1–0.9)
MONO%: 5.5 % (ref 0.0–14.0)
NEUT#: 3.1 10*3/uL (ref 1.5–6.5)
NEUT%: 66.7 % (ref 38.4–76.8)
Platelets: 256 10*3/uL (ref 145–400)
RBC: 3.55 10*6/uL — ABNORMAL LOW (ref 3.70–5.45)
RDW: 15.1 % — AB (ref 11.2–14.5)
WBC: 4.7 10*3/uL (ref 3.9–10.3)
lymph#: 1.1 10*3/uL (ref 0.9–3.3)

## 2013-05-28 LAB — RESEARCH LABS

## 2013-05-28 MED ORDER — HEPARIN SOD (PORK) LOCK FLUSH 100 UNIT/ML IV SOLN
500.0000 [IU] | Freq: Once | INTRAVENOUS | Status: AC | PRN
  Administered 2013-05-28: 500 [IU]
  Filled 2013-05-28: qty 5

## 2013-05-28 MED ORDER — ONDANSETRON 8 MG/50ML IVPB (CHCC)
8.0000 mg | Freq: Once | INTRAVENOUS | Status: AC
  Administered 2013-05-28: 8 mg via INTRAVENOUS

## 2013-05-28 MED ORDER — DEXAMETHASONE SODIUM PHOSPHATE 20 MG/5ML IJ SOLN
12.0000 mg | Freq: Once | INTRAMUSCULAR | Status: AC
  Administered 2013-05-28: 12 mg via INTRAVENOUS

## 2013-05-28 MED ORDER — LIDOCAINE-PRILOCAINE 2.5-2.5 % EX CREA
TOPICAL_CREAM | CUTANEOUS | Status: AC
  Filled 2013-05-28: qty 5

## 2013-05-28 MED ORDER — SODIUM CHLORIDE 0.9 % IV SOLN
Freq: Once | INTRAVENOUS | Status: AC
  Administered 2013-05-28: 12:00:00 via INTRAVENOUS

## 2013-05-28 MED ORDER — SODIUM CHLORIDE 0.9 % IJ SOLN
10.0000 mL | INTRAMUSCULAR | Status: DC | PRN
  Administered 2013-05-28: 10 mL
  Filled 2013-05-28: qty 10

## 2013-05-28 MED ORDER — ONDANSETRON 8 MG/NS 50 ML IVPB
INTRAVENOUS | Status: AC
  Filled 2013-05-28: qty 8

## 2013-05-28 MED ORDER — DEXAMETHASONE SODIUM PHOSPHATE 20 MG/5ML IJ SOLN
INTRAMUSCULAR | Status: AC
  Filled 2013-05-28: qty 5

## 2013-05-28 MED ORDER — VINORELBINE TARTRATE CHEMO INJECTION 50 MG/5ML
28.0000 mg/m2 | INTRAVENOUS | Status: DC
  Administered 2013-05-28: 50 mg via INTRAVENOUS
  Filled 2013-05-28: qty 5

## 2013-05-28 NOTE — Telephone Encounter (Signed)
Per staff message and POF I have scheduled appts.  JMW  

## 2013-05-28 NOTE — Progress Notes (Signed)
[facility] OFFICE PROGRESS NOTE  Patient Care Team: Glendale Chard, MD as PCP - General (Internal Medicine) Heath Lark, MD as Consulting Physician (Hematology and Oncology) [name], MD as Consulting Physician (Cardiothoracic Surgery)  DIAGNOSIS: Lung cancer, seen prior to cycle 1 day 8 chemotherapy  SUMMARY OF ONCOLOGIC HISTORY: Oncology History   Non-small cell lung cancer, adenocarcinoma   Primary site: Lung (Left)   Staging method: AJCC 7th Edition   Clinical: Stage IB (T2a, N0, M0) signed by Heath Lark, MD on 04/29/2013  9:01 PM   Pathologic: Stage IB (T2a, N0, cM0) signed by [name], MD on 04/09/2013 12:51 PM   Summary: Stage IB (T2a, N0, cM0)       Non-small cell lung cancer   02/26/2013 Imaging CXR for evaluation of cough showed new left lung mass   02/28/2013 Imaging CT chest showed left new lung nodule   03/14/2013 Imaging PET/CT scan showed localized disease in the left lung   04/07/2013 Surgery She underwent bronchoscopy and left lower lobe resection and lymph node sampling with negative margins   05/06/2013 Procedure The patient has placement of Infuse-a-Port    INTERVAL HISTORY: [name] 78 y.o. female returns for further followup. The patient has consented to participate in research. With her last week treatment, she has significant constipation, resolved with laxatives. She is a complaint of some altered taste sensation and reduced appetite. She has lost 3 pounds of weight. She denies any mucositis or nausea.  I have reviewed the past medical history, past surgical history, social history and family history with the patient and they are unchanged from previous note.  ALLERGIES:  is allergic to aspirin; lactose intolerance (gi); lisinopril; and adhesive.  MEDICATIONS:  Current Outpatient Prescriptions  Medication Sig Dispense Refill  . Cholecalciferol (VITAMIN D-3) 1000 UNITS CAPS Take 1 capsule by mouth daily.      . clopidogrel  (PLAVIX) 75 MG tablet Take 75 mg by mouth daily with breakfast.       . lidocaine-prilocaine (EMLA) cream Apply 1 application topically as needed.  30 g  0  . magnesium oxide (MAG-OX) 400 MG tablet Take 400 mg by mouth daily.      . metoprolol succinate (TOPROL-XL) 25 MG 24 hr tablet Take 25 mg by mouth daily.       . Multiple Vitamin (MULTIVITAMIN) tablet Take 1 tablet by mouth daily.      Marland Kitchen oxyCODONE-acetaminophen (PERCOCET/ROXICET) 5-325 MG per tablet Take 1-2 tablets by mouth every 8 (eight) hours as needed for severe pain.  30 tablet  0  . prochlorperazine (COMPAZINE) 10 MG tablet Take 1 tablet (10 mg total) by mouth every 6 (six) hours as needed (Nausea or vomiting).  30 tablet  1  . PROCTOZONE-HC 2.5 % rectal cream       . sennosides-docusate sodium (SENOKOT-S) 8.6-50 MG tablet Take 1 tablet by mouth daily.      . travoprost, benzalkonium, (TRAVATAN) 0.004 % ophthalmic solution Place 1 drop into both eyes at bedtime.      . valsartan (DIOVAN) 80 MG tablet Take 1 tablet (80 mg total) by mouth daily.      . ondansetron (ZOFRAN) 8 MG tablet Take 1 tablet (8 mg total) by mouth 2 (two) times daily as needed. Start on the third day after chemotherapy.  30 tablet  1   No current facility-administered medications for this visit.    REVIEW OF SYSTEMS:   Constitutional: Denies fevers, chills  Eyes:  Denies blurriness of vision Ears, nose, mouth, throat, and face: Denies mucositis or sore throat Respiratory: Denies cough, dyspnea or wheezes Cardiovascular: Denies palpitation, chest discomfort or lower extremity swelling Skin: Denies abnormal skin rashes Lymphatics: Denies new lymphadenopathy or easy bruising Neurological:Denies numbness, tingling or new weaknesses Behavioral/Psych: Mood is stable, no new changes  All other systems were reviewed with the patient and are negative.  PHYSICAL EXAMINATION: ECOG PERFORMANCE STATUS: 1 - Symptomatic but completely ambulatory  Filed Vitals:    05/28/13 1044  BP: 140/74  Pulse: 68  Temp: 97.7 F (36.5 C)  Resp: 20   Filed Weights   05/28/13 1044  Weight: 161 lb 3.2 oz (73.12 kg)    GENERAL:alert, no distress and comfortable SKIN: skin color, texture, turgor are normal, no rashes or significant lesions EYES: normal, Conjunctiva are pink and non-injected, sclera clear OROPHARYNX:no exudate, no erythema and lips, buccal mucosa, and tongue normal  NECK: supple, thyroid normal size, non-tender, without nodularity LYMPH:  no palpable lymphadenopathy in the cervical, axillary or inguinal LUNGS: clear to auscultation and percussion with normal breathing effort HEART: regular rate & rhythm and no murmurs and no lower extremity edema ABDOMEN:abdomen soft, non-tender and normal bowel sounds Musculoskeletal:no cyanosis of digits and no clubbing  NEURO: alert & oriented x 3 with fluent speech, no focal motor/sensory deficits  LABORATORY DATA:  I have reviewed the data as listed    Component Value Date/Time   NA 143 05/28/2013 1032   NA 141 05/06/2013 0619   K 3.9 05/28/2013 1032   K 4.0 05/06/2013 0619   CL 106 05/06/2013 0619   CO2 25 05/28/2013 1032   CO2 20 05/06/2013 0619   GLUCOSE 113 05/28/2013 1032   GLUCOSE 97 05/06/2013 0619   BUN 13.8 05/28/2013 1032   BUN 14 05/06/2013 0619   CREATININE 1.2* 05/28/2013 1032   CREATININE 1.02 05/06/2013 0619   CALCIUM 9.2 05/28/2013 1032   CALCIUM 9.5 05/06/2013 0619   PROT 6.2* 05/28/2013 1032   PROT 6.5 05/06/2013 0619   ALBUMIN 3.5 05/28/2013 1032   ALBUMIN 3.5 05/06/2013 0619   AST 17 05/28/2013 1032   AST 24 05/06/2013 0619   ALT 17 05/28/2013 1032   ALT 17 05/06/2013 0619   ALKPHOS 65 05/28/2013 1032   ALKPHOS 70 05/06/2013 0619   BILITOT 0.25 05/28/2013 1032   BILITOT <0.2* 05/06/2013 0619   GFRNONAA 52* 05/06/2013 0619   GFRAA 61* 05/06/2013 0619    No results found for this basename: SPEP, UPEP,  kappa and lambda light chains    Lab Results  Component Value Date   WBC 4.7 05/28/2013   NEUTROABS  3.1 05/28/2013   HGB 10.6* 05/28/2013   HCT 31.5* 05/28/2013   MCV 88.9 05/28/2013   PLT 256 05/28/2013      Chemistry      Component Value Date/Time   NA 143 05/28/2013 1032   NA 141 05/06/2013 0619   K 3.9 05/28/2013 1032   K 4.0 05/06/2013 0619   CL 106 05/06/2013 0619   CO2 25 05/28/2013 1032   CO2 20 05/06/2013 0619   BUN 13.8 05/28/2013 1032   BUN 14 05/06/2013 0619   CREATININE 1.2* 05/28/2013 1032   CREATININE 1.02 05/06/2013 0619      Component Value Date/Time   CALCIUM 9.2 05/28/2013 1032   CALCIUM 9.5 05/06/2013 0619   ALKPHOS 65 05/28/2013 1032   ALKPHOS 70 05/06/2013 0619   AST 17 05/28/2013 1032   AST 24 05/06/2013  0619   ALT 17 05/28/2013 1032   ALT 17 05/06/2013 0619   BILITOT 0.25 05/28/2013 1032   BILITOT <0.2* 05/06/2013 0619     ASSESSMENT & PLAN:  #1 stage IB lung cancer Overall, the patient tolerated treatment well apart from minor side effects. I will continue current treatment without dosage adjustment. #2 mild anemia This is related to recent surgery. I told her to discontinue iron supplement as it can cause risk of constipation #3 hearing loss I will reduce the dose of cisplatin to reduce her risk of hearing deficit. #4 constipation I recommend she increase laxative #5 Mildly elevated serum creatinine This could be due to mild dehydration. I recommend observation for now. #6 mild weight loss #7 altered taste sensation This is all related to chemotherapy. The patient does not appear malnourished I will observe for now.  Orders Placed This Encounter  Procedures  . CBC with Differential    Standing Status: Standing     Number of Occurrences: 9     Standing Expiration Date: 05/29/2014  . Comprehensive metabolic panel    Standing Status: Standing     Number of Occurrences: 9     Standing Expiration Date: 05/29/2014  . Magnesium    Standing Status: Future     Number of Occurrences:      Standing Expiration Date: 05/28/2014   All questions were answered. The patient knows to  call the clinic with any problems, questions or concerns. No barriers to learning was detected.    Heath Lark, MD 05/28/2013 11:19 AM

## 2013-05-28 NOTE — Telephone Encounter (Signed)
Gave pt appt for lab and MD for May, emailed michelle regarding chemo

## 2013-05-28 NOTE — Patient Instructions (Signed)
[facility] Discharge Instructions for Patients Receiving Chemotherapy  Today you received the following chemotherapy agent: Navelbine  To help prevent nausea and vomiting after your treatment, we encourage you to take your nausea medication as prescribed.    If you develop nausea and vomiting that is not controlled by your nausea medication, call the clinic.   BELOW ARE SYMPTOMS THAT SHOULD BE REPORTED IMMEDIATELY:  *FEVER GREATER THAN 100.5 F  *CHILLS WITH OR WITHOUT FEVER  NAUSEA AND VOMITING THAT IS NOT CONTROLLED WITH YOUR NAUSEA MEDICATION  *UNUSUAL SHORTNESS OF BREATH  *UNUSUAL BRUISING OR BLEEDING  TENDERNESS IN MOUTH AND THROAT WITH OR WITHOUT PRESENCE OF ULCERS  *URINARY PROBLEMS  *BOWEL PROBLEMS  UNUSUAL RASH Items with * indicate a potential emergency and should be followed up as soon as possible.  Feel free to call the clinic you have any questions or concerns. The clinic phone number is [phone].

## 2013-05-29 ENCOUNTER — Ambulatory Visit (HOSPITAL_BASED_OUTPATIENT_CLINIC_OR_DEPARTMENT_OTHER): Payer: Medicare Other

## 2013-05-29 ENCOUNTER — Telehealth: Payer: Self-pay | Admitting: *Deleted

## 2013-05-29 VITALS — BP 119/54 | HR 67 | Temp 97.9°F

## 2013-05-29 DIAGNOSIS — C3430 Malignant neoplasm of lower lobe, unspecified bronchus or lung: Secondary | ICD-10-CM

## 2013-05-29 DIAGNOSIS — C3490 Malignant neoplasm of unspecified part of unspecified bronchus or lung: Secondary | ICD-10-CM

## 2013-05-29 DIAGNOSIS — Z5189 Encounter for other specified aftercare: Secondary | ICD-10-CM

## 2013-05-29 DIAGNOSIS — D630 Anemia in neoplastic disease: Secondary | ICD-10-CM

## 2013-05-29 MED ORDER — PEGFILGRASTIM INJECTION 6 MG/0.6ML
6.0000 mg | Freq: Once | SUBCUTANEOUS | Status: AC
  Administered 2013-05-29: 6 mg via SUBCUTANEOUS
  Filled 2013-05-29: qty 0.6

## 2013-05-29 NOTE — Telephone Encounter (Signed)
[name] here for Neulasta injection following 1st navel chemo treatment.  States that she is doing well.  Only slight nausea, no vomiting or diarrhea.  Encouraged her to use the antiemetics.  Is drinking lots and eating small amounts.  All questions answered.  Knows to call if she has any problems or concerns.

## 2013-05-29 NOTE — Patient Instructions (Signed)

## 2013-06-02 ENCOUNTER — Telehealth: Payer: Self-pay | Admitting: Hematology and Oncology

## 2013-06-02 ENCOUNTER — Telehealth: Payer: Self-pay | Admitting: Dietician

## 2013-06-02 NOTE — Telephone Encounter (Signed)
Brief Outpatient Oncology Nutrition Note  Patient has been identified to be at risk on malnutrition screen.  Wt Readings from Last 10 Encounters:  05/28/13 161 lb 3.2 oz (73.12 kg)  05/13/13 165 lb 12.8 oz (75.206 kg)  05/06/13 164 lb (74.39 kg)  05/06/13 164 lb (74.39 kg)  05/01/13 164 lb (74.39 kg)  04/29/13 164 lb (74.39 kg)  04/11/13 169 lb 5 oz (76.8 kg)  04/11/13 169 lb 5 oz (76.8 kg)  04/03/13 172 lb 3.2 oz (78.109 kg)  03/31/13 175 lb (79.379 kg)      Dx:  Non-small cell lung cancer, adenocarcinoma stage 1B..  Patient of Dr. Lucas Mallow.  Called patient due to weight loss.  Patient with a decreased appetite, early satiety and taste changes.  Spoke with daughter who stated that patient will eat 4-5 bites and then get very full.  Does drink Ensure at times.    Discussed importance of eating high quality nutrition every 2-3 hours including Ensure 2-3 daily.  Daughter requested an appointment with the dietitian.  Appointment referral made.  Will mail coupons for Ensure, handouts, "making the most of each bite" and "taste and smell changes" along with the contact information for the [place] [facility].  Antonieta Iba, RD, LDN

## 2013-06-02 NOTE — Telephone Encounter (Signed)
Talked to pt and she is aware of appt for Nutrition

## 2013-06-02 NOTE — Telephone Encounter (Signed)
Talked to pt, she is aware of appt for May lab,md and chemo

## 2013-06-10 ENCOUNTER — Ambulatory Visit: Payer: Medicare Other

## 2013-06-11 ENCOUNTER — Ambulatory Visit (HOSPITAL_BASED_OUTPATIENT_CLINIC_OR_DEPARTMENT_OTHER): Payer: Medicare Other | Admitting: Hematology and Oncology

## 2013-06-11 ENCOUNTER — Ambulatory Visit: Payer: Medicare Other | Admitting: Nutrition

## 2013-06-11 ENCOUNTER — Telehealth: Payer: Self-pay | Admitting: Hematology and Oncology

## 2013-06-11 ENCOUNTER — Ambulatory Visit (HOSPITAL_BASED_OUTPATIENT_CLINIC_OR_DEPARTMENT_OTHER): Payer: Medicare Other

## 2013-06-11 ENCOUNTER — Other Ambulatory Visit (HOSPITAL_BASED_OUTPATIENT_CLINIC_OR_DEPARTMENT_OTHER): Payer: Medicare Other

## 2013-06-11 VITALS — BP 144/70 | HR 69 | Temp 98.1°F | Resp 20 | Ht 60.0 in | Wt 160.9 lb

## 2013-06-11 DIAGNOSIS — C3430 Malignant neoplasm of lower lobe, unspecified bronchus or lung: Secondary | ICD-10-CM

## 2013-06-11 DIAGNOSIS — H109 Unspecified conjunctivitis: Secondary | ICD-10-CM | POA: Diagnosis not present

## 2013-06-11 DIAGNOSIS — D649 Anemia, unspecified: Secondary | ICD-10-CM | POA: Diagnosis not present

## 2013-06-11 DIAGNOSIS — C3490 Malignant neoplasm of unspecified part of unspecified bronchus or lung: Secondary | ICD-10-CM

## 2013-06-11 DIAGNOSIS — Z5111 Encounter for antineoplastic chemotherapy: Secondary | ICD-10-CM

## 2013-06-11 LAB — CBC WITH DIFFERENTIAL/PLATELET
BASO%: 0.5 % (ref 0.0–2.0)
BASOS ABS: 0 10*3/uL (ref 0.0–0.1)
EOS%: 0.5 % (ref 0.0–7.0)
Eosinophils Absolute: 0 10*3/uL (ref 0.0–0.5)
HEMATOCRIT: 30.7 % — AB (ref 34.8–46.6)
HEMOGLOBIN: 10.2 g/dL — AB (ref 11.6–15.9)
LYMPH%: 14.7 % (ref 14.0–49.7)
MCH: 30.6 pg (ref 25.1–34.0)
MCHC: 33.3 g/dL (ref 31.5–36.0)
MCV: 91.7 fL (ref 79.5–101.0)
MONO#: 1.3 10*3/uL — AB (ref 0.1–0.9)
MONO%: 14.4 % — ABNORMAL HIGH (ref 0.0–14.0)
NEUT#: 6.3 10*3/uL (ref 1.5–6.5)
NEUT%: 69.9 % (ref 38.4–76.8)
Platelets: 240 10*3/uL (ref 145–400)
RBC: 3.34 10*6/uL — ABNORMAL LOW (ref 3.70–5.45)
RDW: 16.5 % — ABNORMAL HIGH (ref 11.2–14.5)
WBC: 9 10*3/uL (ref 3.9–10.3)
lymph#: 1.3 10*3/uL (ref 0.9–3.3)

## 2013-06-11 LAB — COMPREHENSIVE METABOLIC PANEL (CC13)
ALT: 19 U/L (ref 0–55)
AST: 23 U/L (ref 5–34)
Albumin: 3.7 g/dL (ref 3.5–5.0)
Alkaline Phosphatase: 88 U/L (ref 40–150)
Anion Gap: 13 mEq/L — ABNORMAL HIGH (ref 3–11)
BUN: 11.6 mg/dL (ref 7.0–26.0)
CO2: 18 mEq/L — ABNORMAL LOW (ref 22–29)
CREATININE: 1.1 mg/dL (ref 0.6–1.1)
Calcium: 9.4 mg/dL (ref 8.4–10.4)
Chloride: 115 mEq/L — ABNORMAL HIGH (ref 98–109)
Glucose: 103 mg/dl (ref 70–140)
Potassium: 3.9 mEq/L (ref 3.5–5.1)
Sodium: 146 mEq/L — ABNORMAL HIGH (ref 136–145)
Total Bilirubin: 0.25 mg/dL (ref 0.20–1.20)
Total Protein: 6.7 g/dL (ref 6.4–8.3)

## 2013-06-11 LAB — MAGNESIUM (CC13): Magnesium: 2.1 mg/dl (ref 1.5–2.5)

## 2013-06-11 MED ORDER — VINORELBINE TARTRATE CHEMO INJECTION 50 MG/5ML
28.0000 mg/m2 | INTRAVENOUS | Status: DC
  Administered 2013-06-11: 50 mg via INTRAVENOUS
  Filled 2013-06-11: qty 5

## 2013-06-11 MED ORDER — TOBRAMYCIN 0.3 % OP SOLN: 1.0000 [drp] | OPHTHALMIC | Status: DC

## 2013-06-11 MED ORDER — HEPARIN SOD (PORK) LOCK FLUSH 100 UNIT/ML IV SOLN
500.0000 [IU] | Freq: Once | INTRAVENOUS | Status: AC | PRN
  Administered 2013-06-11: 500 [IU]
  Filled 2013-06-11: qty 5

## 2013-06-11 MED ORDER — SODIUM CHLORIDE 0.9 % IV SOLN
Freq: Once | INTRAVENOUS | Status: AC
  Administered 2013-06-11: 10:00:00 via INTRAVENOUS

## 2013-06-11 MED ORDER — POTASSIUM CHLORIDE 2 MEQ/ML IV SOLN
Freq: Once | INTRAVENOUS | Status: AC
  Administered 2013-06-11: 10:00:00 via INTRAVENOUS
  Filled 2013-06-11: qty 10

## 2013-06-11 MED ORDER — PALONOSETRON HCL INJECTION 0.25 MG/5ML
0.2500 mg | Freq: Once | INTRAVENOUS | Status: AC
  Administered 2013-06-11: 0.25 mg via INTRAVENOUS

## 2013-06-11 MED ORDER — SODIUM CHLORIDE 0.9 % IJ SOLN
10.0000 mL | INTRAMUSCULAR | Status: DC | PRN
  Administered 2013-06-11: 10 mL
  Filled 2013-06-11: qty 10

## 2013-06-11 MED ORDER — SODIUM CHLORIDE 0.9 % IV SOLN
150.0000 mg | Freq: Once | INTRAVENOUS | Status: AC
  Administered 2013-06-11: 150 mg via INTRAVENOUS
  Filled 2013-06-11: qty 5

## 2013-06-11 MED ORDER — DEXAMETHASONE SODIUM PHOSPHATE 20 MG/5ML IJ SOLN
INTRAMUSCULAR | Status: AC
  Filled 2013-06-11: qty 5

## 2013-06-11 MED ORDER — PALONOSETRON HCL INJECTION 0.25 MG/5ML
INTRAVENOUS | Status: AC
  Filled 2013-06-11: qty 5

## 2013-06-11 MED ORDER — DEXAMETHASONE SODIUM PHOSPHATE 20 MG/5ML IJ SOLN
12.0000 mg | Freq: Once | INTRAMUSCULAR | Status: AC
  Administered 2013-06-11: 12 mg via INTRAVENOUS

## 2013-06-11 MED ORDER — CISPLATIN CHEMO INJECTION 100MG/100ML
56.2500 mg/m2 | Freq: Once | INTRAVENOUS | Status: AC
  Administered 2013-06-11: 100 mg via INTRAVENOUS
  Filled 2013-06-11: qty 100

## 2013-06-11 NOTE — Telephone Encounter (Signed)
gv and printed appts ched and avs for pt for May adn June....sed added tx.

## 2013-06-11 NOTE — Progress Notes (Signed)
Patient is a 78 year old female diagnosed with non-small cell lung cancer.  She is a patient of Dr. Alvy Bimler.  Past medical history includes neurologic disorder, vitamin D deficiency, hypertension, and stage III chronic kidney disease.  Medications include vitamin D, magnesium oxide, multivitamin, Zofran, Compazine, and Senokot, S.  Labs include creatinine 1.1, Na 146 on May 20.  Height: 60 inches. Weight: 160.9 pounds on May 20. Usual body weight 175 pounds March 2015. BMI: 31.42.  Patient reports her appetite is poor.  She does describe nausea but has taken her nausea medication, however, not consistently.  Patient reports she needed to lose weight.  She does not drink regular milk, secondary to lactose intolerance.  However, she does enjoy Allmond milk.  She drinks boost oral nutrition supplements.  Patient is positive for 8 percent weight loss over 2 months.  Nutrition focused physical exam was deferred.   Nutrition diagnosis: Unintended weight loss related to poor appetite and inadequate oral intake as evidenced by 15 pound weight loss in 2 months.  Intervention: Patient educated to be sure nausea is controlled by taking nausea medications as prescribed.  Reviewed diet for nausea/vomiting.  Encouraged frequent small meals and snacks throughout the day.  Recommended patient increase Boost or ensure as desired for additional calories and protein between meals.  Discouraged weight loss during treatment.  Provided fact sheets and coupons.  Questions were answered.  Teach back method used.  Monitoring, evaluation, goals: Patient will tolerate increased calories and protein to minimize further weight loss.  Next visit: Wednesday, June 10, during chemotherapy.

## 2013-06-11 NOTE — Progress Notes (Signed)
[place] OFFICE PROGRESS NOTE  Patient Care Team: Glendale Chard, MD as PCP - General (Internal Medicine) Heath Lark, MD as Consulting Physician (Hematology and Oncology) [name], MD as Consulting Physician (Cardiothoracic Surgery)  DIAGNOSIS: Lung cancer, seen prior to cycle 2 of treatment  SUMMARY OF ONCOLOGIC HISTORY: Oncology History   Non-small cell lung cancer, adenocarcinoma   Primary site: Lung (Left)   Staging method: AJCC 7th Edition   Clinical: Stage IB (T2a, N0, M0) signed by Heath Lark, MD on 04/29/2013  9:01 PM   Pathologic: Stage IB (T2a, N0, cM0) signed by [name], MD on 04/09/2013 12:51 PM   Summary: Stage IB (T2a, N0, cM0)       Non-small cell lung cancer   02/26/2013 Imaging CXR for evaluation of cough showed new left lung mass   02/28/2013 Imaging CT chest showed left new lung nodule   03/14/2013 Imaging PET/CT scan showed localized disease in the left lung   04/07/2013 Surgery She underwent bronchoscopy and left lower lobe resection and lymph node sampling with negative margins   05/06/2013 Procedure The patient has placement of Infuse-a-Port   05/21/2013 -  Chemotherapy She is started on adjuvant chemotherapy with navelbine and cisplatin    INTERVAL HISTORY: [name] 79 y.o. female returns for further followup. She tolerated treatment very well. She denies any constipation. Denies any peripheral neuropathy or worsening hearing loss. Her only main complaint is mild incisional pain but she declined the need for strong pain medicine. She also has right eye conjunctivitis without change in her vision.  I have reviewed the past medical history, past surgical history, social history and family history with the patient and they are unchanged from previous note.  ALLERGIES:  is allergic to aspirin; lactose intolerance (gi); lisinopril; and adhesive.  MEDICATIONS:  Current Outpatient Prescriptions  Medication Sig Dispense Refill   . Cholecalciferol (VITAMIN D-3) 1000 UNITS CAPS Take 1 capsule by mouth daily.      . clopidogrel (PLAVIX) 75 MG tablet Take 75 mg by mouth daily with breakfast.       . lidocaine-prilocaine (EMLA) cream Apply 1 application topically as needed.  30 g  0  . magnesium oxide (MAG-OX) 400 MG tablet Take 400 mg by mouth daily.      . metoprolol succinate (TOPROL-XL) 25 MG 24 hr tablet Take 25 mg by mouth daily.       . Multiple Vitamin (MULTIVITAMIN) tablet Take 1 tablet by mouth daily.      . ondansetron (ZOFRAN) 8 MG tablet Take 1 tablet (8 mg total) by mouth 2 (two) times daily as needed. Start on the third day after chemotherapy.  30 tablet  1  . oxyCODONE-acetaminophen (PERCOCET/ROXICET) 5-325 MG per tablet Take 1-2 tablets by mouth every 8 (eight) hours as needed for severe pain.  30 tablet  0  . prochlorperazine (COMPAZINE) 10 MG tablet Take 1 tablet (10 mg total) by mouth every 6 (six) hours as needed (Nausea or vomiting).  30 tablet  1  . PROCTOZONE-HC 2.5 % rectal cream       . sennosides-docusate sodium (SENOKOT-S) 8.6-50 MG tablet Take 1 tablet by mouth daily.      . travoprost, benzalkonium, (TRAVATAN) 0.004 % ophthalmic solution Place 1 drop into both eyes at bedtime.      . valsartan (DIOVAN) 80 MG tablet Take 1 tablet (80 mg total) by mouth daily.      Marland Kitchen tobramycin (TOBREX) 0.3 % ophthalmic solution  Place 1 drop into the right eye every 4 (four) hours.  5 mL  0   No current facility-administered medications for this visit.   Facility-Administered Medications Ordered in Other Visits  Medication Dose Route Frequency Provider Last Rate Last Dose  . 0.9 %  sodium chloride infusion   Intravenous Once Heath Lark, MD      . dextrose 5 % and 0.45% NaCl 1,000 mL with potassium chloride 20 mEq, magnesium sulfate 12 mEq, mannitol 12.5 g infusion   Intravenous Once Heath Lark, MD      . heparin lock flush 100 unit/mL  500 Units Intracatheter Once PRN Heath Lark, MD      . sodium chloride 0.9 %  injection 10 mL  10 mL Intracatheter PRN Heath Lark, MD        REVIEW OF SYSTEMS:   Constitutional: Denies fevers, chills or abnormal weight loss Ears, nose, mouth, throat, and face: Denies mucositis or sore throat Respiratory: Denies cough, dyspnea or wheezes Cardiovascular: Denies palpitation, chest discomfort or lower extremity swelling Gastrointestinal:  Denies nausea, heartburn or change in bowel habits Skin: Denies abnormal skin rashes Lymphatics: Denies new lymphadenopathy or easy bruising Neurological:Denies numbness, tingling or new weaknesses Behavioral/Psych: Mood is stable, no new changes  All other systems were reviewed with the patient and are negative.  PHYSICAL EXAMINATION: ECOG PERFORMANCE STATUS: 0 - Asymptomatic  Filed Vitals:   06/11/13 0828  BP: 144/70  Pulse: 69  Temp: 98.1 F (36.7 C)  Resp: 20   Filed Weights   06/11/13 0828  Weight: 160 lb 14.4 oz (72.984 kg)    GENERAL:alert, no distress and comfortable SKIN: skin color, texture, turgor are normal, no rashes or significant lesions EYES: She has right eye conjunctivitis. Normal eye appearance on the left. OROPHARYNX:no exudate, no erythema and lips, buccal mucosa, and tongue normal  NECK: supple, thyroid normal size, non-tender, without nodularity LYMPH:  no palpable lymphadenopathy in the cervical, axillary or inguinal LUNGS: clear to auscultation and percussion with normal breathing effort HEART: regular rate & rhythm and no murmurs and no lower extremity edema ABDOMEN:abdomen soft, non-tender and normal bowel sounds Musculoskeletal:no cyanosis of digits and no clubbing  NEURO: alert & oriented x 3 with fluent speech, no focal motor/sensory deficits  LABORATORY DATA:  I have reviewed the data as listed    Component Value Date/Time   NA 146* 06/11/2013 0815   NA 141 05/06/2013 0619   K 3.9 06/11/2013 0815   K 4.0 05/06/2013 0619   CL 106 05/06/2013 0619   CO2 18* 06/11/2013 0815   CO2 20  05/06/2013 0619   GLUCOSE 103 06/11/2013 0815   GLUCOSE 97 05/06/2013 0619   BUN 11.6 06/11/2013 0815   BUN 14 05/06/2013 0619   CREATININE 1.1 06/11/2013 0815   CREATININE 1.02 05/06/2013 0619   CALCIUM 9.4 06/11/2013 0815   CALCIUM 9.5 05/06/2013 0619   PROT 6.7 06/11/2013 0815   PROT 6.5 05/06/2013 0619   ALBUMIN 3.7 06/11/2013 0815   ALBUMIN 3.5 05/06/2013 0619   AST 23 06/11/2013 0815   AST 24 05/06/2013 0619   ALT 19 06/11/2013 0815   ALT 17 05/06/2013 0619   ALKPHOS 88 06/11/2013 0815   ALKPHOS 70 05/06/2013 0619   BILITOT 0.25 06/11/2013 0815   BILITOT <0.2* 05/06/2013 0619   GFRNONAA 52* 05/06/2013 0619   GFRAA 61* 05/06/2013 0619    No results found for this basename: SPEP,  UPEP,   kappa and lambda light chains  Lab Results  Component Value Date   WBC 9.0 06/11/2013   NEUTROABS 6.3 06/11/2013   HGB 10.2* 06/11/2013   HCT 30.7* 06/11/2013   MCV 91.7 06/11/2013   PLT 240 06/11/2013      Chemistry      Component Value Date/Time   NA 146* 06/11/2013 0815   NA 141 05/06/2013 0619   K 3.9 06/11/2013 0815   K 4.0 05/06/2013 0619   CL 106 05/06/2013 0619   CO2 18* 06/11/2013 0815   CO2 20 05/06/2013 0619   BUN 11.6 06/11/2013 0815   BUN 14 05/06/2013 0619   CREATININE 1.1 06/11/2013 0815   CREATININE 1.02 05/06/2013 0619      Component Value Date/Time   CALCIUM 9.4 06/11/2013 0815   CALCIUM 9.5 05/06/2013 0619   ALKPHOS 88 06/11/2013 0815   ALKPHOS 70 05/06/2013 0619   AST 23 06/11/2013 0815   AST 24 05/06/2013 0619   ALT 19 06/11/2013 0815   ALT 17 05/06/2013 0619   BILITOT 0.25 06/11/2013 0815   BILITOT <0.2* 05/06/2013 0619     ASSESSMENT & PLAN:  #1 stage IB lung cancer Overall, the patient tolerated treatment well apart from minor side effects. I will continue current treatment without dosage adjustment. The dose of cisplatin was reduced due to mild baseline hearing loss. #2 mild anemia This is likely due to recent treatment. The patient denies recent history of bleeding such as  epistaxis, hematuria or hematochezia. She is asymptomatic from the anemia. I will observe for now.  She does not require transfusion now. I will continue the chemotherapy at current dose without dosage adjustment.  If the anemia gets progressive worse in the future, I might have to delay her treatment or adjust the chemotherapy dose. #3 hearing loss I will continue on reduced dose of cisplatin to reduce her risk of further hearing deficit. #4 constipation I recommend she increase laxative #5 Mildly elevated serum creatinine, resolved This could be due to mild dehydration. I recommend observation for now.9 #6 right eye conjunctivitis I prescribed eye drops for her to take. #7 incisional pain I discussed about various other option but the patient declined pain medicine.  Orders Placed This Encounter  Procedures  . Magnesium    Standing Status: Future     Number of Occurrences:      Standing Expiration Date: 06/11/2014   All questions were answered. The patient knows to call the clinic with any problems, questions or concerns. No barriers to learning was detected.    Heath Lark, MD 06/11/2013 9:37 AM

## 2013-06-11 NOTE — Patient Instructions (Signed)
[facility] Discharge Instructions for Patients Receiving Chemotherapy  Today you received the following chemotherapy agents :  Cisplatin,  Navelbine.  To help prevent nausea and vomiting after your treatment, we encourage you to take your nausea medication as prescribed by your physician.  Take Zofran 8mg  by mouth two times daily for nausea ( start on third day after chemo ) for 2 days and as needed for nausea.   If you develop nausea and vomiting that is not controlled by your nausea medication, call the clinic.   BELOW ARE SYMPTOMS THAT SHOULD BE REPORTED IMMEDIATELY:  *FEVER GREATER THAN 100.5 F  *CHILLS WITH OR WITHOUT FEVER  NAUSEA AND VOMITING THAT IS NOT CONTROLLED WITH YOUR NAUSEA MEDICATION  *UNUSUAL SHORTNESS OF BREATH  *UNUSUAL BRUISING OR BLEEDING  TENDERNESS IN MOUTH AND THROAT WITH OR WITHOUT PRESENCE OF ULCERS  *URINARY PROBLEMS  *BOWEL PROBLEMS  UNUSUAL RASH Items with * indicate a potential emergency and should be followed up as soon as possible.  Feel free to call the clinic you have any questions or concerns. The clinic phone number is [phone].

## 2013-06-11 NOTE — Progress Notes (Signed)
Pt voided  300 + cc of urine pre CDDP;  Voided  100cc during CDDP;  425cc  Post  CDDP.

## 2013-06-17 ENCOUNTER — Ambulatory Visit: Payer: Medicare Other

## 2013-06-18 ENCOUNTER — Telehealth: Payer: Self-pay | Admitting: *Deleted

## 2013-06-18 ENCOUNTER — Telehealth: Payer: Self-pay | Admitting: Hematology and Oncology

## 2013-06-18 ENCOUNTER — Other Ambulatory Visit: Payer: Self-pay | Admitting: Hematology and Oncology

## 2013-06-18 ENCOUNTER — Other Ambulatory Visit (HOSPITAL_BASED_OUTPATIENT_CLINIC_OR_DEPARTMENT_OTHER): Payer: Medicare Other

## 2013-06-18 ENCOUNTER — Ambulatory Visit (HOSPITAL_BASED_OUTPATIENT_CLINIC_OR_DEPARTMENT_OTHER): Payer: Medicare Other

## 2013-06-18 VITALS — BP 129/81 | HR 67 | Temp 97.7°F | Resp 19

## 2013-06-18 DIAGNOSIS — C3430 Malignant neoplasm of lower lobe, unspecified bronchus or lung: Secondary | ICD-10-CM

## 2013-06-18 DIAGNOSIS — Z5111 Encounter for antineoplastic chemotherapy: Secondary | ICD-10-CM | POA: Diagnosis not present

## 2013-06-18 DIAGNOSIS — C3490 Malignant neoplasm of unspecified part of unspecified bronchus or lung: Secondary | ICD-10-CM

## 2013-06-18 LAB — COMPREHENSIVE METABOLIC PANEL (CC13)
ALBUMIN: 3.7 g/dL (ref 3.5–5.0)
ALK PHOS: 73 U/L (ref 40–150)
ALT: 13 U/L (ref 0–55)
AST: 16 U/L (ref 5–34)
Anion Gap: 8 mEq/L (ref 3–11)
BUN: 14.5 mg/dL (ref 7.0–26.0)
CO2: 23 mEq/L (ref 22–29)
Calcium: 9.6 mg/dL (ref 8.4–10.4)
Chloride: 110 mEq/L — ABNORMAL HIGH (ref 98–109)
Creatinine: 1.1 mg/dL (ref 0.6–1.1)
Glucose: 97 mg/dl (ref 70–140)
POTASSIUM: 4.4 meq/L (ref 3.5–5.1)
SODIUM: 141 meq/L (ref 136–145)
Total Bilirubin: 0.29 mg/dL (ref 0.20–1.20)
Total Protein: 6.4 g/dL (ref 6.4–8.3)

## 2013-06-18 LAB — CBC WITH DIFFERENTIAL/PLATELET
BASO%: 3.1 % — ABNORMAL HIGH (ref 0.0–2.0)
Basophils Absolute: 0.1 10*3/uL (ref 0.0–0.1)
EOS ABS: 0 10*3/uL (ref 0.0–0.5)
EOS%: 0.5 % (ref 0.0–7.0)
HCT: 28.1 % — ABNORMAL LOW (ref 34.8–46.6)
HGB: 9.3 g/dL — ABNORMAL LOW (ref 11.6–15.9)
LYMPH#: 0.9 10*3/uL (ref 0.9–3.3)
LYMPH%: 34.9 % (ref 14.0–49.7)
MCH: 29.6 pg (ref 25.1–34.0)
MCHC: 32.9 g/dL (ref 31.5–36.0)
MCV: 90 fL (ref 79.5–101.0)
MONO#: 0.1 10*3/uL (ref 0.1–0.9)
MONO%: 5 % (ref 0.0–14.0)
NEUT%: 56.5 % (ref 38.4–76.8)
NEUTROS ABS: 1.5 10*3/uL (ref 1.5–6.5)
Platelets: 257 10*3/uL (ref 145–400)
RBC: 3.12 10*6/uL — ABNORMAL LOW (ref 3.70–5.45)
RDW: 16.3 % — AB (ref 11.2–14.5)
WBC: 2.6 10*3/uL — ABNORMAL LOW (ref 3.9–10.3)

## 2013-06-18 MED ORDER — DEXAMETHASONE SODIUM PHOSPHATE 20 MG/5ML IJ SOLN
INTRAMUSCULAR | Status: AC
  Filled 2013-06-18: qty 5

## 2013-06-18 MED ORDER — DEXAMETHASONE SODIUM PHOSPHATE 20 MG/5ML IJ SOLN
12.0000 mg | Freq: Once | INTRAMUSCULAR | Status: AC
  Administered 2013-06-18: 12 mg via INTRAVENOUS

## 2013-06-18 MED ORDER — ONDANSETRON 8 MG/NS 50 ML IVPB
INTRAVENOUS | Status: AC
Start: 2013-06-18 — End: 2013-06-18
  Filled 2013-06-18: qty 8

## 2013-06-18 MED ORDER — SODIUM CHLORIDE 0.9 % IJ SOLN
10.0000 mL | INTRAMUSCULAR | Status: DC | PRN
  Administered 2013-06-18: 10 mL
  Filled 2013-06-18: qty 10

## 2013-06-18 MED ORDER — MAGIC MOUTHWASH W/LIDOCAINE: 5.0000 mL | Freq: Four times a day (QID) | ORAL | Status: DC

## 2013-06-18 MED ORDER — ONDANSETRON 8 MG/50ML IVPB (CHCC)
8.0000 mg | Freq: Once | INTRAVENOUS | Status: AC
  Administered 2013-06-18: 8 mg via INTRAVENOUS

## 2013-06-18 MED ORDER — VINORELBINE TARTRATE CHEMO INJECTION 50 MG/5ML
28.0000 mg/m2 | INTRAVENOUS | Status: DC
  Administered 2013-06-18: 50 mg via INTRAVENOUS
  Filled 2013-06-18: qty 5

## 2013-06-18 MED ORDER — HEPARIN SOD (PORK) LOCK FLUSH 100 UNIT/ML IV SOLN
500.0000 [IU] | Freq: Once | INTRAVENOUS | Status: AC | PRN
  Administered 2013-06-18: 500 [IU]
  Filled 2013-06-18: qty 5

## 2013-06-18 MED ORDER — SODIUM CHLORIDE 0.9 % IV SOLN
Freq: Once | INTRAVENOUS | Status: AC
  Administered 2013-06-18: 15:00:00 via INTRAVENOUS

## 2013-06-18 NOTE — Telephone Encounter (Signed)
, °

## 2013-06-18 NOTE — Patient Instructions (Signed)
[facility] Discharge Instructions for Patients Receiving Chemotherapy  Today you received the following chemotherapy agents: Navelbine  To help prevent nausea and vomiting after your treatment, we encourage you to take your nausea medication: Compazine 10 mg every 6 hrs as needed.    If you develop nausea and vomiting that is not controlled by your nausea medication, call the clinic.   BELOW ARE SYMPTOMS THAT SHOULD BE REPORTED IMMEDIATELY:  *FEVER GREATER THAN 100.5 F  *CHILLS WITH OR WITHOUT FEVER  NAUSEA AND VOMITING THAT IS NOT CONTROLLED WITH YOUR NAUSEA MEDICATION  *UNUSUAL SHORTNESS OF BREATH  *UNUSUAL BRUISING OR BLEEDING  TENDERNESS IN MOUTH AND THROAT WITH OR WITHOUT PRESENCE OF ULCERS  *URINARY PROBLEMS  *BOWEL PROBLEMS  UNUSUAL RASH Items with * indicate a potential emergency and should be followed up as soon as possible.  Feel free to call the clinic you have any questions or concerns. The clinic phone number is [phone].

## 2013-06-18 NOTE — Progress Notes (Signed)
Per Dr. Alvy Bimler, okay to proceed with tx without CMET results today. CMET reviewed prior to tx, values within treatment parameters.

## 2013-06-18 NOTE — Telephone Encounter (Signed)
Daughter called this morning to report pt has been feeling very bad since last Thursday.  She also has mouth sores.  Daughter asks if Dr. Alvy Bimler can see pt today.   Dr. Alvy Bimler will see pt in infusion room today.  Notified daughter and she verbalized understanding.  Notified infusion RN to alert Dr. Alvy Bimler when pt is here.

## 2013-06-19 ENCOUNTER — Ambulatory Visit (HOSPITAL_BASED_OUTPATIENT_CLINIC_OR_DEPARTMENT_OTHER): Payer: Medicare Other

## 2013-06-19 VITALS — BP 136/71 | HR 71 | Temp 97.9°F

## 2013-06-19 DIAGNOSIS — C3490 Malignant neoplasm of unspecified part of unspecified bronchus or lung: Secondary | ICD-10-CM | POA: Diagnosis not present

## 2013-06-19 DIAGNOSIS — Z5189 Encounter for other specified aftercare: Secondary | ICD-10-CM

## 2013-06-19 DIAGNOSIS — D630 Anemia in neoplastic disease: Secondary | ICD-10-CM

## 2013-06-19 MED ORDER — PEGFILGRASTIM INJECTION 6 MG/0.6ML
6.0000 mg | Freq: Once | SUBCUTANEOUS | Status: AC
  Administered 2013-06-19: 6 mg via SUBCUTANEOUS
  Filled 2013-06-19: qty 0.6

## 2013-06-19 NOTE — Patient Instructions (Signed)

## 2013-06-24 ENCOUNTER — Telehealth: Payer: Self-pay | Admitting: *Deleted

## 2013-06-24 NOTE — Telephone Encounter (Signed)
   Provider input needed: Chemotherapy symptoms, side effects   Reason for call: Mom is not herself and I need to know what to do for her  Constitutional: positive for fatigue and malaise possibly dizzy, not eating much, not sleeping well, uncomfortable   ALLERGIES:  is allergic to aspirin; lactose intolerance (gi); lisinopril; and adhesive.  Patient last received chemotherapy/ treatment on 06-18-2013  Patient was last seen in the office on 06-11-2013  Next appt is 07-02-2013  Is patient having fevers greater than 100.5?  "no, T = 94.7"   Is patient having uncontrolled pain, or new pain? no   Is patient having new back pain that changes with position (worsens or eases when laying down?)  no   Is patient able to eat and drink? yes, "She doesn't eat much"    Is patient able to pass stool without difficulty?   yes, She uses senokot-S"     Is patient having uncontrolled nausea?  No, "She has nausea but takes the anti-emetic and has not vomited,"    Daughter JO calls 06/24/2013 with complaint of  Constitutional: positive for fatigue, malaise and Chemo received 06-18-2013 with injection 06-19-2013.  Mom has not been eating much.  She drinks water all day long.  She sleeps until 0100 and then she is up all night unable to get comfortable.  She's said she feels like she is going to pass out, legs weak at times which worries me.  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  The side effects of treatment are worse than the cancer.  She is tired.  What can I do because she may want to give this up because she feels so bad."      Summary Based on the above information advised family to  Await return phone call as I share this information with provider.  [phone] is the return number for patient's daughter [name].    [name]  06/24/2013, 12:06 PM   Background Info  [name]   DOB: [birth year]   MR#: [id number]   CSN#    [id number] 06/24/2013

## 2013-06-24 NOTE — Telephone Encounter (Signed)
Called patient as ordered by Dr. Alvy Bimler.  Instructed that these are not unlikely symptoms.  Instructed to let her body rest and heal from the recent chemotherapy received.  Thermometer on hand.  Instructed joe to call if mom develops any signs of infection.  No questions.  Thanked me for the return call.

## 2013-06-25 ENCOUNTER — Other Ambulatory Visit: Payer: Self-pay | Admitting: Hematology and Oncology

## 2013-06-25 ENCOUNTER — Telehealth: Payer: Self-pay | Admitting: *Deleted

## 2013-06-25 DIAGNOSIS — G8918 Other acute postprocedural pain: Secondary | ICD-10-CM

## 2013-06-25 MED ORDER — OXYCODONE-ACETAMINOPHEN 5-325 MG PO TABS: 1.0000 | ORAL_TABLET | Freq: Three times a day (TID) | ORAL | Status: DC | PRN

## 2013-06-25 NOTE — Telephone Encounter (Signed)
Dau states pt needs refill on Percocet.  She is taking for pain at her surgical site left chest and pain radiates to under left breast.  They called Dr. Everrett Coombe office for refill and were told to ask Dr. Alvy Bimler to manage pt's pain.

## 2013-06-25 NOTE — Telephone Encounter (Signed)
LVM for daughter informing of Rx ready to pick up.

## 2013-06-27 ENCOUNTER — Other Ambulatory Visit: Payer: Self-pay | Admitting: Hematology and Oncology

## 2013-07-01 ENCOUNTER — Encounter (HOSPITAL_COMMUNITY): Payer: Self-pay

## 2013-07-01 ENCOUNTER — Ambulatory Visit (HOSPITAL_COMMUNITY)
Admission: RE | Admit: 2013-07-01 | Discharge: 2013-07-01 | Disposition: A | Discharge status: Home / Self Care | Payer: Medicare Other | Source: Ambulatory Visit | Attending: Hematology and Oncology | Admitting: Hematology and Oncology

## 2013-07-01 DIAGNOSIS — J438 Other emphysema: Secondary | ICD-10-CM | POA: Diagnosis not present

## 2013-07-01 DIAGNOSIS — R0602 Shortness of breath: Secondary | ICD-10-CM | POA: Diagnosis not present

## 2013-07-01 DIAGNOSIS — R634 Abnormal weight loss: Secondary | ICD-10-CM | POA: Insufficient documentation

## 2013-07-01 DIAGNOSIS — Z85118 Personal history of other malignant neoplasm of bronchus and lung: Secondary | ICD-10-CM | POA: Diagnosis not present

## 2013-07-01 DIAGNOSIS — C3490 Malignant neoplasm of unspecified part of unspecified bronchus or lung: Secondary | ICD-10-CM

## 2013-07-01 HISTORY — DX: Malignant (primary) neoplasm, unspecified: C80.1

## 2013-07-01 MED ORDER — IOHEXOL 300 MG/ML  SOLN
80.0000 mL | Freq: Once | INTRAMUSCULAR | Status: AC | PRN
  Administered 2013-07-01: 80 mL via INTRAVENOUS

## 2013-07-02 ENCOUNTER — Telehealth: Payer: Self-pay | Admitting: Hematology and Oncology

## 2013-07-02 ENCOUNTER — Ambulatory Visit: Payer: Medicare Other | Admitting: Nutrition

## 2013-07-02 ENCOUNTER — Ambulatory Visit (HOSPITAL_BASED_OUTPATIENT_CLINIC_OR_DEPARTMENT_OTHER): Payer: Medicare Other

## 2013-07-02 ENCOUNTER — Ambulatory Visit (HOSPITAL_BASED_OUTPATIENT_CLINIC_OR_DEPARTMENT_OTHER): Payer: Medicare Other | Admitting: Hematology and Oncology

## 2013-07-02 ENCOUNTER — Ambulatory Visit: Payer: Medicare Other

## 2013-07-02 ENCOUNTER — Telehealth: Payer: Self-pay | Admitting: *Deleted

## 2013-07-02 ENCOUNTER — Other Ambulatory Visit (HOSPITAL_BASED_OUTPATIENT_CLINIC_OR_DEPARTMENT_OTHER): Payer: Medicare Other

## 2013-07-02 ENCOUNTER — Ambulatory Visit (HOSPITAL_COMMUNITY)
Admission: RE | Admit: 2013-07-02 | Discharge: 2013-07-02 | Disposition: A | Discharge status: Home / Self Care | Payer: Medicare Other | Source: Ambulatory Visit | Attending: Hematology and Oncology | Admitting: Hematology and Oncology

## 2013-07-02 VITALS — BP 144/58 | HR 62 | Temp 97.7°F | Resp 20 | Ht 60.0 in | Wt 158.5 lb

## 2013-07-02 DIAGNOSIS — R0989 Other specified symptoms and signs involving the circulatory and respiratory systems: Secondary | ICD-10-CM

## 2013-07-02 DIAGNOSIS — Z5111 Encounter for antineoplastic chemotherapy: Secondary | ICD-10-CM

## 2013-07-02 DIAGNOSIS — D630 Anemia in neoplastic disease: Secondary | ICD-10-CM | POA: Insufficient documentation

## 2013-07-02 DIAGNOSIS — C3410 Malignant neoplasm of upper lobe, unspecified bronchus or lung: Secondary | ICD-10-CM

## 2013-07-02 DIAGNOSIS — R5383 Other fatigue: Secondary | ICD-10-CM

## 2013-07-02 DIAGNOSIS — L7682 Other postprocedural complications of skin and subcutaneous tissue: Secondary | ICD-10-CM

## 2013-07-02 DIAGNOSIS — C3490 Malignant neoplasm of unspecified part of unspecified bronchus or lung: Secondary | ICD-10-CM

## 2013-07-02 DIAGNOSIS — R5381 Other malaise: Secondary | ICD-10-CM

## 2013-07-02 DIAGNOSIS — R0609 Other forms of dyspnea: Secondary | ICD-10-CM

## 2013-07-02 DIAGNOSIS — Z95828 Presence of other vascular implants and grafts: Secondary | ICD-10-CM

## 2013-07-02 DIAGNOSIS — C3430 Malignant neoplasm of lower lobe, unspecified bronchus or lung: Secondary | ICD-10-CM

## 2013-07-02 DIAGNOSIS — R52 Pain, unspecified: Secondary | ICD-10-CM

## 2013-07-02 LAB — COMPREHENSIVE METABOLIC PANEL (CC13)
ALT: 15 U/L (ref 0–55)
AST: 22 U/L (ref 5–34)
Albumin: 3.4 g/dL — ABNORMAL LOW (ref 3.5–5.0)
Alkaline Phosphatase: 94 U/L (ref 40–150)
Anion Gap: 7 mEq/L (ref 3–11)
BILIRUBIN TOTAL: 0.23 mg/dL (ref 0.20–1.20)
BUN: 9.3 mg/dL (ref 7.0–26.0)
CO2: 25 mEq/L (ref 22–29)
Calcium: 8.8 mg/dL (ref 8.4–10.4)
Chloride: 111 mEq/L — ABNORMAL HIGH (ref 98–109)
Creatinine: 1.2 mg/dL — ABNORMAL HIGH (ref 0.6–1.1)
Glucose: 95 mg/dl (ref 70–140)
Potassium: 4.1 mEq/L (ref 3.5–5.1)
SODIUM: 143 meq/L (ref 136–145)
TOTAL PROTEIN: 5.8 g/dL — AB (ref 6.4–8.3)

## 2013-07-02 LAB — CBC WITH DIFFERENTIAL/PLATELET
BASO%: 1.1 % (ref 0.0–2.0)
Basophils Absolute: 0.1 10*3/uL (ref 0.0–0.1)
EOS%: 0.3 % (ref 0.0–7.0)
Eosinophils Absolute: 0 10*3/uL (ref 0.0–0.5)
HCT: 25.1 % — ABNORMAL LOW (ref 34.8–46.6)
HGB: 8 g/dL — ABNORMAL LOW (ref 11.6–15.9)
LYMPH%: 14.1 % (ref 14.0–49.7)
MCH: 29.9 pg (ref 25.1–34.0)
MCHC: 31.8 g/dL (ref 31.5–36.0)
MCV: 93.9 fL (ref 79.5–101.0)
MONO#: 1.4 10*3/uL — ABNORMAL HIGH (ref 0.1–0.9)
MONO%: 11.9 % (ref 0.0–14.0)
NEUT#: 8.3 10*3/uL — ABNORMAL HIGH (ref 1.5–6.5)
NEUT%: 72.6 % (ref 38.4–76.8)
Platelets: 183 10*3/uL (ref 145–400)
RBC: 2.67 10*6/uL — AB (ref 3.70–5.45)
RDW: 20.4 % — ABNORMAL HIGH (ref 11.2–14.5)
WBC: 11.4 10*3/uL — ABNORMAL HIGH (ref 3.9–10.3)
lymph#: 1.6 10*3/uL (ref 0.9–3.3)

## 2013-07-02 LAB — ABO/RH: ABO/RH(D): B POS

## 2013-07-02 LAB — MAGNESIUM (CC13): MAGNESIUM: 1.9 mg/dL (ref 1.5–2.5)

## 2013-07-02 MED ORDER — DEXAMETHASONE SODIUM PHOSPHATE 20 MG/5ML IJ SOLN
12.0000 mg | Freq: Once | INTRAMUSCULAR | Status: AC
  Administered 2013-07-02: 12 mg via INTRAVENOUS

## 2013-07-02 MED ORDER — SODIUM CHLORIDE 0.9 % IV SOLN
37.5000 mg/m2 | Freq: Once | INTRAVENOUS | Status: AC
  Administered 2013-07-02: 67 mg via INTRAVENOUS
  Filled 2013-07-02: qty 67

## 2013-07-02 MED ORDER — POTASSIUM CHLORIDE 2 MEQ/ML IV SOLN
Freq: Once | INTRAVENOUS | Status: AC
  Administered 2013-07-02: 11:00:00 via INTRAVENOUS
  Filled 2013-07-02: qty 10

## 2013-07-02 MED ORDER — SODIUM CHLORIDE 0.9 % IJ SOLN
10.0000 mL | INTRAMUSCULAR | Status: DC | PRN
  Filled 2013-07-02: qty 10

## 2013-07-02 MED ORDER — PALONOSETRON HCL INJECTION 0.25 MG/5ML
INTRAVENOUS | Status: AC
  Filled 2013-07-02: qty 5

## 2013-07-02 MED ORDER — SODIUM CHLORIDE 0.9 % IJ SOLN
10.0000 mL | INTRAMUSCULAR | Status: DC | PRN
  Administered 2013-07-02: 10 mL
  Filled 2013-07-02: qty 10

## 2013-07-02 MED ORDER — SODIUM CHLORIDE 0.9 % IV SOLN
150.0000 mg | Freq: Once | INTRAVENOUS | Status: AC
  Administered 2013-07-02: 150 mg via INTRAVENOUS
  Filled 2013-07-02: qty 5

## 2013-07-02 MED ORDER — HEPARIN SOD (PORK) LOCK FLUSH 100 UNIT/ML IV SOLN
500.0000 [IU] | Freq: Once | INTRAVENOUS | Status: AC | PRN
  Administered 2013-07-02: 500 [IU]
  Filled 2013-07-02: qty 5

## 2013-07-02 MED ORDER — PALONOSETRON HCL INJECTION 0.25 MG/5ML
0.2500 mg | Freq: Once | INTRAVENOUS | Status: AC
  Administered 2013-07-02: 0.25 mg via INTRAVENOUS

## 2013-07-02 MED ORDER — HEPARIN SOD (PORK) LOCK FLUSH 100 UNIT/ML IV SOLN
500.0000 [IU] | Freq: Once | INTRAVENOUS | Status: AC
  Administered 2013-07-02: 500 [IU] via INTRAVENOUS
  Filled 2013-07-02: qty 5

## 2013-07-02 MED ORDER — SODIUM CHLORIDE 0.9 % IV SOLN
Freq: Once | INTRAVENOUS | Status: AC
  Administered 2013-07-02: 10:00:00 via INTRAVENOUS

## 2013-07-02 MED ORDER — VINORELBINE TARTRATE CHEMO INJECTION 50 MG/5ML
50.0000 mg | INTRAVENOUS | Status: DC
  Administered 2013-07-02: 50 mg via INTRAVENOUS
  Filled 2013-07-02: qty 5

## 2013-07-02 MED ORDER — DEXAMETHASONE SODIUM PHOSPHATE 20 MG/5ML IJ SOLN
INTRAMUSCULAR | Status: AC
  Filled 2013-07-02: qty 5

## 2013-07-02 NOTE — Telephone Encounter (Signed)
Per staff phone call and POF I have schedueld appts.  JMW  

## 2013-07-02 NOTE — Assessment & Plan Note (Signed)
She is tolerating chemotherapy poorly. She has progressive anemia with symptoms. We discussed the risk and benefit of discontinuation of chemotherapy. The patient wants to pursue any additional therapy. I will adjust the dose of chemotherapy further.

## 2013-07-02 NOTE — Patient Instructions (Signed)
[facility] Discharge Instructions for Patients Receiving Chemotherapy  Today you received the following chemotherapy agents: Navelbine and Cisplatin   To help prevent nausea and vomiting after your treatment, we encourage you to take your nausea medication as prescribed.   If you develop nausea and vomiting that is not controlled by your nausea medication, call the clinic.   BELOW ARE SYMPTOMS THAT SHOULD BE REPORTED IMMEDIATELY:  *FEVER GREATER THAN 100.5 F  *CHILLS WITH OR WITHOUT FEVER  NAUSEA AND VOMITING THAT IS NOT CONTROLLED WITH YOUR NAUSEA MEDICATION  *UNUSUAL SHORTNESS OF BREATH  *UNUSUAL BRUISING OR BLEEDING  TENDERNESS IN MOUTH AND THROAT WITH OR WITHOUT PRESENCE OF ULCERS  *URINARY PROBLEMS  *BOWEL PROBLEMS  UNUSUAL RASH Items with * indicate a potential emergency and should be followed up as soon as possible.  Feel free to call the clinic you have any questions or concerns. The clinic phone number is [phone].

## 2013-07-02 NOTE — Progress Notes (Signed)
[facility] OFFICE PROGRESS NOTE  Patient Care Team: Glendale Chard, MD as PCP - General (Internal Medicine) Heath Lark, MD as Consulting Physician (Hematology and Oncology) [name], MD as Consulting Physician (Cardiothoracic Surgery)  SUMMARY OF ONCOLOGIC HISTORY: Oncology History   Non-small cell lung cancer, adenocarcinoma   Primary site: Lung (Left)   Staging method: AJCC 7th Edition   Clinical: Stage IB (T2a, N0, M0) signed by Heath Lark, MD on 04/29/2013  9:01 PM   Pathologic: Stage IB (T2a, N0, cM0) signed by [name], MD on 04/09/2013 12:51 PM   Summary: Stage IB (T2a, N0, cM0)       Non-small cell lung cancer   02/26/2013 Imaging CXR for evaluation of cough showed new left lung mass   02/28/2013 Imaging CT chest showed left new lung nodule   03/14/2013 Imaging PET/CT scan showed localized disease in the left lung   04/07/2013 Surgery She underwent bronchoscopy and left lower lobe resection and lymph node sampling with negative margins   05/06/2013 Procedure The patient has placement of Infuse-a-Port   05/21/2013 -  Chemotherapy She is started on adjuvant chemotherapy with navelbine and cisplatin   07/01/2013 Imaging CT scan of the chest shows stable appearance.   07/02/2013 Adverse Reaction Dose of chemotherapy was adjusted further due to severe anemia.    INTERVAL HISTORY: Please see below for problem oriented charting. She is seen prior to cycle 3 of therapy. She complains of anorexia and fatigue. She has shortness of breath on minimal exertion. The incision pain is well controlled with current pain medicine.  REVIEW OF SYSTEMS:   Constitutional: Denies fevers, chills or abnormal weight loss Eyes: Denies blurriness of vision Ears, nose, mouth, throat, and face: Denies mucositis or sore throat Gastrointestinal:  Denies nausea, heartburn or change in bowel habits Skin: Denies abnormal skin rashes Lymphatics: Denies new lymphadenopathy or easy  bruising Neurological:Denies numbness, tingling  Behavioral/Psych: Mood is stable, no new changes  All other systems were reviewed with the patient and are negative.  I have reviewed the past medical history, past surgical history, social history and family history with the patient and they are unchanged from previous note.  ALLERGIES:  is allergic to aspirin; lactose intolerance (gi); lisinopril; and adhesive.  MEDICATIONS:  Current Outpatient Prescriptions  Medication Sig Dispense Refill  . Alum & Mag Hydroxide-Simeth (MAGIC MOUTHWASH W/LIDOCAINE) SOLN Take 5 mLs by mouth 4 (four) times daily.  240 mL  0  . Cholecalciferol (VITAMIN D-3) 1000 UNITS CAPS Take 1 capsule by mouth daily.      . clopidogrel (PLAVIX) 75 MG tablet Take 75 mg by mouth daily with breakfast.       . lidocaine-prilocaine (EMLA) cream Apply 1 application topically as needed.  30 g  0  . magnesium oxide (MAG-OX) 400 MG tablet Take 400 mg by mouth daily.      . metoprolol succinate (TOPROL-XL) 25 MG 24 hr tablet Take 25 mg by mouth daily.       . Multiple Vitamin (MULTIVITAMIN) tablet Take 1 tablet by mouth daily.      . ondansetron (ZOFRAN) 8 MG tablet Take 1 tablet (8 mg total) by mouth 2 (two) times daily as needed. Start on the third day after chemotherapy.  30 tablet  1  . oxyCODONE-acetaminophen (PERCOCET/ROXICET) 5-325 MG per tablet Take 1-2 tablets by mouth every 8 (eight) hours as needed for severe pain.  30 tablet  0  . prochlorperazine (COMPAZINE) 10 MG tablet  Take 1 tablet (10 mg total) by mouth every 6 (six) hours as needed (Nausea or vomiting).  30 tablet  1  . PROCTOZONE-HC 2.5 % rectal cream       . sennosides-docusate sodium (SENOKOT-S) 8.6-50 MG tablet Take 1 tablet by mouth daily.      Marland Kitchen tobramycin (TOBREX) 0.3 % ophthalmic solution PLACE 1 DROP INTO THE RIGHT EYE EVERY 4 HOURS  5 mL  0  . travoprost, benzalkonium, (TRAVATAN) 0.004 % ophthalmic solution Place 1 drop into both eyes at bedtime.      .  valsartan (DIOVAN) 80 MG tablet Take 1 tablet (80 mg total) by mouth daily.       No current facility-administered medications for this visit.    PHYSICAL EXAMINATION: ECOG PERFORMANCE STATUS: 2 - Symptomatic, <50% confined to bed  Filed Vitals:   07/02/13 0905  BP: 144/58  Pulse: 62  Temp: 97.7 F (36.5 C)  Resp: 20   Filed Weights   07/02/13 0905  Weight: 158 lb 8 oz (71.895 kg)    GENERAL:alert, no distress and comfortable SKIN: skin color, texture, turgor are normal, no rashes or significant lesions EYES: normal, Conjunctiva are pale and non-injected, sclera clear OROPHARYNX:no exudate, no erythema and lips, buccal mucosa, and tongue normal  NECK: supple, thyroid normal size, non-tender, without nodularity LYMPH:  no palpable lymphadenopathy in the cervical, axillary or inguinal LUNGS: clear to auscultation and percussion with normal breathing effort HEART: regular rate & rhythm and no murmurs and no lower extremity edema ABDOMEN:abdomen soft, non-tender and normal bowel sounds Musculoskeletal:no cyanosis of digits and no clubbing  NEURO: alert & oriented x 3 with fluent speech, no focal motor/sensory deficits  LABORATORY DATA:  I have reviewed the data as listed    Component Value Date/Time   NA 143 07/02/2013 0844   NA 141 05/06/2013 0619   K 4.1 07/02/2013 0844   K 4.0 05/06/2013 0619   CL 106 05/06/2013 0619   CO2 25 07/02/2013 0844   CO2 20 05/06/2013 0619   GLUCOSE 95 07/02/2013 0844   GLUCOSE 97 05/06/2013 0619   BUN 9.3 07/02/2013 0844   BUN 14 05/06/2013 0619   CREATININE 1.2* 07/02/2013 0844   CREATININE 1.02 05/06/2013 0619   CALCIUM 8.8 07/02/2013 0844   CALCIUM 9.5 05/06/2013 0619   PROT 5.8* 07/02/2013 0844   PROT 6.5 05/06/2013 0619   ALBUMIN 3.4* 07/02/2013 0844   ALBUMIN 3.5 05/06/2013 0619   AST 22 07/02/2013 0844   AST 24 05/06/2013 0619   ALT 15 07/02/2013 0844   ALT 17 05/06/2013 0619   ALKPHOS 94 07/02/2013 0844   ALKPHOS 70 05/06/2013 0619   BILITOT  0.23 07/02/2013 0844   BILITOT <0.2* 05/06/2013 0619   GFRNONAA 52* 05/06/2013 0619   GFRAA 61* 05/06/2013 0619    No results found for this basename: SPEP, UPEP,  kappa and lambda light chains    Lab Results  Component Value Date   WBC 11.4* 07/02/2013   NEUTROABS 8.3* 07/02/2013   HGB 8.0* 07/02/2013   HCT 25.1* 07/02/2013   MCV 93.9 07/02/2013   PLT 183 07/02/2013      Chemistry      Component Value Date/Time   NA 143 07/02/2013 0844   NA 141 05/06/2013 0619   K 4.1 07/02/2013 0844   K 4.0 05/06/2013 0619   CL 106 05/06/2013 0619   CO2 25 07/02/2013 0844   CO2 20 05/06/2013 0619   BUN 9.3 07/02/2013 0844  BUN 14 05/06/2013 0619   CREATININE 1.2* 07/02/2013 0844   CREATININE 1.02 05/06/2013 0619      Component Value Date/Time   CALCIUM 8.8 07/02/2013 0844   CALCIUM 9.5 05/06/2013 0619   ALKPHOS 94 07/02/2013 0844   ALKPHOS 70 05/06/2013 0619   AST 22 07/02/2013 0844   AST 24 05/06/2013 0619   ALT 15 07/02/2013 0844   ALT 17 05/06/2013 0619   BILITOT 0.23 07/02/2013 0844   BILITOT <0.2* 05/06/2013 1856       RADIOGRAPHIC STUDIES: I have personally reviewed the radiological images as listed and agreed with the findings in the report. Ct Chest W Contrast  07/01/2013   CLINICAL DATA:  Non-small-cell lung cancer. Restaging. Weight loss and shortness of breath. Rule out recurrence.  EXAM: CT CHEST WITH CONTRAST  TECHNIQUE: Multidetector CT imaging of the chest was performed during intravenous contrast administration.  CONTRAST:  34mL OMNIPAQUE IOHEXOL 300 MG/ML  SOLN  COMPARISON:  Clinic note of 06/11/2013. Chest radiograph 05/05/2013. Most recent chest CT 02/28/2013. PET of 03/14/2013.  FINDINGS: Lungs/Pleura: Status post left lower lobectomy. Moderate centrilobular emphysema.  Mild volume loss in the remaining left lung base. Minimal increased density on image 34 is favored to be due to subsegmental atelectasis and scarring. This is contiguous with more typical linear atelectasis laterally.  Trace  left-sided pleural fluid and thickening. Minimal loculation medially on image 37.  Heart/Mediastinum: No supraclavicular adenopathy. Left-sided thyroid mass measures 3.0 cm, similar. This deviates the trachea to the right mildly. Extensive aortic atherosclerosis. Normal heart size with coronary artery atherosclerosis. No central pulmonary embolism, on this non-dedicated study. A a left-sided Port-A-Cath terminates at the low SVC. No mediastinal or hilar adenopathy.  Upper Abdomen: Hepatic steatosis. Scattered subcentimeter hepatic cysts, unchanged. Vague hyper enhancement in the lateral segment left lobe on image 51. 8 mm. Present on image 50 of the prior exam.  Normal imaged portions of the spleen, stomach, pancreas, gallbladder, adrenal glands. Abdominal aortic and branch vessel atherosclerosis.  Bones/Musculoskeletal: Lower cervical spine fixation. No acute osseous abnormality.  IMPRESSION: 1. Status post left lower lobectomy, without residual/recurrent or metastatic disease. 2. Trace left-sided pleural fluid with minimal loculation. Volume loss in the left hemi thorax. An area of probable scarring or atelectasis at the remaining left lung base warrants followup attention. 3. Similar appearance of left hepatic lobe 8 mm focus of hyperenhancement. Favored to represent a hemangioma or perfusion anomaly. Given relative low clinical risk of metastatic disease, and absence of hypermetabolism on intervening PET, an appropriate clinical strategy would include attention on followup CT. A more aggressive approach of pre and post contrast abdominal MRI could alternatively be performed.   Electronically Signed   By: Abigail Miyamoto M.D.   On: 07/01/2013 17:17     ASSESSMENT & PLAN:  Non-small cell lung cancer She is tolerating chemotherapy poorly. She has progressive anemia with symptoms. We discussed the risk and benefit of discontinuation of chemotherapy. The patient wants to pursue any additional therapy. I will  adjust the dose of chemotherapy further.  Anemia in neoplastic disease We discussed some of the risks, benefits, and alternatives of blood transfusions. The patient is symptomatic from anemia and the hemoglobin level is critically low.  Some of the side-effects to be expected including risks of transfusion reactions, chills, infection, syndrome of volume overload and risk of hospitalization from various reasons and the patient is willing to proceed and went ahead to sign consent today.   Incisional pain The patient appears  to be well controlled on current pain regimen.   Orders Placed This Encounter  Procedures  . Hold Tube, Blood Bank    Standing Status: Standing     Number of Occurrences: 9     Standing Expiration Date: 07/03/2014   All questions were answered. The patient knows to call the clinic with any problems, questions or concerns. No barriers to learning was detected.   [facility], Satcha Storlie, MD 07/02/2013 9:14 PM

## 2013-07-02 NOTE — Assessment & Plan Note (Signed)
We discussed some of the risks, benefits, and alternatives of blood transfusions. The patient is symptomatic from anemia and the hemoglobin level is critically low.  Some of the side-effects to be expected including risks of transfusion reactions, chills, infection, syndrome of volume overload and risk of hospitalization from various reasons and the patient is willing to proceed and went ahead to sign consent today.

## 2013-07-02 NOTE — Assessment & Plan Note (Signed)
The patient appears to be well controlled on current pain regimen.

## 2013-07-02 NOTE — Progress Notes (Signed)
Follow up completed with pt is chemo room.  Wt 158.5 lbs down only ~2 lbs in <30 days.  Pt states she is eating a regular diet and is out of Boost, but plans on starting back.  Pt c/o of nausea, but better today.    Nutrition Dx:  Inadequate oral intake continues.    Intervention:  Encouraged consumption of Boost, coupons provided.  Recommend small frequent meals for help with nausea and to help with wt maintenance.    Monitoring, evaluation, goals:  Pt will work to increase oral intake and consume 1-2 Boost/day to minimize wt loss.    Next visit to be scheduled.

## 2013-07-02 NOTE — Patient Instructions (Signed)

## 2013-07-02 NOTE — Progress Notes (Signed)
1200: Pt voided x1 unmeasured amount of urine; will try again at end of 2 hour prehydration.  1245: pt voided 100 cc urine.  Dr. Alvy Bimler notified patient has voided x2 but only measurable amount is 100 cc. Per MD, OK to proceed with chemotherapy.

## 2013-07-02 NOTE — Progress Notes (Signed)
Ok to treat w/ Hgb 8.0 today per Dr. Alvy Bimler.  Plan for blood transfusion tomorrow as ordered.

## 2013-07-03 ENCOUNTER — Ambulatory Visit: Payer: Medicare Other

## 2013-07-03 VITALS — BP 156/59 | HR 60 | Temp 98.1°F | Resp 18

## 2013-07-03 DIAGNOSIS — D630 Anemia in neoplastic disease: Secondary | ICD-10-CM | POA: Diagnosis not present

## 2013-07-03 LAB — PREPARE RBC (CROSSMATCH)

## 2013-07-03 MED ORDER — SODIUM CHLORIDE 0.9 % IJ SOLN
10.0000 mL | INTRAMUSCULAR | Status: AC | PRN
  Administered 2013-07-03: 10 mL
  Filled 2013-07-03: qty 10

## 2013-07-03 MED ORDER — SODIUM CHLORIDE 0.9 % IV SOLN: 250.0000 mL | Freq: Once | INTRAVENOUS | Status: DC

## 2013-07-03 MED ORDER — HEPARIN SOD (PORK) LOCK FLUSH 100 UNIT/ML IV SOLN
500.0000 [IU] | Freq: Every day | INTRAVENOUS | Status: AC | PRN
  Administered 2013-07-03: 500 [IU]
  Filled 2013-07-03: qty 5

## 2013-07-03 MED ORDER — HEPARIN SOD (PORK) LOCK FLUSH 100 UNIT/ML IV SOLN
250.0000 [IU] | INTRAVENOUS | Status: DC | PRN
  Filled 2013-07-03: qty 5

## 2013-07-03 NOTE — Patient Instructions (Signed)
Blood Transfusion Information WHAT IS A BLOOD TRANSFUSION? A transfusion is the replacement of blood or some of its parts. Blood is made up of multiple cells which provide different functions.  Red blood cells carry oxygen and are used for blood loss replacement.  White blood cells fight against infection.  Platelets control bleeding.  Plasma helps clot blood.  Other blood products are available for specialized needs, such as hemophilia or other clotting disorders. BEFORE THE TRANSFUSION  Who gives blood for transfusions?   You may be able to donate blood to be used at a later date on yourself (autologous donation).  Relatives can be asked to donate blood. This is generally not any safer than if you have received blood from a stranger. The same precautions are taken to ensure safety when a relative's blood is donated.  Healthy volunteers who are fully evaluated to make sure their blood is safe. This is blood bank blood. Transfusion therapy is the safest it has ever been in the practice of medicine. Before blood is taken from a donor, a complete history is taken to make sure that person has no history of diseases nor engages in risky social behavior (examples are intravenous drug use or sexual activity with multiple partners). The donor's travel history is screened to minimize risk of transmitting infections, such as malaria. The donated blood is tested for signs of infectious diseases, such as HIV and hepatitis. The blood is then tested to be sure it is compatible with you in order to minimize the chance of a transfusion reaction. If you or a relative donates blood, this is often done in anticipation of surgery and is not appropriate for emergency situations. It takes many days to process the donated blood. RISKS AND COMPLICATIONS Although transfusion therapy is very safe and saves many lives, the main dangers of transfusion include:   Getting an infectious disease.  Developing a  transfusion reaction. This is an allergic reaction to something in the blood you were given. Every precaution is taken to prevent this. The decision to have a blood transfusion has been considered carefully by your caregiver before blood is given. Blood is not given unless the benefits outweigh the risks. AFTER THE TRANSFUSION  Right after receiving a blood transfusion, you will usually feel much better and more energetic. This is especially true if your red blood cells have gotten low (anemic). The transfusion raises the level of the red blood cells which carry oxygen, and this usually causes an energy increase.  The nurse administering the transfusion will monitor you carefully for complications. HOME CARE INSTRUCTIONS  No special instructions are needed after a transfusion. You may find your energy is better. Speak with your caregiver about any limitations on activity for underlying diseases you may have. SEEK MEDICAL CARE IF:   Your condition is not improving after your transfusion.  You develop redness or irritation at the intravenous (IV) site. SEEK IMMEDIATE MEDICAL CARE IF:  Any of the following symptoms occur over the next 12 hours:  Shaking chills.  You have a temperature by mouth above 102 F (38.9 C), not controlled by medicine.  Chest, back, or muscle pain.  People around you feel you are not acting correctly or are confused.  Shortness of breath or difficulty breathing.  Dizziness and fainting.  You get a rash or develop hives.  You have a decrease in urine output.  Your urine turns a dark color or changes to pink, red, or brown. Any of the following   symptoms occur over the next 10 days:  You have a temperature by mouth above 102 F (38.9 C), not controlled by medicine.  Shortness of breath.  Weakness after normal activity.  The white part of the eye turns yellow (jaundice).  You have a decrease in the amount of urine or are urinating less often.  Your  urine turns a dark color or changes to pink, red, or brown. Document Released: 01/07/2000 Document Revised: 04/03/2011 Document Reviewed: 08/26/2007 Exit[employer] Patient Information 2014 [employer].  

## 2013-07-04 LAB — TYPE AND SCREEN
ABO/RH(D): B POS
Antibody Screen: NEGATIVE
UNIT DIVISION: 0
UNIT DIVISION: 0
Unit division: 0
Unit division: 0

## 2013-07-07 ENCOUNTER — Telehealth: Payer: Self-pay | Admitting: *Deleted

## 2013-07-07 NOTE — Telephone Encounter (Signed)
Informed daughter pt can resume iron 325 mg daily 30 min before breakfast.  Daughter states how pt had a lot more energy in the past when she was taking the iron.  She says pt doesn't seem to have any more energy after her blood transfusion last week.  Discussed w/ daughter side effects of chemo along w/ pt's cancer and age all contribute to causing pt's fatigue.  Taking iron may help, it won't hurt.  She verbalized understanding.

## 2013-07-07 NOTE — Telephone Encounter (Signed)
Daughter states pt felt better when she was taking her iron pills. Is it ok for her to start taking her iron again?

## 2013-07-07 NOTE — Telephone Encounter (Signed)
Yes, 30 mins before meals

## 2013-07-09 ENCOUNTER — Other Ambulatory Visit: Payer: Self-pay | Admitting: Hematology and Oncology

## 2013-07-09 ENCOUNTER — Telehealth: Payer: Self-pay | Admitting: *Deleted

## 2013-07-09 ENCOUNTER — Ambulatory Visit (HOSPITAL_BASED_OUTPATIENT_CLINIC_OR_DEPARTMENT_OTHER): Payer: Medicare Other

## 2013-07-09 ENCOUNTER — Ambulatory Visit: Payer: Medicare Other

## 2013-07-09 ENCOUNTER — Other Ambulatory Visit: Payer: Self-pay | Admitting: *Deleted

## 2013-07-09 ENCOUNTER — Other Ambulatory Visit (HOSPITAL_BASED_OUTPATIENT_CLINIC_OR_DEPARTMENT_OTHER): Payer: Medicare Other

## 2013-07-09 VITALS — BP 151/72 | HR 64 | Temp 96.0°F | Resp 20

## 2013-07-09 DIAGNOSIS — C3410 Malignant neoplasm of upper lobe, unspecified bronchus or lung: Secondary | ICD-10-CM | POA: Diagnosis not present

## 2013-07-09 DIAGNOSIS — C3430 Malignant neoplasm of lower lobe, unspecified bronchus or lung: Secondary | ICD-10-CM

## 2013-07-09 DIAGNOSIS — D630 Anemia in neoplastic disease: Secondary | ICD-10-CM

## 2013-07-09 DIAGNOSIS — C3490 Malignant neoplasm of unspecified part of unspecified bronchus or lung: Secondary | ICD-10-CM

## 2013-07-09 DIAGNOSIS — Z95828 Presence of other vascular implants and grafts: Secondary | ICD-10-CM

## 2013-07-09 DIAGNOSIS — Z5111 Encounter for antineoplastic chemotherapy: Secondary | ICD-10-CM

## 2013-07-09 LAB — CBC WITH DIFFERENTIAL/PLATELET
BASO%: 2.4 % — ABNORMAL HIGH (ref 0.0–2.0)
Basophils Absolute: 0.1 10*3/uL (ref 0.0–0.1)
EOS%: 0.1 % (ref 0.0–7.0)
Eosinophils Absolute: 0 10*3/uL (ref 0.0–0.5)
HCT: 35.4 % (ref 34.8–46.6)
HGB: 11.7 g/dL (ref 11.6–15.9)
LYMPH#: 0.5 10*3/uL — AB (ref 0.9–3.3)
LYMPH%: 22 % (ref 14.0–49.7)
MCH: 30.7 pg (ref 25.1–34.0)
MCHC: 33.2 g/dL (ref 31.5–36.0)
MCV: 92.6 fL (ref 79.5–101.0)
MONO#: 0.1 10*3/uL (ref 0.1–0.9)
MONO%: 3.6 % (ref 0.0–14.0)
NEUT#: 1.8 10*3/uL (ref 1.5–6.5)
NEUT%: 71.9 % (ref 38.4–76.8)
Platelets: 162 10*3/uL (ref 145–400)
RBC: 3.82 10*6/uL (ref 3.70–5.45)
RDW: 19.4 % — ABNORMAL HIGH (ref 11.2–14.5)
WBC: 2.4 10*3/uL — AB (ref 3.9–10.3)

## 2013-07-09 LAB — COMPREHENSIVE METABOLIC PANEL (CC13)
ALT: 11 U/L (ref 0–55)
ANION GAP: 9 meq/L (ref 3–11)
AST: 16 U/L (ref 5–34)
Albumin: 3.7 g/dL (ref 3.5–5.0)
Alkaline Phosphatase: 77 U/L (ref 40–150)
BUN: 20 mg/dL (ref 7.0–26.0)
CALCIUM: 9.5 mg/dL (ref 8.4–10.4)
CHLORIDE: 110 meq/L — AB (ref 98–109)
CO2: 22 mEq/L (ref 22–29)
Creatinine: 1.2 mg/dL — ABNORMAL HIGH (ref 0.6–1.1)
Glucose: 97 mg/dl (ref 70–140)
POTASSIUM: 4.4 meq/L (ref 3.5–5.1)
Sodium: 141 mEq/L (ref 136–145)
TOTAL PROTEIN: 6.3 g/dL — AB (ref 6.4–8.3)
Total Bilirubin: 0.42 mg/dL (ref 0.20–1.20)

## 2013-07-09 LAB — HOLD TUBE, BLOOD BANK

## 2013-07-09 MED ORDER — SODIUM CHLORIDE 0.9 % IJ SOLN
10.0000 mL | INTRAMUSCULAR | Status: DC | PRN
  Administered 2013-07-09: 10 mL
  Filled 2013-07-09: qty 10

## 2013-07-09 MED ORDER — VINORELBINE TARTRATE CHEMO INJECTION 50 MG/5ML
28.0000 mg/m2 | INTRAVENOUS | Status: DC
  Administered 2013-07-09: 50 mg via INTRAVENOUS
  Filled 2013-07-09: qty 5

## 2013-07-09 MED ORDER — SODIUM CHLORIDE 0.9 % IJ SOLN
10.0000 mL | INTRAMUSCULAR | Status: DC | PRN
  Administered 2013-07-09: 10 mL via INTRAVENOUS
  Filled 2013-07-09: qty 10

## 2013-07-09 MED ORDER — HEPARIN SOD (PORK) LOCK FLUSH 100 UNIT/ML IV SOLN
500.0000 [IU] | Freq: Once | INTRAVENOUS | Status: AC | PRN
  Administered 2013-07-09: 500 [IU]
  Filled 2013-07-09: qty 5

## 2013-07-09 MED ORDER — ONDANSETRON 8 MG/50ML IVPB (CHCC)
8.0000 mg | Freq: Once | INTRAVENOUS | Status: AC
  Administered 2013-07-09: 8 mg via INTRAVENOUS

## 2013-07-09 MED ORDER — ONDANSETRON 8 MG/NS 50 ML IVPB
INTRAVENOUS | Status: AC
  Filled 2013-07-09: qty 8

## 2013-07-09 MED ORDER — DEXAMETHASONE SODIUM PHOSPHATE 20 MG/5ML IJ SOLN
12.0000 mg | Freq: Once | INTRAMUSCULAR | Status: AC
  Administered 2013-07-09: 12 mg via INTRAVENOUS

## 2013-07-09 MED ORDER — DEXAMETHASONE SODIUM PHOSPHATE 20 MG/5ML IJ SOLN
INTRAMUSCULAR | Status: AC
  Filled 2013-07-09: qty 5

## 2013-07-09 MED ORDER — SODIUM CHLORIDE 0.9 % IV SOLN
Freq: Once | INTRAVENOUS | Status: AC
  Administered 2013-07-09: 10:00:00 via INTRAVENOUS

## 2013-07-09 NOTE — Patient Instructions (Signed)
[facility] Discharge Instructions for Patients Receiving Chemotherapy  Today you received the following chemotherapy agents:  Navelbine  To help prevent nausea and vomiting after your treatment, we encourage you to take your nausea medication as ordered per MD.   If you develop nausea and vomiting that is not controlled by your nausea medication, call the clinic.   BELOW ARE SYMPTOMS THAT SHOULD BE REPORTED IMMEDIATELY:  *FEVER GREATER THAN 100.5 F  *CHILLS WITH OR WITHOUT FEVER  NAUSEA AND VOMITING THAT IS NOT CONTROLLED WITH YOUR NAUSEA MEDICATION  *UNUSUAL SHORTNESS OF BREATH  *UNUSUAL BRUISING OR BLEEDING  TENDERNESS IN MOUTH AND THROAT WITH OR WITHOUT PRESENCE OF ULCERS  *URINARY PROBLEMS  *BOWEL PROBLEMS  UNUSUAL RASH Items with * indicate a potential emergency and should be followed up as soon as possible.  Feel free to call the clinic you have any questions or concerns. The clinic phone number is [phone].

## 2013-07-09 NOTE — Patient Instructions (Signed)

## 2013-07-09 NOTE — Progress Notes (Signed)
Ok to treat today per Dr. Alvy Bimler, no need to wait on CMET.

## 2013-07-09 NOTE — Telephone Encounter (Signed)
Daughter left VM asking for Rx for the iron pills for pt..   Called dau back and left VM informing her of iron 325 mg is OTC and no need for Rx.  Asked her to call back again if any questions.

## 2013-07-10 ENCOUNTER — Ambulatory Visit (HOSPITAL_BASED_OUTPATIENT_CLINIC_OR_DEPARTMENT_OTHER): Payer: Medicare Other

## 2013-07-10 VITALS — BP 150/64 | HR 64 | Temp 98.1°F

## 2013-07-10 DIAGNOSIS — C3410 Malignant neoplasm of upper lobe, unspecified bronchus or lung: Secondary | ICD-10-CM

## 2013-07-10 DIAGNOSIS — Z5189 Encounter for other specified aftercare: Secondary | ICD-10-CM | POA: Diagnosis not present

## 2013-07-10 DIAGNOSIS — D630 Anemia in neoplastic disease: Secondary | ICD-10-CM

## 2013-07-10 DIAGNOSIS — C3490 Malignant neoplasm of unspecified part of unspecified bronchus or lung: Secondary | ICD-10-CM

## 2013-07-10 MED ORDER — PEGFILGRASTIM INJECTION 6 MG/0.6ML
6.0000 mg | Freq: Once | SUBCUTANEOUS | Status: AC
  Administered 2013-07-10: 6 mg via SUBCUTANEOUS
  Filled 2013-07-10: qty 0.6

## 2013-07-23 ENCOUNTER — Ambulatory Visit: Payer: Medicare Other | Admitting: Nutrition

## 2013-07-23 ENCOUNTER — Ambulatory Visit (HOSPITAL_BASED_OUTPATIENT_CLINIC_OR_DEPARTMENT_OTHER): Payer: Medicare Other | Admitting: Hematology and Oncology

## 2013-07-23 ENCOUNTER — Telehealth: Payer: Self-pay | Admitting: *Deleted

## 2013-07-23 ENCOUNTER — Other Ambulatory Visit (HOSPITAL_BASED_OUTPATIENT_CLINIC_OR_DEPARTMENT_OTHER): Payer: Medicare Other

## 2013-07-23 ENCOUNTER — Ambulatory Visit: Payer: Medicare Other

## 2013-07-23 ENCOUNTER — Encounter: Payer: Self-pay | Admitting: Hematology and Oncology

## 2013-07-23 ENCOUNTER — Ambulatory Visit (HOSPITAL_BASED_OUTPATIENT_CLINIC_OR_DEPARTMENT_OTHER): Payer: Medicare Other

## 2013-07-23 VITALS — BP 136/61 | HR 67 | Temp 97.4°F | Resp 18 | Ht 60.0 in | Wt 153.1 lb

## 2013-07-23 DIAGNOSIS — D630 Anemia in neoplastic disease: Secondary | ICD-10-CM

## 2013-07-23 DIAGNOSIS — C3492 Malignant neoplasm of unspecified part of left bronchus or lung: Secondary | ICD-10-CM

## 2013-07-23 DIAGNOSIS — R209 Unspecified disturbances of skin sensation: Secondary | ICD-10-CM

## 2013-07-23 DIAGNOSIS — C3410 Malignant neoplasm of upper lobe, unspecified bronchus or lung: Secondary | ICD-10-CM

## 2013-07-23 DIAGNOSIS — G8918 Other acute postprocedural pain: Secondary | ICD-10-CM

## 2013-07-23 DIAGNOSIS — D72829 Elevated white blood cell count, unspecified: Secondary | ICD-10-CM | POA: Diagnosis not present

## 2013-07-23 DIAGNOSIS — C3490 Malignant neoplasm of unspecified part of unspecified bronchus or lung: Secondary | ICD-10-CM

## 2013-07-23 DIAGNOSIS — Z5111 Encounter for antineoplastic chemotherapy: Secondary | ICD-10-CM | POA: Diagnosis not present

## 2013-07-23 DIAGNOSIS — L7682 Other postprocedural complications of skin and subcutaneous tissue: Secondary | ICD-10-CM

## 2013-07-23 DIAGNOSIS — Z95828 Presence of other vascular implants and grafts: Secondary | ICD-10-CM

## 2013-07-23 LAB — CBC WITH DIFFERENTIAL/PLATELET
BASO%: 0.4 % (ref 0.0–2.0)
BASOS ABS: 0.1 10*3/uL (ref 0.0–0.1)
EOS%: 0 % (ref 0.0–7.0)
Eosinophils Absolute: 0 10*3/uL (ref 0.0–0.5)
HCT: 29.8 % — ABNORMAL LOW (ref 34.8–46.6)
HEMOGLOBIN: 9.5 g/dL — AB (ref 11.6–15.9)
LYMPH%: 8.2 % — ABNORMAL LOW (ref 14.0–49.7)
MCH: 29.8 pg (ref 25.1–34.0)
MCHC: 32 g/dL (ref 31.5–36.0)
MCV: 93 fL (ref 79.5–101.0)
MONO#: 2.8 10*3/uL — ABNORMAL HIGH (ref 0.1–0.9)
MONO%: 10.9 % (ref 0.0–14.0)
NEUT#: 20.9 10*3/uL — ABNORMAL HIGH (ref 1.5–6.5)
NEUT%: 80.5 % — ABNORMAL HIGH (ref 38.4–76.8)
Platelets: 202 10*3/uL (ref 145–400)
RBC: 3.21 10*6/uL — AB (ref 3.70–5.45)
RDW: 20.7 % — AB (ref 11.2–14.5)
WBC: 25.9 10*3/uL — ABNORMAL HIGH (ref 3.9–10.3)
lymph#: 2.1 10*3/uL (ref 0.9–3.3)

## 2013-07-23 LAB — COMPREHENSIVE METABOLIC PANEL (CC13)
ALBUMIN: 3.6 g/dL (ref 3.5–5.0)
ALK PHOS: 116 U/L (ref 40–150)
ALT: 13 U/L (ref 0–55)
AST: 21 U/L (ref 5–34)
Anion Gap: 9 mEq/L (ref 3–11)
BILIRUBIN TOTAL: 0.23 mg/dL (ref 0.20–1.20)
BUN: 11.4 mg/dL (ref 7.0–26.0)
CO2: 23 mEq/L (ref 22–29)
Calcium: 9.4 mg/dL (ref 8.4–10.4)
Chloride: 109 mEq/L (ref 98–109)
Creatinine: 1.2 mg/dL — ABNORMAL HIGH (ref 0.6–1.1)
Glucose: 97 mg/dl (ref 70–140)
POTASSIUM: 4.3 meq/L (ref 3.5–5.1)
Sodium: 141 mEq/L (ref 136–145)
Total Protein: 6.2 g/dL — ABNORMAL LOW (ref 6.4–8.3)

## 2013-07-23 LAB — HOLD TUBE, BLOOD BANK

## 2013-07-23 MED ORDER — SODIUM CHLORIDE 0.9 % IV SOLN
30.0000 mg/m2 | INTRAVENOUS | Status: DC
  Administered 2013-07-23: 50 mg via INTRAVENOUS
  Filled 2013-07-23: qty 5

## 2013-07-23 MED ORDER — SODIUM CHLORIDE 0.9 % IJ SOLN
10.0000 mL | INTRAMUSCULAR | Status: DC | PRN
  Administered 2013-07-23: 10 mL
  Filled 2013-07-23: qty 10

## 2013-07-23 MED ORDER — PALONOSETRON HCL INJECTION 0.25 MG/5ML
0.2500 mg | Freq: Once | INTRAVENOUS | Status: AC
  Administered 2013-07-23: 0.25 mg via INTRAVENOUS

## 2013-07-23 MED ORDER — SODIUM CHLORIDE 0.9 % IJ SOLN
10.0000 mL | INTRAMUSCULAR | Status: DC | PRN
  Administered 2013-07-23: 10 mL via INTRAVENOUS
  Filled 2013-07-23: qty 10

## 2013-07-23 MED ORDER — POTASSIUM CHLORIDE 2 MEQ/ML IV SOLN
Freq: Once | INTRAVENOUS | Status: AC
  Administered 2013-07-23: 10:00:00 via INTRAVENOUS
  Filled 2013-07-23: qty 10

## 2013-07-23 MED ORDER — OXYCODONE-ACETAMINOPHEN 5-325 MG PO TABS: 1.0000 | ORAL_TABLET | Freq: Four times a day (QID) | ORAL | Status: DC | PRN

## 2013-07-23 MED ORDER — FOSAPREPITANT DIMEGLUMINE INJECTION 150 MG
150.0000 mg | Freq: Once | INTRAVENOUS | Status: AC
  Administered 2013-07-23: 150 mg via INTRAVENOUS
  Filled 2013-07-23: qty 5

## 2013-07-23 MED ORDER — SODIUM CHLORIDE 0.9 % IV SOLN
Freq: Once | INTRAVENOUS | Status: AC
  Administered 2013-07-23: 10:00:00 via INTRAVENOUS

## 2013-07-23 MED ORDER — HEPARIN SOD (PORK) LOCK FLUSH 100 UNIT/ML IV SOLN
500.0000 [IU] | Freq: Once | INTRAVENOUS | Status: AC | PRN
  Administered 2013-07-23: 500 [IU]
  Filled 2013-07-23: qty 5

## 2013-07-23 MED ORDER — DEXAMETHASONE SODIUM PHOSPHATE 20 MG/5ML IJ SOLN
INTRAMUSCULAR | Status: AC
  Filled 2013-07-23: qty 5

## 2013-07-23 MED ORDER — DEXAMETHASONE SODIUM PHOSPHATE 20 MG/5ML IJ SOLN
12.0000 mg | Freq: Once | INTRAMUSCULAR | Status: AC
  Administered 2013-07-23: 12 mg via INTRAVENOUS

## 2013-07-23 MED ORDER — SODIUM CHLORIDE 0.9 % IV SOLN
37.5000 mg/m2 | Freq: Once | INTRAVENOUS | Status: AC
  Administered 2013-07-23: 67 mg via INTRAVENOUS
  Filled 2013-07-23: qty 67

## 2013-07-23 MED ORDER — PALONOSETRON HCL INJECTION 0.25 MG/5ML
INTRAVENOUS | Status: AC
  Filled 2013-07-23: qty 5

## 2013-07-23 NOTE — Assessment & Plan Note (Signed)
This is likely due to recent Neulasta injection. Clinically, she has no signs of infection.

## 2013-07-23 NOTE — Progress Notes (Signed)
[name] OFFICE PROGRESS NOTE  Patient Care Team: Glendale Chard, MD as PCP - General (Internal Medicine) Heath Lark, MD as Consulting Physician (Hematology and Oncology) [name], MD as Consulting Physician (Cardiothoracic Surgery)  SUMMARY OF ONCOLOGIC HISTORY: Oncology History   Non-small cell lung cancer, adenocarcinoma   Primary site: Lung (Left)   Staging method: AJCC 7th Edition   Clinical: Stage IB (T2a, N0, M0) signed by Heath Lark, MD on 04/29/2013  9:01 PM   Pathologic: Stage IB (T2a, N0, cM0) signed by [name], MD on 04/09/2013 12:51 PM   Summary: Stage IB (T2a, N0, cM0)       Non-small cell lung cancer   02/26/2013 Imaging CXR for evaluation of cough showed new left lung mass   02/28/2013 Imaging CT chest showed left new lung nodule   03/14/2013 Imaging PET/CT scan showed localized disease in the left lung   04/07/2013 Surgery She underwent bronchoscopy and left lower lobe resection and lymph node sampling with negative margins   05/06/2013 Procedure The patient has placement of Infuse-a-Port   05/21/2013 -  Chemotherapy She is started on adjuvant chemotherapy with navelbine and cisplatin   07/01/2013 Imaging CT scan of the chest shows stable appearance.   07/02/2013 Adverse Reaction Dose of chemotherapy was adjusted further due to severe anemia.    INTERVAL HISTORY: Please see below for problem oriented charting. She is seen prior to cycle 4 treatment. She tolerated treatment well apart from fatigue and bone pain related to Neulasta. She still had persistent rib pain at the incision site. She average about 4 pain medicine a day. She has some mild constipation, resolved with laxatives.  REVIEW OF SYSTEMS:   Constitutional: Denies fevers, chills or abnormal weight loss Eyes: Denies blurriness of vision Ears, nose, mouth, throat, and face: Denies mucositis or sore throat Respiratory: Denies cough, dyspnea or wheezes Cardiovascular: Denies  palpitation, chest discomfort or lower extremity swelling Skin: Denies abnormal skin rashes Lymphatics: Denies new lymphadenopathy or easy bruising Neurological:Denies numbness, tingling or new weaknesses Behavioral/Psych: Mood is stable, no new changes  All other systems were reviewed with the patient and are negative.  I have reviewed the past medical history, past surgical history, social history and family history with the patient and they are unchanged from previous note.  ALLERGIES:  is allergic to aspirin; lactose intolerance (gi); lisinopril; and adhesive.  MEDICATIONS:  Current Outpatient Prescriptions  Medication Sig Dispense Refill  . Alum & Mag Hydroxide-Simeth (MAGIC MOUTHWASH W/LIDOCAINE) SOLN Take 5 mLs by mouth 4 (four) times daily.  240 mL  0  . Cholecalciferol (VITAMIN D-3) 1000 UNITS CAPS Take 1 capsule by mouth daily.      . clopidogrel (PLAVIX) 75 MG tablet Take 75 mg by mouth daily with breakfast.       . lidocaine-prilocaine (EMLA) cream Apply 1 application topically as needed.  30 g  0  . magnesium oxide (MAG-OX) 400 MG tablet Take 400 mg by mouth daily.      . metoprolol succinate (TOPROL-XL) 25 MG 24 hr tablet Take 25 mg by mouth daily.       . Multiple Vitamin (MULTIVITAMIN) tablet Take 1 tablet by mouth daily.      . ondansetron (ZOFRAN) 8 MG tablet Take 1 tablet (8 mg total) by mouth 2 (two) times daily as needed. Start on the third day after chemotherapy.  30 tablet  1  . oxyCODONE-acetaminophen (PERCOCET/ROXICET) 5-325 MG per tablet Take 1 tablet by mouth  every 6 (six) hours as needed for severe pain.  90 tablet  0  . prochlorperazine (COMPAZINE) 10 MG tablet TAKE 1 TABLET BY MOUTH EVERY 6 HOURS AS NEEDED FOR NAUSEA OR VOMITING  30 tablet  0  . PROCTOZONE-HC 2.5 % rectal cream       . sennosides-docusate sodium (SENOKOT-S) 8.6-50 MG tablet Take 1 tablet by mouth daily.      Marland Kitchen tobramycin (TOBREX) 0.3 % ophthalmic solution PLACE 1 DROP INTO THE RIGHT EYE EVERY 4  HOURS  5 mL  0  . travoprost, benzalkonium, (TRAVATAN) 0.004 % ophthalmic solution Place 1 drop into both eyes at bedtime.      . valsartan (DIOVAN) 80 MG tablet Take 1 tablet (80 mg total) by mouth daily.       No current facility-administered medications for this visit.   Facility-Administered Medications Ordered in Other Visits  Medication Dose Route Frequency Provider Last Rate Last Dose  . CISplatin (PLATINOL) 67 mg in sodium chloride 0.9 % 250 mL chemo infusion  37.5 mg/m2 (Treatment Plan Actual) Intravenous Once Heath Lark, MD 317 mL/hr at 07/23/13 1226 67 mg at 07/23/13 1226  . heparin lock flush 100 unit/mL  500 Units Intracatheter Once PRN Heath Lark, MD      . sodium chloride 0.9 % injection 10 mL  10 mL Intracatheter PRN Heath Lark, MD      . vinorelbine (NAVELBINE) 50 mg in sodium chloride 0.9 % 50 mL chemo infusion  30 mg/m2 (Order-Specific) Intravenous Weekly Heath Lark, MD 330 mL/hr at 07/23/13 1210 50 mg at 07/23/13 1210    PHYSICAL EXAMINATION: ECOG PERFORMANCE STATUS: 1 - Symptomatic but completely ambulatory  Filed Vitals:   07/23/13 0912  BP: 136/61  Pulse: 67  Temp: 97.4 F (36.3 C)  Resp: 18   Filed Weights   07/23/13 0912  Weight: 153 lb 1.6 oz (69.446 kg)    GENERAL:alert, no distress and comfortable SKIN: skin color, texture, turgor are normal, no rashes or significant lesions EYES: normal, Conjunctiva are pink and non-injected, sclera clear OROPHARYNX:no exudate, no erythema and lips, buccal mucosa, and tongue normal  NECK: supple, thyroid normal size, non-tender, without nodularity LYMPH:  no palpable lymphadenopathy in the cervical, axillary or inguinal LUNGS: clear to auscultation and percussion with normal breathing effort HEART: regular rate & rhythm and no murmurs and no lower extremity edema ABDOMEN:abdomen soft, non-tender and normal bowel sounds Musculoskeletal:no cyanosis of digits and no clubbing  NEURO: alert & oriented x 3 with fluent  speech, no focal motor/sensory deficits  LABORATORY DATA:  I have reviewed the data as listed    Component Value Date/Time   NA 141 07/23/2013 0847   NA 141 05/06/2013 0619   K 4.3 07/23/2013 0847   K 4.0 05/06/2013 0619   CL 106 05/06/2013 0619   CO2 23 07/23/2013 0847   CO2 20 05/06/2013 0619   GLUCOSE 97 07/23/2013 0847   GLUCOSE 97 05/06/2013 0619   BUN 11.4 07/23/2013 0847   BUN 14 05/06/2013 0619   CREATININE 1.2* 07/23/2013 0847   CREATININE 1.02 05/06/2013 0619   CALCIUM 9.4 07/23/2013 0847   CALCIUM 9.5 05/06/2013 0619   PROT 6.2* 07/23/2013 0847   PROT 6.5 05/06/2013 0619   ALBUMIN 3.6 07/23/2013 0847   ALBUMIN 3.5 05/06/2013 0619   AST 21 07/23/2013 0847   AST 24 05/06/2013 0619   ALT 13 07/23/2013 0847   ALT 17 05/06/2013 0619   ALKPHOS 116 07/23/2013 0847  ALKPHOS 70 05/06/2013 0619   BILITOT 0.23 07/23/2013 0847   BILITOT <0.2* 05/06/2013 0619   GFRNONAA 52* 05/06/2013 0619   GFRAA 61* 05/06/2013 0619    No results found for this basename: SPEP, UPEP,  kappa and lambda light chains    Lab Results  Component Value Date   WBC 25.9* 07/23/2013   NEUTROABS 20.9* 07/23/2013   HGB 9.5* 07/23/2013   HCT 29.8* 07/23/2013   MCV 93.0 07/23/2013   PLT 202 07/23/2013      Chemistry      Component Value Date/Time   NA 141 07/23/2013 0847   NA 141 05/06/2013 0619   K 4.3 07/23/2013 0847   K 4.0 05/06/2013 0619   CL 106 05/06/2013 0619   CO2 23 07/23/2013 0847   CO2 20 05/06/2013 0619   BUN 11.4 07/23/2013 0847   BUN 14 05/06/2013 0619   CREATININE 1.2* 07/23/2013 0847   CREATININE 1.02 05/06/2013 0619      Component Value Date/Time   CALCIUM 9.4 07/23/2013 0847   CALCIUM 9.5 05/06/2013 0619   ALKPHOS 116 07/23/2013 0847   ALKPHOS 70 05/06/2013 0619   AST 21 07/23/2013 0847   AST 24 05/06/2013 0619   ALT 13 07/23/2013 0847   ALT 17 05/06/2013 0619   BILITOT 0.23 07/23/2013 0847   BILITOT <0.2* 05/06/2013 0619     ASSESSMENT & PLAN:  Non-small cell lung cancer She tolerated chemotherapy poorly with major side effects  requiring multiple dosage adjustment. I will proceed with cycle 4 of treatment without further dosage adjustment.   Anemia in neoplastic disease This is likely anemia of chronic disease. The patient denies recent history of bleeding such as epistaxis, hematuria or hematochezia. She is asymptomatic from the anemia. We will observe for now.  She does not require transfusion now.    Leukocytosis This is likely due to recent Neulasta injection. Clinically, she has no signs of infection.  Incisional pain I refilled her prescription pain medicine. I warn her about risk of side effects.   No orders of the defined types were placed in this encounter.   All questions were answered. The patient knows to call the clinic with any problems, questions or concerns. No barriers to learning was detected. I spent 40 minutes counseling the patient face to face. The total time spent in the appointment was 55 minutes and more than 50% was on counseling and review of test results     [facility], [place], MD 07/23/2013 1:01 PM

## 2013-07-23 NOTE — Assessment & Plan Note (Signed)
I refilled her prescription pain medicine. I warn her about risk of side effects.

## 2013-07-23 NOTE — Assessment & Plan Note (Signed)
This is likely anemia of chronic disease. The patient denies recent history of bleeding such as epistaxis, hematuria or hematochezia. She is asymptomatic from the anemia. We will observe for now.  She does not require transfusion now.   

## 2013-07-23 NOTE — Patient Instructions (Signed)

## 2013-07-23 NOTE — Telephone Encounter (Signed)
Per staff message and POF I have scheduled appts. Advised scheduler of appts. JMW  

## 2013-07-23 NOTE — Progress Notes (Signed)
Ok to treat today despite elevated WBC per Dr. Alvy Bimler.

## 2013-07-23 NOTE — Progress Notes (Signed)
Nutrition followup completed with patient in chemotherapy room.  Patient is being treated for non-small cell lung cancer.  Weight has decreased and was documented as 153.1 pounds July 1, which is decreased from 158.5 pounds June 10.  Patient reports she still has nausea despite taking nausea medication as prescribed.  She has been drinking 2 boost daily.  Nutrition diagnosis: Inadequate oral intake continues.  Intervention: Educated patient to continue small frequent meals and increase boost +3 times a day.  Recommended patient discuss nausea and current medications with physician.  Teach back method used.  Monitoring, evaluation, goals: Patient has been unable to increase oral intake and has had weight loss.  Next visit: Wednesday, July 29, during chemotherapy.

## 2013-07-23 NOTE — Patient Instructions (Signed)
[facility] Discharge Instructions for Patients Receiving Chemotherapy  Today you received the following chemotherapy agents: Navelbine, Cisplatin  To help prevent nausea and vomiting after your treatment, we encourage you to take your nausea medication as prescribed by your physician.    If you develop nausea and vomiting that is not controlled by your nausea medication, call the clinic.   BELOW ARE SYMPTOMS THAT SHOULD BE REPORTED IMMEDIATELY:  *FEVER GREATER THAN 100.5 F  *CHILLS WITH OR WITHOUT FEVER  NAUSEA AND VOMITING THAT IS NOT CONTROLLED WITH YOUR NAUSEA MEDICATION  *UNUSUAL SHORTNESS OF BREATH  *UNUSUAL BRUISING OR BLEEDING  TENDERNESS IN MOUTH AND THROAT WITH OR WITHOUT PRESENCE OF ULCERS  *URINARY PROBLEMS  *BOWEL PROBLEMS  UNUSUAL RASH Items with * indicate a potential emergency and should be followed up as soon as possible.  Feel free to call the clinic you have any questions or concerns. The clinic phone number is [phone].

## 2013-07-23 NOTE — Assessment & Plan Note (Signed)
She tolerated chemotherapy poorly with major side effects requiring multiple dosage adjustment. I will proceed with cycle 4 of treatment without further dosage adjustment.

## 2013-07-28 ENCOUNTER — Telehealth: Payer: Self-pay | Admitting: Hematology and Oncology

## 2013-07-28 NOTE — Telephone Encounter (Signed)
Talked to pt and gave her appt for 07/30/12

## 2013-07-29 ENCOUNTER — Ambulatory Visit: Payer: Medicare Other | Admitting: Hematology and Oncology

## 2013-07-29 ENCOUNTER — Other Ambulatory Visit: Payer: Medicare Other

## 2013-07-30 ENCOUNTER — Ambulatory Visit (HOSPITAL_BASED_OUTPATIENT_CLINIC_OR_DEPARTMENT_OTHER): Payer: Medicare Other

## 2013-07-30 ENCOUNTER — Other Ambulatory Visit (HOSPITAL_BASED_OUTPATIENT_CLINIC_OR_DEPARTMENT_OTHER): Payer: Medicare Other

## 2013-07-30 ENCOUNTER — Ambulatory Visit: Payer: Medicare Other

## 2013-07-30 VITALS — BP 117/74 | HR 66 | Temp 97.0°F | Resp 20

## 2013-07-30 DIAGNOSIS — C3410 Malignant neoplasm of upper lobe, unspecified bronchus or lung: Secondary | ICD-10-CM

## 2013-07-30 DIAGNOSIS — Z5111 Encounter for antineoplastic chemotherapy: Secondary | ICD-10-CM

## 2013-07-30 DIAGNOSIS — C3490 Malignant neoplasm of unspecified part of unspecified bronchus or lung: Secondary | ICD-10-CM

## 2013-07-30 DIAGNOSIS — C3492 Malignant neoplasm of unspecified part of left bronchus or lung: Secondary | ICD-10-CM

## 2013-07-30 DIAGNOSIS — Z95828 Presence of other vascular implants and grafts: Secondary | ICD-10-CM

## 2013-07-30 DIAGNOSIS — D630 Anemia in neoplastic disease: Secondary | ICD-10-CM

## 2013-07-30 LAB — CBC WITH DIFFERENTIAL/PLATELET
BASO%: 1.4 % (ref 0.0–2.0)
Basophils Absolute: 0 10*3/uL (ref 0.0–0.1)
EOS ABS: 0 10*3/uL (ref 0.0–0.5)
EOS%: 0.1 % (ref 0.0–7.0)
HEMATOCRIT: 27.1 % — AB (ref 34.8–46.6)
HGB: 9 g/dL — ABNORMAL LOW (ref 11.6–15.9)
LYMPH%: 15.6 % (ref 14.0–49.7)
MCH: 30.4 pg (ref 25.1–34.0)
MCHC: 33.1 g/dL (ref 31.5–36.0)
MCV: 92 fL (ref 79.5–101.0)
MONO#: 0.1 10*3/uL (ref 0.1–0.9)
MONO%: 4.5 % (ref 0.0–14.0)
NEUT%: 78.4 % — AB (ref 38.4–76.8)
NEUTROS ABS: 2.4 10*3/uL (ref 1.5–6.5)
Platelets: 141 10*3/uL — ABNORMAL LOW (ref 145–400)
RBC: 2.94 10*6/uL — ABNORMAL LOW (ref 3.70–5.45)
RDW: 19.6 % — ABNORMAL HIGH (ref 11.2–14.5)
WBC: 3.1 10*3/uL — AB (ref 3.9–10.3)
lymph#: 0.5 10*3/uL — ABNORMAL LOW (ref 0.9–3.3)

## 2013-07-30 LAB — COMPREHENSIVE METABOLIC PANEL (CC13)
ALT: 15 U/L (ref 0–55)
ANION GAP: 8 meq/L (ref 3–11)
AST: 15 U/L (ref 5–34)
Albumin: 3.5 g/dL (ref 3.5–5.0)
Alkaline Phosphatase: 75 U/L (ref 40–150)
BILIRUBIN TOTAL: 0.27 mg/dL (ref 0.20–1.20)
BUN: 22.3 mg/dL (ref 7.0–26.0)
CALCIUM: 9.4 mg/dL (ref 8.4–10.4)
CHLORIDE: 109 meq/L (ref 98–109)
CO2: 22 meq/L (ref 22–29)
CREATININE: 1.4 mg/dL — AB (ref 0.6–1.1)
GLUCOSE: 104 mg/dL (ref 70–140)
Potassium: 4.7 mEq/L (ref 3.5–5.1)
Sodium: 139 mEq/L (ref 136–145)
Total Protein: 6.2 g/dL — ABNORMAL LOW (ref 6.4–8.3)

## 2013-07-30 LAB — HOLD TUBE, BLOOD BANK

## 2013-07-30 MED ORDER — SODIUM CHLORIDE 0.9 % IJ SOLN
10.0000 mL | INTRAMUSCULAR | Status: DC | PRN
  Administered 2013-07-30: 10 mL
  Filled 2013-07-30: qty 10

## 2013-07-30 MED ORDER — SODIUM CHLORIDE 0.9 % IJ SOLN
10.0000 mL | INTRAMUSCULAR | Status: DC | PRN
  Administered 2013-07-30: 10 mL via INTRAVENOUS
  Filled 2013-07-30: qty 10

## 2013-07-30 MED ORDER — ONDANSETRON 8 MG/NS 50 ML IVPB
INTRAVENOUS | Status: AC
  Filled 2013-07-30: qty 8

## 2013-07-30 MED ORDER — HEPARIN SOD (PORK) LOCK FLUSH 100 UNIT/ML IV SOLN
500.0000 [IU] | Freq: Once | INTRAVENOUS | Status: AC | PRN
  Administered 2013-07-30: 500 [IU]
  Filled 2013-07-30: qty 5

## 2013-07-30 MED ORDER — SODIUM CHLORIDE 0.9 % IV SOLN
Freq: Once | INTRAVENOUS | Status: AC
  Administered 2013-07-30: 09:00:00 via INTRAVENOUS

## 2013-07-30 MED ORDER — DEXAMETHASONE SODIUM PHOSPHATE 20 MG/5ML IJ SOLN
INTRAMUSCULAR | Status: AC
  Filled 2013-07-30: qty 5

## 2013-07-30 MED ORDER — ONDANSETRON 8 MG/50ML IVPB (CHCC)
8.0000 mg | Freq: Once | INTRAVENOUS | Status: AC
  Administered 2013-07-30: 8 mg via INTRAVENOUS

## 2013-07-30 MED ORDER — VINORELBINE TARTRATE CHEMO INJECTION 50 MG/5ML
30.0000 mg/m2 | INTRAVENOUS | Status: DC
  Administered 2013-07-30: 50 mg via INTRAVENOUS
  Filled 2013-07-30: qty 5

## 2013-07-30 MED ORDER — DEXAMETHASONE SODIUM PHOSPHATE 20 MG/5ML IJ SOLN
12.0000 mg | Freq: Once | INTRAMUSCULAR | Status: AC
  Administered 2013-07-30: 12 mg via INTRAVENOUS

## 2013-07-30 NOTE — Patient Instructions (Signed)
[facility] Discharge Instructions for Patients Receiving Chemotherapy  Today you received the following chemotherapy agents NAVELBINE  To help prevent nausea and vomiting after your treatment, we encourage you to take your nausea medication AS NEEDED.   If you develop nausea and vomiting that is not controlled by your nausea medication, call the clinic.   BELOW ARE SYMPTOMS THAT SHOULD BE REPORTED IMMEDIATELY:  *FEVER GREATER THAN 100.5 F  *CHILLS WITH OR WITHOUT FEVER  NAUSEA AND VOMITING THAT IS NOT CONTROLLED WITH YOUR NAUSEA MEDICATION  *UNUSUAL SHORTNESS OF BREATH  *UNUSUAL BRUISING OR BLEEDING  TENDERNESS IN MOUTH AND THROAT WITH OR WITHOUT PRESENCE OF ULCERS  *URINARY PROBLEMS  *BOWEL PROBLEMS  UNUSUAL RASH Items with * indicate a potential emergency and should be followed up as soon as possible.  Feel free to call the clinic you have any questions or concerns. The clinic phone number is [phone].

## 2013-07-31 ENCOUNTER — Ambulatory Visit (HOSPITAL_BASED_OUTPATIENT_CLINIC_OR_DEPARTMENT_OTHER): Payer: Medicare Other

## 2013-07-31 ENCOUNTER — Ambulatory Visit: Payer: Medicare Other | Admitting: Cardiothoracic Surgery

## 2013-07-31 VITALS — BP 134/67 | HR 69 | Temp 98.1°F

## 2013-07-31 DIAGNOSIS — Z5189 Encounter for other specified aftercare: Secondary | ICD-10-CM

## 2013-07-31 DIAGNOSIS — C3410 Malignant neoplasm of upper lobe, unspecified bronchus or lung: Secondary | ICD-10-CM

## 2013-07-31 DIAGNOSIS — C3492 Malignant neoplasm of unspecified part of left bronchus or lung: Secondary | ICD-10-CM

## 2013-07-31 DIAGNOSIS — D630 Anemia in neoplastic disease: Secondary | ICD-10-CM

## 2013-07-31 MED ORDER — PEGFILGRASTIM INJECTION 6 MG/0.6ML
6.0000 mg | Freq: Once | SUBCUTANEOUS | Status: AC
  Administered 2013-07-31: 6 mg via SUBCUTANEOUS
  Filled 2013-07-31: qty 0.6

## 2013-08-11 ENCOUNTER — Telehealth: Payer: Self-pay | Admitting: Hematology and Oncology

## 2013-08-11 NOTE — Telephone Encounter (Signed)
pt called to confirm appts...pt ok and aware

## 2013-08-13 ENCOUNTER — Ambulatory Visit: Payer: Medicare Other

## 2013-08-20 ENCOUNTER — Other Ambulatory Visit: Payer: Self-pay | Admitting: Hematology and Oncology

## 2013-08-20 ENCOUNTER — Ambulatory Visit: Payer: Medicare Other

## 2013-08-20 ENCOUNTER — Encounter: Payer: Self-pay | Admitting: Hematology and Oncology

## 2013-08-20 ENCOUNTER — Ambulatory Visit (HOSPITAL_BASED_OUTPATIENT_CLINIC_OR_DEPARTMENT_OTHER): Payer: Medicare Other | Admitting: Hematology and Oncology

## 2013-08-20 ENCOUNTER — Encounter: Payer: Self-pay | Admitting: *Deleted

## 2013-08-20 ENCOUNTER — Other Ambulatory Visit (HOSPITAL_BASED_OUTPATIENT_CLINIC_OR_DEPARTMENT_OTHER): Payer: Medicare Other

## 2013-08-20 ENCOUNTER — Ambulatory Visit: Payer: Medicare Other | Admitting: Nutrition

## 2013-08-20 VITALS — BP 158/66 | HR 60 | Temp 98.2°F | Resp 20 | Ht 60.0 in | Wt 152.0 lb

## 2013-08-20 DIAGNOSIS — D630 Anemia in neoplastic disease: Secondary | ICD-10-CM

## 2013-08-20 DIAGNOSIS — R209 Unspecified disturbances of skin sensation: Secondary | ICD-10-CM | POA: Diagnosis not present

## 2013-08-20 DIAGNOSIS — C3492 Malignant neoplasm of unspecified part of left bronchus or lung: Secondary | ICD-10-CM

## 2013-08-20 DIAGNOSIS — L7682 Other postprocedural complications of skin and subcutaneous tissue: Secondary | ICD-10-CM

## 2013-08-20 DIAGNOSIS — C3410 Malignant neoplasm of upper lobe, unspecified bronchus or lung: Secondary | ICD-10-CM

## 2013-08-20 DIAGNOSIS — C3490 Malignant neoplasm of unspecified part of unspecified bronchus or lung: Secondary | ICD-10-CM

## 2013-08-20 LAB — COMPREHENSIVE METABOLIC PANEL (CC13)
ALT: 8 U/L (ref 0–55)
ANION GAP: 7 meq/L (ref 3–11)
AST: 17 U/L (ref 5–34)
Albumin: 3.7 g/dL (ref 3.5–5.0)
Alkaline Phosphatase: 70 U/L (ref 40–150)
BUN: 17.8 mg/dL (ref 7.0–26.0)
CO2: 24 meq/L (ref 22–29)
CREATININE: 1.2 mg/dL — AB (ref 0.6–1.1)
Calcium: 9.5 mg/dL (ref 8.4–10.4)
Chloride: 109 mEq/L (ref 98–109)
GLUCOSE: 87 mg/dL (ref 70–140)
Potassium: 4.4 mEq/L (ref 3.5–5.1)
Sodium: 140 mEq/L (ref 136–145)
TOTAL PROTEIN: 6.5 g/dL (ref 6.4–8.3)
Total Bilirubin: 0.25 mg/dL (ref 0.20–1.20)

## 2013-08-20 LAB — CBC WITH DIFFERENTIAL/PLATELET
BASO%: 1.1 % (ref 0.0–2.0)
Basophils Absolute: 0.1 10*3/uL (ref 0.0–0.1)
EOS ABS: 0 10*3/uL (ref 0.0–0.5)
EOS%: 0.1 % (ref 0.0–7.0)
HEMATOCRIT: 27.8 % — AB (ref 34.8–46.6)
HEMOGLOBIN: 8.9 g/dL — AB (ref 11.6–15.9)
LYMPH%: 13.5 % — AB (ref 14.0–49.7)
MCH: 31 pg (ref 25.1–34.0)
MCHC: 32 g/dL (ref 31.5–36.0)
MCV: 96.9 fL (ref 79.5–101.0)
MONO#: 1.1 10*3/uL — AB (ref 0.1–0.9)
MONO%: 14.2 % — ABNORMAL HIGH (ref 0.0–14.0)
NEUT%: 71.1 % (ref 38.4–76.8)
NEUTROS ABS: 5.4 10*3/uL (ref 1.5–6.5)
PLATELETS: 214 10*3/uL (ref 145–400)
RBC: 2.87 10*6/uL — ABNORMAL LOW (ref 3.70–5.45)
RDW: 21.2 % — ABNORMAL HIGH (ref 11.2–14.5)
WBC: 7.5 10*3/uL (ref 3.9–10.3)
lymph#: 1 10*3/uL (ref 0.9–3.3)

## 2013-08-20 LAB — HOLD TUBE, BLOOD BANK

## 2013-08-20 MED ORDER — GABAPENTIN 300 MG PO CAPS: 300.0000 mg | ORAL_CAPSULE | Freq: Two times a day (BID) | ORAL | Status: DC

## 2013-08-20 NOTE — Progress Notes (Signed)
[facility] OFFICE PROGRESS NOTE  Patient Care Team: Glendale Chard, MD as PCP - General (Internal Medicine) Heath Lark, MD as Consulting Physician (Hematology and Oncology) [name], MD as Consulting Physician (Cardiothoracic Surgery)  SUMMARY OF ONCOLOGIC HISTORY: Oncology History   Non-small cell lung cancer, adenocarcinoma   Primary site: Lung (Left)   Staging method: AJCC 7th Edition   Clinical: Stage IB (T2a, N0, M0) signed by Heath Lark, MD on 04/29/2013  9:01 PM   Pathologic: Stage IB (T2a, N0, cM0) signed by [name], MD on 04/09/2013 12:51 PM   Summary: Stage IB (T2a, N0, cM0)       Non-small cell lung cancer   02/26/2013 Imaging CXR for evaluation of cough showed new left lung mass   02/28/2013 Imaging CT chest showed left new lung nodule   03/14/2013 Imaging PET/CT scan showed localized disease in the left lung   04/07/2013 Surgery She underwent bronchoscopy and left lower lobe resection and lymph node sampling with negative margins   05/06/2013 Procedure The patient has placement of Infuse-a-Port   05/21/2013 - 07/30/2013 Chemotherapy She completed 4 cycles of adjuvant chemotherapy with navelbine and cisplatin   07/01/2013 Imaging CT scan of the chest shows stable appearance.   07/02/2013 Adverse Reaction Dose of chemotherapy was adjusted further due to severe anemia.    INTERVAL HISTORY: Please see below for problem oriented charting. The patient has completed treatment recently. She continues to complain intermittent chest wall pain. She complained of fatigue.  REVIEW OF SYSTEMS:   Constitutional: Denies fevers, chills or abnormal weight loss Eyes: Denies blurriness of vision Ears, nose, mouth, throat, and face: Denies mucositis or sore throat Respiratory: Denies cough, dyspnea or wheezes Cardiovascular: Denies palpitation, chest discomfort or lower extremity swelling Gastrointestinal:  Denies nausea, heartburn or change in bowel habits Skin:  Denies abnormal skin rashes Lymphatics: Denies new lymphadenopathy or easy bruising Neurological:Denies numbness, tingling or new weaknesses Behavioral/Psych: Mood is stable, no new changes  All other systems were reviewed with the patient and are negative.  I have reviewed the past medical history, past surgical history, social history and family history with the patient and they are unchanged from previous note.  ALLERGIES:  is allergic to aspirin; lactose intolerance (gi); lisinopril; and adhesive.  MEDICATIONS:  Current Outpatient Prescriptions  Medication Sig Dispense Refill  . Cholecalciferol (VITAMIN D-3) 1000 UNITS CAPS Take 1 capsule by mouth daily.      . clopidogrel (PLAVIX) 75 MG tablet Take 75 mg by mouth daily with breakfast.       . lidocaine-prilocaine (EMLA) cream Apply 1 application topically as needed.  30 g  0  . magnesium oxide (MAG-OX) 400 MG tablet Take 400 mg by mouth daily.      . metoprolol succinate (TOPROL-XL) 25 MG 24 hr tablet Take 25 mg by mouth daily.       . Multiple Vitamin (MULTIVITAMIN) tablet Take 1 tablet by mouth daily.      Marland Kitchen oxyCODONE-acetaminophen (PERCOCET/ROXICET) 5-325 MG per tablet Take 1 tablet by mouth every 6 (six) hours as needed for severe pain.  90 tablet  0  . tobramycin (TOBREX) 0.3 % ophthalmic solution PLACE 1 DROP INTO THE RIGHT EYE EVERY 4 HOURS  5 mL  0  . travoprost, benzalkonium, (TRAVATAN) 0.004 % ophthalmic solution Place 1 drop into both eyes at bedtime.      . valsartan (DIOVAN) 80 MG tablet Take 1 tablet (80 mg total) by mouth daily.      Marland Kitchen  gabapentin (NEURONTIN) 300 MG capsule TAKE ONE CAPSULE BY MOUTH TWICE DAILY  180 capsule  3  . ondansetron (ZOFRAN) 8 MG tablet Take 1 tablet (8 mg total) by mouth 2 (two) times daily as needed. Start on the third day after chemotherapy.  30 tablet  1  . prochlorperazine (COMPAZINE) 10 MG tablet TAKE 1 TABLET BY MOUTH EVERY 6 HOURS AS NEEDED FOR NAUSEA OR VOMITING  30 tablet  0   No  current facility-administered medications for this visit.    PHYSICAL EXAMINATION: ECOG PERFORMANCE STATUS: 1 - Symptomatic but completely ambulatory  Filed Vitals:   08/20/13 1053  BP: 158/66  Pulse: 60  Temp: 98.2 F (36.8 C)  Resp: 20   Filed Weights   08/20/13 1053  Weight: 152 lb (68.947 kg)    GENERAL:alert, no distress and comfortable SKIN: skin color, texture, turgor are normal, no rashes or significant lesions EYES: normal, Conjunctiva are pink and non-injected, sclera clear Musculoskeletal:no cyanosis of digits and no clubbing  NEURO: alert & oriented x 3 with fluent speech, no focal motor/sensory deficits  LABORATORY DATA:  I have reviewed the data as listed    Component Value Date/Time   NA 140 08/20/2013 1036   NA 141 05/06/2013 0619   K 4.4 08/20/2013 1036   K 4.0 05/06/2013 0619   CL 106 05/06/2013 0619   CO2 24 08/20/2013 1036   CO2 20 05/06/2013 0619   GLUCOSE 87 08/20/2013 1036   GLUCOSE 97 05/06/2013 0619   BUN 17.8 08/20/2013 1036   BUN 14 05/06/2013 0619   CREATININE 1.2* 08/20/2013 1036   CREATININE 1.02 05/06/2013 0619   CALCIUM 9.5 08/20/2013 1036   CALCIUM 9.5 05/06/2013 0619   PROT 6.5 08/20/2013 1036   PROT 6.5 05/06/2013 0619   ALBUMIN 3.7 08/20/2013 1036   ALBUMIN 3.5 05/06/2013 0619   AST 17 08/20/2013 1036   AST 24 05/06/2013 0619   ALT 8 08/20/2013 1036   ALT 17 05/06/2013 0619   ALKPHOS 70 08/20/2013 1036   ALKPHOS 70 05/06/2013 0619   BILITOT 0.25 08/20/2013 1036   BILITOT <0.2* 05/06/2013 0619   GFRNONAA 52* 05/06/2013 0619   GFRAA 61* 05/06/2013 0619    No results found for this basename: SPEP, UPEP,  kappa and lambda light chains    Lab Results  Component Value Date   WBC 7.5 08/20/2013   NEUTROABS 5.4 08/20/2013   HGB 8.9* 08/20/2013   HCT 27.8* 08/20/2013   MCV 96.9 08/20/2013   PLT 214 08/20/2013      Chemistry      Component Value Date/Time   NA 140 08/20/2013 1036   NA 141 05/06/2013 0619   K 4.4 08/20/2013 1036   K 4.0 05/06/2013 0619    CL 106 05/06/2013 0619   CO2 24 08/20/2013 1036   CO2 20 05/06/2013 0619   BUN 17.8 08/20/2013 1036   BUN 14 05/06/2013 0619   CREATININE 1.2* 08/20/2013 1036   CREATININE 1.02 05/06/2013 0619      Component Value Date/Time   CALCIUM 9.5 08/20/2013 1036   CALCIUM 9.5 05/06/2013 0619   ALKPHOS 70 08/20/2013 1036   ALKPHOS 70 05/06/2013 0619   AST 17 08/20/2013 1036   AST 24 05/06/2013 0619   ALT 8 08/20/2013 1036   ALT 17 05/06/2013 0619   BILITOT 0.25 08/20/2013 1036   BILITOT <0.2* 05/06/2013 0619      ASSESSMENT & PLAN:  Non-small cell lung cancer The patient has completed all  the treatment. She complained of feeling fatigued. I suspect she will continue to get better. I will see her back in 3 months with history, physical examination and blood work. I plan to repeat imaging study at the end of the year and if that is negative, we will get her port removed.  Anemia in neoplastic disease This is likely anemia of chronic disease. The patient denies recent history of bleeding such as epistaxis, hematuria or hematochezia. She is asymptomatic from the anemia. We will observe for now.  She does not require transfusion now.    Incisional pain I gave her prescription on Neurontin to try. I suspect she has neuropathic pain. I wonder about side effects including risk of sedation and constipation. She is willing to try.   No orders of the defined types were placed in this encounter.   All questions were answered. The patient knows to call the clinic with any problems, questions or concerns. No barriers to learning was detected. I spent 15 minutes counseling the patient face to face. The total time spent in the appointment was 20 minutes and more than 50% was on counseling and review of test results     [facility], [place], MD 08/20/2013 5:28 PM

## 2013-08-20 NOTE — Progress Notes (Signed)
08/20/13 [id number]. Observation Met briefly with Mrs. [name] and her daughter [name] today. Mrs. [name] has completed adjuvant  chemotherapy for her lung cancer. I reminded Mrs. [name] that we would follow her for the study using her medical record and possibly a phone call. She is in agreement for the calls and asks that we talk with her daughter, [name] if more oinformatin is needed than is available in her medical record. [name] is also in agreement.  Thanked her for her participation.

## 2013-08-20 NOTE — Assessment & Plan Note (Signed)
I gave her prescription on Neurontin to try. I suspect she has neuropathic pain. I wonder about side effects including risk of sedation and constipation. She is willing to try.

## 2013-08-20 NOTE — Progress Notes (Signed)
Nutrition followup completed with patient in the exam room.  Patient has been treated for non-small cell lung cancer.  However, she is not having chemotherapy today.  Weight decreased slightly and documented as 152 pounds July 29 down from 153.1 pounds July 1.  Patient reports nausea has improved.  Her appetite is better.  She continues to drink boost twice a day.  Nutrition diagnosis: Inadequate oral intake continues.  Intervention: Patient educated to increase boost +3 times a day.  Provided additional samples and coupons.  Encouraged compliance with medications for controlling nausea.  Teach back method used.  Monitoring, evaluation, goals: Patient is working to increase oral intake and continues with oral nutrition supplements.  She has had a 1 pound weight loss over the past 4 weeks.  Next visit: No followup is scheduled at this time.  I am available to assist patient as needed.  She has my contact information.  **Disclaimer: This note was dictated with voice recognition software. Similar sounding words can inadvertently be transcribed and this note may contain transcription errors which may not have been corrected upon publication of note.**

## 2013-08-20 NOTE — Assessment & Plan Note (Signed)
The patient has completed all the treatment. She complained of feeling fatigued. I suspect she will continue to get better. I will see her back in 3 months with history, physical examination and blood work. I plan to repeat imaging study at the end of the year and if that is negative, we will get her port removed.

## 2013-08-20 NOTE — Assessment & Plan Note (Signed)
This is likely anemia of chronic disease. The patient denies recent history of bleeding such as epistaxis, hematuria or hematochezia. She is asymptomatic from the anemia. We will observe for now.  She does not require transfusion now.   

## 2013-08-21 ENCOUNTER — Telehealth: Payer: Self-pay | Admitting: *Deleted

## 2013-08-21 NOTE — Telephone Encounter (Signed)
Dau states Dr. Alvy Bimler instructed pt to stop taking her Metamucil.  Pt doesn't want to stop it because it keeps her from having cramps.  So daughter wants to know if ok for pt to continue taking her Metamucil?

## 2013-08-21 NOTE — Telephone Encounter (Signed)
Yes, OK 

## 2013-08-21 NOTE — Telephone Encounter (Signed)
Instructed dau ok for pt to continue taking Metamucil per Dr. Alvy Bimler.  Make sure she drinks plenty of fluid with it.  Dau verbalized understanding.

## 2013-08-22 ENCOUNTER — Telehealth: Payer: Self-pay | Admitting: Hematology and Oncology

## 2013-08-22 NOTE — Telephone Encounter (Signed)
lvm for pt regarding to Aug, SEpt and OCT appts....maield pt appt sched/avs and letter

## 2013-08-26 DIAGNOSIS — N959 Unspecified menopausal and perimenopausal disorder: Secondary | ICD-10-CM | POA: Diagnosis not present

## 2013-08-27 ENCOUNTER — Encounter: Payer: Self-pay | Admitting: *Deleted

## 2013-08-28 DIAGNOSIS — N959 Unspecified menopausal and perimenopausal disorder: Secondary | ICD-10-CM | POA: Diagnosis not present

## 2013-09-02 ENCOUNTER — Other Ambulatory Visit: Payer: Self-pay | Admitting: Hematology and Oncology

## 2013-09-10 ENCOUNTER — Ambulatory Visit (HOSPITAL_BASED_OUTPATIENT_CLINIC_OR_DEPARTMENT_OTHER): Payer: Medicare Other

## 2013-09-10 VITALS — BP 154/59 | HR 64 | Temp 98.2°F

## 2013-09-10 DIAGNOSIS — C3410 Malignant neoplasm of upper lobe, unspecified bronchus or lung: Secondary | ICD-10-CM

## 2013-09-10 DIAGNOSIS — Z452 Encounter for adjustment and management of vascular access device: Secondary | ICD-10-CM

## 2013-09-10 DIAGNOSIS — Z95828 Presence of other vascular implants and grafts: Secondary | ICD-10-CM

## 2013-09-10 MED ORDER — SODIUM CHLORIDE 0.9 % IJ SOLN
10.0000 mL | INTRAMUSCULAR | Status: DC | PRN
  Administered 2013-09-10: 10 mL via INTRAVENOUS
  Filled 2013-09-10: qty 10

## 2013-09-10 MED ORDER — HEPARIN SOD (PORK) LOCK FLUSH 100 UNIT/ML IV SOLN
500.0000 [IU] | Freq: Once | INTRAVENOUS | Status: AC
  Administered 2013-09-10: 500 [IU] via INTRAVENOUS
  Filled 2013-09-10: qty 5

## 2013-09-10 NOTE — Patient Instructions (Signed)

## 2013-09-11 ENCOUNTER — Other Ambulatory Visit: Payer: Self-pay

## 2013-09-11 DIAGNOSIS — Z1231 Encounter for screening mammogram for malignant neoplasm of breast: Secondary | ICD-10-CM

## 2013-09-18 ENCOUNTER — Ambulatory Visit
Admission: RE | Admit: 2013-09-18 | Discharge: 2013-09-18 | Disposition: A | Discharge status: Home / Self Care | Payer: Medicare Other | Source: Ambulatory Visit

## 2013-09-18 DIAGNOSIS — Z1231 Encounter for screening mammogram for malignant neoplasm of breast: Secondary | ICD-10-CM

## 2013-10-08 ENCOUNTER — Ambulatory Visit (HOSPITAL_BASED_OUTPATIENT_CLINIC_OR_DEPARTMENT_OTHER): Payer: Medicare Other

## 2013-10-08 VITALS — BP 133/62 | HR 63 | Temp 97.5°F

## 2013-10-08 DIAGNOSIS — Z452 Encounter for adjustment and management of vascular access device: Secondary | ICD-10-CM | POA: Diagnosis not present

## 2013-10-08 DIAGNOSIS — C3410 Malignant neoplasm of upper lobe, unspecified bronchus or lung: Secondary | ICD-10-CM

## 2013-10-08 DIAGNOSIS — Z95828 Presence of other vascular implants and grafts: Secondary | ICD-10-CM

## 2013-10-08 MED ORDER — HEPARIN SOD (PORK) LOCK FLUSH 100 UNIT/ML IV SOLN
500.0000 [IU] | Freq: Once | INTRAVENOUS | Status: AC
  Administered 2013-10-08: 500 [IU] via INTRAVENOUS
  Filled 2013-10-08: qty 5

## 2013-10-08 MED ORDER — SODIUM CHLORIDE 0.9 % IJ SOLN
10.0000 mL | INTRAMUSCULAR | Status: DC | PRN
Start: 2013-10-08 — End: 2013-10-08
  Administered 2013-10-08: 10 mL via INTRAVENOUS
  Filled 2013-10-08: qty 10

## 2013-10-08 NOTE — Patient Instructions (Signed)

## 2013-10-15 ENCOUNTER — Telehealth: Payer: Self-pay | Admitting: Medical Oncology

## 2013-10-15 DIAGNOSIS — N183 Chronic kidney disease, stage 3 unspecified: Secondary | ICD-10-CM | POA: Diagnosis not present

## 2013-10-15 DIAGNOSIS — E785 Hyperlipidemia, unspecified: Secondary | ICD-10-CM | POA: Diagnosis not present

## 2013-10-15 DIAGNOSIS — Z79899 Other long term (current) drug therapy: Secondary | ICD-10-CM | POA: Diagnosis not present

## 2013-10-15 DIAGNOSIS — Z23 Encounter for immunization: Secondary | ICD-10-CM | POA: Diagnosis not present

## 2013-10-15 DIAGNOSIS — E559 Vitamin D deficiency, unspecified: Secondary | ICD-10-CM | POA: Diagnosis not present

## 2013-10-15 DIAGNOSIS — I129 Hypertensive chronic kidney disease with stage 1 through stage 4 chronic kidney disease, or unspecified chronic kidney disease: Secondary | ICD-10-CM | POA: Diagnosis not present

## 2013-10-15 NOTE — Telephone Encounter (Signed)
Patient's daughter, [name], called to ask if it is all right for patient to review flu vaccine from her PCP. Confirmed with daughter that patient is not ill or has been running a fever and informed her that Per Dr. Alvy Bimler, ok for patient to have flu vaccine. Daughter expressed thanks, denies further questions at this time.

## 2013-10-30 ENCOUNTER — Telehealth: Payer: Self-pay | Admitting: Medical Oncology

## 2013-10-30 NOTE — Telephone Encounter (Signed)
Daughter called concerned patient stung this am by two bees, one sting close to eye and another on her hand. Daughter confirms patient not allergic to bee stings as well as reports no swelling to area where stung. Informed daughter to just keep an a watch on it, should swelling occur where it is affecting her vision to f/u with call to her PCP. Daughter expressed thanks, knows to call office with further questions/concerns.  F/U appt 10/15 with MD

## 2013-11-06 ENCOUNTER — Encounter: Payer: Self-pay | Admitting: Hematology and Oncology

## 2013-11-06 ENCOUNTER — Other Ambulatory Visit (HOSPITAL_BASED_OUTPATIENT_CLINIC_OR_DEPARTMENT_OTHER): Payer: Medicare Other

## 2013-11-06 ENCOUNTER — Ambulatory Visit (HOSPITAL_BASED_OUTPATIENT_CLINIC_OR_DEPARTMENT_OTHER): Payer: Medicare Other | Admitting: Hematology and Oncology

## 2013-11-06 ENCOUNTER — Telehealth: Payer: Self-pay | Admitting: Hematology and Oncology

## 2013-11-06 ENCOUNTER — Ambulatory Visit: Payer: Medicare Other

## 2013-11-06 VITALS — BP 166/69 | HR 62 | Resp 18 | Ht 60.0 in | Wt 161.3 lb

## 2013-11-06 DIAGNOSIS — N182 Chronic kidney disease, stage 2 (mild): Secondary | ICD-10-CM

## 2013-11-06 DIAGNOSIS — N183 Chronic kidney disease, stage 3 (moderate): Secondary | ICD-10-CM

## 2013-11-06 DIAGNOSIS — D630 Anemia in neoplastic disease: Secondary | ICD-10-CM | POA: Diagnosis not present

## 2013-11-06 DIAGNOSIS — C3492 Malignant neoplasm of unspecified part of left bronchus or lung: Secondary | ICD-10-CM

## 2013-11-06 DIAGNOSIS — R52 Pain, unspecified: Secondary | ICD-10-CM

## 2013-11-06 DIAGNOSIS — C3490 Malignant neoplasm of unspecified part of unspecified bronchus or lung: Secondary | ICD-10-CM

## 2013-11-06 DIAGNOSIS — I129 Hypertensive chronic kidney disease with stage 1 through stage 4 chronic kidney disease, or unspecified chronic kidney disease: Secondary | ICD-10-CM | POA: Insufficient documentation

## 2013-11-06 DIAGNOSIS — C3432 Malignant neoplasm of lower lobe, left bronchus or lung: Secondary | ICD-10-CM | POA: Diagnosis not present

## 2013-11-06 DIAGNOSIS — N184 Chronic kidney disease, stage 4 (severe): Secondary | ICD-10-CM

## 2013-11-06 DIAGNOSIS — L7682 Other postprocedural complications of skin and subcutaneous tissue: Secondary | ICD-10-CM

## 2013-11-06 DIAGNOSIS — Z95828 Presence of other vascular implants and grafts: Secondary | ICD-10-CM

## 2013-11-06 DIAGNOSIS — I10 Essential (primary) hypertension: Secondary | ICD-10-CM

## 2013-11-06 LAB — CBC WITH DIFFERENTIAL/PLATELET
BASO%: 0.9 % (ref 0.0–2.0)
BASOS ABS: 0 10*3/uL (ref 0.0–0.1)
EOS ABS: 0.1 10*3/uL (ref 0.0–0.5)
EOS%: 2.4 % (ref 0.0–7.0)
HCT: 33.6 % — ABNORMAL LOW (ref 34.8–46.6)
HEMOGLOBIN: 11 g/dL — AB (ref 11.6–15.9)
LYMPH%: 20.9 % (ref 14.0–49.7)
MCH: 30.4 pg (ref 25.1–34.0)
MCHC: 32.8 g/dL (ref 31.5–36.0)
MCV: 92.7 fL (ref 79.5–101.0)
MONO#: 0.7 10*3/uL (ref 0.1–0.9)
MONO%: 14.9 % — AB (ref 0.0–14.0)
NEUT#: 2.9 10*3/uL (ref 1.5–6.5)
NEUT%: 60.9 % (ref 38.4–76.8)
Platelets: 244 10*3/uL (ref 145–400)
RBC: 3.62 10*6/uL — AB (ref 3.70–5.45)
RDW: 15.9 % — ABNORMAL HIGH (ref 11.2–14.5)
WBC: 4.7 10*3/uL (ref 3.9–10.3)
lymph#: 1 10*3/uL (ref 0.9–3.3)

## 2013-11-06 LAB — COMPREHENSIVE METABOLIC PANEL (CC13)
ALK PHOS: 66 U/L (ref 40–150)
ALT: 22 U/L (ref 0–55)
ANION GAP: 9 meq/L (ref 3–11)
AST: 24 U/L (ref 5–34)
Albumin: 3.7 g/dL (ref 3.5–5.0)
BILIRUBIN TOTAL: 0.22 mg/dL (ref 0.20–1.20)
BUN: 16.6 mg/dL (ref 7.0–26.0)
CO2: 21 meq/L — AB (ref 22–29)
Calcium: 9.4 mg/dL (ref 8.4–10.4)
Chloride: 111 mEq/L — ABNORMAL HIGH (ref 98–109)
Creatinine: 1.4 mg/dL — ABNORMAL HIGH (ref 0.6–1.1)
GLUCOSE: 100 mg/dL (ref 70–140)
Potassium: 4.3 mEq/L (ref 3.5–5.1)
Sodium: 141 mEq/L (ref 136–145)
Total Protein: 6.8 g/dL (ref 6.4–8.3)

## 2013-11-06 MED ORDER — HEPARIN SOD (PORK) LOCK FLUSH 100 UNIT/ML IV SOLN
500.0000 [IU] | Freq: Once | INTRAVENOUS | Status: AC
  Administered 2013-11-06: 500 [IU] via INTRAVENOUS
  Filled 2013-11-06: qty 5

## 2013-11-06 MED ORDER — AMLODIPINE BESYLATE 10 MG PO TABS: 10.0000 mg | ORAL_TABLET | Freq: Every day | ORAL | Status: DC

## 2013-11-06 MED ORDER — SODIUM CHLORIDE 0.9 % IJ SOLN
10.0000 mL | INTRAMUSCULAR | Status: DC | PRN
  Administered 2013-11-06: 10 mL via INTRAVENOUS
  Filled 2013-11-06: qty 10

## 2013-11-06 NOTE — Patient Instructions (Signed)

## 2013-11-06 NOTE — Assessment & Plan Note (Signed)
This is improving since discontinuation of chemotherapy. I suspect there may be a component of anemia chronic disease and chronic kidney disease. She is not symptomatic. Recommend observation only.

## 2013-11-06 NOTE — Telephone Encounter (Signed)
gv adn printed appt sched and avs for pt for Dec °

## 2013-11-06 NOTE — Assessment & Plan Note (Signed)
The patient has completed all the treatment. Clinically, she is improving since discontinuation of chemotherapy. I plan to repeat imaging study at the end of the year and if that is negative, we will get her port removed.

## 2013-11-06 NOTE — Assessment & Plan Note (Signed)
She declined gabapentin due to fear for side effects. She will continue taking Tylenol as needed.

## 2013-11-06 NOTE — Progress Notes (Signed)
[name] OFFICE PROGRESS NOTE  Patient Care Team: Glendale Chard, MD as PCP - General (Internal Medicine) Heath Lark, MD as Consulting Physician (Hematology and Oncology) [name], MD as Consulting Physician (Cardiothoracic Surgery)  SUMMARY OF ONCOLOGIC HISTORY: Oncology History   Non-small cell lung cancer, adenocarcinoma   Primary site: Lung (Left)   Staging method: AJCC 7th Edition   Clinical: Stage IB (T2a, N0, M0) signed by Heath Lark, MD on 04/29/2013  9:01 PM   Pathologic: Stage IB (T2a, N0, cM0) signed by [name], MD on 04/09/2013 12:51 PM   Summary: Stage IB (T2a, N0, cM0)       Non-small cell lung cancer   02/26/2013 Imaging CXR for evaluation of cough showed new left lung mass   02/28/2013 Imaging CT chest showed left new lung nodule   03/14/2013 Imaging PET/CT scan showed localized disease in the left lung   04/07/2013 Surgery She underwent bronchoscopy and left lower lobe resection and lymph node sampling with negative margins   05/06/2013 Procedure The patient has placement of Infuse-a-Port   05/21/2013 - 07/30/2013 Chemotherapy She completed 4 cycles of adjuvant chemotherapy with navelbine and cisplatin   07/01/2013 Imaging CT scan of the chest shows stable appearance.   07/02/2013 Adverse Reaction Dose of chemotherapy was adjusted further due to severe anemia.    INTERVAL HISTORY: Please see below for problem oriented charting. She feels well. Denies recent cough. She has intermittent chest wall pain from prior surgery but that does not bother her too much.  REVIEW OF SYSTEMS:   Constitutional: Denies fevers, chills or abnormal weight loss Eyes: Denies blurriness of vision Ears, nose, mouth, throat, and face: Denies mucositis or sore throat Respiratory: Denies cough, dyspnea or wheezes Cardiovascular: Denies palpitation, chest discomfort or lower extremity swelling Gastrointestinal:  Denies nausea, heartburn or change in bowel  habits Skin: Denies abnormal skin rashes Lymphatics: Denies new lymphadenopathy or easy bruising Neurological:Denies numbness, tingling or new weaknesses Behavioral/Psych: Mood is stable, no new changes  All other systems were reviewed with the patient and are negative.  I have reviewed the past medical history, past surgical history, social history and family history with the patient and they are unchanged from previous note.  ALLERGIES:  is allergic to aspirin; lactose intolerance (gi); lisinopril; and adhesive.  MEDICATIONS:  Current Outpatient Prescriptions  Medication Sig Dispense Refill  . Cholecalciferol (VITAMIN D-3) 1000 UNITS CAPS Take 1 capsule by mouth daily.      . clopidogrel (PLAVIX) 75 MG tablet Take 75 mg by mouth daily with breakfast.       . lidocaine-prilocaine (EMLA) cream Apply 1 application topically as needed.  30 g  0  . magnesium oxide (MAG-OX) 400 MG tablet Take 400 mg by mouth daily.      . metoprolol succinate (TOPROL-XL) 25 MG 24 hr tablet Take 25 mg by mouth daily.       . Multiple Vitamin (MULTIVITAMIN) tablet Take 1 tablet by mouth daily.      Marland Kitchen oxyCODONE-acetaminophen (PERCOCET/ROXICET) 5-325 MG per tablet Take 1 tablet by mouth every 6 (six) hours as needed for severe pain.  90 tablet  0  . prochlorperazine (COMPAZINE) 10 MG tablet TAKE 1 TABLET BY MOUTH EVERY 6 HOURS AS NEEDED FOR NAUSEA OR VOMITING  30 tablet  0  . tobramycin (TOBREX) 0.3 % ophthalmic solution PLACE 1 DROP INTO THE RIGHT EYE EVERY 4 HOURS  5 mL  0  . travoprost, benzalkonium, (TRAVATAN) 0.004 %  ophthalmic solution Place 1 drop into both eyes at bedtime.      . valsartan (DIOVAN) 80 MG tablet Take 1 tablet (80 mg total) by mouth daily.      Marland Kitchen amLODipine (NORVASC) 10 MG tablet Take 1 tablet (10 mg total) by mouth daily.  90 tablet  1   No current facility-administered medications for this visit.    PHYSICAL EXAMINATION: ECOG PERFORMANCE STATUS: 1 - Symptomatic but completely  ambulatory  Filed Vitals:   11/06/13 1102  BP: 166/69  Pulse: 62  Resp: 18   Filed Weights   11/06/13 1102  Weight: 161 lb 4.8 oz (73.165 kg)    GENERAL:alert, no distress and comfortable SKIN: skin color, texture, turgor are normal, no rashes or significant lesions EYES: normal, Conjunctiva are pink and non-injected, sclera clear Musculoskeletal:no cyanosis of digits and no clubbing  NEURO: alert & oriented x 3 with fluent speech, no focal motor/sensory deficits  LABORATORY DATA:  I have reviewed the data as listed    Component Value Date/Time   NA 141 11/06/2013 1031   NA 141 05/06/2013 0619   K 4.3 11/06/2013 1031   K 4.0 05/06/2013 0619   CL 106 05/06/2013 0619   CO2 21* 11/06/2013 1031   CO2 20 05/06/2013 0619   GLUCOSE 100 11/06/2013 1031   GLUCOSE 97 05/06/2013 0619   BUN 16.6 11/06/2013 1031   BUN 14 05/06/2013 0619   CREATININE 1.4* 11/06/2013 1031   CREATININE 1.02 05/06/2013 0619   CALCIUM 9.4 11/06/2013 1031   CALCIUM 9.5 05/06/2013 0619   PROT 6.8 11/06/2013 1031   PROT 6.5 05/06/2013 0619   ALBUMIN 3.7 11/06/2013 1031   ALBUMIN 3.5 05/06/2013 0619   AST 24 11/06/2013 1031   AST 24 05/06/2013 0619   ALT 22 11/06/2013 1031   ALT 17 05/06/2013 0619   ALKPHOS 66 11/06/2013 1031   ALKPHOS 70 05/06/2013 0619   BILITOT 0.22 11/06/2013 1031   BILITOT <0.2* 05/06/2013 0619   GFRNONAA 52* 05/06/2013 0619   GFRAA 61* 05/06/2013 0619    No results found for this basename: SPEP, UPEP,  kappa and lambda light chains    Lab Results  Component Value Date   WBC 4.7 11/06/2013   NEUTROABS 2.9 11/06/2013   HGB 11.0* 11/06/2013   HCT 33.6* 11/06/2013   MCV 92.7 11/06/2013   PLT 244 11/06/2013      Chemistry      Component Value Date/Time   NA 141 11/06/2013 1031   NA 141 05/06/2013 0619   K 4.3 11/06/2013 1031   K 4.0 05/06/2013 0619   CL 106 05/06/2013 0619   CO2 21* 11/06/2013 1031   CO2 20 05/06/2013 0619   BUN 16.6 11/06/2013 1031   BUN 14 05/06/2013 0619    CREATININE 1.4* 11/06/2013 1031   CREATININE 1.02 05/06/2013 0619      Component Value Date/Time   CALCIUM 9.4 11/06/2013 1031   CALCIUM 9.5 05/06/2013 0619   ALKPHOS 66 11/06/2013 1031   ALKPHOS 70 05/06/2013 0619   AST 24 11/06/2013 1031   AST 24 05/06/2013 0619   ALT 22 11/06/2013 1031   ALT 17 05/06/2013 0619   BILITOT 0.22 11/06/2013 1031   BILITOT <0.2* 05/06/2013 0619     ASSESSMENT & PLAN:  Non-small cell lung cancer The patient has completed all the treatment. Clinically, she is improving since discontinuation of chemotherapy. I plan to repeat imaging study at the end of the year and if that is  negative, we will get her port removed.    Anemia in neoplastic disease This is improving since discontinuation of chemotherapy. I suspect there may be a component of anemia chronic disease and chronic kidney disease. She is not symptomatic. Recommend observation only.  Incisional pain She declined gabapentin due to fear for side effects. She will continue taking Tylenol as needed.  Essential hypertension, benign I am concerned about this. I recommend the addition of amlodipine and recheck blood pressure with PCP.  Chronic kidney disease (CKD), stage II (mild) This is stable. I will add amlodipine to get better blood pressure control.   Orders Placed This Encounter  Procedures  . CT Chest W Contrast    Standing Status: Future     Number of Occurrences:      Standing Expiration Date: 01/06/2015    Order Specific Question:  Reason for Exam (SYMPTOM  OR DIAGNOSIS REQUIRED)    Answer:  lung ca s/p resection, liver lesion, exclude recurrence    Order Specific Question:  Preferred imaging location?    Answer:  [facility]   All questions were answered. The patient knows to call the clinic with any problems, questions or concerns. No barriers to learning was detected. I spent 25 minutes counseling the patient face to face. The total time spent in the appointment was 30  minutes and more than 50% was on counseling and review of test results     [facility], [place], MD 11/06/2013 2:55 PM

## 2013-11-06 NOTE — Assessment & Plan Note (Signed)
This is stable. I will add amlodipine to get better blood pressure control.

## 2013-11-06 NOTE — Assessment & Plan Note (Signed)
I am concerned about this. I recommend the addition of amlodipine and recheck blood pressure with PCP.

## 2013-11-24 ENCOUNTER — Emergency Department (HOSPITAL_COMMUNITY): Payer: Medicare Other

## 2013-11-24 ENCOUNTER — Emergency Department (HOSPITAL_COMMUNITY)
Admission: EM | Admit: 2013-11-24 | Discharge: 2013-11-24 | Disposition: A | Discharge status: Home / Self Care | Payer: Medicare Other | Attending: Emergency Medicine | Admitting: Emergency Medicine

## 2013-11-24 ENCOUNTER — Encounter (HOSPITAL_COMMUNITY): Payer: Self-pay | Admitting: Emergency Medicine

## 2013-11-24 DIAGNOSIS — Z79899 Other long term (current) drug therapy: Secondary | ICD-10-CM | POA: Diagnosis not present

## 2013-11-24 DIAGNOSIS — M79641 Pain in right hand: Secondary | ICD-10-CM | POA: Diagnosis present

## 2013-11-24 DIAGNOSIS — M25531 Pain in right wrist: Secondary | ICD-10-CM | POA: Diagnosis not present

## 2013-11-24 DIAGNOSIS — Z87891 Personal history of nicotine dependence: Secondary | ICD-10-CM | POA: Insufficient documentation

## 2013-11-24 DIAGNOSIS — I129 Hypertensive chronic kidney disease with stage 1 through stage 4 chronic kidney disease, or unspecified chronic kidney disease: Secondary | ICD-10-CM | POA: Diagnosis not present

## 2013-11-24 DIAGNOSIS — Z8669 Personal history of other diseases of the nervous system and sense organs: Secondary | ICD-10-CM | POA: Insufficient documentation

## 2013-11-24 DIAGNOSIS — N183 Chronic kidney disease, stage 3 (moderate): Secondary | ICD-10-CM | POA: Diagnosis not present

## 2013-11-24 DIAGNOSIS — Z85118 Personal history of other malignant neoplasm of bronchus and lung: Secondary | ICD-10-CM | POA: Diagnosis not present

## 2013-11-24 DIAGNOSIS — R52 Pain, unspecified: Secondary | ICD-10-CM

## 2013-11-24 DIAGNOSIS — E0590 Thyrotoxicosis, unspecified without thyrotoxic crisis or storm: Secondary | ICD-10-CM | POA: Insufficient documentation

## 2013-11-24 DIAGNOSIS — M19031 Primary osteoarthritis, right wrist: Secondary | ICD-10-CM | POA: Diagnosis not present

## 2013-11-24 DIAGNOSIS — M19021 Primary osteoarthritis, right elbow: Secondary | ICD-10-CM | POA: Diagnosis not present

## 2013-11-24 DIAGNOSIS — Z7902 Long term (current) use of antithrombotics/antiplatelets: Secondary | ICD-10-CM | POA: Insufficient documentation

## 2013-11-24 DIAGNOSIS — I10 Essential (primary) hypertension: Secondary | ICD-10-CM | POA: Diagnosis not present

## 2013-11-24 DIAGNOSIS — M1990 Unspecified osteoarthritis, unspecified site: Secondary | ICD-10-CM | POA: Insufficient documentation

## 2013-11-24 DIAGNOSIS — Z862 Personal history of diseases of the blood and blood-forming organs and certain disorders involving the immune mechanism: Secondary | ICD-10-CM | POA: Diagnosis not present

## 2013-11-24 DIAGNOSIS — E559 Vitamin D deficiency, unspecified: Secondary | ICD-10-CM | POA: Insufficient documentation

## 2013-11-24 DIAGNOSIS — J45909 Unspecified asthma, uncomplicated: Secondary | ICD-10-CM | POA: Insufficient documentation

## 2013-11-24 MED ORDER — HYDROCODONE-ACETAMINOPHEN 5-325 MG PO TABS: 1.0000 | ORAL_TABLET | ORAL | Status: DC | PRN

## 2013-11-24 NOTE — ED Notes (Signed)
Pt reports right arm and hand swelling and weakness x1 week.  Pt is unable to lift objects with hand and is tender to the touch.  Pt denies any numbness or tingling.  Pt family reports she got stung by a bee a month ago.

## 2013-11-24 NOTE — ED Notes (Signed)
Pt c/o right arm and hand pain x 1 week with some weakness due to pain and swelling

## 2013-11-24 NOTE — ED Provider Notes (Signed)
CSN: [id number]     Arrival date & time 11/24/13  1155 History   First MD Initiated Contact with Patient 11/24/13 1441     Chief Complaint  Patient presents with  . Hand Pain     (Consider location/radiation/quality/duration/timing/severity/associated sxs/prior Treatment) The history is provided by the patient.     Pt with hx Lawson lung cancer, s/p chemotherapy and surgical excision, HTN, CKD p/w right arm pain.  States she had a painful knot on her right elbow for several months.  Three days ago the pain suddenly involved her right forearm, wrist and ulnar aspect of her right hand.  She is right handed.  Denies any injury or fall.  Denies overuse or repetitive activities with that hand.  Does have mild chronic neck pain and has had c-spine surgery remotely.  Denies fevers, weakness or numbness of the hand.  Denies any other joint pain.   Took oxycodone but was itching and didn't like taking it.   Past Medical History  Diagnosis Date  . Neurologic disorder     2009  . Unspecified vitamin D deficiency   . Arthritis   . Thrombocytopenia, unspecified 02/26/2013  . Cough 02/26/2013  . Lung mass 02/27/2013  . Hypertension   . Asthma   . Shortness of breath     exertion  . Hyperthyroidism     took iodine treatment for this  . Benign hypertensive kidney disease with chronic kidney disease stage I through stage IV, or unspecified     sees dr. Justin Mend again in april  . Chronic kidney disease, stage III (moderate)   . Cancer     lung ca dx'd 02/2013   Past Surgical History  Procedure Laterality Date  . Back surgery    . Neck surgery    . Eye surgery      cataract  . Eye surgery Left     open up tear duct  . Video bronchoscopy N/A 04/07/2013    Procedure: VIDEO BRONCHOSCOPY;  Surgeon: [name], MD;  Location: [facility] OR;  Service: Thoracic;  Laterality: N/A;  . Video assisted thoracoscopy (vats)/ lobectomy Left 04/07/2013    Procedure: VIDEO ASSISTED THORACOSCOPY (VATS)/ LOBECTOMY;  INSERTION OF ON-Q PUMP;  Surgeon: [name], MD;  Location: [facility];  Service: Thoracic;  Laterality: Left;  . Lymph node dissection Left 04/07/2013    Procedure: LYMPH NODE DISSECTION;  Surgeon: [name], MD;  Location: [facility];  Service: Thoracic;  Laterality: Left;  . Portacath placement Left 05/06/2013    Procedure: INSERTION PORT-A-CATH;  Surgeon: [name], MD;  Location: [facility] OR;  Service: Thoracic;  Laterality: Left;   Family History  Problem Relation Age of Onset  . Lung cancer Brother     smoked  . Breast cancer Mother   . Lung cancer Father     smoked  . Rectal cancer Neg Hx   . Stomach cancer Neg Hx   . Esophageal cancer Neg Hx   . Colon cancer Neg Hx    History  Substance Use Topics  . Smoking status: Former Smoker -- 0.50 packs/day for 60 years    Types: Cigarettes    Quit date: 02/25/2012  . Smokeless tobacco: Never Used  . Alcohol Use: No   OB History    No data available     Review of Systems  All other systems reviewed and are negative.     Allergies  Aspirin; Lactose intolerance (gi); Lisinopril; and Adhesive  Home  Medications   Prior to Admission medications   Medication Sig Start Date End Date Taking? Authorizing Provider  amLODipine (NORVASC) 10 MG tablet Take 1 tablet (10 mg total) by mouth daily. 11/06/13   Heath Lark, MD  Cholecalciferol (VITAMIN D-3) 1000 UNITS CAPS Take 1 capsule by mouth daily.    Historical Provider, MD  clopidogrel (PLAVIX) 75 MG tablet Take 75 mg by mouth daily with breakfast.  08/28/12   Historical Provider, MD  lidocaine-prilocaine (EMLA) cream Apply 1 application topically as needed. 05/13/13   Heath Lark, MD  magnesium oxide (MAG-OX) 400 MG tablet Take 400 mg by mouth daily.    Historical Provider, MD  metoprolol succinate (TOPROL-XL) 25 MG 24 hr tablet Take 25 mg by mouth daily.  09/30/12   Historical Provider, MD  Multiple Vitamin (MULTIVITAMIN) tablet Take 1 tablet by mouth daily.    Historical  Provider, MD  oxyCODONE-acetaminophen (PERCOCET/ROXICET) 5-325 MG per tablet Take 1 tablet by mouth every 6 (six) hours as needed for severe pain. 07/23/13   Heath Lark, MD  prochlorperazine (COMPAZINE) 10 MG tablet TAKE 1 TABLET BY MOUTH EVERY 6 HOURS AS NEEDED FOR NAUSEA OR VOMITING 07/09/13   Heath Lark, MD  tobramycin (TOBREX) 0.3 % ophthalmic solution PLACE 1 DROP INTO THE RIGHT EYE EVERY 4 HOURS 06/27/13   Heath Lark, MD  travoprost, benzalkonium, (TRAVATAN) 0.004 % ophthalmic solution Place 1 drop into both eyes at bedtime.    Historical Provider, MD  valsartan (DIOVAN) 80 MG tablet Take 1 tablet (80 mg total) by mouth daily. 03/05/13   Tanda Rockers, MD   BP 106/64 mmHg  Pulse 57  Temp(Src) 97.8 F (36.6 C) (Oral)  Resp 18  SpO2 98% Physical Exam  Constitutional: She appears well-developed and well-nourished. No distress.  HENT:  Head: Normocephalic and atraumatic.  Neck: Neck supple.  Pulmonary/Chest: Effort normal.  Musculoskeletal:       Right elbow: Normal.      Right wrist: She exhibits tenderness, bony tenderness and swelling. She exhibits no crepitus, no deformity and no laceration.       Right forearm: She exhibits no tenderness, no bony tenderness, no swelling, no edema and no deformity.       Right hand: She exhibits swelling. She exhibits normal range of motion, no tenderness, no bony tenderness, normal capillary refill, no deformity and no laceration. Normal sensation noted. Normal strength noted.  Neurological: She is alert.  Skin: She is not diaphoretic.  Nursing note and vitals reviewed.   ED Course  Procedures (including critical care time) Labs Review Labs Reviewed - No data to display  Imaging Review Dg Elbow Complete Right  11/24/2013   CLINICAL DATA:  Right arm swelling in weakness for 1 week without history of injury  EXAM: RIGHT ELBOW - COMPLETE 3+ VIEW  COMPARISON:  None.  FINDINGS: The bones of the elbow are adequately mineralized. There are small spurs  from the olecranon and from the coronoid process. The radial head is intact. The condylar and supracondylar regions of the distal humerus are normal. There is no joint effusion.  IMPRESSION: There are mild degenerative changes of the right elbow. There is no acute fracture nor dislocation.   Electronically Signed   By: David  Martinique   On: 11/24/2013 16:10   Dg Wrist Complete Right  11/24/2013   CLINICAL DATA:  Right hand swelling and weakness for 1 week with difficulty lifting objects.  EXAM: RIGHT WRIST - COMPLETE 3+ VIEW  COMPARISON:  None.  FINDINGS: The bones of the wrist are reasonably well mineralized for age. There is degenerative change of the first and second carpometacarpal joints. The intercarpal joints are unremarkable. There is mild degenerative change of the radiocarpal joint.  IMPRESSION: There are degenerative changes involving present lead the first and second carpometacarpal joints and the radiocarpal joint. There is no acute fracture nor dislocation.   Electronically Signed   By: David  Martinique   On: 11/24/2013 16:11     EKG Interpretation None       3:26 PM Dr Mingo Amber made aware of the patient.   MDM   Final diagnoses:  Pain  Right wrist pain    Afebrile, nontoxic patient with right forearm,wrist pain without injury.  Neurovascularly intact.  Xrays significant only for degenerative changes in the carpometacarpal and radiocarpal joints, mild degenerative changes in elbow.  No clinical e/o gout or septic joint.  Discussed with Dr Mingo Amber who has also seen the patient.    D/C home with velcro wrist splint and pain medication. PCP, hand surgery (Dr Burney Gauze) follow up.  Discussed result, findings, treatment, and follow up  with patient.  Pt given return precautions.  Pt verbalizes understanding and agrees with plan.        Clayton Bibles, PA-C 11/24/13 1656

## 2013-11-24 NOTE — Discharge Instructions (Signed)
Read the information below.  Use the prescribed medication as directed.  Please discuss all new medications with your pharmacist.  Do not take additional tylenol while taking the prescribed pain medication to avoid overdose.  You may return to the Emergency Department at any time for worsening condition or any new symptoms that concern you.  If you develop uncontrolled pain, weakness or numbness of the extremity, severe discoloration of the skin, or you are unable to move your arm, return to the ER for a recheck.      Wrist Pain Wrist injuries are frequent in adults and children. A sprain is an injury to the ligaments that hold your bones together. A strain is an injury to muscle or muscle cord-like structures (tendons) from stretching or pulling. Generally, when wrists are moderately tender to touch following a fall or injury, a break in the bone (fracture) may be present. Most wrist sprains or strains are better in 3 to 5 days, but complete healing may take several weeks. HOME CARE INSTRUCTIONS   Put ice on the injured area.  Put ice in a plastic bag.  Place a towel between your skin and the bag.  Leave the ice on for 15-20 minutes, 3-4 times a day, for the first 2 days, or as directed by your health care provider.  Keep your arm raised above the level of your heart whenever possible to reduce swelling and pain.  Rest the injured area for at least 48 hours or as directed by your health care provider.  If a splint or elastic bandage has been applied, use it for as long as directed by your health care provider or until seen by a health care provider for a follow-up exam.  Only take over-the-counter or prescription medicines for pain, discomfort, or fever as directed by your health care provider.  Keep all follow-up appointments. You may need to follow up with a specialist or have follow-up X-rays. Improvement in pain level is not a guarantee that you did not fracture a bone in your wrist. The  only way to determine whether or not you have a broken bone is by X-ray. SEEK IMMEDIATE MEDICAL CARE IF:   Your fingers are swollen, very red, white, or cold and blue.  Your fingers are numb or tingling.  You have increasing pain.  You have difficulty moving your fingers. MAKE SURE YOU:   Understand these instructions.  Will watch your condition.  Will get help right away if you are not doing well or get worse. Document Released: 10/19/2004 Document Revised: 01/14/2013 Document Reviewed: 03/02/2010 [facility] Patient Information 2015 [place], [place]. This information is not intended to replace advice given to you by your health care provider. Make sure you discuss any questions you have with your health care provider.

## 2013-12-31 ENCOUNTER — Ambulatory Visit (HOSPITAL_BASED_OUTPATIENT_CLINIC_OR_DEPARTMENT_OTHER): Payer: Medicare Other

## 2013-12-31 ENCOUNTER — Ambulatory Visit (HOSPITAL_COMMUNITY)
Admission: RE | Admit: 2013-12-31 | Discharge: 2013-12-31 | Disposition: A | Discharge status: Home / Self Care | Payer: Medicare Other | Source: Ambulatory Visit | Attending: Hematology and Oncology | Admitting: Hematology and Oncology

## 2013-12-31 ENCOUNTER — Other Ambulatory Visit (HOSPITAL_BASED_OUTPATIENT_CLINIC_OR_DEPARTMENT_OTHER): Payer: Medicare Other

## 2013-12-31 DIAGNOSIS — J439 Emphysema, unspecified: Secondary | ICD-10-CM | POA: Diagnosis not present

## 2013-12-31 DIAGNOSIS — C3492 Malignant neoplasm of unspecified part of left bronchus or lung: Secondary | ICD-10-CM

## 2013-12-31 DIAGNOSIS — Z95828 Presence of other vascular implants and grafts: Secondary | ICD-10-CM

## 2013-12-31 DIAGNOSIS — C3490 Malignant neoplasm of unspecified part of unspecified bronchus or lung: Secondary | ICD-10-CM

## 2013-12-31 DIAGNOSIS — Z452 Encounter for adjustment and management of vascular access device: Secondary | ICD-10-CM | POA: Diagnosis not present

## 2013-12-31 DIAGNOSIS — R0602 Shortness of breath: Secondary | ICD-10-CM | POA: Insufficient documentation

## 2013-12-31 DIAGNOSIS — J984 Other disorders of lung: Secondary | ICD-10-CM | POA: Diagnosis not present

## 2013-12-31 LAB — CBC WITH DIFFERENTIAL/PLATELET
BASO%: 0.4 % (ref 0.0–2.0)
Basophils Absolute: 0 10*3/uL (ref 0.0–0.1)
EOS ABS: 0.1 10*3/uL (ref 0.0–0.5)
EOS%: 2.2 % (ref 0.0–7.0)
HCT: 32.6 % — ABNORMAL LOW (ref 34.8–46.6)
HGB: 10.8 g/dL — ABNORMAL LOW (ref 11.6–15.9)
LYMPH#: 1.3 10*3/uL (ref 0.9–3.3)
LYMPH%: 28.3 % (ref 14.0–49.7)
MCH: 29.7 pg (ref 25.1–34.0)
MCHC: 33.1 g/dL (ref 31.5–36.0)
MCV: 89.6 fL (ref 79.5–101.0)
MONO#: 0.6 10*3/uL (ref 0.1–0.9)
MONO%: 12.1 % (ref 0.0–14.0)
NEUT%: 57 % (ref 38.4–76.8)
NEUTROS ABS: 2.6 10*3/uL (ref 1.5–6.5)
PLATELETS: 251 10*3/uL (ref 145–400)
RBC: 3.64 10*6/uL — AB (ref 3.70–5.45)
RDW: 15.8 % — ABNORMAL HIGH (ref 11.2–14.5)
WBC: 4.5 10*3/uL (ref 3.9–10.3)

## 2013-12-31 LAB — COMPREHENSIVE METABOLIC PANEL (CC13)
ALT: 23 U/L (ref 0–55)
ANION GAP: 10 meq/L (ref 3–11)
AST: 27 U/L (ref 5–34)
Albumin: 3.9 g/dL (ref 3.5–5.0)
Alkaline Phosphatase: 73 U/L (ref 40–150)
BILIRUBIN TOTAL: 0.31 mg/dL (ref 0.20–1.20)
BUN: 19.7 mg/dL (ref 7.0–26.0)
CALCIUM: 10.1 mg/dL (ref 8.4–10.4)
CHLORIDE: 110 meq/L — AB (ref 98–109)
CO2: 23 meq/L (ref 22–29)
Creatinine: 1.4 mg/dL — ABNORMAL HIGH (ref 0.6–1.1)
EGFR: 41 mL/min/{1.73_m2} — AB (ref >90)
GLUCOSE: 89 mg/dL (ref 70–140)
Potassium: 4.3 mEq/L (ref 3.5–5.1)
Sodium: 143 mEq/L (ref 136–145)
TOTAL PROTEIN: 6.8 g/dL (ref 6.4–8.3)

## 2013-12-31 MED ORDER — SODIUM CHLORIDE 0.9 % IJ SOLN
10.0000 mL | INTRAMUSCULAR | Status: DC | PRN
  Administered 2013-12-31: 10 mL via INTRAVENOUS
  Filled 2013-12-31: qty 10

## 2013-12-31 MED ORDER — IOHEXOL 300 MG/ML  SOLN
80.0000 mL | Freq: Once | INTRAMUSCULAR | Status: AC | PRN
  Administered 2013-12-31: 80 mL via INTRAVENOUS

## 2013-12-31 MED ORDER — HEPARIN SOD (PORK) LOCK FLUSH 100 UNIT/ML IV SOLN
500.0000 [IU] | Freq: Once | INTRAVENOUS | Status: DC
  Filled 2013-12-31: qty 5

## 2013-12-31 NOTE — Patient Instructions (Signed)

## 2014-01-01 ENCOUNTER — Other Ambulatory Visit: Payer: Self-pay | Admitting: *Deleted

## 2014-01-01 ENCOUNTER — Telehealth: Payer: Self-pay | Admitting: *Deleted

## 2014-01-01 ENCOUNTER — Encounter: Payer: Self-pay | Admitting: Hematology and Oncology

## 2014-01-01 ENCOUNTER — Ambulatory Visit (HOSPITAL_BASED_OUTPATIENT_CLINIC_OR_DEPARTMENT_OTHER): Payer: Medicare Other | Admitting: Hematology and Oncology

## 2014-01-01 ENCOUNTER — Telehealth: Payer: Self-pay | Admitting: Hematology and Oncology

## 2014-01-01 VITALS — BP 145/64 | HR 67 | Temp 98.1°F | Resp 18 | Ht 60.0 in | Wt 162.3 lb

## 2014-01-01 DIAGNOSIS — D630 Anemia in neoplastic disease: Secondary | ICD-10-CM

## 2014-01-01 DIAGNOSIS — C3490 Malignant neoplasm of unspecified part of unspecified bronchus or lung: Secondary | ICD-10-CM

## 2014-01-01 DIAGNOSIS — I10 Essential (primary) hypertension: Secondary | ICD-10-CM

## 2014-01-01 DIAGNOSIS — N182 Chronic kidney disease, stage 2 (mild): Secondary | ICD-10-CM

## 2014-01-01 DIAGNOSIS — C3432 Malignant neoplasm of lower lobe, left bronchus or lung: Secondary | ICD-10-CM | POA: Diagnosis not present

## 2014-01-01 NOTE — Progress Notes (Signed)
[facility] OFFICE PROGRESS NOTE  Patient Care Team: Glendale Chard, MD as PCP - General (Internal Medicine) Heath Lark, MD as Consulting Physician (Hematology and Oncology) [name], MD as Consulting Physician (Cardiothoracic Surgery)  SUMMARY OF ONCOLOGIC HISTORY: Oncology History   Non-small cell lung cancer, adenocarcinoma   Primary site: Lung (Left)   Staging method: AJCC 7th Edition   Clinical: Stage IB (T2a, N0, M0) signed by Heath Lark, MD on 04/29/2013  9:01 PM   Pathologic: Stage IB (T2a, N0, cM0) signed by [name], MD on 04/09/2013 12:51 PM   Summary: Stage IB (T2a, N0, cM0)       Non-small cell lung cancer   02/26/2013 Imaging CXR for evaluation of cough showed new left lung mass   02/28/2013 Imaging CT chest showed left new lung nodule   03/14/2013 Imaging PET/CT scan showed localized disease in the left lung   04/07/2013 Surgery She underwent bronchoscopy and left lower lobe resection and lymph node sampling with negative margins   05/06/2013 Procedure The patient has placement of Infuse-a-Port   05/21/2013 - 07/30/2013 Chemotherapy She completed 4 cycles of adjuvant chemotherapy with navelbine and cisplatin   07/01/2013 Imaging CT scan of the chest shows stable appearance.   07/02/2013 Adverse Reaction Dose of chemotherapy was adjusted further due to severe anemia.   12/31/2013 Imaging CT scan of the chest show no evidence of disease recurrence    INTERVAL HISTORY: Please see below for problem oriented charting. He is feeling well. Denies any cough. She complained of mild dizziness with the addition of amlodipine and has discontinued taking it for week. 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  REVIEW OF SYSTEMS:   Constitutional: Denies fevers, chills or abnormal weight loss Eyes: Denies blurriness of vision Ears, nose, mouth, throat, and face: Denies mucositis or sore throat Respiratory: Denies cough, dyspnea or  wheezes Cardiovascular: Denies palpitation, chest discomfort or lower extremity swelling Gastrointestinal:  Denies nausea, heartburn or change in bowel habits Skin: Denies abnormal skin rashes Lymphatics: Denies new lymphadenopathy or easy bruising Neurological:Denies numbness, tingling or new weaknesses Behavioral/Psych: Mood is stable, no new changes  All other systems were reviewed with the patient and are negative.  I have reviewed the past medical history, past surgical history, social history and family history with the patient and they are unchanged from previous note.  ALLERGIES:  is allergic to aspirin; lactose intolerance (gi); adhesive; lisinopril; and percocet.  MEDICATIONS:  Current Outpatient Prescriptions  Medication Sig Dispense Refill  . amLODipine (NORVASC) 10 MG tablet Take 1 tablet (10 mg total) by mouth daily. 90 tablet 1  . atorvastatin (LIPITOR) 10 MG tablet Take 10 mg by mouth every evening.  3  . Cholecalciferol (VITAMIN D-3) 1000 UNITS CAPS Take 1 capsule by mouth daily.    . clopidogrel (PLAVIX) 75 MG tablet Take 75 mg by mouth daily with breakfast.     . ferrous sulfate 325 (65 FE) MG tablet Take 1 tablet by mouth daily.  5  . lidocaine-prilocaine (EMLA) cream Apply 1 application topically as needed. 30 g 0  . lidocaine-prilocaine (EMLA) cream Apply 1 application topically as needed (for port).     . magnesium oxide (MAG-OX) 400 MG tablet Take 400 mg by mouth daily.    . metoprolol succinate (TOPROL-XL) 25 MG 24 hr tablet Take 25 mg by mouth daily.     . Multiple Vitamin (MULTIVITAMIN) tablet Take 1 tablet by mouth daily.    Marland Kitchen tobramycin (  TOBREX) 0.3 % ophthalmic solution PLACE 1 DROP INTO THE RIGHT EYE EVERY 4 HOURS 5 mL 0  . travoprost, benzalkonium, (TRAVATAN) 0.004 % ophthalmic solution Place 1 drop into both eyes at bedtime.    . valsartan (DIOVAN) 80 MG tablet Take 1 tablet (80 mg total) by mouth daily.     No current facility-administered medications  for this visit.    PHYSICAL EXAMINATION: ECOG PERFORMANCE STATUS: 0 - Asymptomatic  Filed Vitals:   01/01/14 1144  BP: 145/64  Pulse: 67  Temp: 98.1 F (36.7 C)  Resp: 18   Filed Weights   01/01/14 1144  Weight: 162 lb 4.8 oz (73.619 kg)    GENERAL:alert, no distress and comfortable SKIN: skin color, texture, turgor are normal, no rashes or significant lesions EYES: normal, Conjunctiva are pink and non-injected, sclera clear OROPHARYNX:no exudate, no erythema and lips, buccal mucosa, and tongue normal  NECK: supple, thyroid normal size, non-tender, without nodularity LYMPH:  no palpable lymphadenopathy in the cervical, axillary or inguinal LUNGS: clear to auscultation and percussion with normal breathing effort HEART: regular rate & rhythm and no murmurs and no lower extremity edema ABDOMEN:abdomen soft, non-tender and normal bowel sounds Musculoskeletal:no cyanosis of digits and no clubbing  NEURO: alert & oriented x 3 with fluent speech, no focal motor/sensory deficits  LABORATORY DATA:  I have reviewed the data as listed    Component Value Date/Time   NA 143 12/31/2013 1038   NA 141 05/06/2013 0619   K 4.3 12/31/2013 1038   K 4.0 05/06/2013 0619   CL 106 05/06/2013 0619   CO2 23 12/31/2013 1038   CO2 20 05/06/2013 0619   GLUCOSE 89 12/31/2013 1038   GLUCOSE 97 05/06/2013 0619   BUN 19.7 12/31/2013 1038   BUN 14 05/06/2013 0619   CREATININE 1.4* 12/31/2013 1038   CREATININE 1.02 05/06/2013 0619   CALCIUM 10.1 12/31/2013 1038   CALCIUM 9.5 05/06/2013 0619   PROT 6.8 12/31/2013 1038   PROT 6.5 05/06/2013 0619   ALBUMIN 3.9 12/31/2013 1038   ALBUMIN 3.5 05/06/2013 0619   AST 27 12/31/2013 1038   AST 24 05/06/2013 0619   ALT 23 12/31/2013 1038   ALT 17 05/06/2013 0619   ALKPHOS 73 12/31/2013 1038   ALKPHOS 70 05/06/2013 0619   BILITOT 0.31 12/31/2013 1038   BILITOT <0.2* 05/06/2013 0619   GFRNONAA 52* 05/06/2013 0619   GFRAA 61* 05/06/2013 0619    No  results found for: SPEP, UPEP  Lab Results  Component Value Date   WBC 4.5 12/31/2013   NEUTROABS 2.6 12/31/2013   HGB 10.8* 12/31/2013   HCT 32.6* 12/31/2013   MCV 89.6 12/31/2013   PLT 251 12/31/2013      Chemistry      Component Value Date/Time   NA 143 12/31/2013 1038   NA 141 05/06/2013 0619   K 4.3 12/31/2013 1038   K 4.0 05/06/2013 0619   CL 106 05/06/2013 0619   CO2 23 12/31/2013 1038   CO2 20 05/06/2013 0619   BUN 19.7 12/31/2013 1038   BUN 14 05/06/2013 0619   CREATININE 1.4* 12/31/2013 1038   CREATININE 1.02 05/06/2013 0619      Component Value Date/Time   CALCIUM 10.1 12/31/2013 1038   CALCIUM 9.5 05/06/2013 0619   ALKPHOS 73 12/31/2013 1038   ALKPHOS 70 05/06/2013 0619   AST 27 12/31/2013 1038   AST 24 05/06/2013 0619   ALT 23 12/31/2013 1038   ALT 17 05/06/2013 7846  BILITOT 0.31 12/31/2013 1038   BILITOT <0.2* 05/06/2013 2130       RADIOGRAPHIC STUDIES: I have personally reviewed the radiological images as listed and agreed with the findings in the report. Ct Chest W Contrast  12/31/2013   CLINICAL DATA:  Lung cancer, completed chemotherapy. Shortness of breath.  EXAM: CT CHEST WITH CONTRAST  TECHNIQUE: Multidetector CT imaging of the chest was performed during intravenous contrast administration.  CONTRAST:  37mL OMNIPAQUE IOHEXOL 300 MG/ML  SOLN  COMPARISON:  07/01/2013  FINDINGS: 3.1 by 2.6 cm solid and partially calcified left thyroid nodule, stable.  Left subclavian Port-A-Cath tip: SVC.  Atherosclerotic aortic arch. Coronary artery atherosclerosis observed in the left anterior descending artery.  The trace left pleural effusion has nearly completely resolved. Focal pleural thickening or adjacent mild rounded atelectasis in the left lower lung is reduced in size and prominence on image 35 of series 2. This currently measures up to 6 mm in thickness.  No pathologic thoracic adenopathy.  Left lower lobectomy.  Indistinct 6 by 7 mm by 4 mm ground-glass  density in the left lung on image 32 of series 5 posteriorly in, stable.  Thoracic spondylosis.  Lower cervical plate and screw fixator.  Centrilobular emphysema.  Small focus of enhancement along the left side of the lateral segment left hepatic lobe, image 48 of series 2, stable, not previously hypermetabolic on prior PET-CT, probably flash filling of a hemangioma or a small perfusion anomaly.  IMPRESSION: 1. No findings of recurrence or significant change. 6 by 7 by 4 mm slightly sub solid left posterior nodule merits observation. 2. Stable left thyroid nodule. This was not previously hypermetabolic. 3. Stable small enhancing focus in the lateral segment left hepatic lobe, probably a flash filling hemangioma or perfusion anomaly, not previously hypermetabolic. 4. Centrilobular emphysema. 5. Left lower lobectomy. Focal scarring posteriorly in the remaining lower posterior left lung, with reduced size of the trace pleural effusion. 6. Atherosclerosis.   Electronically Signed   By: Sherryl Barters M.D.   On: 12/31/2013 15:39     ASSESSMENT & PLAN:  Non-small cell lung cancer She is doing well with no evidence of disease recurrence. I will refer her back to the CT surgeon for port removal. I will see her back in 6 months with repeat blood work, history, physical examination and chest x-ray.  Anemia in neoplastic disease This is improving since discontinuation of chemotherapy. I suspect there may be a component of anemia chronic disease and chronic kidney disease. She is not symptomatic. Recommend observation only.  Chronic kidney disease (CKD), stage II (mild) This is stable.    Essential hypertension, benign Her blood pressure is under good control but she could not tolerate the amlodipine. She felt better since discontinuation of amlodipine. I recommend close blood pressure monitoring and encouraged her to see primary care provider for medication adjustment in the future.      Orders  Placed This Encounter  Procedures  . DG Chest 2 View    Standing Status: Future     Number of Occurrences:      Standing Expiration Date: 03/03/2015    Order Specific Question:  Reason for exam:    Answer:  lung ca, exclude recurrence    Order Specific Question:  Preferred imaging location?    Answer:  [facility]  . CBC with Differential    Standing Status: Future     Number of Occurrences:      Standing Expiration Date: 02/05/2015  .  Comprehensive metabolic panel    Standing Status: Future     Number of Occurrences:      Standing Expiration Date: 02/05/2015  . Ambulatory referral to Cardiovascular Surgery    Referral Priority:  Routine    Referral Type:  Surgical    Referral Reason:  Specialty Services Required    Referred to Provider:  [name], MD    Requested Specialty:  Cardiothoracic Surgery    Number of Visits Requested:  1   All questions were answered. The patient knows to call the clinic with any problems, questions or concerns. No barriers to learning was detected. I spent 30 minutes counseling the patient face to face. The total time spent in the appointment was 40 minutes and more than 50% was on counseling and review of test results     [facility], [place], MD 01/01/2014 8:39 PM

## 2014-01-01 NOTE — Assessment & Plan Note (Signed)
Her blood pressure is under good control but she could not tolerate the amlodipine. She felt better since discontinuation of amlodipine. I recommend close blood pressure monitoring and encouraged her to see primary care provider for medication adjustment in the future.

## 2014-01-01 NOTE — Assessment & Plan Note (Signed)
She is doing well with no evidence of disease recurrence. I will refer her back to the CT surgeon for port removal. I will see her back in 6 months with repeat blood work, history, physical examination and chest x-ray.

## 2014-01-01 NOTE — Telephone Encounter (Signed)
Pt confirmed labs/ov per 12/10 POF, gave pt AVS..... KJ, lft msg for referral at Dr. Everrett Coombe office for port removal..Marland Kitchen

## 2014-01-01 NOTE — Assessment & Plan Note (Signed)
This is stable 

## 2014-01-01 NOTE — Assessment & Plan Note (Signed)
This is improving since discontinuation of chemotherapy. I suspect there may be a component of anemia chronic disease and chronic kidney disease. She is not symptomatic. Recommend observation only.

## 2014-01-01 NOTE — Telephone Encounter (Signed)
Patient is scheduled for porta-cath removal by Dr. Servando Snare on Friday 12/18.  Patient and family aware.  Told to call if further questions.

## 2014-01-06 NOTE — Pre-Procedure Instructions (Signed)
[name]  01/06/2014   Your procedure is scheduled on:  Friday, Dec. 18  Report to [facility] Main Entrance "A" at 5:30 AM.  Call this number if you have problems the morning of surgery: [phone]              If any questions prior to surgery call pre-admission [phone]   Remember:   Do not eat food or drink liquids after midnight.   Take these medicines the morning of surgery with A SIP OF WATER: amlodipine (norvasc), metorpolol(toprol XL),               Dr. Everrett Coombe office will call today and give you instructions about the plavix.   Do not wear jewelry, make-up or nail polish.  Do not wear lotions, powders, or perfumes. You may wear deodorant.  Do not shave 48 hours prior to surgery. Men may shave face and neck.  Do not bring valuables to the hospital.  [facility] is not responsible  for any belongings or valuables.               Contacts, dentures or bridgework may not be worn into surgery.  Leave suitcase in the car. After surgery it may be brought to your room.  For patients admitted to the hospital, discharge time is determined by your                treatment team.               Patients discharged the day of surgery will not be allowed to drive  home.  Name and phone number of your driver:   Special Instructions: review preparing for surgery handout   Please read over the following fact sheets that you were given: cough and deep breath, pain handout  Surgical site infections

## 2014-01-07 ENCOUNTER — Telehealth: Payer: Self-pay | Admitting: *Deleted

## 2014-01-07 ENCOUNTER — Encounter (HOSPITAL_COMMUNITY): Payer: Self-pay

## 2014-01-07 ENCOUNTER — Encounter (HOSPITAL_COMMUNITY)
Admission: RE | Admit: 2014-01-07 | Discharge: 2014-01-07 | Disposition: A | Discharge status: Home / Self Care | Payer: Medicare Other | Source: Ambulatory Visit | Attending: Cardiothoracic Surgery | Admitting: Cardiothoracic Surgery

## 2014-01-07 VITALS — BP 170/78 | HR 58 | Temp 98.3°F | Resp 18 | Ht 60.0 in | Wt 164.5 lb

## 2014-01-07 DIAGNOSIS — E041 Nontoxic single thyroid nodule: Secondary | ICD-10-CM | POA: Diagnosis not present

## 2014-01-07 DIAGNOSIS — C3490 Malignant neoplasm of unspecified part of unspecified bronchus or lung: Secondary | ICD-10-CM

## 2014-01-07 DIAGNOSIS — D1803 Hemangioma of intra-abdominal structures: Secondary | ICD-10-CM | POA: Diagnosis not present

## 2014-01-07 DIAGNOSIS — Z85118 Personal history of other malignant neoplasm of bronchus and lung: Secondary | ICD-10-CM | POA: Diagnosis not present

## 2014-01-07 DIAGNOSIS — D696 Thrombocytopenia, unspecified: Secondary | ICD-10-CM | POA: Diagnosis not present

## 2014-01-07 DIAGNOSIS — D509 Iron deficiency anemia, unspecified: Secondary | ICD-10-CM | POA: Diagnosis not present

## 2014-01-07 DIAGNOSIS — I129 Hypertensive chronic kidney disease with stage 1 through stage 4 chronic kidney disease, or unspecified chronic kidney disease: Secondary | ICD-10-CM | POA: Diagnosis not present

## 2014-01-07 DIAGNOSIS — Z87891 Personal history of nicotine dependence: Secondary | ICD-10-CM | POA: Diagnosis not present

## 2014-01-07 DIAGNOSIS — E0590 Thyrotoxicosis, unspecified without thyrotoxic crisis or storm: Secondary | ICD-10-CM | POA: Diagnosis not present

## 2014-01-07 DIAGNOSIS — R918 Other nonspecific abnormal finding of lung field: Secondary | ICD-10-CM | POA: Diagnosis present

## 2014-01-07 DIAGNOSIS — N183 Chronic kidney disease, stage 3 (moderate): Secondary | ICD-10-CM | POA: Diagnosis not present

## 2014-01-07 DIAGNOSIS — K219 Gastro-esophageal reflux disease without esophagitis: Secondary | ICD-10-CM | POA: Diagnosis not present

## 2014-01-07 HISTORY — DX: Headache, unspecified: R51.9

## 2014-01-07 HISTORY — DX: Headache: R51

## 2014-01-07 HISTORY — DX: Gastro-esophageal reflux disease without esophagitis: K21.9

## 2014-01-07 LAB — BASIC METABOLIC PANEL
Anion gap: 12 (ref 5–15)
BUN: 17 mg/dL (ref 6–23)
CALCIUM: 9.7 mg/dL (ref 8.4–10.5)
CO2: 21 mEq/L (ref 19–32)
CREATININE: 1.33 mg/dL — AB (ref 0.50–1.10)
Chloride: 105 mEq/L (ref 96–112)
GFR calc non Af Amer: 38 mL/min — ABNORMAL LOW (ref >90)
GFR, EST AFRICAN AMERICAN: 44 mL/min — AB (ref >90)
Glucose, Bld: 90 mg/dL (ref 70–99)
Potassium: 4.7 mEq/L (ref 3.7–5.3)
Sodium: 138 mEq/L (ref 137–147)

## 2014-01-07 LAB — CBC
HCT: 35 % — ABNORMAL LOW (ref 36.0–46.0)
Hemoglobin: 11.5 g/dL — ABNORMAL LOW (ref 12.0–15.0)
MCH: 29.8 pg (ref 26.0–34.0)
MCHC: 32.9 g/dL (ref 30.0–36.0)
MCV: 90.7 fL (ref 78.0–100.0)
PLATELETS: 254 10*3/uL (ref 150–400)
RBC: 3.86 MIL/uL — ABNORMAL LOW (ref 3.87–5.11)
RDW: 16.3 % — AB (ref 11.5–15.5)
WBC: 4.9 10*3/uL (ref 4.0–10.5)

## 2014-01-07 NOTE — Progress Notes (Signed)
Pt. States she saw Dr. Einar Gip only once in 2014  For the stress test and stated she didn't need to be followed anymore. Last visit with Dr. Melvyn Novas was prior to 3/15 and recommended pt. To Dr. Servando Snare.  Pt. Was not told to stop plavix. Notified Ryan at [employer] office. She stated she will contact him and follow up with the patient this afternoon

## 2014-01-07 NOTE — Telephone Encounter (Signed)
Patient will hold Plavix today, tomorrow and Friday.  She is aware.  Told her to call me if any other questions came up.  Dr. Servando Snare is aware.

## 2014-01-08 NOTE — Progress Notes (Signed)
Anesthesia chart review: Patient is a 78 year old female scheduled for left Port-a-cath removal on 01/09/14 by Dr. Servando Snare.   History includes left VATS s/p LL lobectomy for stage 1B [place] lung cancer 04/07/13 s/p chemotherapy, CKD stage III-IV, HTN, asthma, back and neck surgeries, former smoker, hyperthyroidism s/p radioactive iodine therapy '11, episode of "forgetting where she was" (?TIA) ~ 2007 that was treated with Plavix. PCP is Dr. Glendale Chard. She is not routinely followed by cardiology, but she saw Dr. Einar Gip in 2014 and had a negative exercise stress test. Per TCTS, Plavix on hold since 01/07/14 for surgery. Pulmonologist is Dr. Melvyn Novas. HEM-ONC is Dr. Alvy Bimler.  EKG on 04/03/13 showed SB with occasional PVC, septal infarct (age undetermined).  Exercise stress test on 03/28/2012 [facility] CV; scanned under Media tab) was negative for ischemia. There was no echo at [facility] CV.  Carotid duplex on 03/24/12 [facility] CV, under Media tab) showed no evidence of hemodynamically significant stenosis in the bilateral carotid bifurcation vessels. Mild bilateral intimal thickening.  Chest CT with contrast 12/31/13: IMPRESSION: 1. No findings of recurrence or significant change. 6 by 7 by 4 mm slightly sub solid left posterior nodule merits observation. 2. Stable left thyroid nodule. This was not previously hypermetabolic. 3. Stable small enhancing focus in the lateral segment left hepatic lobe, probably a flash filling hemangioma or perfusion anomaly, not previously hypermetabolic. 4. Centrilobular emphysema. 5. Left lower lobectomy. Focal scarring posteriorly in the remaining lower posterior left lung, with reduced size of the trace pleural effusion. 6. Atherosclerosis.  Preoperative labs noted.  Cr 1.33, stable. H/H 11.5/35.0.    She tolerated LL lobectomy earlier this year. Further evaluation by her assigned anesthesiologist on the day of surgery, but if no acute changes then anticipate she can  proceed as planned.  George Hugh [facility] Short Stay Center/Anesthesiology Phone [phone] 01/08/2014 9:49 AM

## 2014-01-09 ENCOUNTER — Ambulatory Visit (HOSPITAL_COMMUNITY): Payer: Medicare Other | Admitting: Certified Registered Nurse Anesthetist

## 2014-01-09 ENCOUNTER — Encounter (HOSPITAL_COMMUNITY): Payer: Self-pay | Admitting: *Deleted

## 2014-01-09 ENCOUNTER — Ambulatory Visit (HOSPITAL_COMMUNITY)
Admission: RE | Admit: 2014-01-09 | Discharge: 2014-01-09 | Disposition: A | Discharge status: Home / Self Care | Payer: Medicare Other | Source: Ambulatory Visit | Attending: Cardiothoracic Surgery | Admitting: Cardiothoracic Surgery

## 2014-01-09 ENCOUNTER — Encounter (HOSPITAL_COMMUNITY)
Admission: RE | Disposition: A | Discharge status: Home / Self Care | Payer: Self-pay | Source: Ambulatory Visit | Attending: Cardiothoracic Surgery

## 2014-01-09 ENCOUNTER — Ambulatory Visit (HOSPITAL_COMMUNITY): Payer: Medicare Other | Admitting: Vascular Surgery

## 2014-01-09 DIAGNOSIS — C3490 Malignant neoplasm of unspecified part of unspecified bronchus or lung: Secondary | ICD-10-CM | POA: Diagnosis not present

## 2014-01-09 DIAGNOSIS — K219 Gastro-esophageal reflux disease without esophagitis: Secondary | ICD-10-CM | POA: Insufficient documentation

## 2014-01-09 DIAGNOSIS — Z85110 Personal history of malignant carcinoid tumor of bronchus and lung: Secondary | ICD-10-CM | POA: Diagnosis not present

## 2014-01-09 DIAGNOSIS — N183 Chronic kidney disease, stage 3 (moderate): Secondary | ICD-10-CM | POA: Insufficient documentation

## 2014-01-09 DIAGNOSIS — D696 Thrombocytopenia, unspecified: Secondary | ICD-10-CM | POA: Diagnosis not present

## 2014-01-09 DIAGNOSIS — M1990 Unspecified osteoarthritis, unspecified site: Secondary | ICD-10-CM | POA: Diagnosis not present

## 2014-01-09 DIAGNOSIS — D1803 Hemangioma of intra-abdominal structures: Secondary | ICD-10-CM | POA: Diagnosis not present

## 2014-01-09 DIAGNOSIS — R0602 Shortness of breath: Secondary | ICD-10-CM | POA: Diagnosis not present

## 2014-01-09 DIAGNOSIS — I129 Hypertensive chronic kidney disease with stage 1 through stage 4 chronic kidney disease, or unspecified chronic kidney disease: Secondary | ICD-10-CM | POA: Insufficient documentation

## 2014-01-09 DIAGNOSIS — Z85118 Personal history of other malignant neoplasm of bronchus and lung: Secondary | ICD-10-CM | POA: Insufficient documentation

## 2014-01-09 DIAGNOSIS — Z87891 Personal history of nicotine dependence: Secondary | ICD-10-CM | POA: Insufficient documentation

## 2014-01-09 DIAGNOSIS — E041 Nontoxic single thyroid nodule: Secondary | ICD-10-CM | POA: Insufficient documentation

## 2014-01-09 DIAGNOSIS — C3432 Malignant neoplasm of lower lobe, left bronchus or lung: Secondary | ICD-10-CM | POA: Diagnosis not present

## 2014-01-09 DIAGNOSIS — D509 Iron deficiency anemia, unspecified: Secondary | ICD-10-CM | POA: Diagnosis not present

## 2014-01-09 DIAGNOSIS — E0590 Thyrotoxicosis, unspecified without thyrotoxic crisis or storm: Secondary | ICD-10-CM | POA: Insufficient documentation

## 2014-01-09 HISTORY — PX: PORT-A-CATH REMOVAL: SHX5289

## 2014-01-09 SURGERY — REMOVAL PORT-A-CATH
Anesthesia: Monitor Anesthesia Care | Site: Chest | Laterality: Left

## 2014-01-09 MED ORDER — FENTANYL CITRATE 0.05 MG/ML IJ SOLN: 25.0000 ug | INTRAMUSCULAR | Status: DC | PRN

## 2014-01-09 MED ORDER — EPHEDRINE SULFATE 50 MG/ML IJ SOLN
INTRAMUSCULAR | Status: AC
  Filled 2014-01-09: qty 1

## 2014-01-09 MED ORDER — PROPOFOL INFUSION 10 MG/ML OPTIME
INTRAVENOUS | Status: DC | PRN
  Administered 2014-01-09: 50 ug/kg/min via INTRAVENOUS

## 2014-01-09 MED ORDER — ONDANSETRON HCL 4 MG/2ML IJ SOLN
INTRAMUSCULAR | Status: DC | PRN
  Administered 2014-01-09: 4 mg via INTRAVENOUS

## 2014-01-09 MED ORDER — LIDOCAINE HCL (CARDIAC) 20 MG/ML IV SOLN
INTRAVENOUS | Status: AC
  Filled 2014-01-09: qty 5

## 2014-01-09 MED ORDER — PROPOFOL 10 MG/ML IV BOLUS
INTRAVENOUS | Status: AC
  Filled 2014-01-09: qty 20

## 2014-01-09 MED ORDER — MIDAZOLAM HCL 2 MG/2ML IJ SOLN
INTRAMUSCULAR | Status: AC
  Filled 2014-01-09: qty 2

## 2014-01-09 MED ORDER — LIDOCAINE-EPINEPHRINE (PF) 1 %-1:200000 IJ SOLN
INTRAMUSCULAR | Status: DC | PRN
  Administered 2014-01-09: 30 mL

## 2014-01-09 MED ORDER — ONDANSETRON HCL 4 MG/2ML IJ SOLN
INTRAMUSCULAR | Status: AC
  Filled 2014-01-09: qty 2

## 2014-01-09 MED ORDER — 0.9 % SODIUM CHLORIDE (POUR BTL) OPTIME
TOPICAL | Status: DC | PRN
  Administered 2014-01-09: 1000 mL

## 2014-01-09 MED ORDER — SUCCINYLCHOLINE CHLORIDE 20 MG/ML IJ SOLN
INTRAMUSCULAR | Status: AC
  Filled 2014-01-09: qty 1

## 2014-01-09 MED ORDER — LIDOCAINE HCL (CARDIAC) 20 MG/ML IV SOLN
INTRAVENOUS | Status: DC | PRN
  Administered 2014-01-09: 50 mg via INTRAVENOUS

## 2014-01-09 MED ORDER — FENTANYL CITRATE 0.05 MG/ML IJ SOLN
INTRAMUSCULAR | Status: DC | PRN
  Administered 2014-01-09: 50 ug via INTRAVENOUS

## 2014-01-09 MED ORDER — ROCURONIUM BROMIDE 50 MG/5ML IV SOLN
INTRAVENOUS | Status: AC
  Filled 2014-01-09: qty 1

## 2014-01-09 MED ORDER — LIDOCAINE-EPINEPHRINE (PF) 1 %-1:200000 IJ SOLN
INTRAMUSCULAR | Status: AC
  Filled 2014-01-09: qty 10

## 2014-01-09 MED ORDER — ONDANSETRON HCL 4 MG/2ML IJ SOLN: 4.0000 mg | Freq: Four times a day (QID) | INTRAMUSCULAR | Status: DC | PRN

## 2014-01-09 MED ORDER — LACTATED RINGERS IV SOLN
INTRAVENOUS | Status: DC | PRN
Start: 2014-01-09 — End: 2014-01-09
  Administered 2014-01-09: 07:00:00 via INTRAVENOUS

## 2014-01-09 MED ORDER — FENTANYL CITRATE 0.05 MG/ML IJ SOLN
INTRAMUSCULAR | Status: AC
  Filled 2014-01-09: qty 5

## 2014-01-09 MED ORDER — STERILE WATER FOR INJECTION IJ SOLN
INTRAMUSCULAR | Status: AC
  Filled 2014-01-09: qty 10

## 2014-01-09 SURGICAL SUPPLY — 33 items
ADH SKN CLS APL DERMABOND .7 (GAUZE/BANDAGES/DRESSINGS) ×1
ADH SKN CLS LQ APL DERMABOND (GAUZE/BANDAGES/DRESSINGS) ×1
BLADE SURG 11 STRL SS (BLADE) ×2 IMPLANT
CANISTER SUCTION 2500CC (MISCELLANEOUS) ×2 IMPLANT
COVER SURGICAL LIGHT HANDLE (MISCELLANEOUS) ×2 IMPLANT
DERMABOND ADHESIVE PROPEN (GAUZE/BANDAGES/DRESSINGS) ×1
DERMABOND ADVANCED (GAUZE/BANDAGES/DRESSINGS) ×1
DERMABOND ADVANCED .7 DNX12 (GAUZE/BANDAGES/DRESSINGS) ×1 IMPLANT
DERMABOND ADVANCED .7 DNX6 (GAUZE/BANDAGES/DRESSINGS) IMPLANT
DRAPE LAPAROTOMY T 102X78X121 (DRAPES) ×2 IMPLANT
ELECT REM PT RETURN 9FT ADLT (ELECTROSURGICAL) ×2
ELECTRODE REM PT RTRN 9FT ADLT (ELECTROSURGICAL) ×1 IMPLANT
GAUZE SPONGE 4X4 12PLY STRL (GAUZE/BANDAGES/DRESSINGS) ×1 IMPLANT
GLOVE BIO SURGEON STRL SZ 6.5 (GLOVE) ×4 IMPLANT
GLOVE BIOGEL PI IND STRL 7.0 (GLOVE) IMPLANT
GLOVE BIOGEL PI IND STRL 8 (GLOVE) IMPLANT
GLOVE BIOGEL PI INDICATOR 7.0 (GLOVE) ×1
GLOVE BIOGEL PI INDICATOR 8 (GLOVE) ×1
GLOVE ECLIPSE 7.0 STRL STRAW (GLOVE) ×1 IMPLANT
GOWN STRL REUS W/ TWL LRG LVL3 (GOWN DISPOSABLE) ×2 IMPLANT
GOWN STRL REUS W/TWL LRG LVL3 (GOWN DISPOSABLE) ×4
KIT BASIN OR (CUSTOM PROCEDURE TRAY) ×2 IMPLANT
KIT ROOM TURNOVER OR (KITS) ×2 IMPLANT
NEEDLE 22X1 1/2 (OR ONLY) (NEEDLE) ×2 IMPLANT
NS IRRIG 1000ML POUR BTL (IV SOLUTION) ×2 IMPLANT
PACK GENERAL/GYN (CUSTOM PROCEDURE TRAY) ×2 IMPLANT
PAD ARMBOARD 7.5X6 YLW CONV (MISCELLANEOUS) ×4 IMPLANT
SUT VIC AB 3-0 SH 8-18 (SUTURE) ×2 IMPLANT
SUT VICRYL 4-0 PS2 18IN ABS (SUTURE) ×3 IMPLANT
SYR CONTROL 10ML LL (SYRINGE) ×2 IMPLANT
TOWEL OR 17X24 6PK STRL BLUE (TOWEL DISPOSABLE) ×2 IMPLANT
TOWEL OR 17X26 10 PK STRL BLUE (TOWEL DISPOSABLE) ×2 IMPLANT
WATER STERILE IRR 1000ML POUR (IV SOLUTION) ×1 IMPLANT

## 2014-01-09 NOTE — Anesthesia Procedure Notes (Signed)
Procedure Name: MAC Date/Time: 01/09/2014 7:35 AM Performed by: Garrison Columbus T Pre-anesthesia Checklist: Patient identified, Emergency Drugs available, Suction available and Patient being monitored Patient Re-evaluated:Patient Re-evaluated prior to inductionOxygen Delivery Method: Simple face mask Preoxygenation: Pre-oxygenation with 100% oxygen Intubation Type: IV induction Placement Confirmation: positive ETCO2 and breath sounds checked- equal and bilateral Dental Injury: Teeth and Oropharynx as per pre-operative assessment

## 2014-01-09 NOTE — Anesthesia Preprocedure Evaluation (Addendum)
Anesthesia Evaluation  Patient identified by MRN, date of birth, ID band Patient awake    Reviewed: Allergy & Precautions, H&P , NPO status , Patient's Chart, lab work & pertinent test results, reviewed documented beta blocker date and time   Airway Mallampati: II  TM Distance: >3 FB Neck ROM: full    Dental  (+) Dental Advisory Given, Poor Dentition, Partial Upper, Partial Lower   Pulmonary shortness of breath and with exertion, former smoker,          Cardiovascular hypertension, Pt. on medications and Pt. on home beta blockers     Neuro/Psych  Headaches,    GI/Hepatic GERD-  Medicated,  Endo/Other  Hyperthyroidism Morbid obesity  Renal/GU Renal InsufficiencyRenal disease     Musculoskeletal  (+) Arthritis -,   Abdominal   Peds  Hematology   Anesthesia Other Findings   Reproductive/Obstetrics                           Anesthesia Physical Anesthesia Plan  ASA: III  Anesthesia Plan: MAC   Post-op Pain Management:    Induction: Intravenous  Airway Management Planned: Simple Face Mask and Natural Airway  Additional Equipment:   Intra-op Plan:   Post-operative Plan:   Informed Consent: I have reviewed the patients History and Physical, chart, labs and discussed the procedure including the risks, benefits and alternatives for the proposed anesthesia with the patient or authorized representative who has indicated his/her understanding and acceptance.   Dental advisory given  Plan Discussed with: CRNA, Anesthesiologist and Surgeon  Anesthesia Plan Comments:        Anesthesia Quick Evaluation

## 2014-01-09 NOTE — Anesthesia Postprocedure Evaluation (Signed)
Anesthesia Post Note  Patient: [name]  Procedure(s) Performed: Procedure(s) (LRB): REMOVAL PORT-A-CATH (Left)  Anesthesia type: MAC  Patient location: PACU  Post pain: Pain level controlled and Adequate analgesia  Post assessment: Post-op Vital signs reviewed, Patient's Cardiovascular Status Stable and Respiratory Function Stable  Last Vitals:  Filed Vitals:   01/09/14 0830  BP: 121/91  Pulse: 55  Temp:   Resp: 14    Post vital signs: Reviewed and stable  Level of consciousness: awake, alert  and oriented  Complications: No apparent anesthesia complications

## 2014-01-09 NOTE — Transfer of Care (Signed)
Immediate Anesthesia Transfer of Care Note  Patient: [name]  Procedure(s) Performed: Procedure(s): REMOVAL PORT-A-CATH (Left)  Patient Location: PACU  Anesthesia Type:MAC  Level of Consciousness: awake, alert  and oriented  Airway & Oxygen Therapy: Patient Spontanous Breathing  Post-op Assessment: Report given to PACU RN, Post -op Vital signs reviewed and stable and Patient moving all extremities X 4  Post vital signs: Reviewed and stable  Complications: No apparent anesthesia complications

## 2014-01-09 NOTE — Discharge Instructions (Signed)
What to eat:  For your first meals, you should eat lightly; only small meals initially.  If you do not have nausea, you may eat larger meals.  Avoid spicy, greasy and heavy food.    General Anesthesia, Adult, Care After  Refer to this sheet in the next few weeks. These instructions provide you with information on caring for yourself after your procedure. Your health care provider may also give you more specific instructions. Your treatment has been planned according to current medical practices, but problems sometimes occur. Call your health care provider if you have any problems or questions after your procedure.  WHAT TO EXPECT AFTER THE PROCEDURE  After the procedure, it is typical to experience:  Sleepiness.  Nausea and vomiting. HOME CARE INSTRUCTIONS  For the first 24 hours after general anesthesia:  Have a responsible person with you.  Do not drive a car. If you are alone, do not take public transportation.  Do not drink alcohol.  Do not take medicine that has not been prescribed by your health care provider.  Do not sign important papers or make important decisions.  You may resume a normal diet and activities as directed by your health care provider.  Change bandages (dressings) as directed.  If you have questions or problems that seem related to general anesthesia, call the hospital and ask for the anesthetist or anesthesiologist on call. SEEK MEDICAL CARE IF:  You have nausea and vomiting that continue the day after anesthesia.  You develop a rash. SEEK IMMEDIATE MEDICAL CARE IF:  You have difficulty breathing.  You have chest pain.  You have any allergic problems. Document Released: 04/17/2000 Document Revised: 09/11/2012 Document Reviewed: 07/25/2012  [facility] [employer] Patient Information 2014 [place], [place].   Tissue Adhesive Wound Care    Some cuts and wounds can be closed with tissue adhesive. Adhesive is like glue. It holds the skin together and helps a wound heal faster.  This adhesive goes away on its own as the wound heals.  HOME CARE  Showers are allowed. Do not soak the wound in water. Do not take baths, swim, or use hot tubs. Do not use soaps or creams on your wound.  If a bandage (dressing) was put on, change it as often as told by your doctor.  Keep the bandage dry.  Do not scratch, pick, or rub the adhesive.  Do not put tape over the adhesive. The adhesive could come off.  Protect the wound from another injury.  Protect the wound from sun and tanning beds.  Only take medicine as told by your doctor.  Keep all doctor visits as told. GET HELP RIGHT AWAY IF:  Your wound is red, puffy (swollen), hot, or tender.  You get a rash after the glue is put on.  You have more pain in the wound.  You have a red streak going away from the wound.  You have yellowish-white fluid (pus) coming from the wound.  You have more bleeding.  You have a fever.  You have chills and start to shake.  You notice a bad smell coming from the wound.  Your wound or adhesive breaks open. MAKE SURE YOU:  Understand these instructions.  Will watch your condition.  Will get help right away if you are not doing well or get worse. Document Released: 10/19/2007 Document Revised: 10/30/2012 Document Reviewed: 07/31/2012  [facility] Patient Information 2015 [place], [place]. This information is not intended to replace advice given to you by your health care provider.  Make sure you discuss any questions you have with your health care provider.   This patient has been given discharge instructions by this RN.  I used a variety of media:  Verbal, Handouts and Teachback.  The patients family/friends were also included with discharge instructions.  Belongings were returned to the patient.

## 2014-01-09 NOTE — Brief Op Note (Signed)
      [address]       [place] [address]             [phone]      01/09/2014  8:48 AM  PATIENT:  [name]  78 y.o. female  PRE-OPERATIVE DIAGNOSIS:  NSCLC  POST-OPERATIVE DIAGNOSIS:  NSCLC  PROCEDURE:  Procedure(s): REMOVAL PORT-A-CATH (Left)  SURGEON:  Surgeon(s) and Role:    * [name], MD - Primary    ANESTHESIA:   MAC  EBL:  Total I/O In: 300 [I.V.:300] Out: 10 [Blood:10]  BLOOD ADMINISTERED:none  DRAINS: none   LOCAL MEDICATIONS USED:  LIDOCAINE  and Amount: 11 ml  SPECIMEN:  No Specimen  DISPOSITION OF SPECIMEN:  N/A  COUNTS:  YES  DICTATION: .Dragon Dictation  PLAN OF CARE: Discharge to home after PACU  PATIENT DISPOSITION:  PACU - hemodynamically stable.   Delay start of Pharmacological VTE agent (>24hrs) due to surgical blood loss or risk of bleeding: yes

## 2014-01-09 NOTE — H&P (Signed)
[address]       [place] [address]             [phone]                        [name] [facility] Medical Record #[id number] Date of Birth: 13-Nov-1935  Referring: Tanda Rockers, MD Primary Care: Maximino Greenland, MD  Chief Complaint:    Removal of poratcath Non-small cell lung cancer   Staging form: Lung, AJCC 7th Edition     Pathologic: Stage IB (T2a, N0, cM0) - Signed by [name], MD on 04/09/2013     Clinical stage from 04/29/2013: Stage IB (T2a, N0, M0) - Signed by Heath Lark, MD on 04/29/2013  History of Present Illness:     [name] 78 y.o. female is referred for evaluation of left lower lobe lung mass. The patient had no specific symptoms referable to lung mass. She was referred to oncology because of low platelet count. (note says 37,000, patients daughter says was 137,000. She has been on Plavix since 2007 for an episode of "forgetting where she was". She denies any bleeding, but was noted to have iron deficiency anemia. Evaluation by oncology for her thrombocytopenia resulted in a chest x-ray with a left lower lobe lung mass which was not present on film 5 years earlier. A CT scan of the chest and PET scan have been performed and the patient is referred for consideration of resection of a left lower lobe hypermetabolic lung lesion highly suspicious for malignancy. This has been resect ed.  The patient has been a smoker for many years she quit one year ago. She has some shortness of breath with significant exertion. She does live alone and cares for herself without difficulty, and notes she does her own yard work including cutting the grass with a push mower.        Patient had lung resection completed chemo therapy , now here for removal of portacath   Current Activity/ Functional Status:  Patient is independent with mobility/ambulation, transfers, ADL's, IADL's.   Zubrod Score: At the time of surgery this patient's  most appropriate activity status/level should be described as: []     0    Normal activity, no symptoms [x]     1    Restricted in physical strenuous activity but ambulatory, able to do out light work []     2    Ambulatory and capable of self care, unable to do work activities, up and about               >50 % of waking hours                              []     3    Only limited self care, in bed greater than 50% of waking hours []     4    Completely disabled, no self care, confined to bed or chair []     5    Moribund   Past Medical History  Diagnosis Date  . Neurologic disorder     2009  . Unspecified vitamin D deficiency   . Arthritis   . Thrombocytopenia, unspecified 02/26/2013  . Cough 02/26/2013  . Lung mass 02/27/2013  . Hypertension   . Shortness of breath     exertion  . Hyperthyroidism  took iodine treatment for this  . Benign hypertensive kidney disease with chronic kidney disease stage I through stage IV, or unspecified     sees dr. Justin Mend again in april  . Chronic kidney disease, stage III (moderate)   . Cancer     lung ca dx'd 02/2013  . Headache   . GERD (gastroesophageal reflux disease)     Past Surgical History  Procedure Laterality Date  . Back surgery    . Neck surgery    . Eye surgery      cataract  . Eye surgery Left     open up tear duct  . Video bronchoscopy N/A 04/07/2013    Procedure: VIDEO BRONCHOSCOPY;  Surgeon: [name], MD;  Location: [facility] OR;  Service: Thoracic;  Laterality: N/A;  . Video assisted thoracoscopy (vats)/ lobectomy Left 04/07/2013    Procedure: VIDEO ASSISTED THORACOSCOPY (VATS)/ LOBECTOMY; INSERTION OF ON-Q PUMP;  Surgeon: [name], MD;  Location: [facility];  Service: Thoracic;  Laterality: Left;  . Lymph node dissection Left 04/07/2013    Procedure: LYMPH NODE DISSECTION;  Surgeon: [name], MD;  Location: [facility];  Service: Thoracic;  Laterality: Left;  . Portacath placement Left 05/06/2013    Procedure: INSERTION  PORT-A-CATH;  Surgeon: [name], MD;  Location: [facility] OR;  Service: Thoracic;  Laterality: Left;    Family History  Problem Relation Age of Onset  . Lung cancer Brother     smoked  . Breast cancer Mother   . Lung cancer Father     smoked  . Rectal cancer Neg Hx   . Stomach cancer Neg Hx   . Esophageal cancer Neg Hx   . Colon cancer Neg Hx    Patient's son died of myocardial infarction at a young age  History   Social History  . Marital Status: Single    Spouse Name: N/A    Number of Children: 2  . Years of Education: N/A   Occupational History  . Retired    Social History Main Topics  . Smoking status: Former Smoker -- 0.50 packs/day for 60 years    Types: Cigarettes    Quit date: 02/25/2012  . Smokeless tobacco: Never Used  . Alcohol Use: No  . Drug Use: No  . Sexual Activity: Not on file   Other Topics Concern  . Not on file   Social History Narrative  . No narrative on file    History  Smoking status  . Former Smoker -- 0.50 packs/day for 60 years  . Types: Cigarettes  . Quit date: 02/25/2012  Smokeless tobacco  . Never Used    History  Alcohol Use No     Allergies  Allergen Reactions  . Aspirin Other (See Comments)    REACTION: stomach upset  . Lactose Intolerance (Gi) Diarrhea and Other (See Comments)    "can't drink milk. It tears my stomach up."  . Adhesive [Tape] Rash  . Lisinopril Other (See Comments)    cough  . Percocet [Oxycodone-Acetaminophen] Itching    No current facility-administered medications for this encounter.     Review of Systems:     Cardiac Review of Systems: Y or N  Chest Pain [ n   ]  Resting SOB [   ] Exertional SOB  [  ]  Orthopnea [  ]   Pedal Edema [ n  ]    Palpitations [ n ] Syncope  [n  ]   Presyncope [  n ]  General Review of Systems: [Y] = yes [  ]=no Constitional: recent weight change [  ];  Wt loss over the last 3 months [   ] anorexia [  ]; fatigue [  ]; nausea [  ]; night sweats [  ]; fever [   ]; or chills [  ];          Dental: poor dentition[  ]; Last Dentist visit:   Eye : blurred vision [  ]; diplopia [   ]; vision changes [  ];  Amaurosis fugax[  ]; Resp: cough [ some with ace ];  wheezing[ n ];  hemoptysis[  ]; shortness of breath[  ]; paroxysmal nocturnal dyspnea[ y ]; dyspnea on exertion[  ]; or orthopnea[  ];  GI:  gallstones[  ], vomiting[  ];  dysphagia[  ]; melena[  ];  hematochezia [  ]; heartburn[  ];   Hx of  Colonoscopy[ y ]; GU: kidney stones [  ]; hematuria[  ];   dysuria [  ];  nocturia[  ];  history of     obstruction [  ]; urinary frequency [  ]             Skin: rash, swelling[  ];, hair loss[  ];  peripheral edema[  ];  or itching[  ]; Musculosketetal: myalgias[y  ];  joint swelling[  ];  joint erythema[  ];  joint pain[  ];  back pain[  ];  Heme/Lymph: bruising[  ];  bleeding[  ];  anemia[  ];  Neuro: TIA[ n ];  headaches[  n];  stroke[ ? ];  vertigo[  ];  seizures[ n ];   paresthesias[ n ];  difficulty walking[ n ];  Psych:depression[  ]; anxiety[  ];  Endocrine: diabetes[n  ];  thyroid dysfunction[  ];  Immunizations: Flu up to date [ y ]; Pneumococcal up to date [ y ];  Other:  Physical Exam: BP 164/76 mmHg  Pulse 58  Temp(Src) 97.7 F (36.5 C) (Oral)  Resp 18  Ht 5' (1.524 m)  Wt 164 lb (74.39 kg)  BMI 32.03 kg/m2  SpO2 99%  PHYSICAL EXAMINATION:  General appearance: alert, cooperative, appears stated age and no distress Neurologic: intact Heart: regular rate and rhythm, S1, S2 normal, no murmur, click, rub or gallop Lungs: clear to auscultation bilaterally Abdomen: soft, non-tender; bowel sounds normal; no masses,  no organomegaly Extremities: extremities normal, atraumatic, no cyanosis or edema and Homans sign is negative, no sign of DVT Patient has no carotid bruits, palpable DP and PT pulses bilaterally Patient has not had any cervical or supraclavicular or axillary adenopathy appreciated Left chest incision well healed Left portacath in  place  Diagnostic Studies & Laboratory data:     Recent Radiology Findings:Ct Chest W Contrast  12/31/2013   CLINICAL DATA:  Lung cancer, completed chemotherapy. Shortness of breath.  EXAM: CT CHEST WITH CONTRAST  TECHNIQUE: Multidetector CT imaging of the chest was performed during intravenous contrast administration.  CONTRAST:  46mL OMNIPAQUE IOHEXOL 300 MG/ML  SOLN  COMPARISON:  07/01/2013  FINDINGS: 3.1 by 2.6 cm solid and partially calcified left thyroid nodule, stable.  Left subclavian Port-A-Cath tip: SVC.  Atherosclerotic aortic arch. Coronary artery atherosclerosis observed in the left anterior descending artery.  The trace left pleural effusion has nearly completely resolved. Focal pleural thickening or adjacent mild rounded atelectasis in the left lower lung is reduced in size and prominence on image 35 of series  2. This currently measures up to 6 mm in thickness.  No pathologic thoracic adenopathy.  Left lower lobectomy.  Indistinct 6 by 7 mm by 4 mm ground-glass density in the left lung on image 32 of series 5 posteriorly in, stable.  Thoracic spondylosis.  Lower cervical plate and screw fixator.  Centrilobular emphysema.  Small focus of enhancement along the left side of the lateral segment left hepatic lobe, image 48 of series 2, stable, not previously hypermetabolic on prior PET-CT, probably flash filling of a hemangioma or a small perfusion anomaly.  IMPRESSION: 1. No findings of recurrence or significant change. 6 by 7 by 4 mm slightly sub solid left posterior nodule merits observation. 2. Stable left thyroid nodule. This was not previously hypermetabolic. 3. Stable small enhancing focus in the lateral segment left hepatic lobe, probably a flash filling hemangioma or perfusion anomaly, not previously hypermetabolic. 4. Centrilobular emphysema. 5. Left lower lobectomy. Focal scarring posteriorly in the remaining lower posterior left lung, with reduced size of the trace pleural effusion. 6.  Atherosclerosis.   Electronically Signed   By: Sherryl Barters M.D.   On: 12/31/2013 15:39   No results found.  Dg Chest 2 View  03/28/2013   CLINICAL DATA:  Cough  EXAM: CHEST  2 VIEW  COMPARISON:  NM PET IMAGE INITIAL (PI) SKULL BASE TO THIGH dated 03/14/2013; CT CHEST W/CM dated 02/28/2013; DG CHEST 2 VIEW dated 02/26/2013  FINDINGS: There is a rounded airspace opacity in the left lower lobe. There is no pleural effusion or pneumothorax. The heart and mediastinal contours are unremarkable.  There is evidence of prior anterior cervical disc fusion.  IMPRESSION: Persistent rounded airspace opacity in the left lower lobe which has not significantly changed compared with 02/27/2012. Given the persistence, this is concerning for malignancy until proven otherwise.   Electronically Signed   By: Kathreen Devoid   On: 03/28/2013 14:05   Nm Pet Image Initial (pi) Skull Base To Thigh  03/14/2013   CLINICAL DATA:  Initial treatment strategy for lung mass.  EXAM: NUCLEAR MEDICINE PET SKULL BASE TO THIGH  FASTING BLOOD GLUCOSE:  Value: 89 mg/dl  TECHNIQUE: 9.7 mCi F-18 FDG was injected intravenously. Full-ring PET imaging was performed from the skull base to thigh after the radiotracer. CT data was obtained and used for attenuation correction and anatomic localization.  COMPARISON:  Chest CT 02/28/2013  FINDINGS: NECK  No hypermetabolic lymph nodes in the neck. Left thyroid goiter again noted.  CHEST  The 3.8 cm left lower lobe lung mass demonstrates marked FDG uptake with SUV max of 15.2. No metabolically active mediastinal or hilar lymph nodes. No other pulmonary nodules are identified.  ABDOMEN/PELVIS  No abnormal hypermetabolic activity within the liver, pancreas, adrenal glands, or spleen. No hypermetabolic lymph nodes in the abdomen or pelvis.  SKELETON  No focal hypermetabolic activity to suggest skeletal metastasis.  IMPRESSION: 3.8 cm hypermetabolic left lower lobe pulmonary mass consistent with neoplasm. No  mediastinal or hilar adenopathy and no evidence for metastatic disease involving the neck, chest, abdomen or pelvis. No osseous metastasis.   Electronically Signed   By: Kalman Jewels M.D.   On: 03/14/2013 15:00      Recent Lab Findings: Lab Results  Component Value Date   WBC 4.9 01/07/2014   HGB 11.5* 01/07/2014   HCT 35.0* 01/07/2014   PLT 254 01/07/2014   GLUCOSE 90 01/07/2014   ALT 23 12/31/2013   AST 27 12/31/2013   NA 138 01/07/2014  K 4.7 01/07/2014   CL 105 01/07/2014   CREATININE 1.33* 01/07/2014   BUN 17 01/07/2014   CO2 21 01/07/2014   INR 0.96 05/06/2013      Assessment / Plan:   Plan portacath  removal The goals risks and alternatives of the planned surgical procedure portacath removal have been discussed with the patient in detail. The risks of the procedure including death, infection, stroke, myocardial infarction, bleeding, blood transfusion, pneumothorax and need for chest tube have all been discussed specifically.  I have quoted Barrie Dunker a 0.5 % of perioperative mortality and a complication rate as high as 10 %. The patient's questions have been answered.[name] is willing  to proceed with the planned procedure.  [name] MD      [address] [place] [address] Office [phone]   Beeper [phone]  01/09/2014 6:52 AM

## 2014-01-12 NOTE — Op Note (Signed)
[name], [name] NO.:  [id number]  MEDICAL RECORD NO.:  [id number]  LOCATION:                                 FACILITY:  PHYSICIAN:  Lanelle Bal, MD    DATE OF BIRTH:  May 01, 1935  DATE OF PROCEDURE:  01/09/2014 DATE OF DISCHARGE:  01/09/2014                              OPERATIVE REPORT   PREOPERATIVE DIAGNOSIS:  History of non-small cell carcinoma of the lung following surgical resection and postoperative chemotherapy now for removal of Port-A-Cath.  POSTOPERATIVE DIAGNOSIS:  History of non-small cell carcinoma of the lung following surgical resection and postoperative chemotherapy now for removal of Port-A-Cath.  SURGICAL PROCEDURE:  Removal of Port-A-Cath.  SURGEON:  Lanelle Bal, MD  BRIEF HISTORY:  The patient is a 78 year old female, who in the spring of 2015, underwent evaluation for lung mass, head resection and subsequent chemotherapy.  Followup scans showed no evidence of recurrence.  The patient was referred by Dr. Alvy Bimler for removal of Port- A-Cath.  Procedure was explained to the patient in detail and she was agreeable with proceeding.  DESCRIPTION OF PROCEDURE:  After appropriate time-out was performed identifying the patient and with MAC anesthesia, the chest was prepped with Betadine and draped in usual sterile manner.  A 1% lidocaine a total of 11 mL was infiltrated in the area of the left subclavian Port-A- Cath.  Incision was made through the old scar and dissection carried down to the purple port.  The retaining suture was removed.  The patient's head was put in the down position and the port removed without difficulty.  Suture closure of the port trach was placed with a interrupted 3-0 Vicryl.  Interrupted 3-0 Vicryls were placed in the pocket layers to obliterate the pocket and a 4-0 subcuticular stitch was placed to approximate the skin edges.  Dermabond was applied.  The patient tolerated the procedure without obvious  complication with no blood loss.  Sponge and needle count was reported as correct.  She was transferred to the recovery room for postoperative observation and later discharged home.     Lanelle Bal, MD     EG/MEDQ  D:  01/09/2014  T:  01/10/2014  Job:  [id number]

## 2014-01-13 ENCOUNTER — Encounter (HOSPITAL_COMMUNITY): Payer: Self-pay | Admitting: Cardiothoracic Surgery

## 2014-02-06 DIAGNOSIS — N183 Chronic kidney disease, stage 3 (moderate): Secondary | ICD-10-CM | POA: Diagnosis not present

## 2014-02-06 DIAGNOSIS — E780 Pure hypercholesterolemia: Secondary | ICD-10-CM | POA: Diagnosis not present

## 2014-02-06 DIAGNOSIS — I129 Hypertensive chronic kidney disease with stage 1 through stage 4 chronic kidney disease, or unspecified chronic kidney disease: Secondary | ICD-10-CM | POA: Diagnosis not present

## 2014-02-06 DIAGNOSIS — Z79899 Other long term (current) drug therapy: Secondary | ICD-10-CM | POA: Diagnosis not present

## 2014-02-06 DIAGNOSIS — Z85118 Personal history of other malignant neoplasm of bronchus and lung: Secondary | ICD-10-CM | POA: Diagnosis not present

## 2014-02-06 DIAGNOSIS — D0220 Carcinoma in situ of unspecified bronchus and lung: Secondary | ICD-10-CM | POA: Diagnosis not present

## 2014-03-24 DIAGNOSIS — Z79899 Other long term (current) drug therapy: Secondary | ICD-10-CM | POA: Diagnosis not present

## 2014-03-24 DIAGNOSIS — N183 Chronic kidney disease, stage 3 (moderate): Secondary | ICD-10-CM | POA: Diagnosis not present

## 2014-03-24 DIAGNOSIS — I129 Hypertensive chronic kidney disease with stage 1 through stage 4 chronic kidney disease, or unspecified chronic kidney disease: Secondary | ICD-10-CM | POA: Diagnosis not present

## 2014-04-15 DIAGNOSIS — N183 Chronic kidney disease, stage 3 (moderate): Secondary | ICD-10-CM | POA: Diagnosis not present

## 2014-04-15 DIAGNOSIS — I129 Hypertensive chronic kidney disease with stage 1 through stage 4 chronic kidney disease, or unspecified chronic kidney disease: Secondary | ICD-10-CM | POA: Diagnosis not present

## 2014-04-15 DIAGNOSIS — Z79899 Other long term (current) drug therapy: Secondary | ICD-10-CM | POA: Diagnosis not present

## 2014-04-15 DIAGNOSIS — I10 Essential (primary) hypertension: Secondary | ICD-10-CM | POA: Diagnosis not present

## 2014-04-28 DIAGNOSIS — H402232 Chronic angle-closure glaucoma, bilateral, moderate stage: Secondary | ICD-10-CM | POA: Diagnosis not present

## 2014-05-15 DIAGNOSIS — R238 Other skin changes: Secondary | ICD-10-CM | POA: Diagnosis not present

## 2014-05-15 DIAGNOSIS — Z79899 Other long term (current) drug therapy: Secondary | ICD-10-CM | POA: Diagnosis not present

## 2014-06-03 DIAGNOSIS — N2581 Secondary hyperparathyroidism of renal origin: Secondary | ICD-10-CM | POA: Diagnosis not present

## 2014-06-03 DIAGNOSIS — D631 Anemia in chronic kidney disease: Secondary | ICD-10-CM | POA: Diagnosis not present

## 2014-06-03 DIAGNOSIS — N183 Chronic kidney disease, stage 3 (moderate): Secondary | ICD-10-CM | POA: Diagnosis not present

## 2014-07-03 ENCOUNTER — Telehealth: Payer: Self-pay | Admitting: *Deleted

## 2014-07-03 ENCOUNTER — Telehealth: Payer: Self-pay | Admitting: Hematology and Oncology

## 2014-07-03 ENCOUNTER — Ambulatory Visit (HOSPITAL_BASED_OUTPATIENT_CLINIC_OR_DEPARTMENT_OTHER): Payer: Medicare Other | Admitting: Hematology and Oncology

## 2014-07-03 ENCOUNTER — Other Ambulatory Visit (HOSPITAL_BASED_OUTPATIENT_CLINIC_OR_DEPARTMENT_OTHER): Payer: Medicare Other

## 2014-07-03 ENCOUNTER — Ambulatory Visit (HOSPITAL_COMMUNITY)
Admission: RE | Admit: 2014-07-03 | Discharge: 2014-07-03 | Disposition: A | Discharge status: Home / Self Care | Payer: Medicare Other | Source: Ambulatory Visit | Attending: Hematology and Oncology | Admitting: Hematology and Oncology

## 2014-07-03 ENCOUNTER — Encounter: Payer: Self-pay | Admitting: Hematology and Oncology

## 2014-07-03 VITALS — BP 143/65 | HR 57 | Temp 97.6°F | Resp 18 | Ht 60.0 in | Wt 174.0 lb

## 2014-07-03 DIAGNOSIS — I10 Essential (primary) hypertension: Secondary | ICD-10-CM

## 2014-07-03 DIAGNOSIS — C3490 Malignant neoplasm of unspecified part of unspecified bronchus or lung: Secondary | ICD-10-CM

## 2014-07-03 DIAGNOSIS — R208 Other disturbances of skin sensation: Secondary | ICD-10-CM | POA: Diagnosis not present

## 2014-07-03 DIAGNOSIS — N182 Chronic kidney disease, stage 2 (mild): Secondary | ICD-10-CM

## 2014-07-03 DIAGNOSIS — C3432 Malignant neoplasm of lower lobe, left bronchus or lung: Secondary | ICD-10-CM

## 2014-07-03 DIAGNOSIS — L7682 Other postprocedural complications of skin and subcutaneous tissue: Secondary | ICD-10-CM

## 2014-07-03 DIAGNOSIS — C3492 Malignant neoplasm of unspecified part of left bronchus or lung: Secondary | ICD-10-CM

## 2014-07-03 LAB — COMPREHENSIVE METABOLIC PANEL (CC13)
ALBUMIN: 4 g/dL (ref 3.5–5.0)
ALK PHOS: 72 U/L (ref 40–150)
ALT: 20 U/L (ref 0–55)
AST: 28 U/L (ref 5–34)
Anion Gap: 9 mEq/L (ref 3–11)
BILIRUBIN TOTAL: 0.31 mg/dL (ref 0.20–1.20)
BUN: 35.3 mg/dL — ABNORMAL HIGH (ref 7.0–26.0)
CALCIUM: 9.3 mg/dL (ref 8.4–10.4)
CHLORIDE: 112 meq/L — AB (ref 98–109)
CO2: 19 meq/L — AB (ref 22–29)
Creatinine: 1.9 mg/dL — ABNORMAL HIGH (ref 0.6–1.1)
EGFR: 30 mL/min/{1.73_m2} — AB (ref >90)
GLUCOSE: 110 mg/dL (ref 70–140)
Potassium: 4.6 mEq/L (ref 3.5–5.1)
Sodium: 140 mEq/L (ref 136–145)
Total Protein: 7.1 g/dL (ref 6.4–8.3)

## 2014-07-03 LAB — CBC WITH DIFFERENTIAL/PLATELET
BASO%: 0.9 % (ref 0.0–2.0)
Basophils Absolute: 0 10*3/uL (ref 0.0–0.1)
EOS%: 3.2 % (ref 0.0–7.0)
Eosinophils Absolute: 0.2 10*3/uL (ref 0.0–0.5)
HCT: 34.6 % — ABNORMAL LOW (ref 34.8–46.6)
HGB: 11.6 g/dL (ref 11.6–15.9)
LYMPH%: 23.5 % (ref 14.0–49.7)
MCH: 31.1 pg (ref 25.1–34.0)
MCHC: 33.4 g/dL (ref 31.5–36.0)
MCV: 93.2 fL (ref 79.5–101.0)
MONO#: 0.7 10*3/uL (ref 0.1–0.9)
MONO%: 14.2 % — ABNORMAL HIGH (ref 0.0–14.0)
NEUT#: 3 10*3/uL (ref 1.5–6.5)
NEUT%: 58.2 % (ref 38.4–76.8)
Platelets: 250 10*3/uL (ref 145–400)
RBC: 3.72 10*6/uL (ref 3.70–5.45)
RDW: 15.2 % — ABNORMAL HIGH (ref 11.2–14.5)
WBC: 5.2 10*3/uL (ref 3.9–10.3)
lymph#: 1.2 10*3/uL (ref 0.9–3.3)

## 2014-07-03 NOTE — Assessment & Plan Note (Signed)
Her blood pressure is under good control but she could not tolerate the amlodipine. She felt better since discontinuation of amlodipine. I recommend close blood pressure monitoring and encouraged her to see primary care provider for medication adjustment in the future.    

## 2014-07-03 NOTE — Assessment & Plan Note (Signed)
She declined gabapentin due to fear for side effects. She will continue taking Tylenol as needed. 

## 2014-07-03 NOTE — Progress Notes (Signed)
[place] OFFICE PROGRESS NOTE  Patient Care Team: Glendale Chard, MD as PCP - General (Internal Medicine) Heath Lark, MD as Consulting Physician (Hematology and Oncology) [name], MD as Consulting Physician (Cardiothoracic Surgery)  SUMMARY OF ONCOLOGIC HISTORY: Oncology History   Non-small cell lung cancer, adenocarcinoma   Primary site: Lung (Left)   Staging method: AJCC 7th Edition   Clinical: Stage IB (T2a, N0, M0) signed by Heath Lark, MD on 04/29/2013  9:01 PM   Pathologic: Stage IB (T2a, N0, cM0) signed by [name], MD on 04/09/2013 12:51 PM   Summary: Stage IB (T2a, N0, cM0)       Non-small cell lung cancer   02/26/2013 Imaging CXR for evaluation of cough showed new left lung mass   02/28/2013 Imaging CT chest showed left new lung nodule   03/14/2013 Imaging PET/CT scan showed localized disease in the left lung   04/07/2013 Surgery She underwent bronchoscopy and left lower lobe resection and lymph node sampling with negative margins   05/06/2013 Procedure The patient has placement of Infuse-a-Port   05/21/2013 - 07/30/2013 Chemotherapy She completed 4 cycles of adjuvant chemotherapy with navelbine and cisplatin   07/01/2013 Imaging CT scan of the chest shows stable appearance.   07/02/2013 Adverse Reaction Dose of chemotherapy was adjusted further due to severe anemia.   12/31/2013 Imaging CT scan of the chest show no evidence of disease recurrence   07/03/2014 Imaging Chest x-ray looks normal    INTERVAL HISTORY: Please see below for problem oriented charting. She returns for further follow-up. She feels well from intermittent incisional discomfort at prior surgical site. Denies any chest pain, shortness of breath or cough.  REVIEW OF SYSTEMS:   Constitutional: Denies fevers, chills or abnormal weight loss Eyes: Denies blurriness of vision Ears, nose, mouth, throat, and face: Denies mucositis or sore throat Respiratory: Denies cough, dyspnea or  wheezes Cardiovascular: Denies palpitation, chest discomfort or lower extremity swelling Gastrointestinal:  Denies nausea, heartburn or change in bowel habits Skin: Denies abnormal skin rashes Lymphatics: Denies new lymphadenopathy or easy bruising Neurological:Denies numbness, tingling or new weaknesses Behavioral/Psych: Mood is stable, no new changes  All other systems were reviewed with the patient and are negative.  I have reviewed the past medical history, past surgical history, social history and family history with the patient and they are unchanged from previous note.  ALLERGIES:  is allergic to aspirin; lactose intolerance (gi); adhesive; lisinopril; and percocet.  MEDICATIONS:  Current Outpatient Prescriptions  Medication Sig Dispense Refill  . amLODipine (NORVASC) 10 MG tablet Take 1 tablet (10 mg total) by mouth daily. 90 tablet 1  . atorvastatin (LIPITOR) 10 MG tablet Take 10 mg by mouth every evening.  3  . chlorthalidone (HYGROTON) 25 MG tablet Take 1 tablet by mouth daily.  4  . Cholecalciferol (VITAMIN D-3) 1000 UNITS CAPS Take 1,000 Units by mouth daily.     . clopidogrel (PLAVIX) 75 MG tablet Take 75 mg by mouth daily with breakfast.     . ferrous sulfate 325 (65 FE) MG tablet Take 325 mg by mouth daily.   5  . lidocaine-prilocaine (EMLA) cream Apply 1 application topically as needed (for port).     . magnesium oxide (MAG-OX) 400 MG tablet Take 400 mg by mouth daily.    . metoprolol succinate (TOPROL-XL) 25 MG 24 hr tablet Take 25 mg by mouth daily.     . Multiple Vitamin (MULTIVITAMIN) tablet Take 1 tablet by mouth at  bedtime.     Marland Kitchen tobramycin (TOBREX) 0.3 % ophthalmic solution PLACE 1 DROP INTO THE RIGHT EYE EVERY 4 HOURS (Patient taking differently: PLACE 1 DROP INTP THE RIGHT EYE AT BEDTIME) 5 mL 0  . TRAVATAN Z 0.004 % SOLN ophthalmic solution Apply 1 drop to eye daily. In affected eye  6  . travoprost, benzalkonium, (TRAVATAN) 0.004 % ophthalmic solution Place 1  drop into both eyes at bedtime.    . valsartan (DIOVAN) 160 MG tablet Take 1 tablet by mouth daily.  3  . valsartan (DIOVAN) 80 MG tablet Take 1 tablet (80 mg total) by mouth daily. (Patient taking differently: Take 80 mg by mouth at bedtime. )     No current facility-administered medications for this visit.    PHYSICAL EXAMINATION: ECOG PERFORMANCE STATUS: 0 - Asymptomatic  Filed Vitals:   07/03/14 1119  BP: 143/65  Pulse: 57  Temp: 97.6 F (36.4 C)  Resp: 18   Filed Weights   07/03/14 1119  Weight: 174 lb (78.926 kg)    GENERAL:alert, no distress and comfortable SKIN: skin color, texture, turgor are normal, no rashes or significant lesions EYES: normal, Conjunctiva are pink and non-injected, sclera clear OROPHARYNX:no exudate, no erythema and lips, buccal mucosa, and tongue normal  NECK: supple, thyroid normal size, non-tender, without nodularity LYMPH:  no palpable lymphadenopathy in the cervical, axillary or inguinal LUNGS: clear to auscultation and percussion with normal breathing effort HEART: regular rate & rhythm and no murmurs and no lower extremity edema ABDOMEN:abdomen soft, non-tender and normal bowel sounds Musculoskeletal:no cyanosis of digits and no clubbing  NEURO: alert & oriented x 3 with fluent speech, no focal motor/sensory deficits  LABORATORY DATA:  I have reviewed the data as listed    Component Value Date/Time   NA 140 07/03/2014 1106   NA 138 01/07/2014 1239   K 4.6 07/03/2014 1106   K 4.7 01/07/2014 1239   CL 105 01/07/2014 1239   CO2 19* 07/03/2014 1106   CO2 21 01/07/2014 1239   GLUCOSE 110 07/03/2014 1106   GLUCOSE 90 01/07/2014 1239   BUN 35.3* 07/03/2014 1106   BUN 17 01/07/2014 1239   CREATININE 1.9* 07/03/2014 1106   CREATININE 1.33* 01/07/2014 1239   CALCIUM 9.3 07/03/2014 1106   CALCIUM 9.7 01/07/2014 1239   PROT 7.1 07/03/2014 1106   PROT 6.5 05/06/2013 0619   ALBUMIN 4.0 07/03/2014 1106   ALBUMIN 3.5 05/06/2013 0619   AST  28 07/03/2014 1106   AST 24 05/06/2013 0619   ALT 20 07/03/2014 1106   ALT 17 05/06/2013 0619   ALKPHOS 72 07/03/2014 1106   ALKPHOS 70 05/06/2013 0619   BILITOT 0.31 07/03/2014 1106   BILITOT <0.2* 05/06/2013 0619   GFRNONAA 38* 01/07/2014 1239   GFRAA 44* 01/07/2014 1239    No results found for: SPEP, UPEP  Lab Results  Component Value Date   WBC 5.2 07/03/2014   NEUTROABS 3.0 07/03/2014   HGB 11.6 07/03/2014   HCT 34.6* 07/03/2014   MCV 93.2 07/03/2014   PLT 250 07/03/2014      Chemistry      Component Value Date/Time   NA 140 07/03/2014 1106   NA 138 01/07/2014 1239   K 4.6 07/03/2014 1106   K 4.7 01/07/2014 1239   CL 105 01/07/2014 1239   CO2 19* 07/03/2014 1106   CO2 21 01/07/2014 1239   BUN 35.3* 07/03/2014 1106   BUN 17 01/07/2014 1239   CREATININE 1.9* 07/03/2014 1106  CREATININE 1.33* 01/07/2014 1239      Component Value Date/Time   CALCIUM 9.3 07/03/2014 1106   CALCIUM 9.7 01/07/2014 1239   ALKPHOS 72 07/03/2014 1106   ALKPHOS 70 05/06/2013 0619   AST 28 07/03/2014 1106   AST 24 05/06/2013 0619   ALT 20 07/03/2014 1106   ALT 17 05/06/2013 0619   BILITOT 0.31 07/03/2014 1106   BILITOT <0.2* 05/06/2013 0619       RADIOGRAPHIC STUDIES: I have personally reviewed the radiological images as listed and agreed with the findings in the report. Dg Chest 2 View  07/03/2014   CLINICAL DATA:  Non-small-cell lung cancer.  Exclude recurrence  EXAM: CHEST  2 VIEW  COMPARISON:  05/06/2013.  Chest CT 12/31/2013  FINDINGS: Port-A-Cath has been removed.  ACDF C6-7.  The lungs are clear. Negative for pneumonia or effusion. No mass or adenopathy. Small density left lung base on the prior CT is not identified today.  IMPRESSION: No active cardiopulmonary disease.   Electronically Signed   By: Franchot Gallo M.D.   On: 07/03/2014 12:56     ASSESSMENT & PLAN:  Non-small cell lung cancer She is doing well with no evidence of disease recurrence. I will see her back in  6 months with repeat blood work, history, physical examination and chest CT    Chronic kidney disease (CKD), stage II (mild) This is  mildly worse compared to prior visit.Marland Kitchen Recommend increase oral fluid hydration  Essential hypertension, benign Her blood pressure is under good control but she could not tolerate the amlodipine. She felt better since discontinuation of amlodipine. I recommend close blood pressure monitoring and encouraged her to see primary care provider for medication adjustment in the future.     Incisional pain She declined gabapentin due to fear for side effects. She will continue taking Tylenol as needed.     Orders Placed This Encounter  Procedures  . CT Chest Wo Contrast    Standing Status: Future     Number of Occurrences:      Standing Expiration Date: 09/02/2015    Order Specific Question:  Reason for Exam (SYMPTOM  OR DIAGNOSIS REQUIRED)    Answer:  lung ca, exclude recurrence    Order Specific Question:  Preferred imaging location?    Answer:  [facility]  . Comprehensive metabolic panel    Standing Status: Future     Number of Occurrences:      Standing Expiration Date: 09/02/2015  . CBC with Differential/Platelet    Standing Status: Future     Number of Occurrences:      Standing Expiration Date: 09/02/2015   All questions were answered. The patient knows to call the clinic with any problems, questions or concerns. No barriers to learning was detected. I spent 15 minutes counseling the patient face to face. The total time spent in the appointment was 20 minutes and more than 50% was on counseling and review of test results     [facility], [place], MD 07/03/2014 1:08 PM

## 2014-07-03 NOTE — Assessment & Plan Note (Signed)
This is  mildly worse compared to prior visit.Marland Kitchen Recommend increase oral fluid hydration

## 2014-07-03 NOTE — Assessment & Plan Note (Signed)
She is doing well with no evidence of disease recurrence. I will see her back in 6 months with repeat blood work, history, physical examination and chest CT

## 2014-07-03 NOTE — Telephone Encounter (Signed)
Gave adn printed appt sched and avs for pt for June and DEc

## 2014-07-03 NOTE — Telephone Encounter (Signed)
Daughter notified of message below

## 2014-07-03 NOTE — Telephone Encounter (Signed)
-----   Message from Heath Lark, MD sent at 07/03/2014  1:07 PM EDT ----- Regarding: tests Pls tell her or daughter increase oral fluids due to mildly elevated creatinine CXR looks fine ----- Message -----    From: [facility] Interface    Sent: 07/03/2014  11:20 AM      To: Heath Lark, MD

## 2014-07-07 DIAGNOSIS — L821 Other seborrheic keratosis: Secondary | ICD-10-CM | POA: Diagnosis not present

## 2014-07-07 DIAGNOSIS — L304 Erythema intertrigo: Secondary | ICD-10-CM | POA: Diagnosis not present

## 2014-07-16 ENCOUNTER — Ambulatory Visit: Payer: Medicare Other | Admitting: Cardiothoracic Surgery

## 2014-07-17 ENCOUNTER — Ambulatory Visit (INDEPENDENT_AMBULATORY_CARE_PROVIDER_SITE_OTHER): Payer: Medicare Other | Admitting: Cardiothoracic Surgery

## 2014-07-17 ENCOUNTER — Encounter: Payer: Self-pay | Admitting: Cardiothoracic Surgery

## 2014-07-17 ENCOUNTER — Other Ambulatory Visit: Payer: Self-pay | Admitting: *Deleted

## 2014-07-17 VITALS — BP 157/76 | HR 60 | Resp 16 | Ht 60.0 in | Wt 174.0 lb

## 2014-07-17 DIAGNOSIS — Z9889 Other specified postprocedural states: Secondary | ICD-10-CM | POA: Diagnosis not present

## 2014-07-17 DIAGNOSIS — Z902 Acquired absence of lung [part of]: Secondary | ICD-10-CM

## 2014-07-17 DIAGNOSIS — C3492 Malignant neoplasm of unspecified part of left bronchus or lung: Secondary | ICD-10-CM

## 2014-07-17 DIAGNOSIS — C3432 Malignant neoplasm of lower lobe, left bronchus or lung: Secondary | ICD-10-CM

## 2014-07-17 NOTE — Progress Notes (Signed)
[address]       [place] [address]             [phone]      [name] Medical Record #[id number] Date of Birth: 05/12/1935  Referring: Tanda Rockers, MD Primary Care: Maximino Greenland, MD  Chief Complaint:   POST OP FOLLOW UP 04/07/2013  OPERATIVE REPORT  Preop diagnosis: Left lower lobe lung mass suspicious for primary carcinoma the lung  Postop diagnosis: Same, non-small cell lung cancer probably adenocarcinoma by frozen section  Procedure:  Bronchoscopy  Left video-assisted thoracoscopy  Left lower lobectomy and lymph node dissection  Placement of On-Q device  Surgeon: [name] MD   Stage IB ( pT2a,pN0,cM0)  Diagnosis 1. Lung, resection (segmental or lobe), Left lower lobe - INVASIVE ADENOCARCINOMA, POORLY DIFFERENTIATED, SPANNING 4.1 CM. - ADENOCARCINOMA INVOLVES PLEURA. - THE SURGICAL RESECTION MARGINS ARE NEGATIVE FOR ADENOCARCINOMA. - ONE BENIGN HILAR LYMPH NODE (0/1). - SEE ONCOLOGY TABLE BELOW. 2. Lymph node, biopsy, 8 L - THERE IS NO EVIDENCE OF CARCINOMA IN 1 OF 1 LYMPH NODE (0/1). 3. Lymph node, biopsy, 11 L - THERE IS NO EVIDENCE OF CARCINOMA IN 1 OF 1 LYMPH NODE (0/1). 4. Lymph node, biopsy, 12 L - THERE IS NO EVIDENCE OF CARCINOMA IN 1 OF 1 LYMPH NODE (0/1). 5. Lymph node, biopsy, 10 L - THERE IS NO EVIDENCE OF CARCINOMA IN 1 OF 1 LYMPH NODE (0/1). 6. Lymph node, biopsy, 4 L - THERE IS NO EVIDENCE OF CARCINOMA IN 1 OF 1 LYMPH NODE (0/1).  History of Present Illness:      Patient is making excellent progress following lobectomy especially considering her age of 50 years. She has completed course of chemo in OCT 2015She completed 4 cycles of adjuvant chemotherapy with navelbine and cisplatin , now is 16 months postop. Patient remains active.   Past Medical History  Diagnosis Date  . Neurologic disorder     2009  . Unspecified vitamin D deficiency   . Arthritis   . Thrombocytopenia, unspecified  02/26/2013  . Cough 02/26/2013  . Lung mass 02/27/2013  . Hypertension   . Shortness of breath     exertion  . Hyperthyroidism     took iodine treatment for this  . Benign hypertensive kidney disease with chronic kidney disease stage I through stage IV, or unspecified     sees dr. Justin Mend again in april  . Chronic kidney disease, stage III (moderate)   . Cancer     lung ca dx'd 02/2013  . Headache   . GERD (gastroesophageal reflux disease)      History  Smoking status  . Former Smoker -- 0.50 packs/day for 60 years  . Types: Cigarettes  . Quit date: 02/25/2012  Smokeless tobacco  . Never Used    History  Alcohol Use No     Allergies  Allergen Reactions  . Aspirin Other (See Comments)    REACTION: stomach upset  . Lactose Intolerance (Gi) Diarrhea and Other (See Comments)    "can't drink milk. It tears my stomach up."  . Adhesive [Tape] Rash  . Lisinopril Other (See Comments)    cough  . Percocet [Oxycodone-Acetaminophen] Itching    Current Outpatient Prescriptions  Medication Sig Dispense Refill  . amLODipine (NORVASC) 10 MG tablet Take 1 tablet (10 mg total) by mouth daily. 90 tablet 1  . chlorthalidone (HYGROTON) 25 MG tablet Take 1 tablet by mouth daily.  4  . Cholecalciferol (VITAMIN D-3) 1000 UNITS CAPS Take 1,000 Units by mouth daily.     . clopidogrel (PLAVIX) 75 MG tablet Take 75 mg by mouth daily with breakfast.     . magnesium oxide (MAG-OX) 400 MG tablet Take 400 mg by mouth daily.    . metoprolol succinate (TOPROL-XL) 25 MG 24 hr tablet Take 25 mg by mouth daily.     . Multiple Vitamin (MULTIVITAMIN) tablet Take 1 tablet by mouth at bedtime.     . TRAVATAN Z 0.004 % SOLN ophthalmic solution Apply 1 drop to eye daily. In affected eye  6  . valsartan (DIOVAN) 80 MG tablet Take 1 tablet (80 mg total) by mouth daily. (Patient taking differently: Take 80 mg by mouth at bedtime. )     No current facility-administered medications for this visit.       Physical  Exam: BP 157/76 mmHg  Pulse 60  Resp 16  Ht 5' (1.524 m)  Wt 174 lb (78.926 kg)  BMI 33.98 kg/m2  SpO2 98%  General appearance: alert and cooperative Neurologic: intact Heart: regular rate and rhythm, S1, S2 normal, no murmur, click, rub or gallop Lungs: clear to auscultation bilaterally Abdomen: soft, non-tender; bowel sounds normal; no masses,  no organomegaly Extremities: extremities normal, atraumatic, no cyanosis or edema and Homans sign is negative, no sign of DVT Wound: Her left chest incisions are well-healed   Diagnostic Studies & Laboratory data:     Recent Radiology Findings:  Dg Chest 2 View  07/03/2014   CLINICAL DATA:  Non-small-cell lung cancer.  Exclude recurrence  EXAM: CHEST  2 VIEW  COMPARISON:  05/06/2013.  Chest CT 12/31/2013  FINDINGS: Port-A-Cath has been removed.  ACDF C6-7.  The lungs are clear. Negative for pneumonia or effusion. No mass or adenopathy. Small density left lung base on the prior CT is not identified today.  IMPRESSION: No active cardiopulmonary disease.   Electronically Signed   By: Franchot Gallo M.D.   On: 07/03/2014 12:56   CLINICAL DATA: Lung cancer, completed chemotherapy. Shortness of breath.  EXAM: CT CHEST WITH CONTRAST  TECHNIQUE: Multidetector CT imaging of the chest was performed during intravenous contrast administration.  CONTRAST: 20m OMNIPAQUE IOHEXOL 300 MG/ML SOLN  COMPARISON: 07/01/2013  FINDINGS: 3.1 by 2.6 cm solid and partially calcified left thyroid nodule, stable.  Left subclavian Port-A-Cath tip: SVC.  Atherosclerotic aortic arch. Coronary artery atherosclerosis observed in the left anterior descending artery.  The trace left pleural effusion has nearly completely resolved. Focal pleural thickening or adjacent mild rounded atelectasis in the left lower lung is reduced in size and prominence on image 35 of series 2. This currently measures up to 6 mm in thickness.  No pathologic thoracic  adenopathy.  Left lower lobectomy.  Indistinct 6 by 7 mm by 4 mm ground-glass density in the left lung on image 32 of series 5 posteriorly in, stable.  Thoracic spondylosis. Lower cervical plate and screw fixator.  Centrilobular emphysema.  Small focus of enhancement along the left side of the lateral segment left hepatic lobe, image 48 of series 2, stable, not previously hypermetabolic on prior PET-CT, probably flash filling of a hemangioma or a small perfusion anomaly.  IMPRESSION: 1. No findings of recurrence or significant change. 6 by 7 by 4 mm slightly sub solid left posterior nodule merits observation. 2. Stable left thyroid nodule. This was not previously hypermetabolic. 3. Stable small enhancing focus in the lateral segment left hepatic lobe, probably a  flash filling hemangioma or perfusion anomaly, not previously hypermetabolic. 4. Centrilobular emphysema. 5. Left lower lobectomy. Focal scarring posteriorly in the remaining lower posterior left lung, with reduced size of the trace pleural effusion. 6. Atherosclerosis.   Electronically Signed  By: Sherryl Barters M.D.  On: 12/31/2013 15:39  Recent Lab Findings: Lab Results  Component Value Date   WBC 5.2 07/03/2014   HGB 11.6 07/03/2014   HCT 34.6* 07/03/2014   PLT 250 07/03/2014   GLUCOSE 110 07/03/2014   ALT 20 07/03/2014   AST 28 07/03/2014   NA 140 07/03/2014   K 4.6 07/03/2014   CL 105 01/07/2014   CREATININE 1.9* 07/03/2014   BUN 35.3* 07/03/2014   CO2 19* 07/03/2014   INR 0.96 05/06/2013      Assessment / Plan:   Patient doing well following left lower lobectomy for a 4.2 cm adenocarcinoma stage IB 16 months ago, Last ct of chest was dec 2015- still has areas that need follow up, will get ct of chest now, 6 months from previous ct. I will see her back after ct done She completed 4 cycles of adjuvant chemotherapy with navelbine and cisplatin   [name] MD      [address] [place] [address] Office [phone]   Beeper [phone]  07/17/2014 11:11 AM

## 2014-07-22 DIAGNOSIS — N183 Chronic kidney disease, stage 3 (moderate): Secondary | ICD-10-CM | POA: Diagnosis not present

## 2014-07-22 DIAGNOSIS — Z79899 Other long term (current) drug therapy: Secondary | ICD-10-CM | POA: Diagnosis not present

## 2014-07-22 DIAGNOSIS — I129 Hypertensive chronic kidney disease with stage 1 through stage 4 chronic kidney disease, or unspecified chronic kidney disease: Secondary | ICD-10-CM | POA: Diagnosis not present

## 2014-07-22 DIAGNOSIS — Z87891 Personal history of nicotine dependence: Secondary | ICD-10-CM | POA: Diagnosis not present

## 2014-07-23 ENCOUNTER — Ambulatory Visit
Admission: RE | Admit: 2014-07-23 | Discharge: 2014-07-23 | Disposition: A | Discharge status: Home / Self Care | Payer: Medicare Other | Source: Ambulatory Visit | Attending: Cardiothoracic Surgery | Admitting: Cardiothoracic Surgery

## 2014-07-23 ENCOUNTER — Ambulatory Visit (INDEPENDENT_AMBULATORY_CARE_PROVIDER_SITE_OTHER): Payer: Medicare Other | Admitting: Cardiothoracic Surgery

## 2014-07-23 ENCOUNTER — Encounter: Payer: Self-pay | Admitting: Cardiothoracic Surgery

## 2014-07-23 ENCOUNTER — Other Ambulatory Visit: Payer: Medicare Other

## 2014-07-23 VITALS — BP 140/72 | HR 63 | Resp 20 | Ht 60.0 in | Wt 174.0 lb

## 2014-07-23 DIAGNOSIS — C3492 Malignant neoplasm of unspecified part of left bronchus or lung: Secondary | ICD-10-CM

## 2014-07-23 DIAGNOSIS — Z85118 Personal history of other malignant neoplasm of bronchus and lung: Secondary | ICD-10-CM | POA: Diagnosis not present

## 2014-07-23 DIAGNOSIS — R0602 Shortness of breath: Secondary | ICD-10-CM | POA: Diagnosis not present

## 2014-07-23 DIAGNOSIS — E041 Nontoxic single thyroid nodule: Secondary | ICD-10-CM | POA: Diagnosis not present

## 2014-07-23 DIAGNOSIS — Z9889 Other specified postprocedural states: Secondary | ICD-10-CM

## 2014-07-23 DIAGNOSIS — Z902 Acquired absence of lung [part of]: Secondary | ICD-10-CM

## 2014-07-23 DIAGNOSIS — C3432 Malignant neoplasm of lower lobe, left bronchus or lung: Secondary | ICD-10-CM

## 2014-07-23 NOTE — Progress Notes (Signed)
[address]       [place] [address]             [phone]      Gerrianne Szostak [facility] Medical Record #[id number] Date of Birth: 04-01-1935  Referring: Tanda Rockers, MD Primary Care: Maximino Greenland, MD  Chief Complaint:   POST OP FOLLOW UP 04/07/2013  OPERATIVE REPORT  Preop diagnosis: Left lower lobe lung mass suspicious for primary carcinoma the lung  Postop diagnosis: Same, non-small cell lung cancer probably adenocarcinoma by frozen section  Procedure:  Bronchoscopy  Left video-assisted thoracoscopy  Left lower lobectomy and lymph node dissection  Placement of On-Q device  Surgeon: [name] MD   Stage IB ( pT2a,pN0,cM0)  Diagnosis 1. Lung, resection (segmental or lobe), Left lower lobe - INVASIVE ADENOCARCINOMA, POORLY DIFFERENTIATED, SPANNING 4.1 CM. - ADENOCARCINOMA INVOLVES PLEURA. - THE SURGICAL RESECTION MARGINS ARE NEGATIVE FOR ADENOCARCINOMA. - ONE BENIGN HILAR LYMPH NODE (0/1). - SEE ONCOLOGY TABLE BELOW. 2. Lymph node, biopsy, 8 L - THERE IS NO EVIDENCE OF CARCINOMA IN 1 OF 1 LYMPH NODE (0/1). 3. Lymph node, biopsy, 11 L - THERE IS NO EVIDENCE OF CARCINOMA IN 1 OF 1 LYMPH NODE (0/1). 4. Lymph node, biopsy, 12 L - THERE IS NO EVIDENCE OF CARCINOMA IN 1 OF 1 LYMPH NODE (0/1). 5. Lymph node, biopsy, 10 L - THERE IS NO EVIDENCE OF CARCINOMA IN 1 OF 1 LYMPH NODE (0/1). 6. Lymph node, biopsy, 4 L - THERE IS NO EVIDENCE OF CARCINOMA IN 1 OF 1 LYMPH NODE (0/1).  History of Present Illness:      Patient is making excellent progress following lobectomy especially considering her age of 34 years. She has completed course of chemo in OCT 2015She completed 4 cycles of adjuvant chemotherapy with navelbine and cisplatin , now is 16 months postop. Patient remains active.   Past Medical History  Diagnosis Date  . Neurologic disorder     2009  . Unspecified vitamin D deficiency   . Arthritis   . Thrombocytopenia, unspecified  02/26/2013  . Cough 02/26/2013  . Lung mass 02/27/2013  . Hypertension   . Shortness of breath     exertion  . Hyperthyroidism     took iodine treatment for this  . Benign hypertensive kidney disease with chronic kidney disease stage I through stage IV, or unspecified     sees dr. Justin Mend again in april  . Chronic kidney disease, stage III (moderate)   . Cancer     lung ca dx'd 02/2013  . Headache   . GERD (gastroesophageal reflux disease)      History  Smoking status  . Former Smoker -- 0.50 packs/day for 60 years  . Types: Cigarettes  . Quit date: 02/25/2012  Smokeless tobacco  . Never Used    History  Alcohol Use No     Allergies  Allergen Reactions  . Aspirin Other (See Comments)    REACTION: stomach upset  . Lactose Intolerance (Gi) Diarrhea and Other (See Comments)    "can't drink milk. It tears my stomach up."  . Adhesive [Tape] Rash  . Lisinopril Other (See Comments)    cough  . Percocet [Oxycodone-Acetaminophen] Itching    Current Outpatient Prescriptions  Medication Sig Dispense Refill  . amLODipine (NORVASC) 10 MG tablet Take 1 tablet (10 mg total) by mouth daily. 90 tablet 1  . chlorthalidone (HYGROTON) 25 MG tablet Take 1 tablet by mouth daily.  4  . Cholecalciferol (VITAMIN D-3) 1000 UNITS CAPS Take 1,000 Units by mouth daily.     . clopidogrel (PLAVIX) 75 MG tablet Take 75 mg by mouth daily with breakfast.     . magnesium oxide (MAG-OX) 400 MG tablet Take 400 mg by mouth daily.    . metoprolol succinate (TOPROL-XL) 25 MG 24 hr tablet Take 25 mg by mouth daily.     . Multiple Vitamin (MULTIVITAMIN) tablet Take 1 tablet by mouth at bedtime.     . TRAVATAN Z 0.004 % SOLN ophthalmic solution Apply 1 drop to eye daily. In affected eye  6  . valsartan (DIOVAN) 80 MG tablet Take 1 tablet (80 mg total) by mouth daily. (Patient taking differently: Take 80 mg by mouth at bedtime. )    . atorvastatin (LIPITOR) 10 MG tablet Take 10 mg by mouth daily at 6 PM.      No  current facility-administered medications for this visit.       Physical Exam: BP 140/72 mmHg  Pulse 63  Resp 20  Ht 5' (1.524 m)  Wt 174 lb (78.926 kg)  BMI 33.98 kg/m2  SpO2 96%  General appearance: alert and cooperative Neurologic: intact Heart: regular rate and rhythm, S1, S2 normal, no murmur, click, rub or gallop Lungs: clear to auscultation bilaterally Abdomen: soft, non-tender; bowel sounds normal; no masses,  no organomegaly Extremities: extremities normal, atraumatic, no cyanosis or edema and Homans sign is negative, no sign of DVT Wound: Her left chest incisions are well-healed   Diagnostic Studies & Laboratory data:     Recent Radiology Findings:  Dg Chest 2 View  07/03/2014   CLINICAL DATA:  Non-small-cell lung cancer.  Exclude recurrence  EXAM: CHEST  2 VIEW  COMPARISON:  05/06/2013.  Chest CT 12/31/2013  FINDINGS: Port-A-Cath has been removed.  ACDF C6-7.  The lungs are clear. Negative for pneumonia or effusion. No mass or adenopathy. Small density left lung base on the prior CT is not identified today.  IMPRESSION: No active cardiopulmonary disease.   Electronically Signed   By: Franchot Gallo M.D.   On: 07/03/2014 12:56   Ct Chest Wo Contrast  07/23/2014   CLINICAL DATA:  History of lung cancer, shortness of breath with exertion.  EXAM: CT CHEST WITHOUT CONTRAST  TECHNIQUE: Multidetector CT imaging of the chest was performed following the standard protocol without IV contrast.  COMPARISON:  12/31/2013.  FINDINGS: Mediastinum/Nodes: Left lobe of the thyroid is asymmetrically enlarged and contains a dominant nodule measuring approximately 3.0 cm, stable. Slight rightward deviation of the trachea. No pathologically enlarged mediastinal or axillary lymph nodes. Hilar regions are difficult to definitively evaluate without IV contrast. Atherosclerotic calcification of the arterial vasculature, including three-vessel involvement of the coronary arteries. Heart size normal. No  pericardial effusion.  Lungs/Pleura: Mild centrilobular emphysema. Postoperative changes of left lower lobectomy with associated scarring and volume loss in the lower left hemi thorax. Previously questioned sub solid left lower lobe nodule is likely due to scarring (series 4, image 32). Lungs are otherwise clear. No pleural fluid. Airway is otherwise unremarkable.  Upper abdomen: Low-attenuation lesions in the liver measure up to 7 mm in the right hepatic lobe, stable. Visualized portions of the liver, gallbladder, adrenal glands, kidneys, spleen and pancreas are otherwise unremarkable. Small hiatal hernia. Visualized portions of the stomach and bowel are otherwise grossly unremarkable. No upper abdominal adenopathy.  Musculoskeletal: No worrisome lytic or sclerotic lesions. Degenerative changes are seen in the spine.  IMPRESSION:  1. Left lower lobectomy without evidence of recurrent or metastatic disease. 2. Dominant left thyroid nodule, as before. Consider sonographic evaluation, as clinically indicated. 3. Three-vessel coronary artery calcification.   Electronically Signed   By: Lorin Picket M.D.   On: 07/23/2014 09:31   I have independently reviewed the above radiology studies  and reviewed the findings with the patient.   CLINICAL DATA: Lung cancer, completed chemotherapy. Shortness of breath.  EXAM: CT CHEST WITH CONTRAST  TECHNIQUE: Multidetector CT imaging of the chest was performed during intravenous contrast administration.  CONTRAST: 92m OMNIPAQUE IOHEXOL 300 MG/ML SOLN  COMPARISON: 07/01/2013  FINDINGS: 3.1 by 2.6 cm solid and partially calcified left thyroid nodule, stable.  Left subclavian Port-A-Cath tip: SVC.  Atherosclerotic aortic arch. Coronary artery atherosclerosis observed in the left anterior descending artery.  The trace left pleural effusion has nearly completely resolved. Focal pleural thickening or adjacent mild rounded atelectasis in the left  lower lung is reduced in size and prominence on image 35 of series 2. This currently measures up to 6 mm in thickness.  No pathologic thoracic adenopathy.  Left lower lobectomy.  Indistinct 6 by 7 mm by 4 mm ground-glass density in the left lung on image 32 of series 5 posteriorly in, stable.  Thoracic spondylosis. Lower cervical plate and screw fixator.  Centrilobular emphysema.  Small focus of enhancement along the left side of the lateral segment left hepatic lobe, image 48 of series 2, stable, not previously hypermetabolic on prior PET-CT, probably flash filling of a hemangioma or a small perfusion anomaly.  IMPRESSION: 1. No findings of recurrence or significant change. 6 by 7 by 4 mm slightly sub solid left posterior nodule merits observation. 2. Stable left thyroid nodule. This was not previously hypermetabolic. 3. Stable small enhancing focus in the lateral segment left hepatic lobe, probably a flash filling hemangioma or perfusion anomaly, not previously hypermetabolic. 4. Centrilobular emphysema. 5. Left lower lobectomy. Focal scarring posteriorly in the remaining lower posterior left lung, with reduced size of the trace pleural effusion. 6. Atherosclerosis.   Electronically Signed  By: WSherryl BartersM.D.  On: 12/31/2013 15:39  Recent Lab Findings: Lab Results  Component Value Date   WBC 5.2 07/03/2014   HGB 11.6 07/03/2014   HCT 34.6* 07/03/2014   PLT 250 07/03/2014   GLUCOSE 110 07/03/2014   ALT 20 07/03/2014   AST 28 07/03/2014   NA 140 07/03/2014   K 4.6 07/03/2014   CL 105 01/07/2014   CREATININE 1.9* 07/03/2014   BUN 35.3* 07/03/2014   CO2 19* 07/03/2014   INR 0.96 05/06/2013      Assessment / Plan:   Patient doing well following left lower lobectomy for a 4.2 cm adenocarcinoma stage IB 16 months ago, Last ct of chest was dec 2015- still has areas that need follow up,  Follow-up CT scan done today shows no evidence of  recurrence, she has a follow-up CT scan already ordered by oncology for December 2016 I plan to see her back in 6 months   EGrace IsaacMD      [address] [place] [id number] [phone]  Beeper [phone] 07/23/2014 12:54 PM

## 2014-08-24 ENCOUNTER — Other Ambulatory Visit: Payer: Self-pay

## 2014-08-24 DIAGNOSIS — Z1231 Encounter for screening mammogram for malignant neoplasm of breast: Secondary | ICD-10-CM

## 2014-09-23 ENCOUNTER — Ambulatory Visit: Payer: Medicare Other

## 2014-09-25 ENCOUNTER — Ambulatory Visit
Admission: RE | Admit: 2014-09-25 | Discharge: 2014-09-25 | Disposition: A | Discharge status: Home / Self Care | Payer: Medicare Other | Source: Ambulatory Visit

## 2014-09-25 DIAGNOSIS — Z1231 Encounter for screening mammogram for malignant neoplasm of breast: Secondary | ICD-10-CM | POA: Diagnosis not present

## 2014-10-14 DIAGNOSIS — I129 Hypertensive chronic kidney disease with stage 1 through stage 4 chronic kidney disease, or unspecified chronic kidney disease: Secondary | ICD-10-CM | POA: Diagnosis not present

## 2014-10-14 DIAGNOSIS — Z23 Encounter for immunization: Secondary | ICD-10-CM | POA: Diagnosis not present

## 2014-10-14 DIAGNOSIS — Z87891 Personal history of nicotine dependence: Secondary | ICD-10-CM | POA: Diagnosis not present

## 2014-10-14 DIAGNOSIS — D0220 Carcinoma in situ of unspecified bronchus and lung: Secondary | ICD-10-CM | POA: Diagnosis not present

## 2014-10-14 DIAGNOSIS — N183 Chronic kidney disease, stage 3 (moderate): Secondary | ICD-10-CM | POA: Diagnosis not present

## 2014-10-28 DIAGNOSIS — H402232 Chronic angle-closure glaucoma, bilateral, moderate stage: Secondary | ICD-10-CM | POA: Diagnosis not present

## 2014-12-24 DIAGNOSIS — I129 Hypertensive chronic kidney disease with stage 1 through stage 4 chronic kidney disease, or unspecified chronic kidney disease: Secondary | ICD-10-CM | POA: Diagnosis not present

## 2014-12-24 DIAGNOSIS — Z23 Encounter for immunization: Secondary | ICD-10-CM | POA: Diagnosis not present

## 2014-12-24 DIAGNOSIS — N183 Chronic kidney disease, stage 3 (moderate): Secondary | ICD-10-CM | POA: Diagnosis not present

## 2014-12-24 DIAGNOSIS — D0220 Carcinoma in situ of unspecified bronchus and lung: Secondary | ICD-10-CM | POA: Diagnosis not present

## 2014-12-31 ENCOUNTER — Encounter (HOSPITAL_COMMUNITY): Payer: Self-pay

## 2014-12-31 ENCOUNTER — Ambulatory Visit (HOSPITAL_COMMUNITY)
Admission: RE | Admit: 2014-12-31 | Discharge: 2014-12-31 | Disposition: A | Discharge status: Home / Self Care | Payer: Medicare Other | Source: Ambulatory Visit | Attending: Hematology and Oncology | Admitting: Hematology and Oncology

## 2014-12-31 ENCOUNTER — Other Ambulatory Visit (HOSPITAL_BASED_OUTPATIENT_CLINIC_OR_DEPARTMENT_OTHER): Payer: Medicare Other

## 2014-12-31 DIAGNOSIS — E041 Nontoxic single thyroid nodule: Secondary | ICD-10-CM | POA: Insufficient documentation

## 2014-12-31 DIAGNOSIS — I7090 Unspecified atherosclerosis: Secondary | ICD-10-CM | POA: Diagnosis not present

## 2014-12-31 DIAGNOSIS — C3492 Malignant neoplasm of unspecified part of left bronchus or lung: Secondary | ICD-10-CM | POA: Diagnosis not present

## 2014-12-31 DIAGNOSIS — R911 Solitary pulmonary nodule: Secondary | ICD-10-CM | POA: Diagnosis not present

## 2014-12-31 DIAGNOSIS — J439 Emphysema, unspecified: Secondary | ICD-10-CM | POA: Insufficient documentation

## 2014-12-31 DIAGNOSIS — Z902 Acquired absence of lung [part of]: Secondary | ICD-10-CM | POA: Diagnosis not present

## 2014-12-31 DIAGNOSIS — I2510 Atherosclerotic heart disease of native coronary artery without angina pectoris: Secondary | ICD-10-CM | POA: Insufficient documentation

## 2014-12-31 LAB — CBC WITH DIFFERENTIAL/PLATELET
BASO%: 0.9 % (ref 0.0–2.0)
BASOS ABS: 0 10*3/uL (ref 0.0–0.1)
EOS%: 2.9 % (ref 0.0–7.0)
Eosinophils Absolute: 0.2 10*3/uL (ref 0.0–0.5)
HEMATOCRIT: 34.6 % — AB (ref 34.8–46.6)
HGB: 11.4 g/dL — ABNORMAL LOW (ref 11.6–15.9)
LYMPH%: 22 % (ref 14.0–49.7)
MCH: 31 pg (ref 25.1–34.0)
MCHC: 32.8 g/dL (ref 31.5–36.0)
MCV: 94.4 fL (ref 79.5–101.0)
MONO#: 1 10*3/uL — AB (ref 0.1–0.9)
MONO%: 17.5 % — ABNORMAL HIGH (ref 0.0–14.0)
NEUT#: 3.2 10*3/uL (ref 1.5–6.5)
NEUT%: 56.7 % (ref 38.4–76.8)
PLATELETS: 261 10*3/uL (ref 145–400)
RBC: 3.67 10*6/uL — AB (ref 3.70–5.45)
RDW: 15.1 % — ABNORMAL HIGH (ref 11.2–14.5)
WBC: 5.6 10*3/uL (ref 3.9–10.3)
lymph#: 1.2 10*3/uL (ref 0.9–3.3)

## 2014-12-31 LAB — COMPREHENSIVE METABOLIC PANEL
ALK PHOS: 80 U/L (ref 40–150)
ALT: 18 U/L (ref 0–55)
ANION GAP: 11 meq/L (ref 3–11)
AST: 24 U/L (ref 5–34)
Albumin: 3.8 g/dL (ref 3.5–5.0)
BUN: 21.8 mg/dL (ref 7.0–26.0)
CALCIUM: 9.2 mg/dL (ref 8.4–10.4)
CHLORIDE: 109 meq/L (ref 98–109)
CO2: 23 meq/L (ref 22–29)
CREATININE: 1.7 mg/dL — AB (ref 0.6–1.1)
EGFR: 34 mL/min/{1.73_m2} — AB (ref >90)
Glucose: 89 mg/dl (ref 70–140)
POTASSIUM: 4.4 meq/L (ref 3.5–5.1)
Sodium: 143 mEq/L (ref 136–145)
Total Bilirubin: <0.3 mg/dL (ref 0.20–1.20)
Total Protein: 7 g/dL (ref 6.4–8.3)

## 2015-01-01 ENCOUNTER — Telehealth: Payer: Self-pay | Admitting: Hematology and Oncology

## 2015-01-01 ENCOUNTER — Ambulatory Visit (HOSPITAL_BASED_OUTPATIENT_CLINIC_OR_DEPARTMENT_OTHER): Payer: Medicare Other | Admitting: Hematology and Oncology

## 2015-01-01 ENCOUNTER — Encounter: Payer: Self-pay | Admitting: Hematology and Oncology

## 2015-01-01 VITALS — BP 138/66 | HR 67 | Temp 97.5°F | Resp 18 | Ht 60.0 in | Wt 176.5 lb

## 2015-01-01 DIAGNOSIS — Z85118 Personal history of other malignant neoplasm of bronchus and lung: Secondary | ICD-10-CM

## 2015-01-01 DIAGNOSIS — D631 Anemia in chronic kidney disease: Secondary | ICD-10-CM

## 2015-01-01 DIAGNOSIS — I129 Hypertensive chronic kidney disease with stage 1 through stage 4 chronic kidney disease, or unspecified chronic kidney disease: Secondary | ICD-10-CM | POA: Diagnosis not present

## 2015-01-01 DIAGNOSIS — I10 Essential (primary) hypertension: Secondary | ICD-10-CM

## 2015-01-01 DIAGNOSIS — N189 Chronic kidney disease, unspecified: Secondary | ICD-10-CM

## 2015-01-01 DIAGNOSIS — E041 Nontoxic single thyroid nodule: Secondary | ICD-10-CM | POA: Insufficient documentation

## 2015-01-01 DIAGNOSIS — N184 Chronic kidney disease, stage 4 (severe): Secondary | ICD-10-CM | POA: Diagnosis not present

## 2015-01-01 NOTE — Assessment & Plan Note (Signed)
Her blood pressure is under good control  she will continue current medical management. I recommend close follow-up with primary care doctor for medication adjustment.

## 2015-01-01 NOTE — Telephone Encounter (Signed)
per pof tosch pt appt-gave pt copy of avs-adv [facility] willc call to [facility]

## 2015-01-01 NOTE — Assessment & Plan Note (Signed)
She have chronic kidney disease stage IV, related to side effects of chemotherapy and chronic hypertension. Her Kidney function is stable. Continue aggressive risk factor modification.

## 2015-01-01 NOTE — Assessment & Plan Note (Signed)
This is likely anemia of chronic disease. The patient denies recent history of bleeding such as epistaxis, hematuria or hematochezia. She is asymptomatic from the anemia. We will observe for now.  

## 2015-01-01 NOTE — Assessment & Plan Note (Signed)
She is doing well with no evidence of disease recurrence. I will see her back in 6 months with repeat blood work, history, physical examination and chest CT

## 2015-01-01 NOTE — Progress Notes (Signed)
[facility] OFFICE PROGRESS NOTE  Patient Care Team: Glendale Chard, MD as PCP - General (Internal Medicine) Heath Lark, MD as Consulting Physician (Hematology and Oncology) [name], MD as Consulting Physician (Cardiothoracic Surgery)  SUMMARY OF ONCOLOGIC HISTORY: Oncology History   Non-small cell lung cancer, adenocarcinoma   Primary site: Lung (Left)   Staging method: AJCC 7th Edition   Clinical: Stage IB (T2a, N0, M0) signed by Heath Lark, MD on 04/29/2013  9:01 PM   Pathologic: Stage IB (T2a, N0, cM0) signed by [name], MD on 04/09/2013 12:51 PM   Summary: Stage IB (T2a, N0, cM0)       History of lung cancer   02/26/2013 Imaging CXR for evaluation of cough showed new left lung mass   02/28/2013 Imaging CT chest showed left new lung nodule   03/14/2013 Imaging PET/CT scan showed localized disease in the left lung   04/07/2013 Surgery She underwent bronchoscopy and left lower lobe resection and lymph node sampling with negative margins   05/06/2013 Procedure The patient has placement of Infuse-a-Port   05/21/2013 - 07/30/2013 Chemotherapy She completed 4 cycles of adjuvant chemotherapy with navelbine and cisplatin   07/01/2013 Imaging CT scan of the chest shows stable appearance.   07/02/2013 Adverse Reaction Dose of chemotherapy was adjusted further due to severe anemia.   12/31/2013 Imaging CT scan of the chest show no evidence of disease recurrence   07/03/2014 Imaging Chest x-ray looks normal   12/31/2014 Imaging Ct chest showed no evidence of recurrence    INTERVAL HISTORY: Please see below for problem oriented charting. She is doing well. Denies recent infection. No cough. She continues to have intermittent incisional pain, stable.  REVIEW OF SYSTEMS:   Constitutional: Denies fevers, chills or abnormal weight loss Eyes: Denies blurriness of vision Ears, nose, mouth, throat, and face: Denies mucositis or sore throat Respiratory: Denies cough, dyspnea  or wheezes Cardiovascular: Denies palpitation, chest discomfort or lower extremity swelling Gastrointestinal:  Denies nausea, heartburn or change in bowel habits Skin: Denies abnormal skin rashes Lymphatics: Denies new lymphadenopathy or easy bruising Neurological:Denies numbness, tingling or new weaknesses Behavioral/Psych: Mood is stable, no new changes  All other systems were reviewed with the patient and are negative.  I have reviewed the past medical history, past surgical history, social history and family history with the patient and they are unchanged from previous note.  ALLERGIES:  is allergic to aspirin; lactose intolerance (gi); adhesive; lisinopril; and percocet.  MEDICATIONS:  Current Outpatient Prescriptions  Medication Sig Dispense Refill  . amLODipine (NORVASC) 10 MG tablet Take 1 tablet (10 mg total) by mouth daily. 90 tablet 1  . atorvastatin (LIPITOR) 10 MG tablet Take 10 mg by mouth daily at 6 PM.     . chlorthalidone (HYGROTON) 25 MG tablet Take 1 tablet by mouth daily.  4  . Cholecalciferol (VITAMIN D-3) 1000 UNITS CAPS Take 1,000 Units by mouth daily.     . clopidogrel (PLAVIX) 75 MG tablet Take 75 mg by mouth daily with breakfast.     . magnesium oxide (MAG-OX) 400 MG tablet Take 400 mg by mouth daily.    . metoprolol succinate (TOPROL-XL) 25 MG 24 hr tablet Take 25 mg by mouth daily.     . Multiple Vitamin (MULTIVITAMIN) tablet Take 1 tablet by mouth at bedtime.     . TRAVATAN Z 0.004 % SOLN ophthalmic solution Apply 1 drop to eye daily. In affected eye  6  . valsartan (DIOVAN)  80 MG tablet Take 1 tablet (80 mg total) by mouth daily. (Patient taking differently: Take 80 mg by mouth at bedtime. )     No current facility-administered medications for this visit.    PHYSICAL EXAMINATION: ECOG PERFORMANCE STATUS: 0 - Asymptomatic  Filed Vitals:   01/01/15 1135  BP: 138/66  Pulse: 67  Temp: 97.5 F (36.4 C)  Resp: 18   Filed Weights   01/01/15 1135   Weight: 176 lb 8 oz (80.06 kg)    GENERAL:alert, no distress and comfortable SKIN: skin color, texture, turgor are normal, no rashes or significant lesions EYES: normal, Conjunctiva are pink and non-injected, sclera clear OROPHARYNX:no exudate, no erythema and lips, buccal mucosa, and tongue normal  NECK: supple, thyroid normal size, non-tender, without nodularity LYMPH:  no palpable lymphadenopathy in the cervical, axillary or inguinal LUNGS: clear to auscultation and percussion with normal breathing effort HEART: regular rate & rhythm and no murmurs and no lower extremity edema ABDOMEN:abdomen soft, non-tender and normal bowel sounds Musculoskeletal:no cyanosis of digits and no clubbing  NEURO: alert & oriented x 3 with fluent speech, no focal motor/sensory deficits  LABORATORY DATA:  I have reviewed the data as listed    Component Value Date/Time   NA 143 12/31/2014 1141   NA 138 01/07/2014 1239   K 4.4 12/31/2014 1141   K 4.7 01/07/2014 1239   CL 105 01/07/2014 1239   CO2 23 12/31/2014 1141   CO2 21 01/07/2014 1239   GLUCOSE 89 12/31/2014 1141   GLUCOSE 90 01/07/2014 1239   BUN 21.8 12/31/2014 1141   BUN 17 01/07/2014 1239   CREATININE 1.7* 12/31/2014 1141   CREATININE 1.33* 01/07/2014 1239   CALCIUM 9.2 12/31/2014 1141   CALCIUM 9.7 01/07/2014 1239   PROT 7.0 12/31/2014 1141   PROT 6.5 05/06/2013 0619   ALBUMIN 3.8 12/31/2014 1141   ALBUMIN 3.5 05/06/2013 0619   AST 24 12/31/2014 1141   AST 24 05/06/2013 0619   ALT 18 12/31/2014 1141   ALT 17 05/06/2013 0619   ALKPHOS 80 12/31/2014 1141   ALKPHOS 70 05/06/2013 0619   BILITOT <0.30 12/31/2014 1141   BILITOT <0.2* 05/06/2013 0619   GFRNONAA 38* 01/07/2014 1239   GFRAA 44* 01/07/2014 1239    No results found for: SPEP, UPEP  Lab Results  Component Value Date   WBC 5.6 12/31/2014   NEUTROABS 3.2 12/31/2014   HGB 11.4* 12/31/2014   HCT 34.6* 12/31/2014   MCV 94.4 12/31/2014   PLT 261 12/31/2014       Chemistry      Component Value Date/Time   NA 143 12/31/2014 1141   NA 138 01/07/2014 1239   K 4.4 12/31/2014 1141   K 4.7 01/07/2014 1239   CL 105 01/07/2014 1239   CO2 23 12/31/2014 1141   CO2 21 01/07/2014 1239   BUN 21.8 12/31/2014 1141   BUN 17 01/07/2014 1239   CREATININE 1.7* 12/31/2014 1141   CREATININE 1.33* 01/07/2014 1239      Component Value Date/Time   CALCIUM 9.2 12/31/2014 1141   CALCIUM 9.7 01/07/2014 1239   ALKPHOS 80 12/31/2014 1141   ALKPHOS 70 05/06/2013 0619   AST 24 12/31/2014 1141   AST 24 05/06/2013 0619   ALT 18 12/31/2014 1141   ALT 17 05/06/2013 0619   BILITOT <0.30 12/31/2014 1141   BILITOT <0.2* 05/06/2013 7062       RADIOGRAPHIC STUDIES:I reviewed the imaging with the patient and her daughter I have  personally reviewed the radiological images as listed and agreed with the findings in the report. Ct Chest Wo Contrast  12/31/2014  CLINICAL DATA:  Subsequent evaluation of a 79 year old female with history of non-small cell carcinoma both the left lower lobe diagnosed in January 2015, status post left lower lobectomy and chemotherapy (completed). EXAM: CT CHEST WITHOUT CONTRAST TECHNIQUE: Multidetector CT imaging of the chest was performed following the standard protocol without IV contrast. COMPARISON:  Chest CT 07/23/2014. FINDINGS: Mediastinum/Lymph Nodes: Heart size is normal. There is no significant pericardial fluid, thickening or pericardial calcification. There is atherosclerosis of the thoracic aorta, the great vessels of the mediastinum and the coronary arteries, including calcified atherosclerotic plaque in the left anterior descending, left circumflex and right coronary arteries. No pathologically enlarged mediastinal or hilar lymph nodes. Please note that accurate exclusion of hilar adenopathy is limited on noncontrast CT scans. Asymmetrically enlarged left lobe of thyroid gland which appears to be occupied by a dominant partially calcified  nodule, which is slightly larger than the prior examination, measuring 2.8 x 3.2 cm on today's study (image 8 of series 2). Small hiatal hernia. No axillary lymphadenopathy. Lungs/Pleura: Status post left lower lobectomy. Compensatory hyperexpansion of the left upper lobe. Small amount of peripheral pleuroparenchymal thickening in the inferior aspect of the left upper lobe, similar to the prior study, most compatible with some mild chronic scarring. No suspicious appearing pulmonary nodules or masses. No acute consolidative airspace disease. No pleural effusions. Mild diffuse bronchial wall thickening with mild centrilobular and paraseptal emphysema. Upper Abdomen: Atherosclerosis. 4 mm low-attenuation lesion in segment 2 of the liver again noted, incompletely characterized on today's noncontrast CT examination, but likely a tiny cyst. Musculoskeletal/Soft Tissues: Orthopedic fixation hardware in the lower cervical spine incompletely imaged. There are no aggressive appearing lytic or blastic lesions noted in the visualized portions of the skeleton. IMPRESSION: 1. Postoperative changes of left lower lobectomy. No findings to suggest recurrent or metastatic disease in the thorax. 2. Slight enlargement of a heterogeneous appearing nodule in the left lobe of the thyroid gland which is partially calcified measuring 2.8 x 3.2 cm on today's study. Further evaluation with nonemergent thyroid ultrasound is recommended at this time. 3. Mild diffuse bronchial wall thickening with mild centrilobular and paraseptal emphysema; imaging findings suggestive of underlying COPD. 4. Atherosclerosis, including three-vessel coronary artery disease. Assessment for potential risk factor modification, dietary therapy or pharmacologic therapy may be warranted, if clinically indicated. Electronically Signed   By: Vinnie Langton M.D.   On: 12/31/2014 14:54     ASSESSMENT & PLAN:  History of lung cancer She is doing well with no evidence  of disease recurrence. I will see her back in 6 months with repeat blood work, history, physical examination and chest CT    Essential hypertension, benign Her blood pressure is under good control  she will continue current medical management. I recommend close follow-up with primary care doctor for medication adjustment.   Benign hypertension with CKD (chronic kidney disease) stage IV (HCC) She have chronic kidney disease stage IV, related to side effects of chemotherapy and chronic hypertension. Her Kidney function is stable. Continue aggressive risk factor modification.  Anemia in chronic kidney disease This is likely anemia of chronic disease. The patient denies recent history of bleeding such as epistaxis, hematuria or hematochezia. She is asymptomatic from the anemia. We will observe for now.  Left thyroid nodule She has chronic left thyroid nodule. She had fine-needle aspirate performed in 2011 which was benign.  It is partially calcified. No further workup is needed   Orders Placed This Encounter  Procedures  . CT Chest Wo Contrast    Standing Status: Future     Number of Occurrences:      Standing Expiration Date: 03/02/2016    Order Specific Question:  Reason for Exam (SYMPTOM  OR DIAGNOSIS REQUIRED)    Answer:  resection of lung ca, exclude recurrence    Order Specific Question:  Preferred imaging location?    Answer:  [facility]  . CBC with Differential/Platelet    Standing Status: Future     Number of Occurrences:      Standing Expiration Date: 03/02/2016  . Comprehensive metabolic panel    Standing Status: Future     Number of Occurrences:      Standing Expiration Date: 03/02/2016   All questions were answered. The patient knows to call the clinic with any problems, questions or concerns. No barriers to learning was detected. I spent 15 minutes counseling the patient face to face. The total time spent in the appointment was 20 minutes and more than 50%  was on counseling and review of test results     [facility], [place], MD 01/01/2015 12:32 PM

## 2015-01-01 NOTE — Assessment & Plan Note (Signed)
She has chronic left thyroid nodule. She had fine-needle aspirate performed in 2011 which was benign. It is partially calcified. No further workup is needed

## 2015-01-22 DIAGNOSIS — Z0189 Encounter for other specified special examinations: Secondary | ICD-10-CM | POA: Diagnosis not present

## 2015-01-22 DIAGNOSIS — I10 Essential (primary) hypertension: Secondary | ICD-10-CM | POA: Diagnosis not present

## 2015-01-22 DIAGNOSIS — Z79899 Other long term (current) drug therapy: Secondary | ICD-10-CM | POA: Diagnosis not present

## 2015-01-22 DIAGNOSIS — G4700 Insomnia, unspecified: Secondary | ICD-10-CM | POA: Diagnosis not present

## 2015-01-22 DIAGNOSIS — E782 Mixed hyperlipidemia: Secondary | ICD-10-CM | POA: Diagnosis not present

## 2015-01-22 DIAGNOSIS — H9193 Unspecified hearing loss, bilateral: Secondary | ICD-10-CM | POA: Diagnosis not present

## 2015-01-28 ENCOUNTER — Encounter: Payer: Self-pay | Admitting: Cardiothoracic Surgery

## 2015-01-28 ENCOUNTER — Ambulatory Visit (INDEPENDENT_AMBULATORY_CARE_PROVIDER_SITE_OTHER): Payer: Medicare Other | Admitting: Cardiothoracic Surgery

## 2015-01-28 VITALS — BP 150/60 | HR 67 | Resp 20 | Ht 60.0 in | Wt 176.0 lb

## 2015-01-28 DIAGNOSIS — C3492 Malignant neoplasm of unspecified part of left bronchus or lung: Secondary | ICD-10-CM | POA: Diagnosis not present

## 2015-01-28 DIAGNOSIS — Z902 Acquired absence of lung [part of]: Secondary | ICD-10-CM | POA: Diagnosis not present

## 2015-01-28 NOTE — Progress Notes (Signed)
[address]       [place] [address]             [phone]      [name] [name] Medical Record #[id number] Date of Birth: 05/09/1935  Referring: Tanda Rockers, MD Primary Care: Maximino Greenland, MD  Chief Complaint:   POST OP FOLLOW UP 04/07/2013  OPERATIVE REPORT  Preop diagnosis: Left lower lobe lung mass suspicious for primary carcinoma the lung  Postop diagnosis: Same, non-small cell lung cancer probably adenocarcinoma by frozen section  Procedure:  Bronchoscopy  Left video-assisted thoracoscopy  Left lower lobectomy and lymph node dissection  Placement of On-Q device  Surgeon: [name] MD   Stage IB ( pT2a,pN0,cM0)  Diagnosis 1. Lung, resection (segmental or lobe), Left lower lobe - INVASIVE ADENOCARCINOMA, POORLY DIFFERENTIATED, SPANNING 4.1 CM. - ADENOCARCINOMA INVOLVES PLEURA. - THE SURGICAL RESECTION MARGINS ARE NEGATIVE FOR ADENOCARCINOMA. - ONE BENIGN HILAR LYMPH NODE (0/1). - SEE ONCOLOGY TABLE BELOW. 2. Lymph node, biopsy, 8 L - THERE IS NO EVIDENCE OF CARCINOMA IN 1 OF 1 LYMPH NODE (0/1). 3. Lymph node, biopsy, 11 L - THERE IS NO EVIDENCE OF CARCINOMA IN 1 OF 1 LYMPH NODE (0/1). 4. Lymph node, biopsy, 12 L - THERE IS NO EVIDENCE OF CARCINOMA IN 1 OF 1 LYMPH NODE (0/1). 5. Lymph node, biopsy, 10 L - THERE IS NO EVIDENCE OF CARCINOMA IN 1 OF 1 LYMPH NODE (0/1). 6. Lymph node, biopsy, 4 L - THERE IS NO EVIDENCE OF CARCINOMA IN 1 OF 1 LYMPH NODE (0/1).  History of Present Illness:      Patient is making excellent progress following lobectomy especially considering her age of 80 years. She has completed course of chemo in OCT 2015She completed 4 cycles of adjuvant chemotherapy with navelbine and cisplatin , now is 16 months postop. Patient remains active.   Past Medical History  Diagnosis Date  . Neurologic disorder     2009  . Unspecified vitamin D deficiency   . Arthritis   . Thrombocytopenia, unspecified  ([place]) 02/26/2013  . Cough 02/26/2013  . Lung mass 02/27/2013  . Hypertension   . Shortness of breath     exertion  . Hyperthyroidism     took iodine treatment for this  . Benign hypertensive kidney disease with chronic kidney disease stage I through stage IV, or unspecified     sees dr. Justin Mend again in april  . Chronic kidney disease, stage III (moderate)   . Cancer ([place])     lung ca dx'd 02/2013  . Headache   . GERD (gastroesophageal reflux disease)      History  Smoking status  . Former Smoker -- 0.50 packs/day for 60 years  . Types: Cigarettes  . Quit date: 02/25/2012  Smokeless tobacco  . Never Used    History  Alcohol Use No     Allergies  Allergen Reactions  . Aspirin Other (See Comments)    REACTION: stomach upset  . Lactose Intolerance (Gi) Diarrhea and Other (See Comments)    "can't drink milk. It tears my stomach up."  . Adhesive [Tape] Rash  . Lisinopril Other (See Comments)    cough  . Percocet [Oxycodone-Acetaminophen] Itching    Current Outpatient Prescriptions  Medication Sig Dispense Refill  . amLODipine (NORVASC) 10 MG tablet Take 1 tablet (10 mg total) by mouth daily. 90 tablet 1  . atorvastatin (LIPITOR) 10 MG tablet Take 10 mg by mouth  daily at 6 PM.     . chlorthalidone (HYGROTON) 25 MG tablet Take 1 tablet by mouth daily.  4  . Cholecalciferol (VITAMIN D-3) 1000 UNITS CAPS Take 1,000 Units by mouth daily.     . clopidogrel (PLAVIX) 75 MG tablet Take 75 mg by mouth daily with breakfast.     . magnesium oxide (MAG-OX) 400 MG tablet Take 400 mg by mouth daily.    . metoprolol succinate (TOPROL-XL) 25 MG 24 hr tablet Take 25 mg by mouth daily.     . Multiple Vitamin (MULTIVITAMIN) tablet Take 1 tablet by mouth at bedtime.     . TRAVATAN Z 0.004 % SOLN ophthalmic solution Apply 1 drop to eye daily. In affected eye  6  . valsartan (DIOVAN) 80 MG tablet Take 1 tablet (80 mg total) by mouth daily. (Patient taking differently: Take 80 mg by mouth at bedtime.  )     No current facility-administered medications for this visit.       Physical Exam: There were no vitals taken for this visit.  General appearance: alert and cooperative Neurologic: intact Heart: regular rate and rhythm, S1, S2 normal, no murmur, click, rub or gallop Lungs: clear to auscultation bilaterally Abdomen: soft, non-tender; bowel sounds normal; no masses,  no organomegaly Extremities: extremities normal, atraumatic, no cyanosis or edema and Homans sign is negative, no sign of DVT Wound: Her left chest incisions are well-healed   Diagnostic Studies & Laboratory data:     Recent Radiology Findings:  Ct Chest Wo Contrast  12/31/2014  CLINICAL DATA:  Subsequent evaluation of a 80 year old female with history of non-small cell carcinoma both the left lower lobe diagnosed in January 2015, status post left lower lobectomy and chemotherapy (completed). EXAM: CT CHEST WITHOUT CONTRAST TECHNIQUE: Multidetector CT imaging of the chest was performed following the standard protocol without IV contrast. COMPARISON:  Chest CT 07/23/2014. FINDINGS: Mediastinum/Lymph Nodes: Heart size is normal. There is no significant pericardial fluid, thickening or pericardial calcification. There is atherosclerosis of the thoracic aorta, the great vessels of the mediastinum and the coronary arteries, including calcified atherosclerotic plaque in the left anterior descending, left circumflex and right coronary arteries. No pathologically enlarged mediastinal or hilar lymph nodes. Please note that accurate exclusion of hilar adenopathy is limited on noncontrast CT scans. Asymmetrically enlarged left lobe of thyroid gland which appears to be occupied by a dominant partially calcified nodule, which is slightly larger than the prior examination, measuring 2.8 x 3.2 cm on today's study (image 8 of series 2). Small hiatal hernia. No axillary lymphadenopathy. Lungs/Pleura: Status post left lower lobectomy.  Compensatory hyperexpansion of the left upper lobe. Small amount of peripheral pleuroparenchymal thickening in the inferior aspect of the left upper lobe, similar to the prior study, most compatible with some mild chronic scarring. No suspicious appearing pulmonary nodules or masses. No acute consolidative airspace disease. No pleural effusions. Mild diffuse bronchial wall thickening with mild centrilobular and paraseptal emphysema. Upper Abdomen: Atherosclerosis. 4 mm low-attenuation lesion in segment 2 of the liver again noted, incompletely characterized on today's noncontrast CT examination, but likely a tiny cyst. Musculoskeletal/Soft Tissues: Orthopedic fixation hardware in the lower cervical spine incompletely imaged. There are no aggressive appearing lytic or blastic lesions noted in the visualized portions of the skeleton. IMPRESSION: 1. Postoperative changes of left lower lobectomy. No findings to suggest recurrent or metastatic disease in the thorax. 2. Slight enlargement of a heterogeneous appearing nodule in the left lobe of the thyroid gland which  is partially calcified measuring 2.8 x 3.2 cm on today's study. Further evaluation with nonemergent thyroid ultrasound is recommended at this time. 3. Mild diffuse bronchial wall thickening with mild centrilobular and paraseptal emphysema; imaging findings suggestive of underlying COPD. 4. Atherosclerosis, including three-vessel coronary artery disease. Assessment for potential risk factor modification, dietary therapy or pharmacologic therapy may be warranted, if clinically indicated. Electronically Signed   By: Vinnie Langton M.D.   On: 12/31/2014 14:54   I have independently reviewed the above radiology studies  and reviewed the findings with the patient.   CLINICAL DATA: Lung cancer, completed chemotherapy. Shortness of breath.  EXAM: CT CHEST WITH CONTRAST  TECHNIQUE: Multidetector CT imaging of the chest was performed during intravenous  contrast administration.  CONTRAST: 40m OMNIPAQUE IOHEXOL 300 MG/ML SOLN  COMPARISON: 07/01/2013  FINDINGS: 3.1 by 2.6 cm solid and partially calcified left thyroid nodule, stable.  Left subclavian Port-A-Cath tip: SVC.  Atherosclerotic aortic arch. Coronary artery atherosclerosis observed in the left anterior descending artery.  The trace left pleural effusion has nearly completely resolved. Focal pleural thickening or adjacent mild rounded atelectasis in the left lower lung is reduced in size and prominence on image 35 of series 2. This currently measures up to 6 mm in thickness.  No pathologic thoracic adenopathy.  Left lower lobectomy.  Indistinct 6 by 7 mm by 4 mm ground-glass density in the left lung on image 32 of series 5 posteriorly in, stable.  Thoracic spondylosis. Lower cervical plate and screw fixator.  Centrilobular emphysema.  Small focus of enhancement along the left side of the lateral segment left hepatic lobe, image 48 of series 2, stable, not previously hypermetabolic on prior PET-CT, probably flash filling of a hemangioma or a small perfusion anomaly.  IMPRESSION: 1. No findings of recurrence or significant change. 6 by 7 by 4 mm slightly sub solid left posterior nodule merits observation. 2. Stable left thyroid nodule. This was not previously hypermetabolic. 3. Stable small enhancing focus in the lateral segment left hepatic lobe, probably a flash filling hemangioma or perfusion anomaly, not previously hypermetabolic. 4. Centrilobular emphysema. 5. Left lower lobectomy. Focal scarring posteriorly in the remaining lower posterior left lung, with reduced size of the trace pleural effusion. 6. Atherosclerosis.   Electronically Signed  By: WSherryl BartersM.D.  On: 12/31/2013 15:39  Recent Lab Findings: Lab Results  Component Value Date   WBC 5.6 12/31/2014   HGB 11.4* 12/31/2014   HCT 34.6* 12/31/2014   PLT 261  12/31/2014   GLUCOSE 89 12/31/2014   ALT 18 12/31/2014   AST 24 12/31/2014   NA 143 12/31/2014   K 4.4 12/31/2014   CL 105 01/07/2014   CREATININE 1.7* 12/31/2014   BUN 21.8 12/31/2014   CO2 23 12/31/2014   INR 0.96 05/06/2013      Assessment / Plan:   Patient doing well following left lower lobectomy for a 4.2 cm adenocarcinoma stage IB 20 months ago, Slight enlargement of a heterogeneous appearing nodule in the left lobe of the thyroid gland which is partially calcified measuring 2.8 x 3.2 cm  Further evaluation with nonemergent thyroid ultrasound-patient has no known multinodular goiter with previous scan done in 2011-she is referred back to primary care for evaluation Follow-up CT scan done today shows no evidence of recurrence, she has a follow-up CT scan already ordered by oncology for 6 months I plan to see her back in 6 months   EGrace IsaacMD      [phone]  [address] [place] [address] Office [phone]   Beeper [phone]  01/28/2015 11:13 AM

## 2015-02-04 DIAGNOSIS — R635 Abnormal weight gain: Secondary | ICD-10-CM | POA: Diagnosis not present

## 2015-02-04 DIAGNOSIS — E048 Other specified nontoxic goiter: Secondary | ICD-10-CM | POA: Diagnosis not present

## 2015-02-04 DIAGNOSIS — Z79899 Other long term (current) drug therapy: Secondary | ICD-10-CM | POA: Diagnosis not present

## 2015-02-04 DIAGNOSIS — Z85110 Personal history of malignant carcinoid tumor of bronchus and lung: Secondary | ICD-10-CM | POA: Diagnosis not present

## 2015-02-23 DIAGNOSIS — H402232 Chronic angle-closure glaucoma, bilateral, moderate stage: Secondary | ICD-10-CM | POA: Diagnosis not present

## 2015-02-23 DIAGNOSIS — H2513 Age-related nuclear cataract, bilateral: Secondary | ICD-10-CM | POA: Diagnosis not present

## 2015-03-18 DIAGNOSIS — N183 Chronic kidney disease, stage 3 (moderate): Secondary | ICD-10-CM | POA: Diagnosis not present

## 2015-03-18 DIAGNOSIS — E041 Nontoxic single thyroid nodule: Secondary | ICD-10-CM | POA: Diagnosis not present

## 2015-03-18 DIAGNOSIS — R7309 Other abnormal glucose: Secondary | ICD-10-CM | POA: Diagnosis not present

## 2015-03-18 DIAGNOSIS — I129 Hypertensive chronic kidney disease with stage 1 through stage 4 chronic kidney disease, or unspecified chronic kidney disease: Secondary | ICD-10-CM | POA: Diagnosis not present

## 2015-04-18 IMAGING — CT CT CHEST W/ CM
2 of 3 series · 15 of 36 positions shown, 18 images · IV contrast (OMNIPAQUE)
Comparison: 02/26/2013

CLINICAL DATA: Abnormal chest radiograph

EXAM:
CT CHEST WITH CONTRAST
TECHNIQUE: Multidetector CT imaging of the chest was performed during
intravenous contrast administration.
CONTRAST:  80mL OMNIPAQUE IOHEXOL 300 MG/ML  SOLN

[Series 2: chest with st · axial · 0.85mm/px · z∈[-296,-41]mm · 12 of 61 slices shown, 15 images]
[im 5/61  mediastinal]
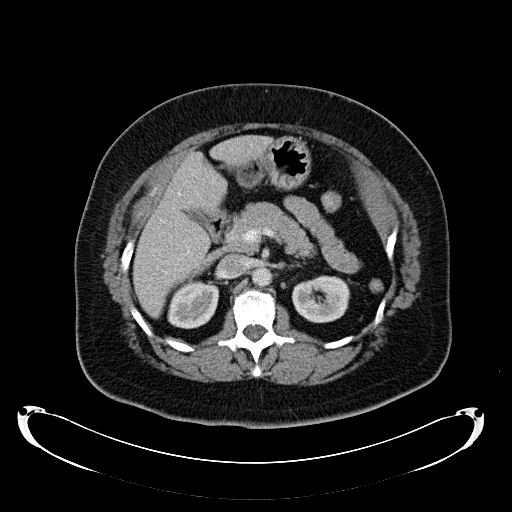
[im 5/61  lung]
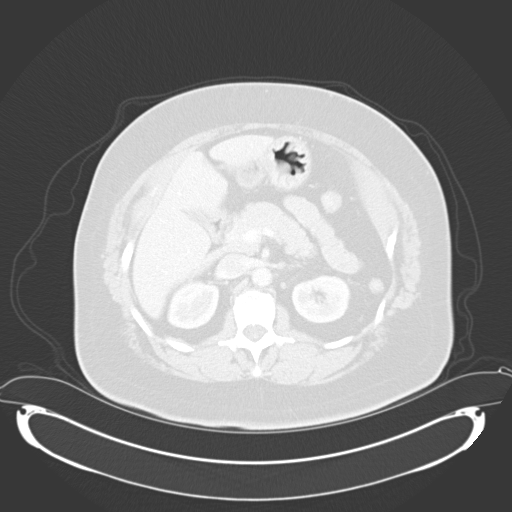
[im 9/61  lung]
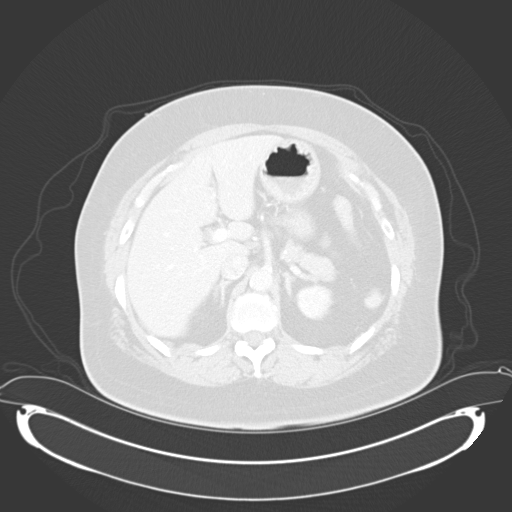
[im 14/61  lung]
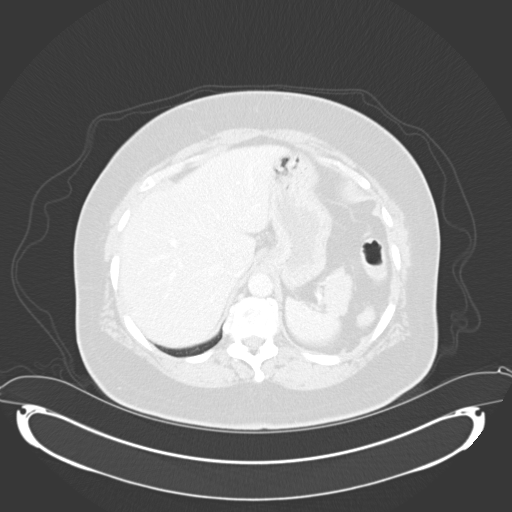
[im 18/61  lung]
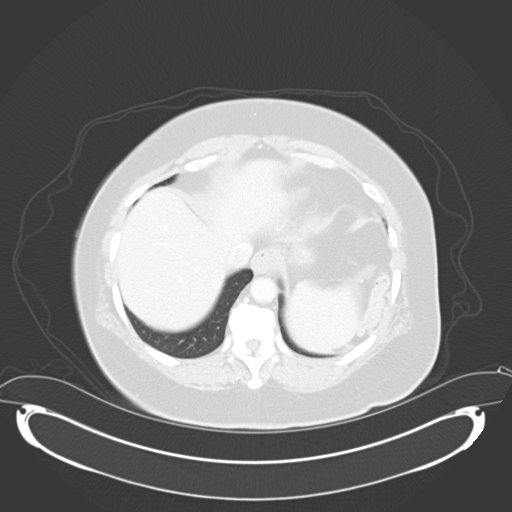
[im 23/61  mediastinal]
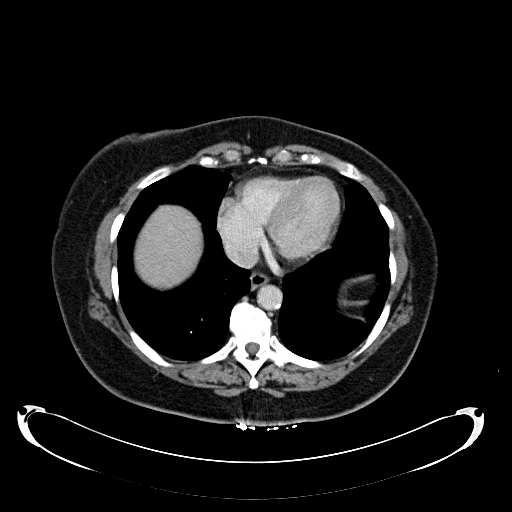
[im 23/61  lung]
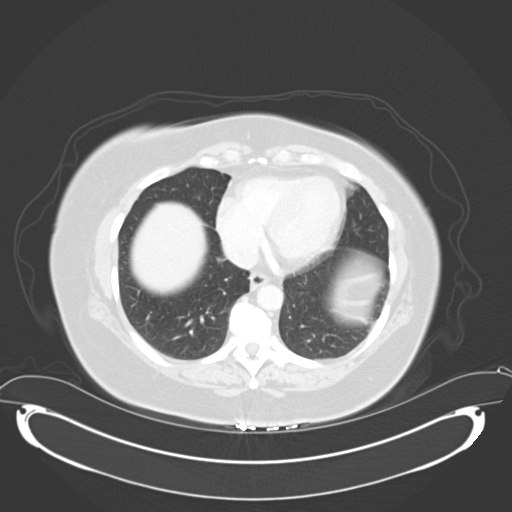
[im 27/61  lung]
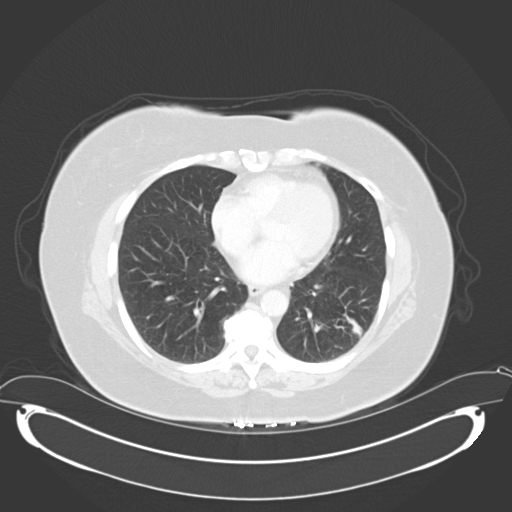
[im 34/61  lung]
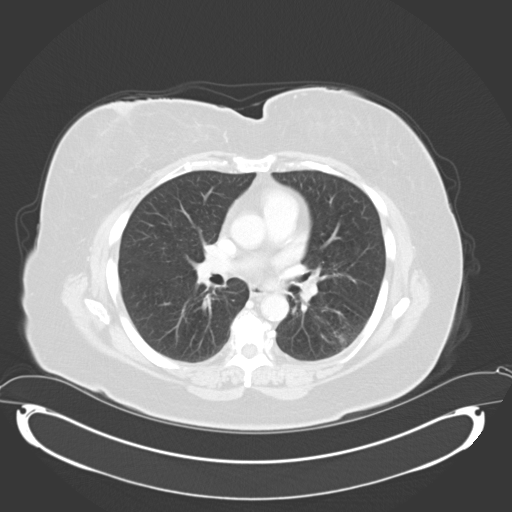
[im 38/61  lung]
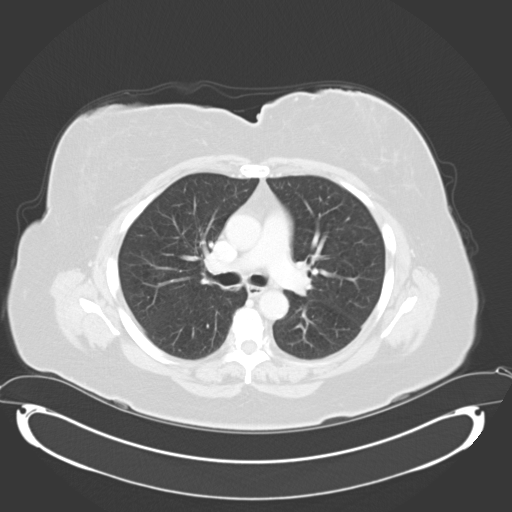
[im 43/61  mediastinal]
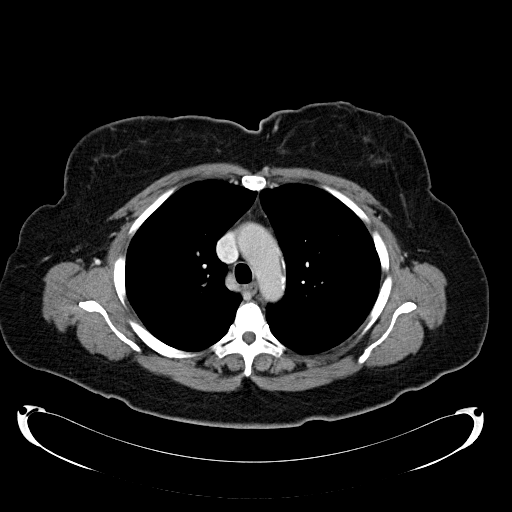
[im 43/61  lung]
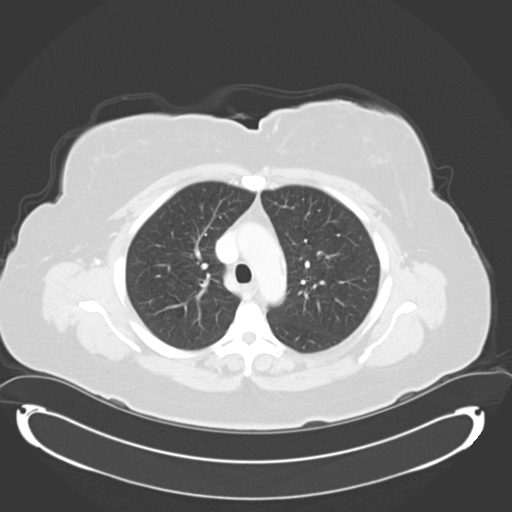
[im 47/61  lung]
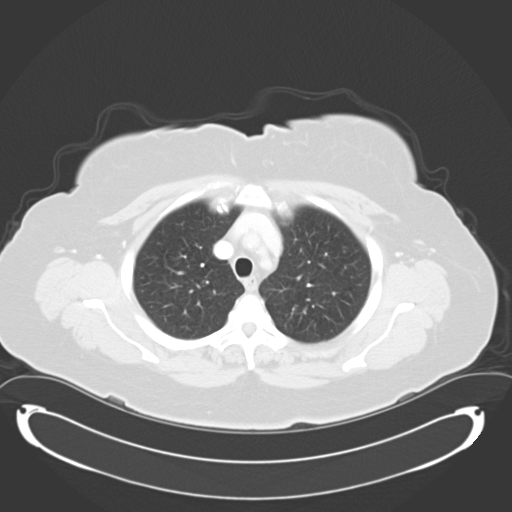
[im 52/61  lung]
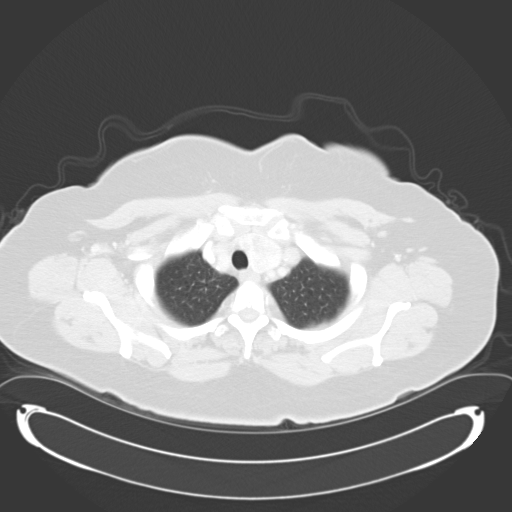
[im 56/61  lung]
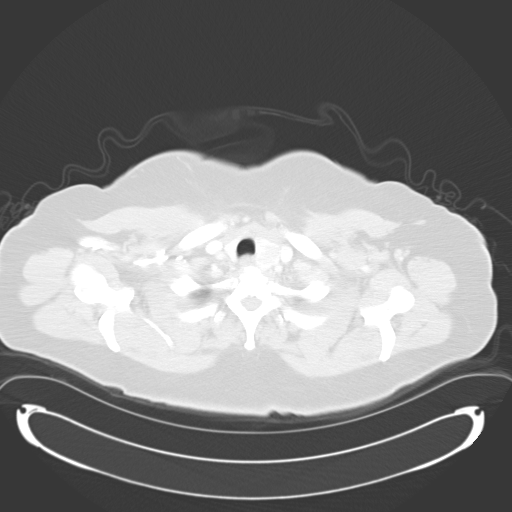

[Series 602: cor · coronal · 0.85mm/px · 3 of 77 slices shown]
[im 16/77  lung]
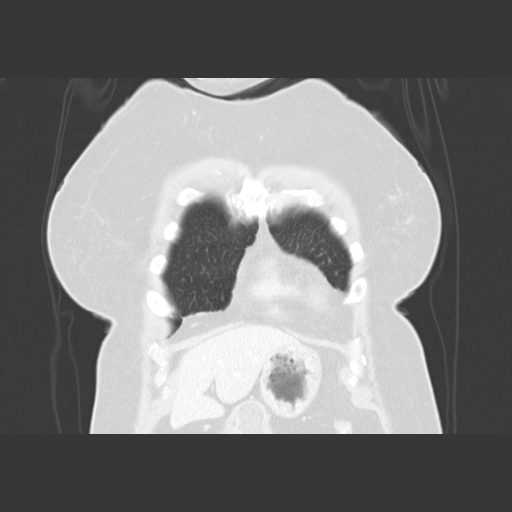
[im 31/77  lung]
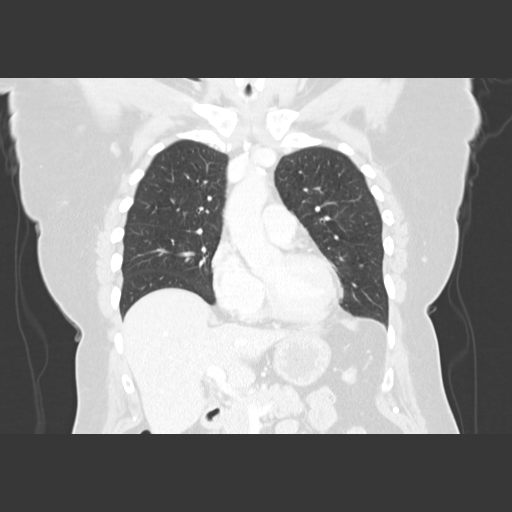
[im 46/77  lung]
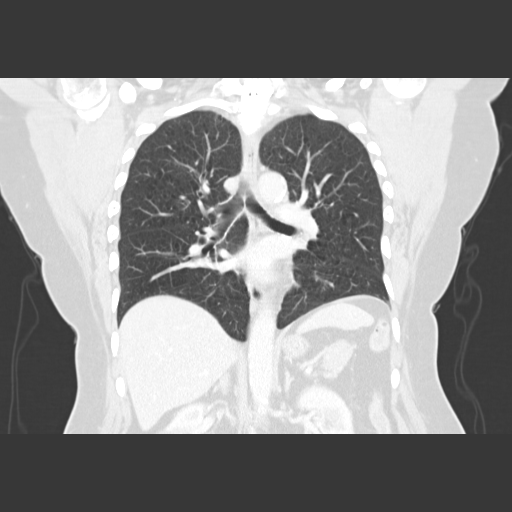

[15 of 36 positions shown; findings below may reference images not displayed]

FINDINGS: No pleural effusion. Left lower lobe pulmonary nodule measures
stress set left lower lobe mass measures 3.8 cm, image 31/series 5.
This has spiculated margins and is worrisome for primary pulmonary
neoplasm. Mild changes of centrilobular emphysema noted.

The trachea appears patent and is midline. The heart size is normal.
No pericardial effusion identified. Borderline enlarged left hilar
lymph node measures 1 cm, image 27/series 2. No mediastinal or
contralateral hilar adenopathy identified.

No axillary or supraclavicular adenopathy. There is a nodule in the
left lobe of thyroid gland measuring 3.9 cm, image 10/series 2.
Incidental imaging through the upper abdomen shows a low attenuation
structure within the left hepatic lobe measuring 5 mm. Posterior
right hepatic lobe hypodensity measures 6 mm. The adrenal glands are
both normal.

Review of the visualized osseous structures is significant for
thoracic degenerative disc disease. No aggressive lytic or sclerotic
bone lesions noted.
IMPRESSION: 1. No acute findings.
2. Left lower lobe pulmonary nodule is worrisome for primary
pulmonary neoplasm. Recommend further assessment with PET-CT and
tissue sampling.
3. Borderline enlarged ipsilateral hilar lymph node.

## 2015-05-31 ENCOUNTER — Encounter (HOSPITAL_COMMUNITY): Payer: Self-pay

## 2015-05-31 ENCOUNTER — Other Ambulatory Visit (HOSPITAL_BASED_OUTPATIENT_CLINIC_OR_DEPARTMENT_OTHER): Payer: Medicare Other

## 2015-05-31 ENCOUNTER — Ambulatory Visit (HOSPITAL_COMMUNITY)
Admission: RE | Admit: 2015-05-31 | Discharge: 2015-05-31 | Disposition: A | Discharge status: Home / Self Care | Payer: Medicare Other | Source: Ambulatory Visit | Attending: Hematology and Oncology | Admitting: Hematology and Oncology

## 2015-05-31 DIAGNOSIS — I700 Atherosclerosis of aorta: Secondary | ICD-10-CM | POA: Diagnosis not present

## 2015-05-31 DIAGNOSIS — E049 Nontoxic goiter, unspecified: Secondary | ICD-10-CM | POA: Insufficient documentation

## 2015-05-31 DIAGNOSIS — R918 Other nonspecific abnormal finding of lung field: Secondary | ICD-10-CM | POA: Insufficient documentation

## 2015-05-31 DIAGNOSIS — Z85118 Personal history of other malignant neoplasm of bronchus and lung: Secondary | ICD-10-CM

## 2015-05-31 DIAGNOSIS — Z902 Acquired absence of lung [part of]: Secondary | ICD-10-CM | POA: Insufficient documentation

## 2015-05-31 DIAGNOSIS — C3432 Malignant neoplasm of lower lobe, left bronchus or lung: Secondary | ICD-10-CM | POA: Diagnosis not present

## 2015-05-31 LAB — CBC WITH DIFFERENTIAL/PLATELET
BASO%: 0.7 % (ref 0.0–2.0)
Basophils Absolute: 0 10*3/uL (ref 0.0–0.1)
EOS ABS: 0.1 10*3/uL (ref 0.0–0.5)
EOS%: 2.4 % (ref 0.0–7.0)
HCT: 35.5 % (ref 34.8–46.6)
HEMOGLOBIN: 11.7 g/dL (ref 11.6–15.9)
LYMPH%: 24 % (ref 14.0–49.7)
MCH: 31.1 pg (ref 25.1–34.0)
MCHC: 33 g/dL (ref 31.5–36.0)
MCV: 94.5 fL (ref 79.5–101.0)
MONO#: 0.7 10*3/uL (ref 0.1–0.9)
MONO%: 13.6 % (ref 0.0–14.0)
NEUT%: 59.3 % (ref 38.4–76.8)
NEUTROS ABS: 3.1 10*3/uL (ref 1.5–6.5)
PLATELETS: 254 10*3/uL (ref 145–400)
RBC: 3.76 10*6/uL (ref 3.70–5.45)
RDW: 15.3 % — AB (ref 11.2–14.5)
WBC: 5.3 10*3/uL (ref 3.9–10.3)
lymph#: 1.3 10*3/uL (ref 0.9–3.3)

## 2015-05-31 LAB — COMPREHENSIVE METABOLIC PANEL
ALBUMIN: 3.8 g/dL (ref 3.5–5.0)
ALK PHOS: 74 U/L (ref 40–150)
ALT: 20 U/L (ref 0–55)
ANION GAP: 7 meq/L (ref 3–11)
AST: 20 U/L (ref 5–34)
BUN: 19.4 mg/dL (ref 7.0–26.0)
CO2: 23 mEq/L (ref 22–29)
Calcium: 9.1 mg/dL (ref 8.4–10.4)
Chloride: 110 mEq/L — ABNORMAL HIGH (ref 98–109)
Creatinine: 1.7 mg/dL — ABNORMAL HIGH (ref 0.6–1.1)
EGFR: 34 mL/min/{1.73_m2} — AB (ref >90)
GLUCOSE: 90 mg/dL (ref 70–140)
Potassium: 4.1 mEq/L (ref 3.5–5.1)
Sodium: 140 mEq/L (ref 136–145)
TOTAL PROTEIN: 6.9 g/dL (ref 6.4–8.3)

## 2015-06-01 ENCOUNTER — Ambulatory Visit (HOSPITAL_BASED_OUTPATIENT_CLINIC_OR_DEPARTMENT_OTHER): Payer: Medicare Other | Admitting: Hematology and Oncology

## 2015-06-01 ENCOUNTER — Telehealth: Payer: Self-pay | Admitting: Hematology and Oncology

## 2015-06-01 VITALS — BP 151/65 | HR 73 | Temp 98.0°F | Resp 20 | Ht 60.0 in | Wt 178.2 lb

## 2015-06-01 DIAGNOSIS — I129 Hypertensive chronic kidney disease with stage 1 through stage 4 chronic kidney disease, or unspecified chronic kidney disease: Secondary | ICD-10-CM | POA: Diagnosis not present

## 2015-06-01 DIAGNOSIS — Z85118 Personal history of other malignant neoplasm of bronchus and lung: Secondary | ICD-10-CM | POA: Diagnosis not present

## 2015-06-01 DIAGNOSIS — N184 Chronic kidney disease, stage 4 (severe): Secondary | ICD-10-CM | POA: Diagnosis not present

## 2015-06-01 DIAGNOSIS — E041 Nontoxic single thyroid nodule: Secondary | ICD-10-CM | POA: Diagnosis not present

## 2015-06-01 NOTE — Telephone Encounter (Signed)
Gave pt apt & avs °

## 2015-06-02 ENCOUNTER — Encounter: Payer: Self-pay | Admitting: Hematology and Oncology

## 2015-06-02 NOTE — Assessment & Plan Note (Signed)
She has chronic left thyroid nodule. She had fine-needle aspirate performed in 2011 which was benign. It is partially calcified. No further workup is needed

## 2015-06-02 NOTE — Assessment & Plan Note (Signed)
She have chronic kidney disease stage IV, related to side effects of chemotherapy and chronic hypertension. Her Kidney function is stable. Continue aggressive risk factor modification.

## 2015-06-02 NOTE — Progress Notes (Signed)
[facility] OFFICE PROGRESS NOTE  Patient Care Team: Glendale Chard, MD as PCP - General (Internal Medicine) Heath Lark, MD as Consulting Physician (Hematology and Oncology) [name], MD as Consulting Physician (Cardiothoracic Surgery)  SUMMARY OF ONCOLOGIC HISTORY: Oncology History   Non-small cell lung cancer, adenocarcinoma   Primary site: Lung (Left)   Staging method: AJCC 7th Edition   Clinical: Stage IB (T2a, N0, M0) signed by Heath Lark, MD on 04/29/2013  9:01 PM   Pathologic: Stage IB (T2a, N0, cM0) signed by [name], MD on 04/09/2013 12:51 PM   Summary: Stage IB (T2a, N0, cM0)       History of lung cancer   02/26/2013 Imaging CXR for evaluation of cough showed new left lung mass   02/28/2013 Imaging CT chest showed left new lung nodule   03/14/2013 Imaging PET/CT scan showed localized disease in the left lung   04/07/2013 Surgery She underwent bronchoscopy and left lower lobe resection and lymph node sampling with negative margins   05/06/2013 Procedure The patient has placement of Infuse-a-Port   05/21/2013 - 07/30/2013 Chemotherapy She completed 4 cycles of adjuvant chemotherapy with navelbine and cisplatin   07/01/2013 Imaging CT scan of the chest shows stable appearance.   07/02/2013 Adverse Reaction Dose of chemotherapy was adjusted further due to severe anemia.   12/31/2013 Imaging CT scan of the chest show no evidence of disease recurrence   07/03/2014 Imaging Chest x-ray looks normal   12/31/2014 Imaging Ct chest showed no evidence of recurrence   05/31/2015 Imaging Status post resection of a left lower lobe lung lesion without findings for recurrent tumor or metastatic disease.     INTERVAL HISTORY: Please see below for problem oriented charting. She returns for further follow-up. She feels well. Denies recent infection. She has excellent energy level. She denies dysphagia.  REVIEW OF SYSTEMS:   Constitutional: Denies fevers, chills or abnormal  weight loss Eyes: Denies blurriness of vision Ears, nose, mouth, throat, and face: Denies mucositis or sore throat Respiratory: Denies cough, dyspnea or wheezes Cardiovascular: Denies palpitation, chest discomfort or lower extremity swelling Gastrointestinal:  Denies nausea, heartburn or change in bowel habits Skin: Denies abnormal skin rashes Lymphatics: Denies new lymphadenopathy or easy bruising Neurological:Denies numbness, tingling or new weaknesses Behavioral/Psych: Mood is stable, no new changes  All other systems were reviewed with the patient and are negative.  I have reviewed the past medical history, past surgical history, social history and family history with the patient and they are unchanged from previous note.  ALLERGIES:  is allergic to aspirin; lactose intolerance (gi); adhesive; lisinopril; and percocet.  MEDICATIONS:  Current Outpatient Prescriptions  Medication Sig Dispense Refill  . amLODipine (NORVASC) 10 MG tablet Take 1 tablet (10 mg total) by mouth daily. 90 tablet 1  . atorvastatin (LIPITOR) 10 MG tablet Take 10 mg by mouth daily at 6 PM.     . chlorthalidone (HYGROTON) 25 MG tablet Take 1 tablet by mouth daily.  4  . Cholecalciferol (VITAMIN D-3) 1000 UNITS CAPS Take 1,000 Units by mouth daily.     . clopidogrel (PLAVIX) 75 MG tablet Take 75 mg by mouth daily with breakfast.     . magnesium oxide (MAG-OX) 400 MG tablet Take 400 mg by mouth daily.    . metoprolol succinate (TOPROL-XL) 25 MG 24 hr tablet Take 25 mg by mouth daily.     . Multiple Vitamin (MULTIVITAMIN) tablet Take 1 tablet by mouth at bedtime.     Marland Kitchen  TRAVATAN Z 0.004 % SOLN ophthalmic solution Apply 1 drop to eye daily. In affected eye  6  . valsartan (DIOVAN) 80 MG tablet Take 1 tablet (80 mg total) by mouth daily. (Patient taking differently: Take 80 mg by mouth at bedtime. )     No current facility-administered medications for this visit.    PHYSICAL EXAMINATION: ECOG PERFORMANCE STATUS: 0  - Asymptomatic  Filed Vitals:   06/01/15 1043  BP: 151/65  Pulse: 73  Temp: 98 F (36.7 C)  Resp: 20   Filed Weights   06/01/15 1043  Weight: 178 lb 3.2 oz (80.831 kg)    GENERAL:alert, no distress and comfortable SKIN: skin color, texture, turgor are normal, no rashes or significant lesions EYES: normal, Conjunctiva are pink and non-injected, sclera clear OROPHARYNX:no exudate, no erythema and lips, buccal mucosa, and tongue normal  NECK: supple, thyroid normal size, non-tender, without nodularity LYMPH:  no palpable lymphadenopathy in the cervical, axillary or inguinal LUNGS: clear to auscultation and percussion with normal breathing effort HEART: regular rate & rhythm and no murmurs and no lower extremity edema ABDOMEN:abdomen soft, non-tender and normal bowel sounds Musculoskeletal:no cyanosis of digits and no clubbing  NEURO: alert & oriented x 3 with fluent speech, no focal motor/sensory deficits  LABORATORY DATA:  I have reviewed the data as listed    Component Value Date/Time   NA 140 05/31/2015 1131   NA 138 01/07/2014 1239   K 4.1 05/31/2015 1131   K 4.7 01/07/2014 1239   CL 105 01/07/2014 1239   CO2 23 05/31/2015 1131   CO2 21 01/07/2014 1239   GLUCOSE 90 05/31/2015 1131   GLUCOSE 90 01/07/2014 1239   BUN 19.4 05/31/2015 1131   BUN 17 01/07/2014 1239   CREATININE 1.7* 05/31/2015 1131   CREATININE 1.33* 01/07/2014 1239   CALCIUM 9.1 05/31/2015 1131   CALCIUM 9.7 01/07/2014 1239   PROT 6.9 05/31/2015 1131   PROT 6.5 05/06/2013 0619   ALBUMIN 3.8 05/31/2015 1131   ALBUMIN 3.5 05/06/2013 0619   AST 20 05/31/2015 1131   AST 24 05/06/2013 0619   ALT 20 05/31/2015 1131   ALT 17 05/06/2013 0619   ALKPHOS 74 05/31/2015 1131   ALKPHOS 70 05/06/2013 0619   BILITOT <0.30 05/31/2015 1131   BILITOT <0.2* 05/06/2013 0619   GFRNONAA 38* 01/07/2014 1239   GFRAA 44* 01/07/2014 1239    No results found for: SPEP, UPEP  Lab Results  Component Value Date   WBC  5.3 05/31/2015   NEUTROABS 3.1 05/31/2015   HGB 11.7 05/31/2015   HCT 35.5 05/31/2015   MCV 94.5 05/31/2015   PLT 254 05/31/2015      Chemistry      Component Value Date/Time   NA 140 05/31/2015 1131   NA 138 01/07/2014 1239   K 4.1 05/31/2015 1131   K 4.7 01/07/2014 1239   CL 105 01/07/2014 1239   CO2 23 05/31/2015 1131   CO2 21 01/07/2014 1239   BUN 19.4 05/31/2015 1131   BUN 17 01/07/2014 1239   CREATININE 1.7* 05/31/2015 1131   CREATININE 1.33* 01/07/2014 1239      Component Value Date/Time   CALCIUM 9.1 05/31/2015 1131   CALCIUM 9.7 01/07/2014 1239   ALKPHOS 74 05/31/2015 1131   ALKPHOS 70 05/06/2013 0619   AST 20 05/31/2015 1131   AST 24 05/06/2013 0619   ALT 20 05/31/2015 1131   ALT 17 05/06/2013 0619   BILITOT <0.30 05/31/2015 1131   BILITOT <  0.2* 05/06/2013 9563       RADIOGRAPHIC STUDIES: I have personally reviewed the radiological images as listed and agreed with the findings in the report. Ct Chest Wo Contrast  05/31/2015  CLINICAL DATA:  Restaging non small cell lung cancer. Status post left lower lobe resection. Chemotherapy complete. EXAM: CT CHEST WITHOUT CONTRAST TECHNIQUE: Multidetector CT imaging of the chest was performed following the standard protocol without IV contrast. COMPARISON:  12/31/2014 FINDINGS: Mediastinum/Nodes: No breast masses, supraclavicular or axillary lymphadenopathy. Stable multinodular goiter involving the left thyroid lobe. The heart is normal in size. No pericardial effusion. Stable aortic and coronary artery calcifications. No mediastinal or hilar mass or adenopathy. Small scattered lymph nodes are stable. The esophagus is grossly normal. There is a small hiatal hernia. Lungs/Pleura: Stable emphysematous changes and pulmonary scarring. Stable surgical changes from a left lower lobe lung resection. Stable left basilar pleural and parenchymal scarring changes. No findings worrisome for recurrent tumor. No metastatic pulmonary nodules.  No bronchiectasis or interstitial lung disease. Upper abdomen: No significant findings. Musculoskeletal: No significant findings. Stable degenerative changes involving the spine. IMPRESSION: Status post resection of a left lower lobe lung lesion without findings for recurrent tumor or metastatic disease. No mediastinal or hilar mass or adenopathy. Stable emphysematous changes but no acute pulmonary findings. Stable atherosclerotic calcifications involving the aorta and branch vessels. Stable left thyroid goiter. Electronically Signed   By: Marijo Sanes M.D.   On: 05/31/2015 15:42     ASSESSMENT & PLAN:  History of lung cancer She is doing well with no evidence of disease recurrence. I will see her back in 12 months with repeat blood work, history, physical examination and chest CT I recommend she sees cancer survivorship in 6 months to maximize outcome.   Benign hypertension with CKD (chronic kidney disease) stage IV (HCC) She have chronic kidney disease stage IV, related to side effects of chemotherapy and chronic hypertension. Her Kidney function is stable. Continue aggressive risk factor modification.  Left thyroid nodule She has chronic left thyroid nodule. She had fine-needle aspirate performed in 2011 which was benign. It is partially calcified. No further workup is needed   Orders Placed This Encounter  Procedures  . CT Chest Wo Contrast    Standing Status: Future     Number of Occurrences:      Standing Expiration Date: 07/31/2016    Order Specific Question:  Reason for Exam (SYMPTOM  OR DIAGNOSIS REQUIRED)    Answer:  hx lung cancer exclude recurrence    Order Specific Question:  Preferred imaging location?    Answer:  [facility]  . CBC with Differential/Platelet    Standing Status: Future     Number of Occurrences:      Standing Expiration Date: 07/31/2016  . Comprehensive metabolic panel    Standing Status: Future     Number of Occurrences:      Standing  Expiration Date: 07/31/2016  . Amb Referral to Survivorship Long term    Referral Priority:  Routine    Referral Type:  Consultation    Referred to Provider:  Holley Bouche, NP    Number of Visits Requested:  1   All questions were answered. The patient knows to call the clinic with any problems, questions or concerns. No barriers to learning was detected. I spent 15 minutes counseling the patient face to face. The total time spent in the appointment was 20 minutes and more than 50% was on counseling and review of  test results     [facility], Esmeralda Malay, MD 06/02/2015 10:44 AM

## 2015-06-02 NOTE — Assessment & Plan Note (Signed)
She is doing well with no evidence of disease recurrence. I will see her back in 12 months with repeat blood work, history, physical examination and chest CT I recommend she sees cancer survivorship in 6 months to maximize outcome.

## 2015-06-23 DIAGNOSIS — D631 Anemia in chronic kidney disease: Secondary | ICD-10-CM | POA: Diagnosis not present

## 2015-06-23 DIAGNOSIS — N2581 Secondary hyperparathyroidism of renal origin: Secondary | ICD-10-CM | POA: Diagnosis not present

## 2015-06-23 DIAGNOSIS — N183 Chronic kidney disease, stage 3 (moderate): Secondary | ICD-10-CM | POA: Diagnosis not present

## 2015-06-24 ENCOUNTER — Telehealth: Payer: Self-pay | Admitting: Adult Health

## 2015-06-24 NOTE — Telephone Encounter (Signed)
I spoke with Ms. [name]'s daughter re: a referral I received from Dr. Alvy Bimler for me to see the patient in 11/2015 to further optimize her mother's recovery from treatment for lung cancer.  I briefly explained my role in their care and how she may benefit from survivorship.  I explained that she would receive a survivorship care plan at her visit with me and we would review that document together. I encouraged her to contact me with any questions or concerns before her appt here at the cancer center and I would be happy to see them sooner if needed.   I have also mailed a letter, appointment calendar, survivorship brochure, and my business card to the patient's home.  I look forward to participating in their care!  Mike Craze, NP [place] [phone]

## 2015-08-03 ENCOUNTER — Ambulatory Visit: Payer: Medicare Other | Admitting: Cardiothoracic Surgery

## 2015-08-09 ENCOUNTER — Ambulatory Visit: Payer: Medicare Other | Admitting: Cardiothoracic Surgery

## 2015-08-16 ENCOUNTER — Ambulatory Visit (INDEPENDENT_AMBULATORY_CARE_PROVIDER_SITE_OTHER): Payer: Medicare Other | Admitting: Cardiothoracic Surgery

## 2015-08-16 ENCOUNTER — Ambulatory Visit: Payer: Medicare Other | Admitting: Cardiothoracic Surgery

## 2015-08-16 ENCOUNTER — Encounter: Payer: Self-pay | Admitting: Cardiothoracic Surgery

## 2015-08-16 VITALS — BP 143/76 | HR 70 | Resp 20 | Ht 60.0 in | Wt 178.0 lb

## 2015-08-16 DIAGNOSIS — C3492 Malignant neoplasm of unspecified part of left bronchus or lung: Secondary | ICD-10-CM

## 2015-08-16 DIAGNOSIS — Z902 Acquired absence of lung [part of]: Secondary | ICD-10-CM

## 2015-08-16 NOTE — Progress Notes (Signed)
[address]       [place] [address]             [phone]      [name] Malcolm Medical Record #[id number] Date of Birth: 11/15/1935  Referring: Tanda Rockers, MD Primary Care: Maximino Greenland, MD  Chief Complaint:   POST OP FOLLOW UP 04/07/2013  OPERATIVE REPORT  Preop diagnosis: Left lower lobe lung mass suspicious for primary carcinoma the lung  Postop diagnosis: Same, non-small cell lung cancer probably adenocarcinoma by frozen section  Procedure:  Bronchoscopy  Left video-assisted thoracoscopy  Left lower lobectomy and lymph node dissection  Placement of On-Q device  Surgeon: [name] MD   Stage IB ( pT2a,pN0,cM0)  Diagnosis 1. Lung, resection (segmental or lobe), Left lower lobe - INVASIVE ADENOCARCINOMA, POORLY DIFFERENTIATED, SPANNING 4.1 CM. - ADENOCARCINOMA INVOLVES PLEURA. - THE SURGICAL RESECTION MARGINS ARE NEGATIVE FOR ADENOCARCINOMA. - ONE BENIGN HILAR LYMPH NODE (0/1). - SEE ONCOLOGY TABLE BELOW. 2. Lymph node, biopsy, 8 L - THERE IS NO EVIDENCE OF CARCINOMA IN 1 OF 1 LYMPH NODE (0/1). 3. Lymph node, biopsy, 11 L - THERE IS NO EVIDENCE OF CARCINOMA IN 1 OF 1 LYMPH NODE (0/1). 4. Lymph node, biopsy, 12 L - THERE IS NO EVIDENCE OF CARCINOMA IN 1 OF 1 LYMPH NODE (0/1). 5. Lymph node, biopsy, 10 L - THERE IS NO EVIDENCE OF CARCINOMA IN 1 OF 1 LYMPH NODE (0/1). 6. Lymph node, biopsy, 4 L - THERE IS NO EVIDENCE OF CARCINOMA IN 1 OF 1 LYMPH NODE (0/1).  History of Present Illness:      Patient remains active 80 years. She has completed course of chemo in OCT 2015. She completed 4 cycles of adjuvant chemotherapy with navelbine and cisplatin , now is 28 months postop. Patient remains active.   Past Medical History:  Diagnosis Date  . Arthritis   . Benign hypertensive kidney disease with chronic kidney disease stage I through stage IV, or unspecified    sees dr. Justin Mend again in april  . Cancer ([place])    lung ca  dx'd 02/2013  . Chronic kidney disease, stage III (moderate)   . Cough 02/26/2013  . GERD (gastroesophageal reflux disease)   . Headache   . Hypertension   . Hyperthyroidism    took iodine treatment for this  . Lung mass 02/27/2013  . Neurologic disorder    2009  . Shortness of breath    exertion  . Thrombocytopenia, unspecified ([place]) 02/26/2013  . Unspecified vitamin D deficiency      History  Smoking Status  . Former Smoker  . Packs/day: 0.50  . Years: 60.00  . Types: Cigarettes  . Quit date: 02/25/2012  Smokeless Tobacco  . Never Used    History  Alcohol Use No     Allergies  Allergen Reactions  . Aspirin Other (See Comments)    REACTION: stomach upset  . Lactose Intolerance (Gi) Diarrhea and Other (See Comments)    "can't drink milk. It tears my stomach up."  . Adhesive [Tape] Rash  . Lisinopril Other (See Comments)    cough  . Percocet [Oxycodone-Acetaminophen] Itching    Current Outpatient Prescriptions  Medication Sig Dispense Refill  . atorvastatin (LIPITOR) 10 MG tablet Take 10 mg by mouth daily at 6 PM.     . chlorthalidone (HYGROTON) 25 MG tablet Take 1 tablet by mouth daily.  4  . Cholecalciferol (VITAMIN D-3) 1000 UNITS CAPS  Take 1,000 Units by mouth daily.     . clopidogrel (PLAVIX) 75 MG tablet Take 75 mg by mouth daily with breakfast.     . magnesium oxide (MAG-OX) 400 MG tablet Take 400 mg by mouth daily.    . metoprolol succinate (TOPROL-XL) 25 MG 24 hr tablet Take 25 mg by mouth daily.     . Multiple Vitamin (MULTIVITAMIN) tablet Take 1 tablet by mouth at bedtime.     . TRAVATAN Z 0.004 % SOLN ophthalmic solution Apply 1 drop to eye daily. In affected eye  6  . valsartan (DIOVAN) 80 MG tablet Take 1 tablet (80 mg total) by mouth daily. (Patient taking differently: Take 80 mg by mouth at bedtime. )     No current facility-administered medications for this visit.        Physical Exam: BP (!) 143/76 (BP Location: Right Arm, Patient Position:  Sitting, Cuff Size: Normal)   Pulse 70   Resp 20   Ht 5' (1.524 m)   Wt 178 lb (80.7 kg)   SpO2 95% Comment: RA  BMI 34.76 kg/m   General appearance: alert and cooperative Neurologic: intact Heart: regular rate and rhythm, S1, S2 normal, no murmur, click, rub or gallop Lungs: clear to auscultation bilaterally Abdomen: soft, non-tender; bowel sounds normal; no masses,  no organomegaly Extremities: extremities normal, atraumatic, no cyanosis or edema and Homans sign is negative, no sign of DVT Wound: Her left chest incisions are well-healed   Diagnostic Studies & Laboratory data:     Recent Radiology Findings:  CLINICAL DATA:  Restaging non small cell lung cancer. Status post left lower lobe resection. Chemotherapy complete.  EXAM: CT CHEST WITHOUT CONTRAST  TECHNIQUE: Multidetector CT imaging of the chest was performed following the standard protocol without IV contrast.  COMPARISON:  12/31/2014  FINDINGS: Mediastinum/Nodes: No breast masses, supraclavicular or axillary lymphadenopathy. Stable multinodular goiter involving the left thyroid lobe.  The heart is normal in size. No pericardial effusion. Stable aortic and coronary artery calcifications.  No mediastinal or hilar mass or adenopathy. Small scattered lymph nodes are stable. The esophagus is grossly normal. There is a small hiatal hernia.  Lungs/Pleura: Stable emphysematous changes and pulmonary scarring. Stable surgical changes from a left lower lobe lung resection. Stable left basilar pleural and parenchymal scarring changes. No findings worrisome for recurrent tumor. No metastatic pulmonary nodules. No bronchiectasis or interstitial lung disease.  Upper abdomen: No significant findings.  Musculoskeletal: No significant findings. Stable degenerative changes involving the spine.  IMPRESSION: Status post resection of a left lower lobe lung lesion without findings for recurrent tumor or  metastatic disease.  No mediastinal or hilar mass or adenopathy.  Stable emphysematous changes but no acute pulmonary findings.  Stable atherosclerotic calcifications involving the aorta and branch vessels.  Stable left thyroid goiter.   Electronically Signed   By: Marijo Sanes M.D.   On: 05/31/2015 15:42     Ct Chest Wo Contrast  12/31/2014  CLINICAL DATA:  Subsequent evaluation of a 80 year old female with history of non-small cell carcinoma both the left lower lobe diagnosed in January 2015, status post left lower lobectomy and chemotherapy (completed). EXAM: CT CHEST WITHOUT CONTRAST TECHNIQUE: Multidetector CT imaging of the chest was performed following the standard protocol without IV contrast. COMPARISON:  Chest CT 07/23/2014. FINDINGS: Mediastinum/Lymph Nodes: Heart size is normal. There is no significant pericardial fluid, thickening or pericardial calcification. There is atherosclerosis of the thoracic aorta, the great vessels of the mediastinum and the  coronary arteries, including calcified atherosclerotic plaque in the left anterior descending, left circumflex and right coronary arteries. No pathologically enlarged mediastinal or hilar lymph nodes. Please note that accurate exclusion of hilar adenopathy is limited on noncontrast CT scans. Asymmetrically enlarged left lobe of thyroid gland which appears to be occupied by a dominant partially calcified nodule, which is slightly larger than the prior examination, measuring 2.8 x 3.2 cm on today's study (image 8 of series 2). Small hiatal hernia. No axillary lymphadenopathy. Lungs/Pleura: Status post left lower lobectomy. Compensatory hyperexpansion of the left upper lobe. Small amount of peripheral pleuroparenchymal thickening in the inferior aspect of the left upper lobe, similar to the prior study, most compatible with some mild chronic scarring. No suspicious appearing pulmonary nodules or masses. No acute consolidative  airspace disease. No pleural effusions. Mild diffuse bronchial wall thickening with mild centrilobular and paraseptal emphysema. Upper Abdomen: Atherosclerosis. 4 mm low-attenuation lesion in segment 2 of the liver again noted, incompletely characterized on today's noncontrast CT examination, but likely a tiny cyst. Musculoskeletal/Soft Tissues: Orthopedic fixation hardware in the lower cervical spine incompletely imaged. There are no aggressive appearing lytic or blastic lesions noted in the visualized portions of the skeleton. IMPRESSION: 1. Postoperative changes of left lower lobectomy. No findings to suggest recurrent or metastatic disease in the thorax. 2. Slight enlargement of a heterogeneous appearing nodule in the left lobe of the thyroid gland which is partially calcified measuring 2.8 x 3.2 cm on today's study. Further evaluation with nonemergent thyroid ultrasound is recommended at this time. 3. Mild diffuse bronchial wall thickening with mild centrilobular and paraseptal emphysema; imaging findings suggestive of underlying COPD. 4. Atherosclerosis, including three-vessel coronary artery disease. Assessment for potential risk factor modification, dietary therapy or pharmacologic therapy may be warranted, if clinically indicated. Electronically Signed   By: Vinnie Langton M.D.   On: 12/31/2014 14:54   I have independently reviewed the above radiology studies  and reviewed the findings with the patient.   CLINICAL DATA: Lung cancer, completed chemotherapy. Shortness of breath.  EXAM: CT CHEST WITH CONTRAST  TECHNIQUE: Multidetector CT imaging of the chest was performed during intravenous contrast administration.  CONTRAST: 76m OMNIPAQUE IOHEXOL 300 MG/ML SOLN  COMPARISON: 07/01/2013  FINDINGS: 3.1 by 2.6 cm solid and partially calcified left thyroid nodule, stable.  Left subclavian Port-A-Cath tip: SVC.  Atherosclerotic aortic arch. Coronary artery  atherosclerosis observed in the left anterior descending artery.  The trace left pleural effusion has nearly completely resolved. Focal pleural thickening or adjacent mild rounded atelectasis in the left lower lung is reduced in size and prominence on image 35 of series 2. This currently measures up to 6 mm in thickness.  No pathologic thoracic adenopathy.  Left lower lobectomy.  Indistinct 6 by 7 mm by 4 mm ground-glass density in the left lung on image 32 of series 5 posteriorly in, stable.  Thoracic spondylosis. Lower cervical plate and screw fixator.  Centrilobular emphysema.  Small focus of enhancement along the left side of the lateral segment left hepatic lobe, image 48 of series 2, stable, not previously hypermetabolic on prior PET-CT, probably flash filling of a hemangioma or a small perfusion anomaly.  IMPRESSION: 1. No findings of recurrence or significant change. 6 by 7 by 4 mm slightly sub solid left posterior nodule merits observation. 2. Stable left thyroid nodule. This was not previously hypermetabolic. 3. Stable small enhancing focus in the lateral segment left hepatic lobe, probably a flash filling hemangioma or perfusion anomaly, not previously  hypermetabolic. 4. Centrilobular emphysema. 5. Left lower lobectomy. Focal scarring posteriorly in the remaining lower posterior left lung, with reduced size of the trace pleural effusion. 6. Atherosclerosis.   Electronically Signed  By: Sherryl Barters M.D.  On: 12/31/2013 15:39  Recent Lab Findings: Lab Results  Component Value Date   WBC 5.3 05/31/2015   HGB 11.7 05/31/2015   HCT 35.5 05/31/2015   PLT 254 05/31/2015   GLUCOSE 90 05/31/2015   ALT 20 05/31/2015   AST 20 05/31/2015   NA 140 05/31/2015   K 4.1 05/31/2015   CL 105 01/07/2014   CREATININE 1.7 (H) 05/31/2015   BUN 19.4 05/31/2015   CO2 23 05/31/2015   INR 0.96 05/06/2013      Assessment / Plan:   Patient doing well  following left lower lobectomy for a 4.2 cm adenocarcinoma stage IB 28 months ago,  Slight enlargement of a heterogeneous appearing nodule in the left lobe of the thyroid gland which is partially calcified measuring 2.8 x 3.2 cm  Further evaluation with nonemergent thyroid ultrasound-patient has  known multinodular goiter with previous scan done in 2011-she is referred back to primary care for evaluation  Follow-up CT scan done today shows no evidence of recurrence, she has a follow-up CT scan already ordered by oncology for 12 months I plan to see her back in 83 months   [name] MD      [address] [place] [address] Office [phone]   [facility] [phone]  08/16/2015 5:28 PM

## 2015-08-27 ENCOUNTER — Other Ambulatory Visit: Payer: Self-pay | Admitting: Internal Medicine

## 2015-08-27 DIAGNOSIS — Z1231 Encounter for screening mammogram for malignant neoplasm of breast: Secondary | ICD-10-CM

## 2015-09-02 DIAGNOSIS — N183 Chronic kidney disease, stage 3 (moderate): Secondary | ICD-10-CM | POA: Diagnosis not present

## 2015-09-02 DIAGNOSIS — Z0000 Encounter for general adult medical examination without abnormal findings: Secondary | ICD-10-CM | POA: Diagnosis not present

## 2015-09-02 DIAGNOSIS — R7309 Other abnormal glucose: Secondary | ICD-10-CM | POA: Diagnosis not present

## 2015-09-02 DIAGNOSIS — I129 Hypertensive chronic kidney disease with stage 1 through stage 4 chronic kidney disease, or unspecified chronic kidney disease: Secondary | ICD-10-CM | POA: Diagnosis not present

## 2015-09-02 DIAGNOSIS — E041 Nontoxic single thyroid nodule: Secondary | ICD-10-CM | POA: Diagnosis not present

## 2015-09-02 DIAGNOSIS — E559 Vitamin D deficiency, unspecified: Secondary | ICD-10-CM | POA: Diagnosis not present

## 2015-09-07 ENCOUNTER — Other Ambulatory Visit: Payer: Self-pay | Admitting: Internal Medicine

## 2015-09-07 DIAGNOSIS — E041 Nontoxic single thyroid nodule: Secondary | ICD-10-CM

## 2015-09-10 ENCOUNTER — Ambulatory Visit
Admission: RE | Admit: 2015-09-10 | Discharge: 2015-09-10 | Disposition: A | Discharge status: Home / Self Care | Payer: Medicare Other | Source: Ambulatory Visit | Attending: Internal Medicine | Admitting: Internal Medicine

## 2015-09-10 DIAGNOSIS — E042 Nontoxic multinodular goiter: Secondary | ICD-10-CM | POA: Diagnosis not present

## 2015-09-10 DIAGNOSIS — E041 Nontoxic single thyroid nodule: Secondary | ICD-10-CM

## 2015-09-30 ENCOUNTER — Ambulatory Visit
Admission: RE | Admit: 2015-09-30 | Discharge: 2015-09-30 | Disposition: A | Discharge status: Home / Self Care | Payer: Medicare Other | Source: Ambulatory Visit | Attending: Internal Medicine | Admitting: Internal Medicine

## 2015-09-30 DIAGNOSIS — Z1231 Encounter for screening mammogram for malignant neoplasm of breast: Secondary | ICD-10-CM | POA: Diagnosis not present

## 2015-10-16 DIAGNOSIS — Z23 Encounter for immunization: Secondary | ICD-10-CM | POA: Diagnosis not present

## 2015-10-19 DIAGNOSIS — E042 Nontoxic multinodular goiter: Secondary | ICD-10-CM | POA: Diagnosis not present

## 2015-11-29 ENCOUNTER — Telehealth: Payer: Self-pay | Admitting: *Deleted

## 2015-11-29 NOTE — Telephone Encounter (Signed)
Call from pt's daughter requesting to change 11/8 survivorship visit to the next day. No availability noted. Message to schedulers to call pt with appt for the following week. Pt understands to expect call.

## 2015-12-01 ENCOUNTER — Encounter: Payer: Medicare Other | Admitting: Adult Health

## 2015-12-09 ENCOUNTER — Encounter: Payer: Self-pay | Admitting: Adult Health

## 2015-12-09 ENCOUNTER — Ambulatory Visit (HOSPITAL_BASED_OUTPATIENT_CLINIC_OR_DEPARTMENT_OTHER): Payer: Medicare Other | Admitting: Adult Health

## 2015-12-09 VITALS — BP 162/78 | HR 63 | Temp 97.7°F | Resp 18 | Ht 60.0 in | Wt 177.7 lb

## 2015-12-09 DIAGNOSIS — R252 Cramp and spasm: Secondary | ICD-10-CM

## 2015-12-09 DIAGNOSIS — H9190 Unspecified hearing loss, unspecified ear: Secondary | ICD-10-CM

## 2015-12-09 DIAGNOSIS — Z85118 Personal history of other malignant neoplasm of bronchus and lung: Secondary | ICD-10-CM | POA: Diagnosis not present

## 2015-12-09 DIAGNOSIS — C3492 Malignant neoplasm of unspecified part of left bronchus or lung: Secondary | ICD-10-CM

## 2015-12-09 NOTE — Patient Instructions (Signed)
I really enjoyed meeting you and your daughter today.  Please feel free to call me with any questions or concerns!  Happy Holidays!  Mike Craze, NP [place] [phone]

## 2015-12-09 NOTE — Progress Notes (Signed)
CLINIC:  Survivorship  REASON FOR VISIT:  Long-term survivorship surveillance visit for patient with history of lung cancer.   BRIEF ONCOLOGIC HISTORY:  Oncology History   Non-small cell lung cancer, adenocarcinoma   Primary site: Lung (Left)   Staging method: AJCC 7th Edition   Clinical: Stage IB (T2a, N0, M0) signed by Heath Lark, MD on 04/29/2013  9:01 PM   Pathologic: Stage IB (T2a, N0, cM0) signed by [name], MD on 04/09/2013 12:51 PM   Summary: Stage IB (T2a, N0, cM0)       History of lung cancer   02/26/2013 Imaging    CXR for evaluation of cough showed new left lung mass      02/28/2013 Imaging    CT chest showed left new lung nodule      03/14/2013 Imaging    PET/CT scan showed localized disease in the left lung      04/07/2013 Surgery    She underwent bronchoscopy and left lower lobe resection and lymph node sampling with negative margins      05/06/2013 Procedure    The patient has placement of Infuse-a-Port      05/21/2013 - 07/30/2013 Chemotherapy    She completed 4 cycles of adjuvant chemotherapy with navelbine and cisplatin      07/01/2013 Imaging    CT scan of the chest shows stable appearance.      07/02/2013 Adverse Reaction    Dose of chemotherapy was adjusted further due to severe anemia.      12/31/2013 Imaging    CT scan of the chest show no evidence of disease recurrence      07/03/2014 Imaging    Chest x-ray looks normal      12/31/2014 Imaging    Ct chest showed no evidence of recurrence      05/31/2015 Imaging    Status post resection of a left lower lobe lung lesion without findings for recurrent tumor or metastatic disease.         INTERVAL HISTORY:  Ms. Wiehe reports to the [facility] with her daughter for our initial visit for her history of Stage IB non-small cell lung cancer to the left lower lobe.   Ms. Isais tells me she has been feeling very well.  She has occasional left lateral chest wall "muscle  spasms."  She has sinus problems and a chronic dry cough, which she relates to her allergy to dust.  She has some shortness of breath with exertion, but this has not worsened since her last visit.     She does report hearing loss, which she had prior to her cancer diagnosis and chemotherapy. She was seen at [facility] and given hearing aids, but they were ill-fitting and she does not wear them. She would like our recommendations on a second opinion she can have with an audiologist.  She also has occasional heartburn, which is relieved with Alka Seltzer antiacid tablets.  She also has chronic arthritis pain, which is not worsened since her last visit.    ADDITIONAL REVIEW OF SYSTEMS:  ROS, per HPI  Otherwise, 14-point review of systems completed and is negative.    PAST MEDICAL & SURGICAL HISTORY:  Past Medical History:  Diagnosis Date  . Arthritis   . Benign hypertensive kidney disease with chronic kidney disease stage I through stage IV, or unspecified    sees dr. Justin Mend again in april  . Cancer ([place])    lung ca dx'd 02/2013  . Chronic  kidney disease, stage III (moderate)   . Cough 02/26/2013  . GERD (gastroesophageal reflux disease)   . Headache   . Hypertension   . Hyperthyroidism    took iodine treatment for this  . Lung mass 02/27/2013  . Neurologic disorder    2009  . Shortness of breath    exertion  . Thrombocytopenia, unspecified ([place]) 02/26/2013  . Unspecified vitamin D deficiency    Past Surgical History:  Procedure Laterality Date  . BACK SURGERY    . EYE SURGERY     cataract  . EYE SURGERY Left    open up tear duct  . LYMPH NODE DISSECTION Left 04/07/2013   Procedure: LYMPH NODE DISSECTION;  Surgeon: [name], MD;  Location: [place];  Service: Thoracic;  Laterality: Left;  . NECK SURGERY    . PORT-A-CATH REMOVAL Left 01/09/2014   Procedure: REMOVAL PORT-A-CATH;  Surgeon: [name], MD;  Location: [place];  Service: Thoracic;  Laterality: Left;  .  PORTACATH PLACEMENT Left 05/06/2013   Procedure: INSERTION PORT-A-CATH;  Surgeon: [name], MD;  Location: [facility];  Service: Thoracic;  Laterality: Left;  Marland Kitchen VIDEO ASSISTED THORACOSCOPY (VATS)/ LOBECTOMY Left 04/07/2013   Procedure: VIDEO ASSISTED THORACOSCOPY (VATS)/ LOBECTOMY; INSERTION OF ON-Q PUMP;  Surgeon: [name], MD;  Location: [place];  Service: Thoracic;  Laterality: Left;  Marland Kitchen VIDEO BRONCHOSCOPY N/A 04/07/2013   Procedure: VIDEO BRONCHOSCOPY;  Surgeon: [name], MD;  Location: [facility] OR;  Service: Thoracic;  Laterality: N/A;    SOCIAL HISTORY:  Ms. Stockinger is single and lives in [place], [place].  She had 2 children, 1 son (who is now deceased), and 1 daughter who is alive and well.  She is retired.  Denies any current tobacco or alcohol use.    CURRENT MEDICATIONS:  Current Outpatient Prescriptions on File Prior to Visit  Medication Sig Dispense Refill  . atorvastatin (LIPITOR) 10 MG tablet Take 10 mg by mouth daily at 6 PM.     . chlorthalidone (HYGROTON) 25 MG tablet Take 1 tablet by mouth daily.  4  . Cholecalciferol (VITAMIN D-3) 1000 UNITS CAPS Take 1,000 Units by mouth daily.     . clopidogrel (PLAVIX) 75 MG tablet Take 75 mg by mouth daily with breakfast.     . magnesium oxide (MAG-OX) 400 MG tablet Take 400 mg by mouth daily.    . metoprolol succinate (TOPROL-XL) 25 MG 24 hr tablet Take 25 mg by mouth daily.     . Multiple Vitamin (MULTIVITAMIN) tablet Take 1 tablet by mouth at bedtime.     . TRAVATAN Z 0.004 % SOLN ophthalmic solution Apply 1 drop to eye daily. In affected eye  6  . valsartan (DIOVAN) 80 MG tablet Take 1 tablet (80 mg total) by mouth daily. (Patient taking differently: Take 80 mg by mouth at bedtime. )     No current facility-administered medications on file prior to visit.     ALLERGIES:  Allergies  Allergen Reactions  . Aspirin Other (See Comments)    REACTION: stomach upset  . Lactose Intolerance (Gi) Diarrhea and Other (See  Comments)    "can't drink milk. It tears my stomach up."  . Adhesive [Tape] Rash  . Lisinopril Other (See Comments)    cough  . Percocet [Oxycodone-Acetaminophen] Itching    PHYSICAL EXAM:  Vitals:   12/09/15 1441 12/09/15 1547  BP: (!) 151/55 (!) 162/78  Pulse: 63   Resp: 18   Temp: 97.7  F (36.5 C)    Filed Weights   12/09/15 1441  Weight: 177 lb 11.2 oz (80.6 kg)   General: Well-nourished, well-appearing female in no acute distress.  Accompanied by her daughter today.  HEENT: Head is atraumatic.  Pupils equal and reactive to light. Conjunctivae clear without exudate.  Sclerae anicteric. Oral mucosa is pink and moist without lesions. Oropharynx is pink and moist, without lesions. Lymph: No cervical, supraclavicular, or supraclavicular lymphadenopathy noted on palpation.   Cardiovascular: Normal rate and rhythm. Respiratory: Diminished breath sounds to LLL (as expected). No rhonchi, rales, or wheezes. Chest expansion symmetric without accessory muscle use; breathing non-labored.  GI: Abdomen soft and round. No tenderness to palpation. Bowel sounds normoactive.   GU: Deferred.   Neuro: No focal deficits. Steady gait.   Psych: Normal mood and affect for situation. Extremities: No edema.  Skin: Warm and dry.    LABORATORY DATA:  None at this visit.  DIAGNOSTIC IMAGING:  Most recent CT chest:      ASSESSMENT & PLAN:  Ms. Simoni is a very pleasant 80 y.o. female with history of Stage IB NSCLC to left lower lobe, diagnosed in 2015; treated with left lower lobe resection and lymph node sampling in 03/2013.  She went on to have adjuvant chemotherapy with Navelbine/Cisplatin x 4 cycles; she completed chemo on 07/30/13.  Patient presents to survivorship clinic today for routine surveillance as a long-term cancer survivor.   1. History of Stage IB NSCLC: Ms. Spengler is clinically without evidence of disease.Marland Kitchen  Her last CT chest was in 05/2015 and was negative for recurrence.  She will  be due for annual CT chest without contrast in 05/2016.  She will then follow-up with her medical oncologist, Dr. Alvy Bimler after the CT scan to get the results and for further surveillance.  We discussed that the plan going forward will likely include annual CT scan without contrast in visit at the cancer center until she reaches 5 years since her initial diagnosis (03/2018).  At that time, she may have the choice to "graduate" from follow-up here at the cancer center, but I will defer to Dr. Calton Dach recommendations in the future.  I encouraged her to call me with any questions or concerns before her next visit at the cancer center, and I would be happy to see her sooner if needed.  2. Left chest wall muscle spasms: Ms. Marteney is experiencing periodic left chest wall muscle spasms at the site of her surgical resection scar.  We discussed that the muscle spasms are likely secondary to scar tissue formation.  I encouraged her to do lateral stretches with her arms above her head to stretch the costovertebral musculature and perhaps offer her some relief.  The spasms are not severe, but I think some exercises/stretching could diminish this concern further.    3. Hearing loss: We discussed that Cisplatin chemotherapy can certainly contribute to hearing loss or worsen pre-existing hearing loss.  She tells me that her hearing loss was present prior to her cancer diagnosis, but she was dissatisfied with her experience at [facility] and would like our recommendation on who she could see for a 2nd opinion for consideration of hearing aids.  I think this is reasonable and will touch base with Dr. Alvy Bimler regarding her recommendations.    4. Health maintenance & wellness promotion: She has recently joined the [employer] with [employer] through her insurance.  She is excited about getting started in this program.  We also discussed  the LiveStrong program through the [facility], which is a fitness program designed for cancer  survivors who have completed treatment.   I think LiveStrong would be a great complement to her [employer] S[employer]akers. She was given a brochure with instructions on who to contact if she chooses to enroll in the program.  She was also given the "Nutrition Rainbow" handout, which encourages patients to eat fresh fruits and vegetables and the anti-cancer properties of foods in certain color groups.  Lastly, she was given the "Reduce Your Cancer Risk" booklet from the [facility], which provides recommendations for patients to modify behaviors and reduce risk. She understands the importance of continuing to abstain from tobacco use.   5. Support services: She was given a November calendar of events/programs for here at the cancer center. Encouraged her to take advantage of our support programs.     A total of 35 minutes was spent in face-to-face care of this patient, with greater than 50% of that time spent in counseling and care-coordination.      Mike Craze, NP [place] [phone]

## 2015-12-10 ENCOUNTER — Telehealth: Payer: Self-pay | Admitting: *Deleted

## 2015-12-10 NOTE — Telephone Encounter (Signed)
Called and gave pt the information for ENT physician, Dr. Lucia Gaskins. I gave pt  the telephone number and address to office. Pt repeat info back to me, no further concerns. Message to be fwd to [employer].

## 2015-12-21 DIAGNOSIS — H903 Sensorineural hearing loss, bilateral: Secondary | ICD-10-CM | POA: Diagnosis not present

## 2015-12-27 DIAGNOSIS — R7309 Other abnormal glucose: Secondary | ICD-10-CM | POA: Diagnosis not present

## 2015-12-27 DIAGNOSIS — I129 Hypertensive chronic kidney disease with stage 1 through stage 4 chronic kidney disease, or unspecified chronic kidney disease: Secondary | ICD-10-CM | POA: Diagnosis not present

## 2015-12-27 DIAGNOSIS — Z1389 Encounter for screening for other disorder: Secondary | ICD-10-CM | POA: Diagnosis not present

## 2015-12-27 DIAGNOSIS — J309 Allergic rhinitis, unspecified: Secondary | ICD-10-CM | POA: Diagnosis not present

## 2015-12-27 DIAGNOSIS — N183 Chronic kidney disease, stage 3 (moderate): Secondary | ICD-10-CM | POA: Diagnosis not present

## 2016-03-30 DIAGNOSIS — M21611 Bunion of right foot: Secondary | ICD-10-CM | POA: Diagnosis not present

## 2016-03-30 DIAGNOSIS — L84 Corns and callosities: Secondary | ICD-10-CM | POA: Diagnosis not present

## 2016-04-06 ENCOUNTER — Encounter (HOSPITAL_COMMUNITY): Payer: Self-pay

## 2016-04-06 ENCOUNTER — Emergency Department (HOSPITAL_COMMUNITY)
Admission: EM | Admit: 2016-04-06 | Discharge: 2016-04-06 | Disposition: A | Discharge status: Home / Self Care | Payer: Medicare Other | Attending: Physician Assistant | Admitting: Physician Assistant

## 2016-04-06 ENCOUNTER — Emergency Department (HOSPITAL_COMMUNITY): Payer: Medicare Other

## 2016-04-06 DIAGNOSIS — X500XXA Overexertion from strenuous movement or load, initial encounter: Secondary | ICD-10-CM | POA: Diagnosis not present

## 2016-04-06 DIAGNOSIS — Z79899 Other long term (current) drug therapy: Secondary | ICD-10-CM | POA: Insufficient documentation

## 2016-04-06 DIAGNOSIS — N184 Chronic kidney disease, stage 4 (severe): Secondary | ICD-10-CM | POA: Diagnosis not present

## 2016-04-06 DIAGNOSIS — I129 Hypertensive chronic kidney disease with stage 1 through stage 4 chronic kidney disease, or unspecified chronic kidney disease: Secondary | ICD-10-CM | POA: Diagnosis not present

## 2016-04-06 DIAGNOSIS — Y929 Unspecified place or not applicable: Secondary | ICD-10-CM | POA: Diagnosis not present

## 2016-04-06 DIAGNOSIS — Y999 Unspecified external cause status: Secondary | ICD-10-CM | POA: Diagnosis not present

## 2016-04-06 DIAGNOSIS — Z85118 Personal history of other malignant neoplasm of bronchus and lung: Secondary | ICD-10-CM | POA: Diagnosis not present

## 2016-04-06 DIAGNOSIS — M545 Low back pain, unspecified: Secondary | ICD-10-CM

## 2016-04-06 DIAGNOSIS — M549 Dorsalgia, unspecified: Secondary | ICD-10-CM | POA: Diagnosis not present

## 2016-04-06 DIAGNOSIS — Z87891 Personal history of nicotine dependence: Secondary | ICD-10-CM | POA: Diagnosis not present

## 2016-04-06 DIAGNOSIS — Y939 Activity, unspecified: Secondary | ICD-10-CM | POA: Insufficient documentation

## 2016-04-06 DIAGNOSIS — S3992XA Unspecified injury of lower back, initial encounter: Secondary | ICD-10-CM | POA: Diagnosis not present

## 2016-04-06 MED ORDER — CYCLOBENZAPRINE HCL 10 MG PO TABS: 10.0000 mg | ORAL_TABLET | Freq: Two times a day (BID) | ORAL | 0 refills | Status: DC | PRN

## 2016-04-06 MED ORDER — ACETAMINOPHEN 500 MG PO TABS: 500.0000 mg | ORAL_TABLET | Freq: Four times a day (QID) | ORAL | 0 refills | Status: DC | PRN

## 2016-04-06 MED ORDER — ACETAMINOPHEN 325 MG PO TABS
650.0000 mg | ORAL_TABLET | Freq: Once | ORAL | Status: AC
  Administered 2016-04-06: 650 mg via ORAL
  Filled 2016-04-06: qty 2

## 2016-04-06 NOTE — ED Notes (Signed)
Patient transported to CT 

## 2016-04-06 NOTE — Discharge Instructions (Signed)
Please return with any weakness, numbness, tingling, bowel or bladder issues or other concerns. Please follow up with both her primary care physician and your back surgeon.

## 2016-04-06 NOTE — ED Provider Notes (Signed)
[facility] DEPT Provider Note   CSN: [id number] Arrival date & time: 04/06/16  1122     History   Chief Complaint Chief Complaint  Patient presents with  . Back Pain    HPI [name] is a 81 y.o. female.  HPI   Patient is a 81 year old female presenting with back pain. Patient was looking to the heavy last weakness or numbness of back pain is increased since then. Patient does not have any pain going down herlegs. No numbness and tingling. No weakness. No bowel or bladder issues.  No fevers. History of back surgery 2 years ago.  Past Medical History:  Diagnosis Date  . Arthritis   . Benign hypertensive kidney disease with chronic kidney disease stage I through stage IV, or unspecified(403.10)    sees dr. Justin Mend again in april  . Cancer ([place])    lung ca dx'd 02/2013  . Chronic kidney disease, stage III (moderate)   . Cough 02/26/2013  . GERD (gastroesophageal reflux disease)   . Headache   . Hypertension   . Hyperthyroidism    took iodine treatment for this  . Lung mass 02/27/2013  . Neurologic disorder    2009  . Shortness of breath    exertion  . Thrombocytopenia, unspecified 02/26/2013  . Unspecified vitamin D deficiency     Patient Active Problem List   Diagnosis Date Noted  . Left thyroid nodule 01/01/2015  . Benign hypertension with CKD (chronic kidney disease) stage IV ([place]) 11/06/2013  . Leukocytosis 07/23/2013  . Incisional pain 07/02/2013  . Anemia in chronic kidney disease 05/28/2013  . Constipation 05/28/2013  . History of lung cancer 04/07/2013  . Essential hypertension, benign 03/05/2013  . Personal history of colonic polyps 10/29/2012    Past Surgical History:  Procedure Laterality Date  . BACK SURGERY    . EYE SURGERY     cataract  . EYE SURGERY Left    open up tear duct  . LYMPH NODE DISSECTION Left 04/07/2013   Procedure: LYMPH NODE DISSECTION;  Surgeon: [name], MD;  Location: [facility];  Service: Thoracic;  Laterality: Left;  .  NECK SURGERY    . PORT-A-CATH REMOVAL Left 01/09/2014   Procedure: REMOVAL PORT-A-CATH;  Surgeon: [name], MD;  Location: [facility];  Service: Thoracic;  Laterality: Left;  . PORTACATH PLACEMENT Left 05/06/2013   Procedure: INSERTION PORT-A-CATH;  Surgeon: [name], MD;  Location: [facility];  Service: Thoracic;  Laterality: Left;  Marland Kitchen VIDEO ASSISTED THORACOSCOPY (VATS)/ LOBECTOMY Left 04/07/2013   Procedure: VIDEO ASSISTED THORACOSCOPY (VATS)/ LOBECTOMY; INSERTION OF ON-Q PUMP;  Surgeon: [name], MD;  Location: [place];  Service: Thoracic;  Laterality: Left;  Marland Kitchen VIDEO BRONCHOSCOPY N/A 04/07/2013   Procedure: VIDEO BRONCHOSCOPY;  Surgeon: [name], MD;  Location: [facility] OR;  Service: Thoracic;  Laterality: N/A;    OB History    No data available       Home Medications    Prior to Admission medications   Medication Sig Start Date End Date Taking? Authorizing Provider  atorvastatin (LIPITOR) 10 MG tablet Take 10 mg by mouth daily at 6 PM.  07/02/14   Historical Provider, MD  chlorthalidone (HYGROTON) 25 MG tablet Take 1 tablet by mouth daily. 06/26/14   Historical Provider, MD  Cholecalciferol (VITAMIN D-3) 1000 UNITS CAPS Take 1,000 Units by mouth daily.     Historical Provider, MD  clopidogrel (PLAVIX) 75 MG tablet Take 75 mg by mouth daily with  breakfast.  08/28/12   Historical Provider, MD  magnesium oxide (MAG-OX) 400 MG tablet Take 400 mg by mouth daily.    Historical Provider, MD  metoprolol succinate (TOPROL-XL) 25 MG 24 hr tablet Take 25 mg by mouth daily.  09/30/12   Historical Provider, MD  Multiple Vitamin (MULTIVITAMIN) tablet Take 1 tablet by mouth at bedtime.     Historical Provider, MD  TRAVATAN Z 0.004 % SOLN ophthalmic solution Apply 1 drop to eye daily. In affected eye 05/14/14   Historical Provider, MD  valsartan (DIOVAN) 80 MG tablet Take 1 tablet (80 mg total) by mouth daily. Patient taking differently: Take 80 mg by mouth at bedtime.  03/05/13   Tanda Rockers, MD    Family History Family History  Problem Relation Age of Onset  . Lung cancer Brother     smoked  . Breast cancer Mother   . Lung cancer Father     smoked  . Rectal cancer Neg Hx   . Stomach cancer Neg Hx   . Esophageal cancer Neg Hx   . Colon cancer Neg Hx     Social History Social History  Substance Use Topics  . Smoking status: Former Smoker    Packs/day: 0.50    Years: 60.00    Types: Cigarettes    Quit date: 02/25/2012  . Smokeless tobacco: Never Used  . Alcohol use No     Allergies   Aspirin; Lactose intolerance (gi); Adhesive [tape]; Lisinopril; and Percocet [oxycodone-acetaminophen]   Review of Systems Review of Systems  Constitutional: Negative for fever.  Respiratory: Negative for chest tightness.   Gastrointestinal: Negative for abdominal pain.  Musculoskeletal: Positive for back pain.     Physical Exam Updated Vital Signs BP 141/77 (BP Location: Left Arm)   Pulse 66   Temp 97.6 F (36.4 C) (Oral)   Resp 18   SpO2 100%   Physical Exam  Constitutional: She is oriented to person, place, and time. She appears well-developed and well-nourished.  HENT:  Head: Normocephalic and atraumatic.  Eyes: Right eye exhibits no discharge.  Cardiovascular: Normal rate and regular rhythm.   No murmur heard. Pulmonary/Chest: Effort normal and breath sounds normal. No respiratory distress.  Abdominal: Soft. There is no tenderness.  Musculoskeletal:  Mild tenderness to L spine  Equal strength in bilateral lower extremities. No numbness no tingling. Patient able to walk  Neurological: She is oriented to person, place, and time.  Skin: Skin is warm and dry. She is not diaphoretic.  Psychiatric: She has a normal mood and affect.  Nursing note and vitals reviewed.    ED Treatments / Results  Labs (all labs ordered are listed, but only abnormal results are displayed) Labs Reviewed - No data to display  EKG  EKG Interpretation None        Radiology No results found.  Procedures Procedures (including critical care time)  Medications Ordered in ED Medications  acetaminophen (TYLENOL) tablet 650 mg (650 mg Oral Given 04/06/16 1225)     Initial Impression / Assessment and Plan / ED Course  I have reviewed the triage vital signs and the nursing notes.  Pertinent labs & imaging results that were available during my care of the patient were reviewed by me and considered in my medical decision making (see chart for details).     She is a 81 year old female presenting with back pain. Patient lifted something (case of water) last week and has had back pain since then. Patient has  no red flags. Patient has no trauma. We'll get CT because patient is concerned about the placement of her hardware. We'll give patient Tylenol and give her a muscle relaxant for home and have her follow-up with her primary care physician as well as her back surgeon.  Patietn told of red flag symptosm for returning.   2:08 PM Patient ambulated well without assistance or issue.   Final Clinical Impressions(s) / ED Diagnoses   Final diagnoses:  None    New Prescriptions New Prescriptions   No medications on file     Braeton Wolgamott Julio Alm, MD 04/06/16 1408

## 2016-04-06 NOTE — ED Triage Notes (Signed)
Pt presents with 1 week h/o low back pain.  Pt reports picking up a case of water with onset of pain.  Pt denies any bowel or bladder incontinence; reports back surgery in 2010.

## 2016-04-06 NOTE — ED Notes (Signed)
Pt ambulated independently in the hall way with a steady gait

## 2016-04-12 DIAGNOSIS — M48062 Spinal stenosis, lumbar region with neurogenic claudication: Secondary | ICD-10-CM | POA: Diagnosis not present

## 2016-04-12 DIAGNOSIS — I10 Essential (primary) hypertension: Secondary | ICD-10-CM | POA: Diagnosis not present

## 2016-04-21 ENCOUNTER — Ambulatory Visit (INDEPENDENT_AMBULATORY_CARE_PROVIDER_SITE_OTHER): Payer: Medicare Other | Admitting: Podiatry

## 2016-04-21 ENCOUNTER — Encounter: Payer: Self-pay | Admitting: Podiatry

## 2016-04-21 VITALS — BP 159/91 | HR 74

## 2016-04-21 DIAGNOSIS — Q828 Other specified congenital malformations of skin: Secondary | ICD-10-CM

## 2016-04-21 NOTE — Progress Notes (Signed)
   Subjective:    Patient ID: [name], female    DOB: [birth year], 81 y.o.   MRN: [id number]  HPI this patient presents the office with chief complaint of a painful callus on the bottom of her right foot. She says this callus has been painful for over 2 years and is causing her to have pain in her fifth toe as she walks. She says she has been trimming it herself and using cocoa butter  On  the callus, but the problem returns and continues. She presents the office today for an evaluation and treatment of this painful foot condition    Review of Systems  Constitutional: Positive for appetite change and unexpected weight change.  HENT: Positive for hearing loss.   Eyes: Positive for redness.  Respiratory: Positive for cough, shortness of breath and wheezing.        Difficulty breathing  Cardiovascular: Positive for leg swelling.  Gastrointestinal:       Bloating  Musculoskeletal: Positive for back pain and gait problem.       Muscle pain  Neurological: Positive for light-headedness.  Hematological: Bruises/bleeds easily.       Objective:   Physical Exam GENERAL APPEARANCE: Alert, conversant. Appropriately groomed. No acute distress.  VASCULAR: Pedal pulses are  palpable at  [facility] and PT bilateral.  Capillary refill time is immediate to all digits,  Normal temperature gradient.  Digital hair growth is present bilateral  NEUROLOGIC: sensation is normal to 5.07 monofilament at 5/5 sites bilateral.  Light touch is intact bilateral, Muscle strength normal.  MUSCULOSKELETAL: acceptable muscle strength, tone and stability bilateral.  Intrinsic muscluature intact bilateral.  HAV  B/L.  Plantarflexed fifth metatarsal head  B/L.  DERMATOLOGIC: skin color, texture, and turgor are within normal limits.  No preulcerative lesions or ulcers  are seen, no interdigital maceration noted.  No open lesions present.  Digital nails are asymptomatic. No drainage noted. Porokeratosis  Sub 5th met  B/L.         Assessment & Plan:  Porokeratosis secondary plantarflexed fifth metatarsal.  IE  Discussed condition with patient.  Debrided callus.  Told her to use a pumice stone and to return to the office in 3 months if the problem persists we will consider padded insoles to be worn return to the clinic in 3 months for further evaluation and treatment    Gardiner Barefoot DPM

## 2016-04-26 DIAGNOSIS — R7309 Other abnormal glucose: Secondary | ICD-10-CM | POA: Diagnosis not present

## 2016-04-26 DIAGNOSIS — N183 Chronic kidney disease, stage 3 (moderate): Secondary | ICD-10-CM | POA: Diagnosis not present

## 2016-04-26 DIAGNOSIS — I129 Hypertensive chronic kidney disease with stage 1 through stage 4 chronic kidney disease, or unspecified chronic kidney disease: Secondary | ICD-10-CM | POA: Diagnosis not present

## 2016-04-28 DIAGNOSIS — M5136 Other intervertebral disc degeneration, lumbar region: Secondary | ICD-10-CM | POA: Diagnosis not present

## 2016-04-28 DIAGNOSIS — M48061 Spinal stenosis, lumbar region without neurogenic claudication: Secondary | ICD-10-CM | POA: Diagnosis not present

## 2016-04-28 DIAGNOSIS — M4726 Other spondylosis with radiculopathy, lumbar region: Secondary | ICD-10-CM | POA: Diagnosis not present

## 2016-04-28 DIAGNOSIS — M5416 Radiculopathy, lumbar region: Secondary | ICD-10-CM | POA: Diagnosis not present

## 2016-05-22 DIAGNOSIS — N183 Chronic kidney disease, stage 3 (moderate): Secondary | ICD-10-CM | POA: Diagnosis not present

## 2016-05-29 ENCOUNTER — Other Ambulatory Visit (HOSPITAL_BASED_OUTPATIENT_CLINIC_OR_DEPARTMENT_OTHER): Payer: Medicare Other

## 2016-05-29 ENCOUNTER — Encounter (HOSPITAL_COMMUNITY): Payer: Self-pay

## 2016-05-29 ENCOUNTER — Ambulatory Visit (HOSPITAL_COMMUNITY)
Admission: RE | Admit: 2016-05-29 | Discharge: 2016-05-29 | Disposition: A | Discharge status: Home / Self Care | Payer: Medicare Other | Source: Ambulatory Visit | Attending: Hematology and Oncology | Admitting: Hematology and Oncology

## 2016-05-29 DIAGNOSIS — J439 Emphysema, unspecified: Secondary | ICD-10-CM | POA: Diagnosis not present

## 2016-05-29 DIAGNOSIS — Z85118 Personal history of other malignant neoplasm of bronchus and lung: Secondary | ICD-10-CM | POA: Diagnosis not present

## 2016-05-29 DIAGNOSIS — I700 Atherosclerosis of aorta: Secondary | ICD-10-CM | POA: Insufficient documentation

## 2016-05-29 DIAGNOSIS — Z902 Acquired absence of lung [part of]: Secondary | ICD-10-CM | POA: Insufficient documentation

## 2016-05-29 DIAGNOSIS — C3490 Malignant neoplasm of unspecified part of unspecified bronchus or lung: Secondary | ICD-10-CM | POA: Diagnosis not present

## 2016-05-29 LAB — COMPREHENSIVE METABOLIC PANEL
ALT: 29 U/L (ref 0–55)
ANION GAP: 9 meq/L (ref 3–11)
AST: 28 U/L (ref 5–34)
Albumin: 3.7 g/dL (ref 3.5–5.0)
Alkaline Phosphatase: 68 U/L (ref 40–150)
BUN: 20.8 mg/dL (ref 7.0–26.0)
CALCIUM: 9.5 mg/dL (ref 8.4–10.4)
CHLORIDE: 112 meq/L — AB (ref 98–109)
CO2: 23 mEq/L (ref 22–29)
Creatinine: 1.7 mg/dL — ABNORMAL HIGH (ref 0.6–1.1)
EGFR: 32 mL/min/{1.73_m2} — ABNORMAL LOW (ref >90)
Glucose: 91 mg/dl (ref 70–140)
POTASSIUM: 4.4 meq/L (ref 3.5–5.1)
Sodium: 144 mEq/L (ref 136–145)
Total Bilirubin: 0.24 mg/dL (ref 0.20–1.20)
Total Protein: 6.8 g/dL (ref 6.4–8.3)

## 2016-05-29 LAB — CBC WITH DIFFERENTIAL/PLATELET
BASO%: 0.9 % (ref 0.0–2.0)
BASOS ABS: 0 10*3/uL (ref 0.0–0.1)
EOS%: 3.1 % (ref 0.0–7.0)
Eosinophils Absolute: 0.2 10*3/uL (ref 0.0–0.5)
HEMATOCRIT: 29.2 % — AB (ref 34.8–46.6)
HGB: 9.5 g/dL — ABNORMAL LOW (ref 11.6–15.9)
LYMPH#: 1.4 10*3/uL (ref 0.9–3.3)
LYMPH%: 25.8 % (ref 14.0–49.7)
MCH: 31.3 pg (ref 25.1–34.0)
MCHC: 32.6 g/dL (ref 31.5–36.0)
MCV: 95.8 fL (ref 79.5–101.0)
MONO#: 0.7 10*3/uL (ref 0.1–0.9)
MONO%: 12.4 % (ref 0.0–14.0)
NEUT#: 3.2 10*3/uL (ref 1.5–6.5)
NEUT%: 57.8 % (ref 38.4–76.8)
Platelets: 280 10*3/uL (ref 145–400)
RBC: 3.05 10*6/uL — ABNORMAL LOW (ref 3.70–5.45)
RDW: 17.7 % — ABNORMAL HIGH (ref 11.2–14.5)
WBC: 5.5 10*3/uL (ref 3.9–10.3)

## 2016-05-30 ENCOUNTER — Ambulatory Visit (HOSPITAL_BASED_OUTPATIENT_CLINIC_OR_DEPARTMENT_OTHER): Payer: Medicare Other | Admitting: Hematology and Oncology

## 2016-05-30 ENCOUNTER — Encounter: Payer: Self-pay | Admitting: Hematology and Oncology

## 2016-05-30 ENCOUNTER — Telehealth: Payer: Self-pay | Admitting: Hematology and Oncology

## 2016-05-30 ENCOUNTER — Encounter: Payer: Self-pay | Admitting: Neurology

## 2016-05-30 VITALS — BP 125/48 | HR 73 | Temp 98.0°F | Resp 18 | Ht 60.0 in | Wt 173.0 lb

## 2016-05-30 DIAGNOSIS — M48061 Spinal stenosis, lumbar region without neurogenic claudication: Secondary | ICD-10-CM | POA: Diagnosis not present

## 2016-05-30 DIAGNOSIS — Z85118 Personal history of other malignant neoplasm of bronchus and lung: Secondary | ICD-10-CM

## 2016-05-30 DIAGNOSIS — I129 Hypertensive chronic kidney disease with stage 1 through stage 4 chronic kidney disease, or unspecified chronic kidney disease: Secondary | ICD-10-CM | POA: Diagnosis not present

## 2016-05-30 DIAGNOSIS — R253 Fasciculation: Secondary | ICD-10-CM | POA: Diagnosis not present

## 2016-05-30 DIAGNOSIS — N184 Chronic kidney disease, stage 4 (severe): Secondary | ICD-10-CM

## 2016-05-30 NOTE — Telephone Encounter (Signed)
Gave patient AVS and calender per 5/8 los.

## 2016-05-31 NOTE — Assessment & Plan Note (Signed)
She is doing well with no evidence of disease recurrence. I will see her back in 12 months with repeat blood work, history, physical examination and chest CT

## 2016-05-31 NOTE — Assessment & Plan Note (Signed)
She had CT lumbar spine which shows severe stenosis. It could not explain the cause of her muscle spasm/twitching Recommend she makes follow-up appointment to see her neurosurgeon but I recommend referral to Neurologist for further management and she agreed

## 2016-05-31 NOTE — Progress Notes (Signed)
[place] OFFICE PROGRESS NOTE  Patient Care Team: Glendale Chard, MD as PCP - General (Internal Medicine) Heath Lark, MD as Consulting Physician (Hematology and Oncology) [name], MD as Consulting Physician (Cardiothoracic Surgery)  SUMMARY OF ONCOLOGIC HISTORY: Oncology History   Non-small cell lung cancer, adenocarcinoma   Primary site: Lung (Left)   Staging method: AJCC 7th Edition   Clinical: Stage IB (T2a, N0, M0) signed by Heath Lark, MD on 04/29/2013  9:01 PM   Pathologic: Stage IB (T2a, N0, cM0) signed by [name], MD on 04/09/2013 12:51 PM   Summary: Stage IB (T2a, N0, cM0)       History of lung cancer   02/26/2013 Imaging    CXR for evaluation of cough showed new left lung mass      02/28/2013 Imaging    CT chest showed left new lung nodule      03/14/2013 Imaging    PET/CT scan showed localized disease in the left lung      04/07/2013 Surgery    She underwent bronchoscopy and left lower lobe resection and lymph node sampling with negative margins      05/06/2013 Procedure    The patient has placement of Infuse-a-Port      05/21/2013 - 07/30/2013 Chemotherapy    She completed 4 cycles of adjuvant chemotherapy with navelbine and cisplatin      07/01/2013 Imaging    CT scan of the chest shows stable appearance.      07/02/2013 Adverse Reaction    Dose of chemotherapy was adjusted further due to severe anemia.      12/31/2013 Imaging    CT scan of the chest show no evidence of disease recurrence      07/03/2014 Imaging    Chest x-ray looks normal      12/31/2014 Imaging    Ct chest showed no evidence of recurrence      05/31/2015 Imaging    Status post resection of a left lower lobe lung lesion without findings for recurrent tumor or metastatic disease.       05/29/2016 Imaging    1. Stable exam. Status post left lower lobectomy without evidence for new or progressive findings. 2. Emphysema. 3. Thoracic aortic  atherosclerosis       INTERVAL HISTORY: Please see below for problem oriented charting. She returns with her daughter for further management. She has been doing well. Denies significant neuropathy from prior chemo No recent cough, chest pain or shortness of breath Denies recent infection She did not smoke again since her diagnosis Her only complaint is occasional muscle twitching/spasm.  She was seen by neurosurgeon for management of back pain but it did not seem to help with the back pain or her twitching. CT scan of the lumbar was done recently which show significant lumbar stenosis. She denies recent falls  REVIEW OF SYSTEMS:   Constitutional: Denies fevers, chills or abnormal weight loss Eyes: Denies blurriness of vision Ears, nose, mouth, throat, and face: Denies mucositis or sore throat Respiratory: Denies cough, dyspnea or wheezes Cardiovascular: Denies palpitation, chest discomfort or lower extremity swelling Gastrointestinal:  Denies nausea, heartburn or change in bowel habits Skin: Denies abnormal skin rashes Lymphatics: Denies new lymphadenopathy or easy bruising Neurological:Denies numbness, tingling or new weaknesses Behavioral/Psych: Mood is stable, no new changes  All other systems were reviewed with the patient and are negative.  I have reviewed the past medical history, past surgical history, social history and family history with  the patient and they are unchanged from previous note.  ALLERGIES:  is allergic to aspirin; lactose intolerance (gi); adhesive [tape]; lisinopril; and percocet [oxycodone-acetaminophen].  MEDICATIONS:  Current Outpatient Prescriptions  Medication Sig Dispense Refill  . acetaminophen (TYLENOL) 500 MG tablet Take 1 tablet (500 mg total) by mouth every 6 (six) hours as needed. 30 tablet 0  . atorvastatin (LIPITOR) 10 MG tablet Take 10 mg by mouth daily at 6 PM.     . chlorthalidone (HYGROTON) 25 MG tablet Take 1 tablet by mouth daily.  4   . Cholecalciferol (VITAMIN D-3) 1000 UNITS CAPS Take 1,000 Units by mouth daily.     . clopidogrel (PLAVIX) 75 MG tablet Take 75 mg by mouth daily with breakfast.     . cyclobenzaprine (FLEXERIL) 10 MG tablet Take 1 tablet (10 mg total) by mouth 2 (two) times daily as needed for muscle spasms. 10 tablet 0  . magnesium oxide (MAG-OX) 400 MG tablet Take 400 mg by mouth daily.    . metoprolol succinate (TOPROL-XL) 25 MG 24 hr tablet Take 25 mg by mouth daily.     . Multiple Vitamin (MULTIVITAMIN) tablet Take 1 tablet by mouth at bedtime.     . TRAVATAN Z 0.004 % SOLN ophthalmic solution Apply 1 drop to eye daily. In affected eye  6  . valsartan (DIOVAN) 80 MG tablet Take 1 tablet (80 mg total) by mouth daily. (Patient taking differently: Take 80 mg by mouth at bedtime. )     No current facility-administered medications for this visit.     PHYSICAL EXAMINATION: ECOG PERFORMANCE STATUS: 1 - Symptomatic but completely ambulatory  Vitals:   05/30/16 1218  BP: (!) 125/48  Pulse: 73  Resp: 18  Temp: 98 F (36.7 C)   Filed Weights   05/30/16 1218  Weight: 173 lb (78.5 kg)    GENERAL:alert, no distress and comfortable SKIN: skin color, texture, turgor are normal, no rashes or significant lesions EYES: normal, Conjunctiva are pink and non-injected, sclera clear OROPHARYNX:no exudate, no erythema and lips, buccal mucosa, and tongue normal  NECK: supple, thyroid normal size, non-tender, without nodularity LYMPH:  no palpable lymphadenopathy in the cervical, axillary or inguinal LUNGS: clear to auscultation and percussion with normal breathing effort HEART: regular rate & rhythm and no murmurs and no lower extremity edema ABDOMEN:abdomen soft, non-tender and normal bowel sounds Musculoskeletal:no cyanosis of digits and no clubbing  NEURO: alert & oriented x 3 with fluent speech, no focal motor/sensory deficits.  Note that occasional muscle spasm/twitching  LABORATORY DATA:  I have  reviewed the data as listed    Component Value Date/Time   NA 144 05/29/2016 1119   K 4.4 05/29/2016 1119   CL 105 01/07/2014 1239   CO2 23 05/29/2016 1119   GLUCOSE 91 05/29/2016 1119   BUN 20.8 05/29/2016 1119   CREATININE 1.7 (H) 05/29/2016 1119   CALCIUM 9.5 05/29/2016 1119   PROT 6.8 05/29/2016 1119   ALBUMIN 3.7 05/29/2016 1119   AST 28 05/29/2016 1119   ALT 29 05/29/2016 1119   ALKPHOS 68 05/29/2016 1119   BILITOT 0.24 05/29/2016 1119   GFRNONAA 38 (L) 01/07/2014 1239   GFRAA 44 (L) 01/07/2014 1239    No results found for: SPEP, UPEP  Lab Results  Component Value Date   WBC 5.5 05/29/2016   NEUTROABS 3.2 05/29/2016   HGB 9.5 (L) 05/29/2016   HCT 29.2 (L) 05/29/2016   MCV 95.8 05/29/2016   PLT 280 05/29/2016  Chemistry      Component Value Date/Time   NA 144 05/29/2016 1119   K 4.4 05/29/2016 1119   CL 105 01/07/2014 1239   CO2 23 05/29/2016 1119   BUN 20.8 05/29/2016 1119   CREATININE 1.7 (H) 05/29/2016 1119      Component Value Date/Time   CALCIUM 9.5 05/29/2016 1119   ALKPHOS 68 05/29/2016 1119   AST 28 05/29/2016 1119   ALT 29 05/29/2016 1119   BILITOT 0.24 05/29/2016 1119       RADIOGRAPHIC STUDIES: I have personally reviewed the radiological images as listed and agreed with the findings in the report. Ct Chest Wo Contrast  Result Date: 05/29/2016 CLINICAL DATA:  Lung cancer. EXAM: CT CHEST WITHOUT CONTRAST TECHNIQUE: Multidetector CT imaging of the chest was performed following the standard protocol without IV contrast. COMPARISON:  05/31/2015 FINDINGS: Cardiovascular: The heart size is normal. No pericardial effusion. Coronary artery calcification is noted. Atherosclerotic calcification is noted in the wall of the thoracic aorta. Mediastinum/Nodes: No mediastinal lymphadenopathy. No evidence for gross hilar lymphadenopathy although assessment is limited by the lack of intravenous contrast on today's study. There is no axillary lymphadenopathy.  Asymmetric nodular enlargement left thyroid lobe is stable. The esophagus has normal imaging features. Lungs/Pleura: Emphysema is associated with bronchial wall thickening. 3 mm right upper lobe nodule (image 49 series 5) is stable in the interval. Volume loss left hemithorax compatible with left lower lobectomy. Scarring in the lower left lung is unchanged. No focal airspace consolidation. No pulmonary edema or pleural effusion. Upper Abdomen: 10 mm low-density lesion posterior right liver is stable in the interval. Musculoskeletal: Bone windows reveal no worrisome lytic or sclerotic osseous lesions. IMPRESSION: 1. Stable exam. Status post left lower lobectomy without evidence for new or progressive findings. 2. Emphysema. 3. Thoracic aortic atherosclerosis. Electronically Signed   By: Misty Stanley M.D.   On: 05/29/2016 15:25    ASSESSMENT & PLAN:  History of lung cancer She is doing well with no evidence of disease recurrence. I will see her back in 12 months with repeat blood work, history, physical examination and chest CT  Benign hypertension with CKD (chronic kidney disease) stage IV ([place]) She have chronic kidney disease stage IV, related to side effects of chemotherapy and chronic hypertension. Her Kidney function is stable. Continue aggressive risk factor modification.  Muscle twitching She had CT lumbar spine which shows severe stenosis. It could not explain the cause of her muscle spasm/twitching Recommend she makes follow-up appointment to see her neurosurgeon but I recommend referral to Neurologist for further management and she agreed   Orders Placed This Encounter  Procedures  . CT CHEST WO CONTRAST    Standing Status:   Future    Standing Expiration Date:   07/30/2017    Order Specific Question:   Reason for Exam (SYMPTOM  OR DIAGNOSIS REQUIRED)    Answer:   staging lung ca, exclude rcurrence    Order Specific Question:   Preferred imaging location?    Answer:   [facility]  . Comprehensive metabolic panel    Standing Status:   Future    Standing Expiration Date:   07/30/2017  . CBC with Differential/Platelet    Standing Status:   Future    Standing Expiration Date:   07/30/2017  . Ambulatory referral to Neurology    Referral Priority:   Routine    Referral Type:   Consultation    Referral Reason:   Specialty Services Required  Requested Specialty:   Neurology    Number of Visits Requested:   1   All questions were answered. The patient knows to call the clinic with any problems, questions or concerns. No barriers to learning was detected. I spent 20 minutes counseling the patient face to face. The total time spent in the appointment was 25 minutes and more than 50% was on counseling and review of test results     Heath Lark, MD 05/31/2016 9:16 AM

## 2016-05-31 NOTE — Assessment & Plan Note (Signed)
She have chronic kidney disease stage IV, related to side effects of chemotherapy and chronic hypertension. Her Kidney function is stable. Continue aggressive risk factor modification.

## 2016-06-21 DIAGNOSIS — N2581 Secondary hyperparathyroidism of renal origin: Secondary | ICD-10-CM | POA: Diagnosis not present

## 2016-06-21 DIAGNOSIS — D631 Anemia in chronic kidney disease: Secondary | ICD-10-CM | POA: Diagnosis not present

## 2016-06-21 DIAGNOSIS — N183 Chronic kidney disease, stage 3 (moderate): Secondary | ICD-10-CM | POA: Diagnosis not present

## 2016-07-28 ENCOUNTER — Ambulatory Visit: Payer: Medicare Other | Admitting: Podiatry

## 2016-08-03 ENCOUNTER — Encounter: Payer: Medicare Other | Admitting: Cardiothoracic Surgery

## 2016-08-04 ENCOUNTER — Ambulatory Visit: Payer: Medicare Other | Admitting: Neurology

## 2016-08-07 ENCOUNTER — Ambulatory Visit: Payer: Medicare Other | Admitting: Cardiothoracic Surgery

## 2016-08-17 ENCOUNTER — Ambulatory Visit: Payer: Medicare Other | Admitting: Neurology

## 2016-08-23 ENCOUNTER — Other Ambulatory Visit: Payer: Self-pay | Admitting: Internal Medicine

## 2016-08-23 DIAGNOSIS — Z1231 Encounter for screening mammogram for malignant neoplasm of breast: Secondary | ICD-10-CM

## 2016-08-31 ENCOUNTER — Ambulatory Visit (INDEPENDENT_AMBULATORY_CARE_PROVIDER_SITE_OTHER): Payer: Medicare Other | Admitting: Cardiothoracic Surgery

## 2016-08-31 ENCOUNTER — Encounter: Payer: Self-pay | Admitting: Cardiothoracic Surgery

## 2016-08-31 VITALS — BP 146/57 | HR 60 | Resp 20 | Ht 60.0 in | Wt 172.0 lb

## 2016-08-31 DIAGNOSIS — C3492 Malignant neoplasm of unspecified part of left bronchus or lung: Secondary | ICD-10-CM

## 2016-08-31 NOTE — Progress Notes (Signed)
[address]       [place] [address]             [phone]      Anny Kessenich Winfred Medical Record #[id number] Date of Birth: 23-Sep-1935  Referring: Tanda Rockers, MD Primary Care: Glendale Chard, MD  Chief Complaint:   POST OP FOLLOW UP 04/07/2013  OPERATIVE REPORT  Preop diagnosis: Left lower lobe lung mass suspicious for primary carcinoma the lung  Postop diagnosis: Same, non-small cell lung cancer probably adenocarcinoma by frozen section  Procedure:  Bronchoscopy  Left video-assisted thoracoscopy  Left lower lobectomy and lymph node dissection  Placement of On-Q device  Surgeon: [name] MD   Stage IB ( pT2a,pN0,cM0)  Diagnosis 1. Lung, resection (segmental or lobe), Left lower lobe - INVASIVE ADENOCARCINOMA, POORLY DIFFERENTIATED, SPANNING 4.1 CM. - ADENOCARCINOMA INVOLVES PLEURA. - THE SURGICAL RESECTION MARGINS ARE NEGATIVE FOR ADENOCARCINOMA. - ONE BENIGN HILAR LYMPH NODE (0/1). - SEE ONCOLOGY TABLE BELOW. 2. Lymph node, biopsy, 8 L - THERE IS NO EVIDENCE OF CARCINOMA IN 1 OF 1 LYMPH NODE (0/1). 3. Lymph node, biopsy, 11 L - THERE IS NO EVIDENCE OF CARCINOMA IN 1 OF 1 LYMPH NODE (0/1). 4. Lymph node, biopsy, 12 L - THERE IS NO EVIDENCE OF CARCINOMA IN 1 OF 1 LYMPH NODE (0/1). 5. Lymph node, biopsy, 10 L - THERE IS NO EVIDENCE OF CARCINOMA IN 1 OF 1 LYMPH NODE (0/1). 6. Lymph node, biopsy, 4 L - THERE IS NO EVIDENCE OF CARCINOMA IN 1 OF 1 LYMPH NODE (0/1).  History of Present Illness:       Patient remains active 81 years. She continues to cut her grass with a push mower. She has been bothered by some low back pain with recent back injection. Following surgical resection 41 months ago She completed course of chemo in OCT 2015. She completed 4 cycles of adjuvant chemotherapy with navelbine and cisplatin , now is 41  months postop.    Past Medical History:  Diagnosis Date  . Arthritis   . Benign hypertensive kidney  disease with chronic kidney disease stage I through stage IV, or unspecified(403.10)    sees dr. Justin Mend again in april  . Cancer ([place])    lung ca dx'd 02/2013  . Chronic kidney disease, stage III (moderate)   . Cough 02/26/2013  . GERD (gastroesophageal reflux disease)   . Headache   . Hypertension   . Hyperthyroidism    took iodine treatment for this  . Lung mass 02/27/2013  . Neurologic disorder    2009  . Shortness of breath    exertion  . Thrombocytopenia, unspecified ([place]) 02/26/2013  . Unspecified vitamin D deficiency      History  Smoking Status  . Former Smoker  . Packs/day: 0.50  . Years: 60.00  . Types: Cigarettes  . Quit date: 02/25/2012  Smokeless Tobacco  . Never Used    History  Alcohol Use No     Allergies  Allergen Reactions  . Aspirin Other (See Comments)    REACTION: stomach upset  . Lactose Intolerance (Gi) Diarrhea and Other (See Comments)    "can't drink milk. It tears my stomach up."  . Adhesive [Tape] Rash  . Lisinopril Other (See Comments)    cough  . Percocet [Oxycodone-Acetaminophen] Itching    Current Outpatient Prescriptions  Medication Sig Dispense Refill  . acetaminophen (TYLENOL) 500 MG tablet Take 1 tablet (500 mg total) by  mouth every 6 (six) hours as needed. 30 tablet 0  . atorvastatin (LIPITOR) 10 MG tablet Take 10 mg by mouth daily at 6 PM.     . chlorthalidone (HYGROTON) 25 MG tablet Take 1 tablet by mouth daily.  4  . Cholecalciferol (VITAMIN D-3) 1000 UNITS CAPS Take 1,000 Units by mouth daily.     . clopidogrel (PLAVIX) 75 MG tablet Take 75 mg by mouth daily with breakfast.     . cyclobenzaprine (FLEXERIL) 10 MG tablet Take 1 tablet (10 mg total) by mouth 2 (two) times daily as needed for muscle spasms. 10 tablet 0  . magnesium oxide (MAG-OX) 400 MG tablet Take 400 mg by mouth daily.    . metoprolol succinate (TOPROL-XL) 25 MG 24 hr tablet Take 25 mg by mouth daily.     . Multiple Vitamin (MULTIVITAMIN) tablet Take 1 tablet by  mouth at bedtime.     Marland Kitchen telmisartan (MICARDIS) 40 MG tablet     . TRAVATAN Z 0.004 % SOLN ophthalmic solution Apply 1 drop to eye daily. In affected eye  6   No current facility-administered medications for this visit.        Physical Exam: BP (!) 146/57   Pulse 60   Resp 20   Ht 5' (1.524 m)   Wt 172 lb (78 kg)   SpO2 95%   BMI 33.59 kg/m   General appearance: alert, cooperative and appears stated age Neurologic: intact Heart: regular rate and rhythm, S1, S2 normal, no murmur, click, rub or gallop Lungs: clear to auscultation bilaterally Abdomen: soft, non-tender; bowel sounds normal; no masses,  no organomegaly Extremities: extremities normal, atraumatic, no cyanosis or edema and Homans sign is negative, no sign of DVT Wound: wounds well healed    Diagnostic Studies & Laboratory data:     Recent Radiology Findings: Study Result   CLINICAL DATA:  Lung cancer.  EXAM: CT CHEST WITHOUT CONTRAST  TECHNIQUE: Multidetector CT imaging of the chest was performed following the standard protocol without IV contrast.  COMPARISON:  05/31/2015  FINDINGS: Cardiovascular: The heart size is normal. No pericardial effusion. Coronary artery calcification is noted. Atherosclerotic calcification is noted in the wall of the thoracic aorta.  Mediastinum/Nodes: No mediastinal lymphadenopathy. No evidence for gross hilar lymphadenopathy although assessment is limited by the lack of intravenous contrast on today's study. There is no axillary lymphadenopathy. Asymmetric nodular enlargement left thyroid lobe is stable. The esophagus has normal imaging features.  Lungs/Pleura: Emphysema is associated with bronchial wall thickening. 3 mm right upper lobe nodule (image 49 series 5) is stable in the interval. Volume loss left hemithorax compatible with left lower lobectomy. Scarring in the lower left lung is unchanged. No focal airspace consolidation. No pulmonary edema or  pleural effusion.  Upper Abdomen: 10 mm low-density lesion posterior right liver is stable in the interval.  Musculoskeletal: Bone windows reveal no worrisome lytic or sclerotic osseous lesions.  IMPRESSION: 1. Stable exam. Status post left lower lobectomy without evidence for new or progressive findings. 2. Emphysema. 3. Thoracic aortic atherosclerosis.   Electronically Signed   By: Misty Stanley M.D.   On: 05/29/2016 15:25    I have independently reviewed the above radiology studies  and reviewed the findings with the patient.   CLINICAL DATA:  Restaging non small cell lung cancer. Status post left lower lobe resection. Chemotherapy complete.  EXAM: CT CHEST WITHOUT CONTRAST  TECHNIQUE: Multidetector CT imaging of the chest was performed following the standard protocol without  IV contrast.  COMPARISON:  12/31/2014  FINDINGS: Mediastinum/Nodes: No breast masses, supraclavicular or axillary lymphadenopathy. Stable multinodular goiter involving the left thyroid lobe.  The heart is normal in size. No pericardial effusion. Stable aortic and coronary artery calcifications.  No mediastinal or hilar mass or adenopathy. Small scattered lymph nodes are stable. The esophagus is grossly normal. There is a small hiatal hernia.  Lungs/Pleura: Stable emphysematous changes and pulmonary scarring. Stable surgical changes from a left lower lobe lung resection. Stable left basilar pleural and parenchymal scarring changes. No findings worrisome for recurrent tumor. No metastatic pulmonary nodules. No bronchiectasis or interstitial lung disease.  Upper abdomen: No significant findings.  Musculoskeletal: No significant findings. Stable degenerative changes involving the spine.  IMPRESSION: Status post resection of a left lower lobe lung lesion without findings for recurrent tumor or metastatic disease.  No mediastinal or hilar mass or adenopathy.  Stable  emphysematous changes but no acute pulmonary findings.  Stable atherosclerotic calcifications involving the aorta and branch vessels.  Stable left thyroid goiter.   Electronically Signed   By: Marijo Sanes M.D.   On: 05/31/2015 15:42     Ct Chest Wo Contrast  12/31/2014  CLINICAL DATA:  Subsequent evaluation of a 81 year old female with history of non-small cell carcinoma both the left lower lobe diagnosed in January 2015, status post left lower lobectomy and chemotherapy (completed). EXAM: CT CHEST WITHOUT CONTRAST TECHNIQUE: Multidetector CT imaging of the chest was performed following the standard protocol without IV contrast. COMPARISON:  Chest CT 07/23/2014. FINDINGS: Mediastinum/Lymph Nodes: Heart size is normal. There is no significant pericardial fluid, thickening or pericardial calcification. There is atherosclerosis of the thoracic aorta, the great vessels of the mediastinum and the coronary arteries, including calcified atherosclerotic plaque in the left anterior descending, left circumflex and right coronary arteries. No pathologically enlarged mediastinal or hilar lymph nodes. Please note that accurate exclusion of hilar adenopathy is limited on noncontrast CT scans. Asymmetrically enlarged left lobe of thyroid gland which appears to be occupied by a dominant partially calcified nodule, which is slightly larger than the prior examination, measuring 2.8 x 3.2 cm on today's study (image 8 of series 2). Small hiatal hernia. No axillary lymphadenopathy. Lungs/Pleura: Status post left lower lobectomy. Compensatory hyperexpansion of the left upper lobe. Small amount of peripheral pleuroparenchymal thickening in the inferior aspect of the left upper lobe, similar to the prior study, most compatible with some mild chronic scarring. No suspicious appearing pulmonary nodules or masses. No acute consolidative airspace disease. No pleural effusions. Mild diffuse bronchial wall thickening with  mild centrilobular and paraseptal emphysema. Upper Abdomen: Atherosclerosis. 4 mm low-attenuation lesion in segment 2 of the liver again noted, incompletely characterized on today's noncontrast CT examination, but likely a tiny cyst. Musculoskeletal/Soft Tissues: Orthopedic fixation hardware in the lower cervical spine incompletely imaged. There are no aggressive appearing lytic or blastic lesions noted in the visualized portions of the skeleton. IMPRESSION: 1. Postoperative changes of left lower lobectomy. No findings to suggest recurrent or metastatic disease in the thorax. 2. Slight enlargement of a heterogeneous appearing nodule in the left lobe of the thyroid gland which is partially calcified measuring 2.8 x 3.2 cm on today's study. Further evaluation with nonemergent thyroid ultrasound is recommended at this time. 3. Mild diffuse bronchial wall thickening with mild centrilobular and paraseptal emphysema; imaging findings suggestive of underlying COPD. 4. Atherosclerosis, including three-vessel coronary artery disease. Assessment for potential risk factor modification, dietary therapy or pharmacologic therapy may be warranted, if clinically  indicated. Electronically Signed   By: Vinnie Langton M.D.   On: 12/31/2014 14:54     CLINICAL DATA: Lung cancer, completed chemotherapy. Shortness of breath.  EXAM: CT CHEST WITH CONTRAST  TECHNIQUE: Multidetector CT imaging of the chest was performed during intravenous contrast administration.  CONTRAST: 24mL OMNIPAQUE IOHEXOL 300 MG/ML SOLN  COMPARISON: 07/01/2013  FINDINGS: 3.1 by 2.6 cm solid and partially calcified left thyroid nodule, stable.  Left subclavian Port-A-Cath tip: SVC.  Atherosclerotic aortic arch. Coronary artery atherosclerosis observed in the left anterior descending artery.  The trace left pleural effusion has nearly completely resolved. Focal pleural thickening or adjacent mild rounded atelectasis in  the left lower lung is reduced in size and prominence on image 35 of series 2. This currently measures up to 6 mm in thickness.  No pathologic thoracic adenopathy.  Left lower lobectomy.  Indistinct 6 by 7 mm by 4 mm ground-glass density in the left lung on image 32 of series 5 posteriorly in, stable.  Thoracic spondylosis. Lower cervical plate and screw fixator.  Centrilobular emphysema.  Small focus of enhancement along the left side of the lateral segment left hepatic lobe, image 48 of series 2, stable, not previously hypermetabolic on prior PET-CT, probably flash filling of a hemangioma or a small perfusion anomaly.  IMPRESSION: 1. No findings of recurrence or significant change. 6 by 7 by 4 mm slightly sub solid left posterior nodule merits observation. 2. Stable left thyroid nodule. This was not previously hypermetabolic. 3. Stable small enhancing focus in the lateral segment left hepatic lobe, probably a flash filling hemangioma or perfusion anomaly, not previously hypermetabolic. 4. Centrilobular emphysema. 5. Left lower lobectomy. Focal scarring posteriorly in the remaining lower posterior left lung, with reduced size of the trace pleural effusion. 6. Atherosclerosis.   Electronically Signed  By: Sherryl Barters M.D.  On: 12/31/2013 15:39  Recent Lab Findings: Lab Results  Component Value Date   WBC 5.5 05/29/2016   HGB 9.5 (L) 05/29/2016   HCT 29.2 (L) 05/29/2016   PLT 280 05/29/2016   GLUCOSE 91 05/29/2016   ALT 29 05/29/2016   AST 28 05/29/2016   NA 144 05/29/2016   K 4.4 05/29/2016   CL 105 01/07/2014   CREATININE 1.7 (H) 05/29/2016   BUN 20.8 05/29/2016   CO2 23 05/29/2016   INR 0.96 05/06/2013      Assessment / Plan:   Patient doing well following left lower lobectomy for a 4.2 cm adenocarcinoma stage IB 50months ago, No evidence of recurrence by symptoms or by CT scan several months ago. I plan to see the patient back in one  year   [name] MD      [address] [place] [address] Office [phone]   Beeper [phone]  08/31/2016 9:28 AM

## 2016-09-06 DIAGNOSIS — H402232 Chronic angle-closure glaucoma, bilateral, moderate stage: Secondary | ICD-10-CM | POA: Diagnosis not present

## 2016-10-02 ENCOUNTER — Ambulatory Visit
Admission: RE | Admit: 2016-10-02 | Discharge: 2016-10-02 | Disposition: A | Discharge status: Home / Self Care | Payer: Medicare Other | Source: Ambulatory Visit | Attending: Internal Medicine | Admitting: Internal Medicine

## 2016-10-02 DIAGNOSIS — Z1231 Encounter for screening mammogram for malignant neoplasm of breast: Secondary | ICD-10-CM

## 2016-10-03 ENCOUNTER — Other Ambulatory Visit: Payer: Self-pay | Admitting: Internal Medicine

## 2016-10-03 ENCOUNTER — Ambulatory Visit
Admission: RE | Admit: 2016-10-03 | Discharge: 2016-10-03 | Disposition: A | Discharge status: Home / Self Care | Payer: Medicare Other | Source: Ambulatory Visit | Attending: Internal Medicine | Admitting: Internal Medicine

## 2016-10-03 DIAGNOSIS — M1611 Unilateral primary osteoarthritis, right hip: Secondary | ICD-10-CM | POA: Diagnosis not present

## 2016-10-03 DIAGNOSIS — Z1389 Encounter for screening for other disorder: Secondary | ICD-10-CM | POA: Diagnosis not present

## 2016-10-03 DIAGNOSIS — M25551 Pain in right hip: Secondary | ICD-10-CM

## 2016-10-03 DIAGNOSIS — Z0000 Encounter for general adult medical examination without abnormal findings: Secondary | ICD-10-CM | POA: Diagnosis not present

## 2016-10-03 DIAGNOSIS — E559 Vitamin D deficiency, unspecified: Secondary | ICD-10-CM | POA: Diagnosis not present

## 2016-10-03 DIAGNOSIS — Z23 Encounter for immunization: Secondary | ICD-10-CM | POA: Diagnosis not present

## 2016-10-03 DIAGNOSIS — I129 Hypertensive chronic kidney disease with stage 1 through stage 4 chronic kidney disease, or unspecified chronic kidney disease: Secondary | ICD-10-CM | POA: Diagnosis not present

## 2016-10-03 DIAGNOSIS — N183 Chronic kidney disease, stage 3 (moderate): Secondary | ICD-10-CM | POA: Diagnosis not present

## 2016-10-03 DIAGNOSIS — R7309 Other abnormal glucose: Secondary | ICD-10-CM | POA: Diagnosis not present

## 2016-10-23 DIAGNOSIS — M25551 Pain in right hip: Secondary | ICD-10-CM | POA: Diagnosis not present

## 2016-10-23 DIAGNOSIS — M1611 Unilateral primary osteoarthritis, right hip: Secondary | ICD-10-CM | POA: Diagnosis not present

## 2016-10-23 DIAGNOSIS — M545 Low back pain: Secondary | ICD-10-CM | POA: Diagnosis not present

## 2016-10-24 ENCOUNTER — Other Ambulatory Visit: Payer: Self-pay | Admitting: Family Medicine

## 2016-10-24 ENCOUNTER — Other Ambulatory Visit: Payer: Self-pay | Admitting: Hematology and Oncology

## 2016-10-24 DIAGNOSIS — M545 Low back pain: Secondary | ICD-10-CM

## 2016-10-24 DIAGNOSIS — M1611 Unilateral primary osteoarthritis, right hip: Secondary | ICD-10-CM

## 2016-10-30 ENCOUNTER — Ambulatory Visit: Payer: Medicare Other | Admitting: Neurology

## 2016-11-08 ENCOUNTER — Ambulatory Visit
Admission: RE | Admit: 2016-11-08 | Discharge: 2016-11-08 | Disposition: A | Discharge status: Home / Self Care | Payer: Medicare Other | Source: Ambulatory Visit | Attending: Hematology and Oncology | Admitting: Hematology and Oncology

## 2016-11-08 ENCOUNTER — Ambulatory Visit
Admission: RE | Admit: 2016-11-08 | Discharge: 2016-11-08 | Disposition: A | Discharge status: Home / Self Care | Payer: Medicare Other | Source: Ambulatory Visit | Attending: Family Medicine | Admitting: Family Medicine

## 2016-11-08 DIAGNOSIS — M1611 Unilateral primary osteoarthritis, right hip: Secondary | ICD-10-CM

## 2016-11-08 DIAGNOSIS — M160 Bilateral primary osteoarthritis of hip: Secondary | ICD-10-CM | POA: Diagnosis not present

## 2016-11-08 DIAGNOSIS — M48061 Spinal stenosis, lumbar region without neurogenic claudication: Secondary | ICD-10-CM | POA: Diagnosis not present

## 2016-11-08 DIAGNOSIS — M545 Low back pain: Secondary | ICD-10-CM

## 2016-11-13 DIAGNOSIS — M1611 Unilateral primary osteoarthritis, right hip: Secondary | ICD-10-CM | POA: Diagnosis not present

## 2016-11-13 DIAGNOSIS — M545 Low back pain: Secondary | ICD-10-CM | POA: Diagnosis not present

## 2017-01-15 ENCOUNTER — Ambulatory Visit (INDEPENDENT_AMBULATORY_CARE_PROVIDER_SITE_OTHER): Payer: Medicare Other

## 2017-01-15 ENCOUNTER — Other Ambulatory Visit: Payer: Self-pay

## 2017-01-15 ENCOUNTER — Encounter (HOSPITAL_COMMUNITY): Payer: Self-pay | Admitting: Emergency Medicine

## 2017-01-15 ENCOUNTER — Ambulatory Visit (HOSPITAL_COMMUNITY)
Admission: EM | Admit: 2017-01-15 | Discharge: 2017-01-15 | Disposition: A | Discharge status: Home / Self Care | Payer: Medicare Other | Attending: Internal Medicine | Admitting: Internal Medicine

## 2017-01-15 DIAGNOSIS — M25561 Pain in right knee: Secondary | ICD-10-CM

## 2017-01-15 DIAGNOSIS — M179 Osteoarthritis of knee, unspecified: Secondary | ICD-10-CM | POA: Diagnosis not present

## 2017-01-15 MED ORDER — ACETAMINOPHEN 500 MG PO TABS: 500.0000 mg | ORAL_TABLET | Freq: Four times a day (QID) | ORAL | 0 refills | Status: DC | PRN

## 2017-01-15 MED ORDER — DICLOFENAC SODIUM 1 % TD GEL: 2.0000 g | Freq: Four times a day (QID) | TRANSDERMAL | 0 refills | Status: DC

## 2017-01-15 NOTE — Discharge Instructions (Signed)
Ice, elevate for swelling improvement. Use of knee sleeve with activity may provide some additional support and compression. Topical Voltaren gel may help with pain. Oral tylenol as needed. Please continue to follow with your primary care provider for management of chronic pain of your back and knees

## 2017-01-15 NOTE — ED Triage Notes (Signed)
Patient reports bilateral knee pain 3 weeks ago.  No known injury.  Patient has been told she has arthritis in back.

## 2017-01-15 NOTE — ED Provider Notes (Signed)
[place]    CSN: [id number] Arrival date & time: 01/15/17  1118     History   Chief Complaint Chief Complaint  Patient presents with  . Knee Pain    HPI [name] is a 81 y.o. female.   [name] presents with family with complaints of right knee pain which has been worsening for the past month. She states she has a history of low back pain and arthritis with pain radiating down posterior thighs and pins and needles to left foot. She states her back pain has improved, but now with knee pain. No known injury to the knee. It is worse with weight bearing. Rates pain 9/10. Has had swelling. Takes tramadol which does not help. Took a [employer] arthritis pill which helped some, does not know what it is. Has not yet started physical therapy as prescribed by her PCP. She is on plavix.    ROS per HPI.       Past Medical History:  Diagnosis Date  . Arthritis   . Benign hypertensive kidney disease with chronic kidney disease stage I through stage IV, or unspecified(403.10)    sees dr. Justin Mend again in april  . Cancer ([place])    lung ca dx'd 02/2013  . Chronic kidney disease, stage III (moderate) (HCC)   . Cough 02/26/2013  . GERD (gastroesophageal reflux disease)   . Headache   . Hypertension   . Hyperthyroidism    took iodine treatment for this  . Lung mass 02/27/2013  . Neurologic disorder    2009  . Shortness of breath    exertion  . Thrombocytopenia, unspecified ([place]) 02/26/2013  . Unspecified vitamin D deficiency     Patient Active Problem List   Diagnosis Date Noted  . Muscle twitching 05/30/2016  . Left thyroid nodule 01/01/2015  . Benign hypertension with CKD (chronic kidney disease) stage IV ([place]) 11/06/2013  . Leukocytosis 07/23/2013  . Incisional pain 07/02/2013  . Anemia in chronic kidney disease 05/28/2013  . Constipation 05/28/2013  . History of lung cancer 04/07/2013  . Essential hypertension, benign 03/05/2013  . Personal history of colonic polyps  10/29/2012    Past Surgical History:  Procedure Laterality Date  . BACK SURGERY    . EYE SURGERY     cataract  . EYE SURGERY Left    open up tear duct  . LYMPH NODE DISSECTION Left 04/07/2013   Procedure: LYMPH NODE DISSECTION;  Surgeon: [name], MD;  Location: [facility];  Service: Thoracic;  Laterality: Left;  . NECK SURGERY    . PORT-A-CATH REMOVAL Left 01/09/2014   Procedure: REMOVAL PORT-A-CATH;  Surgeon: [name], MD;  Location: [facility];  Service: Thoracic;  Laterality: Left;  . PORTACATH PLACEMENT Left 05/06/2013   Procedure: INSERTION PORT-A-CATH;  Surgeon: [name], MD;  Location: [place];  Service: Thoracic;  Laterality: Left;  Marland Kitchen VIDEO ASSISTED THORACOSCOPY (VATS)/ LOBECTOMY Left 04/07/2013   Procedure: VIDEO ASSISTED THORACOSCOPY (VATS)/ LOBECTOMY; INSERTION OF ON-Q PUMP;  Surgeon: [name], MD;  Location: [facility];  Service: Thoracic;  Laterality: Left;  Marland Kitchen VIDEO BRONCHOSCOPY N/A 04/07/2013   Procedure: VIDEO BRONCHOSCOPY;  Surgeon: [name], MD;  Location[facility];  Service: Thoracic;  Laterality: N/A;    OB History    No data available       Home Medications    Prior to Admission medications   Medication Sig Start Date End Date Taking? Authorizing Provider  acetaminophen (TYLENOL) 500 MG  tablet Take 1 tablet (500 mg total) by mouth every 6 (six) hours as needed. 01/15/17   Zigmund Gottron, NP  atorvastatin (LIPITOR) 10 MG tablet Take 10 mg by mouth daily at 6 PM.  07/02/14   [provider]  chlorthalidone (HYGROTON) 25 MG tablet Take 1 tablet by mouth daily. 06/26/14   [provider]  Cholecalciferol (VITAMIN D-3) 1000 UNITS CAPS Take 1,000 Units by mouth daily.     [provider]  clopidogrel (PLAVIX) 75 MG tablet Take 75 mg by mouth daily with breakfast.  08/28/12   [provider]  cyclobenzaprine (FLEXERIL) 10 MG tablet Take 1 tablet (10 mg total) by mouth 2 (two) times daily as needed for muscle  spasms. 04/06/16   Mackuen, Courteney Lyn, MD  diclofenac sodium (VOLTAREN) 1 % GEL Apply 2 g topically 4 (four) times daily. 01/15/17   Augusto Gamble B, NP  magnesium oxide (MAG-OX) 400 MG tablet Take 400 mg by mouth daily.    [provider]  metoprolol succinate (TOPROL-XL) 25 MG 24 hr tablet Take 25 mg by mouth daily.  09/30/12   [provider]  Multiple Vitamin (MULTIVITAMIN) tablet Take 1 tablet by mouth at bedtime.     [provider]  telmisartan (MICARDIS) 40 MG tablet  08/30/16   [provider]  TRAVATAN Z 0.004 % SOLN ophthalmic solution Apply 1 drop to eye daily. In affected eye 05/14/14   [provider]    Family History Family History  Problem Relation Age of Onset  . Lung cancer Brother        smoked  . Breast cancer Mother   . Lung cancer Father        smoked  . Rectal cancer Neg Hx   . Stomach cancer Neg Hx   . Esophageal cancer Neg Hx   . Colon cancer Neg Hx     Social History Social History   Tobacco Use  . Smoking status: Former Smoker    Packs/day: 0.50    Years: 60.00    Pack years: 30.00    Types: Cigarettes    Last attempt to quit: 02/25/2012    Years since quitting: 4.8  . Smokeless tobacco: Never Used  Substance Use Topics  . Alcohol use: No  . Drug use: No     Allergies   Aspirin; Lactose intolerance (gi); Adhesive [tape]; Lisinopril; and Percocet [oxycodone-acetaminophen]   Review of Systems Review of Systems   Physical Exam Triage Vital Signs ED Triage Vitals  Enc Vitals Group     BP 01/15/17 1219 (!) 155/71     Pulse Rate 01/15/17 1219 (!) 59     Resp 01/15/17 1219 20     Temp 01/15/17 1219 (!) 97.3 F (36.3 C)     Temp Source 01/15/17 1219 Oral     SpO2 01/15/17 1219 100 %     Weight --      Height --      Head Circumference --      Peak Flow --      Pain Score 01/15/17 1217 9     Pain Loc --      Pain Edu? --      Excl. in [place]? --    No data found.  Updated Vital Signs BP  (!) 155/71 (BP Location: Left Arm)   Pulse (!) 59   Temp (!) 97.3 F (36.3 C) (Oral)   Resp 20   SpO2 100%   Visual  Acuity Right Eye Distance:   Left Eye Distance:   Bilateral Distance:    Right Eye Near:   Left Eye Near:    Bilateral Near:     Physical Exam  Constitutional: She is oriented to person, place, and time. She appears well-developed and well-nourished. No distress.  Cardiovascular: Normal rate, regular rhythm and normal heart sounds.  Pulmonary/Chest: Effort normal and breath sounds normal.  Musculoskeletal:       Right knee: She exhibits effusion. She exhibits normal range of motion, no ecchymosis, no deformity, no laceration, no erythema, no bony tenderness and no MCL laxity. Tenderness found. Medial joint line and lateral joint line tenderness noted.  Right knee pain with flexion and tenderness to medial and lateral knee, generalized; without tendon laxity; effusion present; negative anterior drawer; sensation intact   Neurological: She is alert and oriented to person, place, and time.  Skin: Skin is warm and dry.     UC Treatments / Results  Labs (all labs ordered are listed, but only abnormal results are displayed) Labs Reviewed - No data to display  EKG  EKG Interpretation None       Radiology Dg Knee Ap/lat W/sunrise Right  Result Date: 01/15/2017 CLINICAL DATA:  RIGHT knee pain for 1 month, no known injury EXAM: RIGHT KNEE 3 VIEWS COMPARISON:  None FINDINGS: Osseous mineralization normal. Joint spaces preserved. Prominent patellar spur at quadriceps tendon insertion. Tiny patellofemoral spurs. No acute fracture, dislocation, or bone destruction. No knee joint effusion. Scattered atherosclerotic calcification of superficial femoral and popliteal arteries. IMPRESSION: Mild patellofemoral degenerative changes. No acute abnormalities. Electronically Signed   By: Lavonia Dana M.D.   On: 01/15/2017 13:22    Procedures Procedures (including critical care  time)  Medications Ordered in UC Medications - No data to display   Initial Impression / Assessment and Plan / UC Course  I have reviewed the triage vital signs and the nursing notes.  Pertinent labs & imaging results that were available during my care of the patient were reviewed by me and considered in my medical decision making (see chart for details).     Without acute injury, degenerative changes to right knee. Knee sleeve, ice, elevate. Topical diclofenac prescribed today as patient unable to take nsaids. Tylenol as needed for pain. Continue to follow with PCP and/or orthopedics as needed. Patient verbalized understanding and agreeable to plan.  Ambulatory out of clinic without difficulty.    Final Clinical Impressions(s) / UC Diagnoses   Final diagnoses:  Right knee pain, unspecified chronicity    ED Discharge Orders        Ordered    diclofenac sodium (VOLTAREN) 1 % GEL  4 times daily     01/15/17 1328    acetaminophen (TYLENOL) 500 MG tablet  Every 6 hours PRN     01/15/17 1328       Controlled Substance Prescriptions [place] Controlled Substance Registry consulted? Not Applicable   Zigmund Gottron, NP 01/15/17 1330

## 2017-03-28 DIAGNOSIS — H402232 Chronic angle-closure glaucoma, bilateral, moderate stage: Secondary | ICD-10-CM | POA: Diagnosis not present

## 2017-03-28 DIAGNOSIS — H43813 Vitreous degeneration, bilateral: Secondary | ICD-10-CM | POA: Diagnosis not present

## 2017-04-03 DIAGNOSIS — M1611 Unilateral primary osteoarthritis, right hip: Secondary | ICD-10-CM | POA: Diagnosis not present

## 2017-04-03 DIAGNOSIS — M170 Bilateral primary osteoarthritis of knee: Secondary | ICD-10-CM | POA: Diagnosis not present

## 2017-04-10 ENCOUNTER — Encounter: Payer: Self-pay | Admitting: Family Medicine

## 2017-04-10 DIAGNOSIS — N183 Chronic kidney disease, stage 3 (moderate): Secondary | ICD-10-CM | POA: Diagnosis not present

## 2017-04-10 DIAGNOSIS — R7309 Other abnormal glucose: Secondary | ICD-10-CM | POA: Diagnosis not present

## 2017-04-10 DIAGNOSIS — I129 Hypertensive chronic kidney disease with stage 1 through stage 4 chronic kidney disease, or unspecified chronic kidney disease: Secondary | ICD-10-CM | POA: Diagnosis not present

## 2017-04-10 DIAGNOSIS — M25552 Pain in left hip: Secondary | ICD-10-CM | POA: Diagnosis not present

## 2017-04-16 DIAGNOSIS — M545 Low back pain: Secondary | ICD-10-CM | POA: Diagnosis not present

## 2017-04-16 DIAGNOSIS — M5416 Radiculopathy, lumbar region: Secondary | ICD-10-CM | POA: Diagnosis not present

## 2017-05-21 DIAGNOSIS — M5416 Radiculopathy, lumbar region: Secondary | ICD-10-CM | POA: Diagnosis not present

## 2017-05-21 DIAGNOSIS — M545 Low back pain: Secondary | ICD-10-CM | POA: Diagnosis not present

## 2017-05-30 ENCOUNTER — Ambulatory Visit (HOSPITAL_COMMUNITY)
Admission: RE | Admit: 2017-05-30 | Discharge: 2017-05-30 | Disposition: A | Discharge status: Home / Self Care | Payer: Medicare Other | Source: Ambulatory Visit | Attending: Hematology and Oncology | Admitting: Hematology and Oncology

## 2017-05-30 ENCOUNTER — Inpatient Hospital Stay: Payer: Medicare Other | Attending: Hematology and Oncology

## 2017-05-30 DIAGNOSIS — N184 Chronic kidney disease, stage 4 (severe): Secondary | ICD-10-CM | POA: Insufficient documentation

## 2017-05-30 DIAGNOSIS — D631 Anemia in chronic kidney disease: Secondary | ICD-10-CM | POA: Diagnosis not present

## 2017-05-30 DIAGNOSIS — Z85118 Personal history of other malignant neoplasm of bronchus and lung: Secondary | ICD-10-CM

## 2017-05-30 DIAGNOSIS — J439 Emphysema, unspecified: Secondary | ICD-10-CM | POA: Diagnosis not present

## 2017-05-30 DIAGNOSIS — R918 Other nonspecific abnormal finding of lung field: Secondary | ICD-10-CM | POA: Insufficient documentation

## 2017-05-30 DIAGNOSIS — I700 Atherosclerosis of aorta: Secondary | ICD-10-CM | POA: Insufficient documentation

## 2017-05-30 DIAGNOSIS — I129 Hypertensive chronic kidney disease with stage 1 through stage 4 chronic kidney disease, or unspecified chronic kidney disease: Secondary | ICD-10-CM | POA: Diagnosis not present

## 2017-05-30 LAB — COMPREHENSIVE METABOLIC PANEL
ALK PHOS: 86 U/L (ref 40–150)
ALT: 19 U/L (ref 0–55)
ANION GAP: 6 (ref 3–11)
AST: 20 U/L (ref 5–34)
Albumin: 3.6 g/dL (ref 3.5–5.0)
BUN: 22 mg/dL (ref 7–26)
CALCIUM: 9.1 mg/dL (ref 8.4–10.4)
CO2: 25 mmol/L (ref 22–29)
Chloride: 110 mmol/L — ABNORMAL HIGH (ref 98–109)
Creatinine, Ser: 1.71 mg/dL — ABNORMAL HIGH (ref 0.60–1.10)
GFR, EST AFRICAN AMERICAN: 31 mL/min — AB (ref >60)
GFR, EST NON AFRICAN AMERICAN: 27 mL/min — AB (ref >60)
Glucose, Bld: 103 mg/dL (ref 70–140)
POTASSIUM: 4.2 mmol/L (ref 3.5–5.1)
Sodium: 141 mmol/L (ref 136–145)
TOTAL PROTEIN: 6.6 g/dL (ref 6.4–8.3)

## 2017-05-30 LAB — CBC WITH DIFFERENTIAL/PLATELET
Basophils Absolute: 0.1 10*3/uL (ref 0.0–0.1)
Basophils Relative: 1 %
EOS ABS: 0.6 10*3/uL — AB (ref 0.0–0.5)
EOS PCT: 9 %
HEMATOCRIT: 32.8 % — AB (ref 34.8–46.6)
Hemoglobin: 11 g/dL — ABNORMAL LOW (ref 11.6–15.9)
LYMPHS ABS: 1.2 10*3/uL (ref 0.9–3.3)
LYMPHS PCT: 15 %
MCH: 31.6 pg (ref 25.1–34.0)
MCHC: 33.5 g/dL (ref 31.5–36.0)
MCV: 94.2 fL (ref 79.5–101.0)
MONO ABS: 0.7 10*3/uL (ref 0.1–0.9)
MONOS PCT: 9 %
Neutro Abs: 4.9 10*3/uL (ref 1.5–6.5)
Neutrophils Relative %: 66 %
Platelets: 297 10*3/uL (ref 145–400)
RBC: 3.48 MIL/uL — ABNORMAL LOW (ref 3.70–5.45)
RDW: 14 % (ref 11.2–14.5)
WBC: 7.5 10*3/uL (ref 3.9–10.3)

## 2017-05-31 ENCOUNTER — Telehealth: Payer: Self-pay | Admitting: Hematology and Oncology

## 2017-05-31 ENCOUNTER — Inpatient Hospital Stay (HOSPITAL_BASED_OUTPATIENT_CLINIC_OR_DEPARTMENT_OTHER): Payer: Medicare Other | Admitting: Hematology and Oncology

## 2017-05-31 ENCOUNTER — Encounter: Payer: Self-pay | Admitting: Hematology and Oncology

## 2017-05-31 DIAGNOSIS — D631 Anemia in chronic kidney disease: Secondary | ICD-10-CM

## 2017-05-31 DIAGNOSIS — R911 Solitary pulmonary nodule: Secondary | ICD-10-CM

## 2017-05-31 DIAGNOSIS — N184 Chronic kidney disease, stage 4 (severe): Secondary | ICD-10-CM

## 2017-05-31 DIAGNOSIS — I129 Hypertensive chronic kidney disease with stage 1 through stage 4 chronic kidney disease, or unspecified chronic kidney disease: Secondary | ICD-10-CM

## 2017-05-31 DIAGNOSIS — Z85118 Personal history of other malignant neoplasm of bronchus and lung: Secondary | ICD-10-CM | POA: Diagnosis not present

## 2017-05-31 DIAGNOSIS — R918 Other nonspecific abnormal finding of lung field: Secondary | ICD-10-CM | POA: Diagnosis not present

## 2017-05-31 NOTE — Telephone Encounter (Signed)
Gave avs and calendar ° °
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
## 2017-05-31 NOTE — Assessment & Plan Note (Signed)
She has stable chronic kidney disease stage IV She will continue risk factor modification

## 2017-05-31 NOTE — Assessment & Plan Note (Signed)
This is likely anemia of chronic disease. The patient denies recent history of bleeding such as epistaxis, hematuria or hematochezia. She is asymptomatic from the anemia. We will observe for now.  

## 2017-05-31 NOTE — Assessment & Plan Note (Signed)
I have reviewed multiple imaging study dated back to 2017, 2018 and the most recent one with the patient and her daughter The small increment in size of the right upper lung nodule, in my opinion, is quite similar to previous exam She is not symptomatic She is not smoking I recommend seeing her back next year again with repeat imaging study

## 2017-06-12 DIAGNOSIS — M545 Low back pain: Secondary | ICD-10-CM | POA: Diagnosis not present

## 2017-06-12 DIAGNOSIS — M5416 Radiculopathy, lumbar region: Secondary | ICD-10-CM | POA: Diagnosis not present

## 2017-06-29 ENCOUNTER — Encounter: Payer: Self-pay | Admitting: Family Medicine

## 2017-06-29 ENCOUNTER — Ambulatory Visit (INDEPENDENT_AMBULATORY_CARE_PROVIDER_SITE_OTHER): Payer: Medicare Other | Admitting: Family Medicine

## 2017-06-29 ENCOUNTER — Other Ambulatory Visit: Payer: Self-pay

## 2017-06-29 VITALS — BP 140/80 | HR 63 | Temp 98.7°F | Ht 61.0 in | Wt 169.0 lb

## 2017-06-29 DIAGNOSIS — Z7689 Persons encountering health services in other specified circumstances: Secondary | ICD-10-CM | POA: Diagnosis not present

## 2017-06-29 DIAGNOSIS — D631 Anemia in chronic kidney disease: Secondary | ICD-10-CM

## 2017-06-29 DIAGNOSIS — I10 Essential (primary) hypertension: Secondary | ICD-10-CM | POA: Diagnosis not present

## 2017-06-29 DIAGNOSIS — I129 Hypertensive chronic kidney disease with stage 1 through stage 4 chronic kidney disease, or unspecified chronic kidney disease: Secondary | ICD-10-CM

## 2017-06-29 DIAGNOSIS — Z85118 Personal history of other malignant neoplasm of bronchus and lung: Secondary | ICD-10-CM | POA: Diagnosis not present

## 2017-06-29 DIAGNOSIS — N184 Chronic kidney disease, stage 4 (severe): Secondary | ICD-10-CM | POA: Diagnosis not present

## 2017-06-29 NOTE — Patient Instructions (Addendum)
IF you received an x-ray today, you will receive an invoice from [facility]. Please contact [facility] at [phone] with questions or concerns regarding your invoice.   IF you received labwork today, you will receive an invoice from . Please contact [facility] at [phone] with questions or concerns regarding your invoice.   Our billing staff will not be able to assist you with questions regarding bills from these companies.  You will be contacted with the lab results as soon as they are available. The fastest way to get your results is to activate your My Chart account. Instructions are located on the last page of this paperwork. If you have not heard from [place] regarding the results in 2 weeks, please contact this office.     Hypertension Hypertension, commonly called high blood pressure, is when the force of blood pumping through the arteries is too strong. The arteries are the blood vessels that carry blood from the heart throughout the body. Hypertension forces the heart to work harder to pump blood and may cause arteries to become narrow or stiff. Having untreated or uncontrolled hypertension can cause heart attacks, strokes, kidney disease, and other problems. A blood pressure reading consists of a higher number over a lower number. Ideally, your blood pressure should be below 120/80. The first ("top") number is called the systolic pressure. It is a measure of the pressure in your arteries as your heart beats. The second ("bottom") number is called the diastolic pressure. It is a measure of the pressure in your arteries as the heart relaxes. What are the causes? The cause of this condition is not known. What increases the risk? Some risk factors for high blood pressure are under your control. Others are not. Factors you can change  Smoking.  Having type 2 diabetes mellitus, high cholesterol, or both.  Not getting enough exercise or physical  activity.  Being overweight.  Having too much fat, sugar, calories, or salt (sodium) in your diet.  Drinking too much alcohol. Factors that are difficult or impossible to change  Having chronic kidney disease.  Having a family history of high blood pressure.  Age. Risk increases with age.  Race. You may be at higher risk if you are African-American.  Gender. Men are at higher risk than women before age 71. After age 9, women are at higher risk than men.  Having obstructive sleep apnea.  Stress. What are the signs or symptoms? Extremely high blood pressure (hypertensive crisis) may cause:  Headache.  Anxiety.  Shortness of breath.  Nosebleed.  Nausea and vomiting.  Severe chest pain.  Jerky movements you cannot control (seizures).  How is this diagnosed? This condition is diagnosed by measuring your blood pressure while you are seated, with your arm resting on a surface. The cuff of the blood pressure monitor will be placed directly against the skin of your upper arm at the level of your heart. It should be measured at least twice using the same arm. Certain conditions can cause a difference in blood pressure between your right and left arms. Certain factors can cause blood pressure readings to be lower or higher than normal (elevated) for a short period of time:  When your blood pressure is higher when you are in a health care provider's office than when you are at home, this is called white coat hypertension. Most people with this condition do not need medicines.  When your blood pressure is higher at home than when you  are in a health care provider's office, this is called masked hypertension. Most people with this condition may need medicines to control blood pressure.  If you have a high blood pressure reading during one visit or you have normal blood pressure with other risk factors:  You may be asked to return on a different day to have your blood pressure  checked again.  You may be asked to monitor your blood pressure at home for 1 week or longer.  If you are diagnosed with hypertension, you may have other blood or imaging tests to help your health care provider understand your overall risk for other conditions. How is this treated? This condition is treated by making healthy lifestyle changes, such as eating healthy foods, exercising more, and reducing your alcohol intake. Your health care provider may prescribe medicine if lifestyle changes are not enough to get your blood pressure under control, and if:  Your systolic blood pressure is above 130.  Your diastolic blood pressure is above 80.  Your personal target blood pressure may vary depending on your medical conditions, your age, and other factors. Follow these instructions at home: Eating and drinking  Eat a diet that is high in fiber and potassium, and low in sodium, added sugar, and fat. An example eating plan is called the DASH (Dietary Approaches to Stop Hypertension) diet. To eat this way: ? Eat plenty of fresh fruits and vegetables. Try to fill half of your plate at each meal with fruits and vegetables. ? Eat whole grains, such as whole wheat pasta, brown rice, or whole grain bread. Fill about one quarter of your plate with whole grains. ? Eat or drink low-fat dairy products, such as skim milk or low-fat yogurt. ? Avoid fatty cuts of meat, processed or cured meats, and poultry with skin. Fill about one quarter of your plate with lean proteins, such as fish, chicken without skin, beans, eggs, and tofu. ? Avoid premade and processed foods. These tend to be higher in sodium, added sugar, and fat.  Reduce your daily sodium intake. Most people with hypertension should eat less than 1,500 mg of sodium a day.  Limit alcohol intake to no more than 1 drink a day for nonpregnant women and 2 drinks a day for men. One drink equals 12 oz of beer, 5 oz of wine, or 1 oz of hard  liquor. Lifestyle  Work with your health care provider to maintain a healthy body weight or to lose weight. Ask what an ideal weight is for you.  Get at least 30 minutes of exercise that causes your heart to beat faster (aerobic exercise) most days of the week. Activities may include walking, swimming, or biking.  Include exercise to strengthen your muscles (resistance exercise), such as pilates or lifting weights, as part of your weekly exercise routine. Try to do these types of exercises for 30 minutes at least 3 days a week.  Do not use any products that contain nicotine or tobacco, such as cigarettes and e-cigarettes. If you need help quitting, ask your health care provider.  Monitor your blood pressure at home as told by your health care provider.  Keep all follow-up visits as told by your health care provider. This is important. Medicines  Take over-the-counter and prescription medicines only as told by your health care provider. Follow directions carefully. Blood pressure medicines must be taken as prescribed.  Do not skip doses of blood pressure medicine. Doing this puts you at risk for problems and  can make the medicine less effective.  Ask your health care provider about side effects or reactions to medicines that you should watch for. Contact a health care provider if:  You think you are having a reaction to a medicine you are taking.  You have headaches that keep coming back (recurring).  You feel dizzy.  You have swelling in your ankles.  You have trouble with your vision. Get help right away if:  You develop a severe headache or confusion.  You have unusual weakness or numbness.  You feel faint.  You have severe pain in your chest or abdomen.  You vomit repeatedly.  You have trouble breathing. Summary  Hypertension is when the force of blood pumping through your arteries is too strong. If this condition is not controlled, it may put you at risk for serious  complications.  Your personal target blood pressure may vary depending on your medical conditions, your age, and other factors. For most people, a normal blood pressure is less than 120/80.  Hypertension is treated with lifestyle changes, medicines, or a combination of both. Lifestyle changes include weight loss, eating a healthy, low-sodium diet, exercising more, and limiting alcohol. This information is not intended to replace advice given to you by your health care provider. Make sure you discuss any questions you have with your health care provider. Document Released: 01/09/2005 Document Revised: 12/08/2015 Document Reviewed: 12/08/2015 Elsevier Interactive Patient Education  [employer].

## 2017-06-29 NOTE — Progress Notes (Signed)
Chief Complaint  Patient presents with  . Establish Care    HPI  She is a former patient of [name] [facility] [address] She needs a PCP and was referred from [facility]  She has enough of her medications and does not need any refills She has a history of Chronic Kidney Disease stage 3 She is compliant with her medications  She has a history of arthritis She is seen Orthopedics And has chronic knee and back pain    Past Medical History:  Diagnosis Date  . Arthritis   . Benign hypertensive kidney disease with chronic kidney disease stage I through stage IV, or unspecified(403.10)    sees dr. Justin Bishop again in april  . Cancer ([place])    lung ca dx'd 02/2013  . Chronic kidney disease, stage III (moderate) (HCC)   . Cough 02/26/2013  . GERD (gastroesophageal reflux disease)   . Headache   . Hypertension   . Hyperthyroidism    took iodine treatment for this  . Lung mass 02/27/2013  . Neurologic disorder    2009  . Shortness of breath    exertion  . Thrombocytopenia, unspecified ([place]) 02/26/2013  . Unspecified vitamin D deficiency     Current Outpatient Medications  Medication Sig Dispense Refill  . acetaminophen (TYLENOL) 500 MG tablet Take 1 tablet (500 mg total) by mouth every 6 (six) hours as needed. 30 tablet 0  . atorvastatin (LIPITOR) 10 MG tablet Take 10 mg by mouth daily at 6 PM.     . chlorthalidone (HYGROTON) 25 MG tablet Take 1 tablet by mouth daily.  4  . Cholecalciferol (VITAMIN D-3) 1000 UNITS CAPS Take 1,000 Units by mouth daily.     . clopidogrel (PLAVIX) 75 MG tablet Take 75 mg by mouth daily with breakfast.     . cyclobenzaprine (FLEXERIL) 10 MG tablet Take 1 tablet (10 mg total) by mouth 2 (two) times daily as needed for muscle spasms. 10 tablet 0  . magnesium oxide (MAG-OX) 400 MG tablet Take 400 mg by mouth daily.    . metoprolol succinate (TOPROL-XL) 25 MG 24 hr tablet Take 25 mg by mouth daily.     . Multiple Vitamin (MULTIVITAMIN) tablet Take 1 tablet by  mouth at bedtime.     Marland Kitchen telmisartan (MICARDIS) 40 MG tablet     . TRAVATAN Z 0.004 % SOLN ophthalmic solution Apply 1 drop to eye daily. In affected eye  6  . diclofenac sodium (VOLTAREN) 1 % GEL Apply 2 g topically 4 (four) times daily. 1 Tube 0   No current facility-administered medications for this visit.     Allergies:  Allergies  Allergen Reactions  . Aspirin Other (See Comments)    REACTION: stomach upset  . Lactose Intolerance (Gi) Diarrhea and Other (See Comments)    "can't drink milk. It tears my stomach up."  . Adhesive [Tape] Rash  . Lisinopril Other (See Comments)    cough  . Percocet [Oxycodone-Acetaminophen] Itching    Past Surgical History:  Procedure Laterality Date  . BACK SURGERY    . EYE SURGERY     cataract  . EYE SURGERY Left    open up tear duct  . LYMPH NODE DISSECTION Left 04/07/2013   Procedure: LYMPH NODE DISSECTION;  Surgeon: Cheryl Isaac, MD;  Location: [place];  Service: Thoracic;  Laterality: Left;  . NECK SURGERY    . PORT-A-CATH REMOVAL Left 01/09/2014   Procedure: REMOVAL PORT-A-CATH;  Surgeon: Cheryl Isaac, MD;  Location: [facility] OR;  Service: Thoracic;  Laterality: Left;  . PORTACATH PLACEMENT Left 05/06/2013   Procedure: INSERTION PORT-A-CATH;  Surgeon: Cheryl Isaac, MD;  Location: [place];  Service: Thoracic;  Laterality: Left;  Marland Kitchen VIDEO ASSISTED THORACOSCOPY (VATS)/ LOBECTOMY Left 04/07/2013   Procedure: VIDEO ASSISTED THORACOSCOPY (VATS)/ LOBECTOMY; INSERTION OF ON-Q PUMP;  Surgeon: Cheryl Isaac, MD;  Location: [place];  Service: Thoracic;  Laterality: Left;  Marland Kitchen VIDEO BRONCHOSCOPY N/A 04/07/2013   Procedure: VIDEO BRONCHOSCOPY;  Surgeon: Cheryl Isaac, MD;  Location: [facility] OR;  Service: Thoracic;  Laterality: N/A;    Social History   Socioeconomic History  . Marital status: Single    Spouse name: Not on file  . Number of children: 2  . Years of education: Not on file  . Highest education level: Not on file  Occupational  History  . Not on file  Social Needs  . Financial resource strain: Not on file  . Food insecurity:    Worry: Not on file    Inability: Not on file  . Transportation needs:    Medical: Not on file    Non-medical: Not on file  Tobacco Use  . Smoking status: Former Smoker    Packs/day: 0.50    Years: 60.00    Pack years: 30.00    Types: Cigarettes    Last attempt to quit: 02/25/2012    Years since quitting: 5.3  . Smokeless tobacco: Never Used  Substance and Sexual Activity  . Alcohol use: No  . Drug use: No  . Sexual activity: Not on file  Lifestyle  . Physical activity:    Days per week: Not on file    Minutes per session: Not on file  . Stress: Not on file  Relationships  . Social connections:    Talks on phone: Not on file    Gets together: Not on file    Attends religious service: Not on file    Active member of club or organization: Not on file    Attends meetings of clubs or organizations: Not on file    Relationship status: Not on file  Other Topics Concern  . Not on file  Social History Narrative  . Not on file    Family History  Problem Relation Age of Onset  . Lung cancer Brother        smoked  . Breast cancer Mother   . Lung cancer Father        smoked  . Rectal cancer Neg Hx   . Stomach cancer Neg Hx   . Esophageal cancer Neg Hx   . Colon cancer Neg Hx      ROS Review of Systems See HPI Constitution: No fevers or chills No malaise No diaphoresis Skin: No rash or itching Eyes: no blurry vision, no double vision GU: no dysuria or hematuria Neuro: no dizziness or headaches all others reviewed and negative   Objective: Vitals:   06/29/17 1139  BP: 140/80  Pulse: 63  Temp: 98.7 F (37.1 C)  TempSrc: Oral  SpO2: 98%  Weight: 169 lb (76.7 kg)  Height: 5\' 1"  (1.549 m)   BP Readings from Last 3 Encounters:  06/29/17 140/80  05/31/17 (!) 146/66  01/15/17 (!) 155/71    Physical Exam Physical Exam  Constitutional: She is oriented to  person, place, and time. She appears well-developed and well-nourished.  HENT:  Head: Normocephalic and atraumatic.  Eyes: Conjunctivae and EOM are normal.  Cardiovascular: Normal rate,  regular rhythm and normal heart sounds.   Pulmonary/Chest: Effort normal and breath sounds normal. No respiratory distress. She has no wheezes.  Abdominal: Normal appearance and bowel sounds are normal. There is no tenderness. There is no CVA tenderness.  Neurological: She is alert and oriented to person, place, and time.   Assessment and Plan [name] was seen today for establish care.  Diagnoses and all orders for this visit:  Establishing care with new doctor, encounter for  Anemia in stage 4 chronic kidney disease ([place]) - established with nephrology  History of lung cancer  Essential hypertension, benign- bp in good range  Benign hypertension with CKD (chronic kidney disease) stage IV ([place])- will get labs next visit and plan to review records     [place]

## 2017-07-31 DIAGNOSIS — M545 Low back pain: Secondary | ICD-10-CM | POA: Diagnosis not present

## 2017-07-31 DIAGNOSIS — M5416 Radiculopathy, lumbar region: Secondary | ICD-10-CM | POA: Diagnosis not present

## 2017-08-02 ENCOUNTER — Other Ambulatory Visit: Payer: Self-pay

## 2017-08-02 ENCOUNTER — Ambulatory Visit (INDEPENDENT_AMBULATORY_CARE_PROVIDER_SITE_OTHER): Payer: Medicare Other | Admitting: Family Medicine

## 2017-08-02 ENCOUNTER — Encounter: Payer: Self-pay | Admitting: Family Medicine

## 2017-08-02 VITALS — BP 146/75 | HR 83 | Temp 98.8°F | Resp 17 | Ht 61.0 in | Wt 170.0 lb

## 2017-08-02 DIAGNOSIS — Z7901 Long term (current) use of anticoagulants: Secondary | ICD-10-CM | POA: Insufficient documentation

## 2017-08-02 DIAGNOSIS — I10 Essential (primary) hypertension: Secondary | ICD-10-CM

## 2017-08-02 DIAGNOSIS — D631 Anemia in chronic kidney disease: Secondary | ICD-10-CM

## 2017-08-02 DIAGNOSIS — M533 Sacrococcygeal disorders, not elsewhere classified: Secondary | ICD-10-CM | POA: Diagnosis not present

## 2017-08-02 DIAGNOSIS — Z8673 Personal history of transient ischemic attack (TIA), and cerebral infarction without residual deficits: Secondary | ICD-10-CM | POA: Diagnosis not present

## 2017-08-02 DIAGNOSIS — N184 Chronic kidney disease, stage 4 (severe): Secondary | ICD-10-CM | POA: Diagnosis not present

## 2017-08-02 DIAGNOSIS — E559 Vitamin D deficiency, unspecified: Secondary | ICD-10-CM | POA: Insufficient documentation

## 2017-08-02 DIAGNOSIS — I129 Hypertensive chronic kidney disease with stage 1 through stage 4 chronic kidney disease, or unspecified chronic kidney disease: Secondary | ICD-10-CM

## 2017-08-02 DIAGNOSIS — M1990 Unspecified osteoarthritis, unspecified site: Secondary | ICD-10-CM

## 2017-08-02 MED ORDER — METOPROLOL SUCCINATE ER 25 MG PO TB24: 25.0000 mg | ORAL_TABLET | Freq: Every day | ORAL | 1 refills | Status: DC

## 2017-08-02 MED ORDER — ATORVASTATIN CALCIUM 10 MG PO TABS: 10.0000 mg | ORAL_TABLET | Freq: Every day | ORAL | 1 refills | Status: DC

## 2017-08-02 MED ORDER — CHLORTHALIDONE 25 MG PO TABS: 25.0000 mg | ORAL_TABLET | Freq: Every day | ORAL | 1 refills | Status: DC

## 2017-08-02 MED ORDER — DICLOFENAC SODIUM 1 % TD GEL: 2.0000 g | Freq: Four times a day (QID) | TRANSDERMAL | 11 refills | Status: DC

## 2017-08-02 MED ORDER — CYCLOBENZAPRINE HCL 10 MG PO TABS: 10.0000 mg | ORAL_TABLET | Freq: Two times a day (BID) | ORAL | 1 refills | Status: DC | PRN

## 2017-08-02 MED ORDER — TRAVATAN Z 0.004 % OP SOLN: 1.0000 [drp] | Freq: Every day | OPHTHALMIC | 6 refills | Status: DC

## 2017-08-02 MED ORDER — CLOPIDOGREL BISULFATE 75 MG PO TABS: 75.0000 mg | ORAL_TABLET | Freq: Every day | ORAL | 3 refills | Status: DC

## 2017-08-02 MED ORDER — VITAMIN D-3 25 MCG (1000 UT) PO CAPS: 1000.0000 [IU] | ORAL_CAPSULE | Freq: Every day | ORAL | 1 refills | Status: DC

## 2017-08-02 MED ORDER — MAGNESIUM OXIDE 400 MG PO TABS: 400.0000 mg | ORAL_TABLET | Freq: Every day | ORAL | 1 refills | Status: DC

## 2017-08-02 MED ORDER — TELMISARTAN 40 MG PO TABS: 40.0000 mg | ORAL_TABLET | Freq: Every day | ORAL | 1 refills | Status: DC

## 2017-08-02 NOTE — Progress Notes (Signed)
Chief Complaint  Patient presents with  . review of records    f/u in one month.  Pt seeing dr for her pinched nerve in back, pt will be getting a injection to help but  dr needs ok from [place] that it is ok for pt to stop plavix for 5 days.  They can't pronounce  dr's name and pt seen on tuesday by dr for back  . Medication Refill    lipitor, hygroton, viatmin d-3, plavix,. flexeril, voltaren 1% gel, mag-ox, toprol-xl, micardis, travatan z    HPI  Anticoagulation discussion She was started on Plavix after a TIA with unknown cause She will be getting left SI TF Epidural Injection  She reports that she would like to be off the Plavix prior to the surgery  She was previously taken off her plavix without incidence for injection in her back  She needs medication refills She takes statin, bp meds and arthritis meds She drinks plenty of water    Past Medical History:  Diagnosis Date  . Arthritis   . Benign hypertensive kidney disease with chronic kidney disease stage I through stage IV, or unspecified(403.10)    sees dr. Justin Mend again in april  . Cancer ([place])    lung ca dx'd 02/2013  . Chronic kidney disease, stage III (moderate) (HCC)   . Cough 02/26/2013  . GERD (gastroesophageal reflux disease)   . Headache   . Hypertension   . Hyperthyroidism    took iodine treatment for this  . Lung mass 02/27/2013  . Neurologic disorder    2009  . Shortness of breath    exertion  . Thrombocytopenia, unspecified ([place]) 02/26/2013  . Unspecified vitamin D deficiency     Current Outpatient Medications  Medication Sig Dispense Refill  . acetaminophen (TYLENOL) 500 MG tablet Take 1 tablet (500 mg total) by mouth every 6 (six) hours as needed. 30 tablet 0  . atorvastatin (LIPITOR) 10 MG tablet Take 1 tablet (10 mg total) by mouth daily at 6 PM. 90 tablet 1  . chlorthalidone (HYGROTON) 25 MG tablet Take 1 tablet (25 mg total) by mouth daily. 90 tablet 1  . Cholecalciferol (VITAMIN D-3) 1000 units CAPS  Take 1 capsule (1,000 Units total) by mouth daily. 90 capsule 1  . clopidogrel (PLAVIX) 75 MG tablet Take 1 tablet (75 mg total) by mouth daily with breakfast. 90 tablet 3  . cyclobenzaprine (FLEXERIL) 10 MG tablet Take 1 tablet (10 mg total) by mouth 2 (two) times daily as needed for muscle spasms. 90 tablet 1  . diclofenac sodium (VOLTAREN) 1 % GEL Apply 2 g topically 4 (four) times daily. 1 Tube 11  . magnesium oxide (MAG-OX) 400 MG tablet Take 1 tablet (400 mg total) by mouth daily. 90 tablet 1  . metoprolol succinate (TOPROL-XL) 25 MG 24 hr tablet Take 1 tablet (25 mg total) by mouth daily. 90 tablet 1  . Multiple Vitamin (MULTIVITAMIN) tablet Take 1 tablet by mouth at bedtime.     Marland Kitchen telmisartan (MICARDIS) 40 MG tablet Take 1 tablet (40 mg total) by mouth daily. 90 tablet 1  . TRAVATAN Z 0.004 % SOLN ophthalmic solution Place 1 drop into both eyes daily. In affected eye 5 mL 6   No current facility-administered medications for this visit.     Allergies:  Allergies  Allergen Reactions  . Aspirin Other (See Comments)    REACTION: stomach upset  . Lactose Intolerance (Gi) Diarrhea and Other (See Comments)    "  can't drink milk. It tears my stomach up."  . Adhesive [Tape] Rash  . Lisinopril Other (See Comments)    cough  . Percocet [Oxycodone-Acetaminophen] Itching    Past Surgical History:  Procedure Laterality Date  . BACK SURGERY    . EYE SURGERY     cataract  . EYE SURGERY Left    open up tear duct  . LYMPH NODE DISSECTION Left 04/07/2013   Procedure: LYMPH NODE DISSECTION;  Surgeon: [name], MD;  Location: [place];  Service: Thoracic;  Laterality: Left;  . NECK SURGERY    . PORT-A-CATH REMOVAL Left 01/09/2014   Procedure: REMOVAL PORT-A-CATH;  Surgeon: [name], MD;  Location: [place];  Service: Thoracic;  Laterality: Left;  . PORTACATH PLACEMENT Left 05/06/2013   Procedure: INSERTION PORT-A-CATH;  Surgeon: [name], MD;  Location: [place];  Service:  Thoracic;  Laterality: Left;  Marland Kitchen VIDEO ASSISTED THORACOSCOPY (VATS)/ LOBECTOMY Left 04/07/2013   Procedure: VIDEO ASSISTED THORACOSCOPY (VATS)/ LOBECTOMY; INSERTION OF ON-Q PUMP;  Surgeon: [name], MD;  Location: [place];  Service: Thoracic;  Laterality: Left;  Marland Kitchen VIDEO BRONCHOSCOPY N/A 04/07/2013   Procedure: VIDEO BRONCHOSCOPY;  Surgeon: [name], MD;  Location: [facility] OR;  Service: Thoracic;  Laterality: N/A;    Social History   Socioeconomic History  . Marital status: Single    Spouse name: Not on file  . Number of children: 2  . Years of education: Not on file  . Highest education level: Not on file  Occupational History  . Not on file  Social Needs  . Financial resource strain: Not on file  . Food insecurity:    Worry: Not on file    Inability: Not on file  . Transportation needs:    Medical: Not on file    Non-medical: Not on file  Tobacco Use  . Smoking status: Former Smoker    Packs/day: 0.50    Years: 60.00    Pack years: 30.00    Types: Cigarettes    Last attempt to quit: 02/25/2012    Years since quitting: 5.4  . Smokeless tobacco: Never Used  Substance and Sexual Activity  . Alcohol use: No  . Drug use: No  . Sexual activity: Not on file  Lifestyle  . Physical activity:    Days per week: Not on file    Minutes per session: Not on file  . Stress: Not on file  Relationships  . Social connections:    Talks on phone: Not on file    Gets together: Not on file    Attends religious service: Not on file    Active member of club or organization: Not on file    Attends meetings of clubs or organizations: Not on file    Relationship status: Not on file  Other Topics Concern  . Not on file  Social History Narrative  . Not on file    Family History  Problem Relation Age of Onset  . Lung cancer Brother        smoked  . Breast cancer Mother   . Lung cancer Father        smoked  . Rectal cancer Neg Hx   . Stomach cancer Neg Hx   . Esophageal  cancer Neg Hx   . Colon cancer Neg Hx      ROS Review of Systems See HPI Constitution: No fevers or chills No malaise No diaphoresis Skin: No rash or itching Eyes:  no blurry vision, no double vision GU: no dysuria or hematuria Neuro: no dizziness or headaches * all others reviewed and negative   Objective: Vitals:   08/02/17 1059  BP: (!) 146/75  Pulse: 83  Resp: 17  Temp: 98.8 F (37.1 C)  TempSrc: Oral  SpO2: 97%  Weight: 170 lb (77.1 kg)  Height: 5\' 1"  (1.549 m)    Physical Exam  Constitutional: She is oriented to person, place, and time. She appears well-developed and well-nourished.  HENT:  Head: Normocephalic and atraumatic.  Eyes: Conjunctivae and EOM are normal.  Neck: Normal range of motion. Neck supple.  Cardiovascular: Normal rate, regular rhythm and normal heart sounds.  No murmur heard. Pulmonary/Chest: Effort normal and breath sounds normal. No stridor. No respiratory distress.  Neurological: She is alert and oriented to person, place, and time.  Skin: Skin is warm. Capillary refill takes less than 2 seconds.  Psychiatric: She has a normal mood and affect. Her behavior is normal. Judgment and thought content normal.    Assessment and Plan [place] was seen today for review of records and medication refill.  Diagnoses and all orders for this visit:  Anemia in stage 4 chronic kidney disease ([place])  Essential hypertension, benign  Sacroiliac joint disease  History of TIA (transient ischemic attack)  Vitamin D deficiency  Arthritis  Long term current use of anticoagulant therapy  Other orders -     telmisartan (MICARDIS) 40 MG tablet; Take 1 tablet (40 mg total) by mouth daily. -     magnesium oxide (MAG-OX) 400 MG tablet; Take 1 tablet (400 mg total) by mouth daily. -     diclofenac sodium (VOLTAREN) 1 % GEL; Apply 2 g topically 4 (four) times daily. -     metoprolol succinate (TOPROL-XL) 25 MG 24 hr tablet; Take 1 tablet (25 mg total) by  mouth daily. -     cyclobenzaprine (FLEXERIL) 10 MG tablet; Take 1 tablet (10 mg total) by mouth 2 (two) times daily as needed for muscle spasms. -     atorvastatin (LIPITOR) 10 MG tablet; Take 1 tablet (10 mg total) by mouth daily at 6 PM. -     Cholecalciferol (VITAMIN D-3) 1000 units CAPS; Take 1 capsule (1,000 Units total) by mouth daily. -     clopidogrel (PLAVIX) 75 MG tablet; Take 1 tablet (75 mg total) by mouth daily with breakfast. -     TRAVATAN Z 0.004 % SOLN ophthalmic solution; Place 1 drop into both eyes daily. In affected eye -     chlorthalidone (HYGROTON) 25 MG tablet; Take 1 tablet (25 mg total) by mouth daily.   Refilled meds Reviewed labs Discussed anticoagulation Will plan for next visit to be medicare wellness exam Did not individually address each issue but updated the problem list Completed clearance form for Neurosurgery   [place]

## 2017-08-02 NOTE — Patient Instructions (Addendum)
Stop Plavix for 5 days before your procedure. Restart 1 day after the procedure if all goes well.    Bleeding Precautions When on Anticoagulant Therapy WHAT IS ANTICOAGULANT THERAPY? Anticoagulant therapy is taking medicine to prevent or reduce blood clots. It is also called blood thinner therapy. Blood clots that form in your blood vessels can be dangerous. They can break loose and travel to your heart, lungs, or brain. This increases your risk of a heart attack or stroke. Anticoagulant therapy causes blood to clot more slowly. You may need anticoagulant therapy if you have:  A medical condition that increases the likelihood that blood clots will form.  A heart defect or a problem with heart rhythm. It is also a common treatment after heart surgery, such as valve replacement. WHAT ARE COMMON TYPES OF ANTICOAGULANT THERAPY? Anticoagulant medicine can be injected or taken by mouth.If you need anticoagulant therapy quickly at the hospital, the medicine may be injected under your skin or given through an IV tube. Heparin is a common example of an anticoagulant that you may get at the hospital. Most anticoagulant therapy is in the form of pills that you take at home every day. These may include:  Aspirin. This common blood thinner works by preventing blood cells (platelets) from sticking together to form a clot. Aspirin is not as strong as anticoagulants that slow down the time that it takes for your body to form a clot.  Clopidogrel. This is a newer type of drug that affects platelets. It is stronger than aspirin.  Warfarin. This is the most common anticoagulant. It changes the way your body uses vitamin K, a vitamin that helps your blood to clot. The risk of bleeding is higher with warfarin than with aspirin. You will need frequent blood tests to make sure you are taking the safest amount.  New anticoagulants. Several new drugs have been approved. They are all taken by mouth. Studies show that  these drugs work as well as warfarin. They do not require blood testing. They may cause less bleeding risk than warfarin. WHAT DO I NEED TO REMEMBER WHEN TAKING ANTICOAGULANT THERAPY? Anticoagulant therapy decreases your risk of forming a blood clot, but it increases your risk of bleeding. Work closely with your health care provider to make sure you are taking your medicine safely. These tips can help:  Learn ways to reduce your risk of bleeding.  If you are taking warfarin: ? Have blood tests as ordered by your health care provider. ? Do not make any sudden changes to your diet. Vitamin K in your diet can make warfarin less effective. ? Do not get pregnant. This medicine may cause birth defects.  Take your medicine at the same time every day. If you forget to take your medicine, take it as soon as you remember. If you miss a whole day, do not double your dose of medicine. Take your normal dose and call your health care provider to check in.  Do not stop taking your medicine on your own.  Tell your health care provider before you start taking any new medicine, vitamin, or herbal product. Some of these could interfere with your therapy.  Tell all of your health care providers that you are on anticoagulant therapy.  Do not have surgery, medical procedures, or dental work until you tell your health care provider that you are on anticoagulant therapy. WHAT CAN AFFECT HOW ANTICOAGULANTS WORK? Certain foods, vitamins, medicines, supplements, and herbal medicines change the way that anticoagulant  therapy works. They may increase or decrease the effects of your anticoagulant therapy. Either result can be dangerous for you.  Many over-the-counter medicines for pain, colds, or stomach problems interfere with anticoagulant therapy. Take these only as told by your health care provider.  Do not drink alcohol. It can interfere with your medicine and increase your risk of an injury that causes  bleeding.  If you are taking warfarin, do not begin eating more foods that contain vitamin K. These include leafy green vegetables. Ask your health care provider if you should avoid any foods. WHAT ARE SOME WAYS TO PREVENT BLEEDING? You can prevent bleeding by taking certain precautions:  Be extra careful when you use knives, scissors, or other sharp objects.  Use an electric razor instead of a blade.  Do not use toothpicks.  Use a soft toothbrush.  Wear shoes that have nonskid soles.  Use bath mats and handrails in your bathroom.  Wear gloves while you do yard work.  Wear a helmet when you ride a bike.  Wear your seat belt.  Prevent falls by removing loose rugs and extension cords from areas where you walk.  Do not play contact sports or participate in other activities that have a high risk of injury. [place] PROVIDER? Call your health care provider if:  You miss a dose of medicine: ? And you are not sure what to do. ? For more than one day.  You have: ? Menstrual bleeding that is heavier than normal. ? Blood in your urine. ? A bloody nose or bleeding gums. ? Easy bruising. ? Blood in your stool (feces) or have black and tarry stool. ? Side effects from your medicine.  You feel weak or dizzy.  You become pregnant. Seek immediate medical care if:  You have bleeding that will not stop.  You have sudden and severe headache or belly pain.  You vomit or you cough up bright red blood.  You have a severe blow to your head. WHAT ARE SOME QUESTIONS TO ASK MY HEALTH CARE PROVIDER?  What is the best anticoagulant therapy for my condition?  What side effects should I watch for?  When should I take my medicine? What should I do if I forget to take it?  Will I need to have regular blood tests?  Do I need to change my diet? Are there foods or drinks that I should avoid?  What activities are safe for me?  What should I do if I want to  get pregnant? This information is not intended to replace advice given to you by your health care provider. Make sure you discuss any questions you have with your health care provider. Document Released: 12/21/2014 Document Reviewed: 12/21/2014 Elsevier Interactive Patient Education  2017 [employer].

## 2017-08-30 ENCOUNTER — Other Ambulatory Visit: Payer: Self-pay

## 2017-08-30 ENCOUNTER — Encounter: Payer: Self-pay | Admitting: Cardiothoracic Surgery

## 2017-08-30 ENCOUNTER — Other Ambulatory Visit: Payer: Self-pay | Admitting: Family Medicine

## 2017-08-30 ENCOUNTER — Ambulatory Visit (INDEPENDENT_AMBULATORY_CARE_PROVIDER_SITE_OTHER): Payer: Medicare Other | Admitting: Cardiothoracic Surgery

## 2017-08-30 VITALS — BP 160/73 | HR 62 | Resp 18 | Ht 61.0 in | Wt 172.0 lb

## 2017-08-30 DIAGNOSIS — C3492 Malignant neoplasm of unspecified part of left bronchus or lung: Secondary | ICD-10-CM

## 2017-08-30 DIAGNOSIS — Z1231 Encounter for screening mammogram for malignant neoplasm of breast: Secondary | ICD-10-CM

## 2017-08-30 NOTE — Progress Notes (Signed)
[address]       [place] [address]             [phone]      Syniyah Rampy McCracken Medical Record #[id number] Date of Birth: 1935/07/17  Referring: Tanda Rockers, MD Primary Care: Glendale Chard, MD  Chief Complaint:   POST OP FOLLOW UP 04/07/2013  OPERATIVE REPORT  Preop diagnosis: Left lower lobe lung mass suspicious for primary carcinoma the lung  Postop diagnosis: Same, non-small cell lung cancer probably adenocarcinoma by frozen section  Procedure:  Bronchoscopy  Left video-assisted thoracoscopy  Left lower lobectomy and lymph node dissection  Placement of On-Q device  Surgeon: [name] MD   Stage IB ( pT2a,pN0,cM0)  Diagnosis 1. Lung, resection (segmental or lobe), Left lower lobe - INVASIVE ADENOCARCINOMA, POORLY DIFFERENTIATED, SPANNING 4.1 CM. - ADENOCARCINOMA INVOLVES PLEURA. - THE SURGICAL RESECTION MARGINS ARE NEGATIVE FOR ADENOCARCINOMA. - ONE BENIGN HILAR LYMPH NODE (0/1). - SEE ONCOLOGY TABLE BELOW. 2. Lymph node, biopsy, 8 L - THERE IS NO EVIDENCE OF CARCINOMA IN 1 OF 1 LYMPH NODE (0/1). 3. Lymph node, biopsy, 11 L - THERE IS NO EVIDENCE OF CARCINOMA IN 1 OF 1 LYMPH NODE (0/1). 4. Lymph node, biopsy, 12 L - THERE IS NO EVIDENCE OF CARCINOMA IN 1 OF 1 LYMPH NODE (0/1). 5. Lymph node, biopsy, 10 L - THERE IS NO EVIDENCE OF CARCINOMA IN 1 OF 1 LYMPH NODE (0/1). 6. Lymph node, biopsy, 4 L - THERE IS NO EVIDENCE OF CARCINOMA IN 1 OF 1 LYMPH NODE (0/1).  History of Present Illness:       Patient remains active 82 years.  She has been bothered by some low back pain with recent back injection. Following surgical resection 4-1/2 years ago.  She completed course of chemo in OCT 2015. She completed 4 cycles of adjuvant chemotherapy with navelbine and cisplatin    Past Medical History:  Diagnosis Date  . Arthritis   . Benign hypertensive kidney disease with chronic kidney disease stage I through stage IV, or  unspecified(403.10)    sees dr. Justin Mend again in april  . Cancer ([facility])    lung ca dx'd 02/2013  . Chronic kidney disease, stage III (moderate) ([facility])   . Cough 02/26/2013  . GERD (gastroesophageal reflux disease)   . Headache   . Hypertension   . Hyperthyroidism    took iodine treatment for this  . Lung mass 02/27/2013  . Neurologic disorder    2009  . Shortness of breath    exertion  . Thrombocytopenia, unspecified ([facility]) 02/26/2013  . Unspecified vitamin D deficiency      Social History   Tobacco Use  Smoking Status Former Smoker  . Packs/day: 0.50  . Years: 60.00  . Pack years: 30.00  . Types: Cigarettes  . Last attempt to quit: 02/25/2012  . Years since quitting: 5.5  Smokeless Tobacco Never Used    Social History   Substance and Sexual Activity  Alcohol Use No     Allergies  Allergen Reactions  . Aspirin Other (See Comments)    REACTION: stomach upset  . Lactose Intolerance (Gi) Diarrhea and Other (See Comments)    "can't drink milk. It tears my stomach up."  . Adhesive [Tape] Rash  . Lisinopril Other (See Comments)    cough  . Percocet [Oxycodone-Acetaminophen] Itching    Current Outpatient Medications  Medication Sig Dispense Refill  . acetaminophen (TYLENOL) 500 MG tablet  Take 1 tablet (500 mg total) by mouth every 6 (six) hours as needed. 30 tablet 0  . atorvastatin (LIPITOR) 10 MG tablet Take 1 tablet (10 mg total) by mouth daily at 6 PM. 90 tablet 1  . chlorthalidone (HYGROTON) 25 MG tablet Take 1 tablet (25 mg total) by mouth daily. 90 tablet 1  . Cholecalciferol (VITAMIN D-3) 1000 units CAPS Take 1 capsule (1,000 Units total) by mouth daily. 90 capsule 1  . clopidogrel (PLAVIX) 75 MG tablet Take 1 tablet (75 mg total) by mouth daily with breakfast. 90 tablet 3  . cyclobenzaprine (FLEXERIL) 10 MG tablet Take 1 tablet (10 mg total) by mouth 2 (two) times daily as needed for muscle spasms. 90 tablet 1  . magnesium oxide (MAG-OX) 400 MG tablet Take 1 tablet (400  mg total) by mouth daily. 90 tablet 1  . metoprolol succinate (TOPROL-XL) 25 MG 24 hr tablet Take 1 tablet (25 mg total) by mouth daily. 90 tablet 1  . Multiple Vitamin (MULTIVITAMIN) tablet Take 1 tablet by mouth at bedtime.     Marland Kitchen telmisartan (MICARDIS) 40 MG tablet Take 1 tablet (40 mg total) by mouth daily. 90 tablet 1  . TRAVATAN Z 0.004 % SOLN ophthalmic solution Place 1 drop into both eyes daily. In affected eye 5 mL 6   No current facility-administered medications for this visit.        Physical Exam: BP (!) 160/73 (BP Location: Right Arm, Patient Position: Sitting, Cuff Size: Normal)   Pulse 62   Resp 18   Ht 5\' 1"  (1.549 m)   Wt 172 lb (78 kg)   SpO2 96% Comment: RA  BMI 32.50 kg/m   General appearance: alert, cooperative, appears stated age and no distress Head: Normocephalic, without obvious abnormality, atraumatic Neck: no adenopathy, no carotid bruit, no JVD, supple, symmetrical, trachea midline and thyroid not enlarged, symmetric, no tenderness/mass/nodules Lymph nodes: Cervical, supraclavicular, and axillary nodes normal. Resp: clear to auscultation bilaterally Cardio: regular rate and rhythm, S1, S2 normal, no murmur, click, rub or gallop GI: soft, non-tender; bowel sounds normal; no masses,  no organomegaly Extremities: extremities normal, atraumatic, no cyanosis or edema and Homans sign is negative, no sign of DVT Neurologic: Grossly normal   Diagnostic Studies & Laboratory data:     Recent Radiology Findings: Study Result  CLINICAL DATA:  Patient with left lung cancer diagnosed in 2015 status post left lower lobe resection and chemotherapy. Follow-up evaluation.  EXAM: CT CHEST WITHOUT CONTRAST  TECHNIQUE: Multidetector CT imaging of the chest was performed following the standard protocol without IV contrast.  COMPARISON:  Chest CT 05/29/2016  FINDINGS: Cardiovascular: Normal heart size. No pericardial effusion. Thoracic aortic vascular  calcifications. Coronary arterial vascular calcifications.  Mediastinum/Nodes: No enlarged axillary, mediastinal or hilar lymphadenopathy. Small hiatal hernia. Otherwise normal appearance of the esophagus.  Lungs/Pleura: Central airways are patent. Patient status post left lower lobectomy. Stable peripheral subpleural scarring left lower hemithorax. Similar-appearing 7 mm left lower lobe nodule (image 76; series 5). Slight interval increase in size 6 mm right upper lobe nodule (image 47; series 5), previously 5 mm. Centrilobular and paraseptal emphysematous changes. No pleural effusion or pneumothorax.  Upper Abdomen: No acute process. Stable 9 mm low-attenuation lesion right hepatic lobe (image 131; series 2).  Musculoskeletal: Thoracic spine degenerative changes. No aggressive or acute appearing osseous lesions.  IMPRESSION: 1. Slight interval increase in size of right upper lobe nodule. Recommend attention on follow-up. 2. Stable appearance of the left hemithorax  with left lower lobectomy. 3. Aortic Atherosclerosis (ICD10-I70.0) and Emphysema (ICD10-J43.9).   Electronically Signed   By: Lovey Newcomer M.D.   On: 05/31/2017 10:53   CLINICAL DATA:  Lung cancer.  EXAM: CT CHEST WITHOUT CONTRAST  TECHNIQUE: Multidetector CT imaging of the chest was performed following the standard protocol without IV contrast.  COMPARISON:  05/31/2015  FINDINGS: Cardiovascular: The heart size is normal. No pericardial effusion. Coronary artery calcification is noted. Atherosclerotic calcification is noted in the wall of the thoracic aorta.  Mediastinum/Nodes: No mediastinal lymphadenopathy. No evidence for gross hilar lymphadenopathy although assessment is limited by the lack of intravenous contrast on today's study. There is no axillary lymphadenopathy. Asymmetric nodular enlargement left thyroid lobe is stable. The esophagus has normal imaging  features.  Lungs/Pleura: Emphysema is associated with bronchial wall thickening. 3 mm right upper lobe nodule (image 49 series 5) is stable in the interval. Volume loss left hemithorax compatible with left lower lobectomy. Scarring in the lower left lung is unchanged. No focal airspace consolidation. No pulmonary edema or pleural effusion.  Upper Abdomen: 10 mm low-density lesion posterior right liver is stable in the interval.  Musculoskeletal: Bone windows reveal no worrisome lytic or sclerotic osseous lesions.  IMPRESSION: 1. Stable exam. Status post left lower lobectomy without evidence for new or progressive findings. 2. Emphysema. 3. Thoracic aortic atherosclerosis.   Electronically Signed   By: Misty Stanley M.D.   On: 05/29/2016 15:25    I have independently reviewed the above radiology studies  and reviewed the findings with the patient.   CLINICAL DATA:  Restaging non small cell lung cancer. Status post left lower lobe resection. Chemotherapy complete.  EXAM: CT CHEST WITHOUT CONTRAST  TECHNIQUE: Multidetector CT imaging of the chest was performed following the standard protocol without IV contrast.  COMPARISON:  12/31/2014  FINDINGS: Mediastinum/Nodes: No breast masses, supraclavicular or axillary lymphadenopathy. Stable multinodular goiter involving the left thyroid lobe.  The heart is normal in size. No pericardial effusion. Stable aortic and coronary artery calcifications.  No mediastinal or hilar mass or adenopathy. Small scattered lymph nodes are stable. The esophagus is grossly normal. There is a small hiatal hernia.  Lungs/Pleura: Stable emphysematous changes and pulmonary scarring. Stable surgical changes from a left lower lobe lung resection. Stable left basilar pleural and parenchymal scarring changes. No findings worrisome for recurrent tumor. No metastatic pulmonary nodules. No bronchiectasis or interstitial lung  disease.  Upper abdomen: No significant findings.  Musculoskeletal: No significant findings. Stable degenerative changes involving the spine.  IMPRESSION: Status post resection of a left lower lobe lung lesion without findings for recurrent tumor or metastatic disease.  No mediastinal or hilar mass or adenopathy.  Stable emphysematous changes but no acute pulmonary findings.  Stable atherosclerotic calcifications involving the aorta and branch vessels.  Stable left thyroid goiter.   Electronically Signed   By: Marijo Sanes M.D.   On: 05/31/2015 15:42     Ct Chest Wo Contrast  12/31/2014  CLINICAL DATA:  Subsequent evaluation of a 82 year old female with history of non-small cell carcinoma both the left lower lobe diagnosed in January 2015, status post left lower lobectomy and chemotherapy (completed). EXAM: CT CHEST WITHOUT CONTRAST TECHNIQUE: Multidetector CT imaging of the chest was performed following the standard protocol without IV contrast. COMPARISON:  Chest CT 07/23/2014. FINDINGS: Mediastinum/Lymph Nodes: Heart size is normal. There is no significant pericardial fluid, thickening or pericardial calcification. There is atherosclerosis of the thoracic aorta, the great vessels of the mediastinum  and the coronary arteries, including calcified atherosclerotic plaque in the left anterior descending, left circumflex and right coronary arteries. No pathologically enlarged mediastinal or hilar lymph nodes. Please note that accurate exclusion of hilar adenopathy is limited on noncontrast CT scans. Asymmetrically enlarged left lobe of thyroid gland which appears to be occupied by a dominant partially calcified nodule, which is slightly larger than the prior examination, measuring 2.8 x 3.2 cm on today's study (image 8 of series 2). Small hiatal hernia. No axillary lymphadenopathy. Lungs/Pleura: Status post left lower lobectomy. Compensatory hyperexpansion of the left upper lobe.  Small amount of peripheral pleuroparenchymal thickening in the inferior aspect of the left upper lobe, similar to the prior study, most compatible with some mild chronic scarring. No suspicious appearing pulmonary nodules or masses. No acute consolidative airspace disease. No pleural effusions. Mild diffuse bronchial wall thickening with mild centrilobular and paraseptal emphysema. Upper Abdomen: Atherosclerosis. 4 mm low-attenuation lesion in segment 2 of the liver again noted, incompletely characterized on today's noncontrast CT examination, but likely a tiny cyst. Musculoskeletal/Soft Tissues: Orthopedic fixation hardware in the lower cervical spine incompletely imaged. There are no aggressive appearing lytic or blastic lesions noted in the visualized portions of the skeleton. IMPRESSION: 1. Postoperative changes of left lower lobectomy. No findings to suggest recurrent or metastatic disease in the thorax. 2. Slight enlargement of a heterogeneous appearing nodule in the left lobe of the thyroid gland which is partially calcified measuring 2.8 x 3.2 cm on today's study. Further evaluation with nonemergent thyroid ultrasound is recommended at this time. 3. Mild diffuse bronchial wall thickening with mild centrilobular and paraseptal emphysema; imaging findings suggestive of underlying COPD. 4. Atherosclerosis, including three-vessel coronary artery disease. Assessment for potential risk factor modification, dietary therapy or pharmacologic therapy may be warranted, if clinically indicated. Electronically Signed   By: Vinnie Langton M.D.   On: 12/31/2014 14:54     CLINICAL DATA: Lung cancer, completed chemotherapy. Shortness of breath.  EXAM: CT CHEST WITH CONTRAST  TECHNIQUE: Multidetector CT imaging of the chest was performed during intravenous contrast administration.  CONTRAST: 58mL OMNIPAQUE IOHEXOL 300 MG/ML SOLN  COMPARISON: 07/01/2013  FINDINGS: 3.1 by 2.6 cm solid and partially  calcified left thyroid nodule, stable.  Left subclavian Port-A-Cath tip: SVC.  Atherosclerotic aortic arch. Coronary artery atherosclerosis observed in the left anterior descending artery.  The trace left pleural effusion has nearly completely resolved. Focal pleural thickening or adjacent mild rounded atelectasis in the left lower lung is reduced in size and prominence on image 35 of series 2. This currently measures up to 6 mm in thickness.  No pathologic thoracic adenopathy.  Left lower lobectomy.  Indistinct 6 by 7 mm by 4 mm ground-glass density in the left lung on image 32 of series 5 posteriorly in, stable.  Thoracic spondylosis. Lower cervical plate and screw fixator.  Centrilobular emphysema.  Small focus of enhancement along the left side of the lateral segment left hepatic lobe, image 48 of series 2, stable, not previously hypermetabolic on prior PET-CT, probably flash filling of a hemangioma or a small perfusion anomaly.  IMPRESSION: 1. No findings of recurrence or significant change. 6 by 7 by 4 mm slightly sub solid left posterior nodule merits observation. 2. Stable left thyroid nodule. This was not previously hypermetabolic. 3. Stable small enhancing focus in the lateral segment left hepatic lobe, probably a flash filling hemangioma or perfusion anomaly, not previously hypermetabolic. 4. Centrilobular emphysema. 5. Left lower lobectomy. Focal scarring posteriorly in the remaining  lower posterior left lung, with reduced size of the trace pleural effusion. 6. Atherosclerosis.   Electronically Signed  By: Sherryl Barters M.D.  On: 12/31/2013 15:39  Recent Lab Findings: Lab Results  Component Value Date   WBC 7.5 05/30/2017   HGB 11.0 (L) 05/30/2017   HCT 32.8 (L) 05/30/2017   PLT 297 05/30/2017   GLUCOSE 103 05/30/2017   ALT 19 05/30/2017   AST 20 05/30/2017   NA 141 05/30/2017   K 4.2 05/30/2017   CL 110 (H) 05/30/2017    CREATININE 1.71 (H) 05/30/2017   BUN 22 05/30/2017   CO2 25 05/30/2017   INR 0.96 05/06/2013      Assessment / Plan:   Patient stable for half years after resection of 1B carcinoma of the left lower lobe.  She has a follow-up CT scan arranged by oncology for follow-up of small right upper lobe lung nodule.  Plan to see her back after the CT scan is done June 2020.  [name] MD      [address] [place] [address] Office [phone]   Beeper [phone]  08/30/2017 12:31 PM

## 2017-09-18 DIAGNOSIS — N189 Chronic kidney disease, unspecified: Secondary | ICD-10-CM | POA: Diagnosis not present

## 2017-09-18 DIAGNOSIS — N183 Chronic kidney disease, stage 3 (moderate): Secondary | ICD-10-CM | POA: Diagnosis not present

## 2017-09-18 DIAGNOSIS — I129 Hypertensive chronic kidney disease with stage 1 through stage 4 chronic kidney disease, or unspecified chronic kidney disease: Secondary | ICD-10-CM | POA: Diagnosis not present

## 2017-09-18 DIAGNOSIS — D631 Anemia in chronic kidney disease: Secondary | ICD-10-CM | POA: Diagnosis not present

## 2017-09-18 DIAGNOSIS — C3490 Malignant neoplasm of unspecified part of unspecified bronchus or lung: Secondary | ICD-10-CM | POA: Diagnosis not present

## 2017-09-18 DIAGNOSIS — N2581 Secondary hyperparathyroidism of renal origin: Secondary | ICD-10-CM | POA: Diagnosis not present

## 2017-10-04 ENCOUNTER — Ambulatory Visit
Admission: RE | Admit: 2017-10-04 | Discharge: 2017-10-04 | Disposition: A | Discharge status: Home / Self Care | Payer: Medicare Other | Source: Ambulatory Visit | Attending: Family Medicine | Admitting: Family Medicine

## 2017-10-04 DIAGNOSIS — Z1231 Encounter for screening mammogram for malignant neoplasm of breast: Secondary | ICD-10-CM

## 2017-10-17 DIAGNOSIS — H402231 Chronic angle-closure glaucoma, bilateral, mild stage: Secondary | ICD-10-CM | POA: Diagnosis not present

## 2017-11-01 ENCOUNTER — Ambulatory Visit (INDEPENDENT_AMBULATORY_CARE_PROVIDER_SITE_OTHER): Payer: Medicare Other | Admitting: Family Medicine

## 2017-11-01 ENCOUNTER — Encounter: Payer: Self-pay | Admitting: Family Medicine

## 2017-11-01 ENCOUNTER — Other Ambulatory Visit: Payer: Self-pay

## 2017-11-01 VITALS — BP 140/64 | HR 63 | Temp 98.0°F | Resp 17 | Ht 61.0 in | Wt 167.4 lb

## 2017-11-01 DIAGNOSIS — Z0000 Encounter for general adult medical examination without abnormal findings: Secondary | ICD-10-CM | POA: Diagnosis not present

## 2017-11-01 DIAGNOSIS — Z23 Encounter for immunization: Secondary | ICD-10-CM | POA: Diagnosis not present

## 2017-11-01 MED ORDER — TETANUS-DIPHTHERIA TOXOIDS TD 5-2 LFU IM INJ: 0.5000 mL | INJECTION | Freq: Once | INTRAMUSCULAR | 0 refills | Status: AC

## 2017-11-01 NOTE — Progress Notes (Signed)
QUICK REFERENCE INFORMATION: The ABCs of Providing the Annual Wellness Visit  CMS.gov Medicare Learning Network  [employer] Annual Wellness Visit  Subjective:   [name] is a 82 y.o. Female who presents for an Annual Wellness Visit.  Patient Active Problem List   Diagnosis Date Noted  . Sacroiliac joint disease 08/02/2017  . History of TIA (transient ischemic attack) 08/02/2017  . Vitamin D deficiency 08/02/2017  . Arthritis 08/02/2017  . Long term current use of anticoagulant therapy 08/02/2017  . Muscle twitching 05/30/2016  . Left thyroid nodule 01/01/2015  . Benign hypertension with CKD (chronic kidney disease) stage IV ([place]) 11/06/2013  . Leukocytosis 07/23/2013  . Incisional pain 07/02/2013  . Anemia in chronic kidney disease 05/28/2013  . Constipation 05/28/2013  . History of lung cancer 04/07/2013  . Essential hypertension, benign 03/05/2013  . Personal history of colonic polyps 10/29/2012    Past Medical History:  Diagnosis Date  . Arthritis   . Benign hypertensive kidney disease with chronic kidney disease stage I through stage IV, or unspecified(403.10)    sees dr. Justin Mend again in april  . Cancer ([place])    lung ca dx'd 02/2013  . Chronic kidney disease, stage III (moderate) (HCC)   . Cough 02/26/2013  . GERD (gastroesophageal reflux disease)   . Headache   . Hypertension   . Hyperthyroidism    took iodine treatment for this  . Lung mass 02/27/2013  . Neurologic disorder    2009  . Shortness of breath    exertion  . Thrombocytopenia, unspecified ([place]) 02/26/2013  . Unspecified vitamin D deficiency      Past Surgical History:  Procedure Laterality Date  . BACK SURGERY    . EYE SURGERY     cataract  . EYE SURGERY Left    open up tear duct  . LYMPH NODE DISSECTION Left 04/07/2013   Procedure: LYMPH NODE DISSECTION;  Surgeon: [name], MD;  Location: [place];  Service: Thoracic;  Laterality: Left;  . NECK SURGERY    . PORT-A-CATH REMOVAL Left  01/09/2014   Procedure: REMOVAL PORT-A-CATH;  Surgeon: [name], MD;  Location: [place];  Service: Thoracic;  Laterality: Left;  . PORTACATH PLACEMENT Left 05/06/2013   Procedure: INSERTION PORT-A-CATH;  Surgeon: [name], MD;  Location: [facility];  Service: Thoracic;  Laterality: Left;  Marland Kitchen VIDEO ASSISTED THORACOSCOPY (VATS)/ LOBECTOMY Left 04/07/2013   Procedure: VIDEO ASSISTED THORACOSCOPY (VATS)/ LOBECTOMY; INSERTION OF ON-Q PUMP;  Surgeon: [name], MD;  Location: [place];  Service: Thoracic;  Laterality: Left;  Marland Kitchen VIDEO BRONCHOSCOPY N/A 04/07/2013   Procedure: VIDEO BRONCHOSCOPY;  Surgeon: [name], MD;  Location: [facility] OR;  Service: Thoracic;  Laterality: N/A;     Outpatient Medications Prior to Visit  Medication Sig Dispense Refill  . acetaminophen (TYLENOL) 500 MG tablet Take 1 tablet (500 mg total) by mouth every 6 (six) hours as needed. 30 tablet 0  . atorvastatin (LIPITOR) 10 MG tablet Take 1 tablet (10 mg total) by mouth daily at 6 PM. 90 tablet 1  . chlorthalidone (HYGROTON) 25 MG tablet Take 1 tablet (25 mg total) by mouth daily. 90 tablet 1  . Cholecalciferol (VITAMIN D-3) 1000 units CAPS Take 1 capsule (1,000 Units total) by mouth daily. 90 capsule 1  . clopidogrel (PLAVIX) 75 MG tablet Take 1 tablet (75 mg total) by mouth daily with breakfast. 90 tablet 3  . cyclobenzaprine (FLEXERIL) 10 MG tablet Take 1 tablet (10 mg total)  by mouth 2 (two) times daily as needed for muscle spasms. 90 tablet 1  . magnesium oxide (MAG-OX) 400 MG tablet Take 1 tablet (400 mg total) by mouth daily. 90 tablet 1  . metoprolol succinate (TOPROL-XL) 25 MG 24 hr tablet Take 1 tablet (25 mg total) by mouth daily. 90 tablet 1  . Multiple Vitamin (MULTIVITAMIN) tablet Take 1 tablet by mouth at bedtime.     Marland Kitchen telmisartan (MICARDIS) 40 MG tablet Take 1 tablet (40 mg total) by mouth daily. 90 tablet 1  . TRAVATAN Z 0.004 % SOLN ophthalmic solution Place 1 drop into both eyes daily. In  affected eye 5 mL 6   No facility-administered medications prior to visit.     Allergies  Allergen Reactions  . Aspirin Other (See Comments)    REACTION: stomach upset  . Lactose Intolerance (Gi) Diarrhea and Other (See Comments)    "can't drink milk. It tears my stomach up."  . Adhesive [Tape] Rash  . Lisinopril Other (See Comments)    cough  . Percocet [Oxycodone-Acetaminophen] Itching     Family History  Problem Relation Age of Onset  . Lung cancer Brother        smoked  . Breast cancer Mother   . Lung cancer Father        smoked  . Rectal cancer Neg Hx   . Stomach cancer Neg Hx   . Esophageal cancer Neg Hx   . Colon cancer Neg Hx      Social History   Socioeconomic History  . Marital status: Single    Spouse name: Not on file  . Number of children: 2  . Years of education: Not on file  . Highest education level: Not on file  Occupational History  . Not on file  Social Needs  . Financial resource strain: Not on file  . Food insecurity:    Worry: Not on file    Inability: Not on file  . Transportation needs:    Medical: Not on file    Non-medical: Not on file  Tobacco Use  . Smoking status: Former Smoker    Packs/day: 0.50    Years: 60.00    Pack years: 30.00    Types: Cigarettes    Last attempt to quit: 02/25/2012    Years since quitting: 5.6  . Smokeless tobacco: Never Used  Substance and Sexual Activity  . Alcohol use: No  . Drug use: No  . Sexual activity: Not on file  Lifestyle  . Physical activity:    Days per week: Not on file    Minutes per session: Not on file  . Stress: Not on file  Relationships  . Social connections:    Talks on phone: Not on file    Gets together: Not on file    Attends religious service: Not on file    Active member of club or organization: Not on file    Attends meetings of clubs or organizations: Not on file    Relationship status: Not on file  Other Topics Concern  . Not on file  Social History Narrative   . Not on file      Recent Hospitalizations? No  Health Habits: Current exercise activities include: yard work Exercise: 3 times/week. Diet: in general, a "healthy" diet    Alcohol intake: no  Health Risk Assessment: The patient has completed a Health Risk Assessment. This has been reveiwed with them and has been scanned into the [facility] system as  an attached document.  Current Medical Providers and Suppliers: [facility] Patient Care Team: Glendale Chard, MD as PCP - General (Internal Medicine) Heath Lark, MD as Consulting Physician (Hematology and Oncology) [name], MD as Consulting Physician (Cardiothoracic Surgery) Future Appointments  Date Time Provider [place]  04/24/2018 11:00 AM [facility] None  04/25/2018 12:00 PM Heath Lark, MD [facility] None     Age-appropriate Screening Schedule: The list below includes current immunization status and future screening recommendations based on patient's age. Orders for these recommended tests are listed in the plan section. The patient has been provided with a written plan. Immunization History  Administered Date(s) Administered  . Influenza Split 10/23/2012, 10/22/2013, 10/12/2015  . Pneumococcal Polysaccharide-23 04/11/2013    Health Maintenance reviewed   Depression Screen-PHQ2/9 completed today  Depression screen [facility] 2/9 11/01/2017 06/29/2017  Decreased Interest 0 0  Down, Depressed, Hopeless 0 0  PHQ - 2 Score 0 0       Depression Severity and Treatment Recommendations:  0-4= None  5-9= Mild / Treatment: Support, educate to call if worse; return in one month  10-14= Moderate / Treatment: Support, watchful waiting; Antidepressant or Psycotherapy  15-19= Moderately severe / Treatment: Antidepressant OR Psychotherapy  >= 20 = Major depression, severe / Antidepressant AND Psychotherapy  Functional Status Survey:   Is the patient deaf or have difficulty hearing?: Yes(hearing aid- loss  of hearing) Does the patient have difficulty seeing, even when wearing glasses/contacts?: No Does the patient have difficulty concentrating, remembering, or making decisions?: No Does the patient have difficulty walking or climbing stairs?: Yes(problem with low back that radiates into leg.  Saw dr and gave shot and started pt on gabapentin.  Pt has a pinched nerve in back) Does the patient have difficulty dressing or bathing?: No Does the patient have difficulty doing errands alone such as visiting a doctor's office or shopping?: No   Advanced Care Planning: 1. Patient has executed an Advance Directive: No 2. If no, patient was given the opportunity to execute an Advance Directive today? No 3. Are the patient's advanced directives in [place]? No 4. This patient has the ability to prepare an Advance Directive: No 5. Provider is willing to follow the patient's wishes: No  Cognitive Assessment: Does the patient have evidence of cognitive impairment? No The patient does not have any evidence of any cognitive problems and denies any  change in mood/affect, appearance, speech, memory or motor skills.  Identification of Risk Factors: Risk factors include: hypertension  Review of Systems  Constitutional: Negative for chills and fever.  Respiratory: Negative for cough and shortness of breath.   Cardiovascular: Negative for chest pain, palpitations and orthopnea.  Gastrointestinal: Negative for abdominal pain, nausea and vomiting.  Genitourinary: Negative for dysuria and urgency.  Musculoskeletal: Negative for myalgias and neck pain.  Skin: Negative for itching and rash.  Neurological: Negative for dizziness, tingling and headaches.  Psychiatric/Behavioral: Negative for depression. The patient is not nervous/anxious.     Objective:   Vitals:   11/01/17 1112 11/01/17 1125  BP: (!) 156/72 140/64  Pulse: 63   Resp: 17   Temp: 98 F (36.7 C)   TempSrc: Oral   SpO2: 96%   Weight: 167 lb  6.4 oz (75.9 kg)   Height: 5\' 1"  (1.549 m)     Body mass index is 31.63 kg/m.  Physical Exam  Constitutional: She is oriented to person, place, and time. She appears well-developed and well-nourished.  HENT:  Head:  Normocephalic and atraumatic.  Eyes: Conjunctivae and EOM are normal.  Cardiovascular: Normal rate, regular rhythm and normal heart sounds.  No murmur heard. Pulmonary/Chest: Effort normal and breath sounds normal. No stridor. No respiratory distress. She has no wheezes.  Neurological: She is alert and oriented to person, place, and time.  Skin: Skin is warm. Capillary refill takes less than 2 seconds.  Psychiatric: She has a normal mood and affect. Her behavior is normal. Judgment and thought content normal.      Assessment/Plan:   Patient Self-Management and Personalized Health Advice The patient has been provided with information about:  follow a low fat, low cholesterol diet and reduce salt in diet and cooking  During the course of the visit the patient was educated and counseled about appropriate screening and preventive services including:   return annually or prn     Body mass index is 31.63 kg/m. Discussed the patient's BMI with her. The BMI BMI is not in the acceptable range; no BMI management plan is appropriate.  Twala was seen today for annual exam.  Diagnoses and all orders for this visit:  Encounter for Medicare annual wellness exam- age appropriate vaccines reviewed  Need for vaccine for Td (tetanus-diphtheria) -     tetanus & diphtheria toxoids, adult, (TENIVAC) 5-2 LFU injection; Inject 0.5 mLs into the muscle once for 1 dose.  Flu vaccine need  Other orders -     Flu vaccine HIGH DOSE PF (Fluzone High dose)      No follow-ups on file.  Future Appointments  Date Time Provider [place]  04/24/2018 11:00 AM [facility] 2 [facility] None  04/25/2018 12:00 PM Heath Lark, MD [facility] None    Patient Instructions        If you have lab work done today you will be contacted with your lab results within the next 2 weeks.  If you have not heard from [place] then please contact us. The fastest way to get your results is to register for My Chart.   IF you received an x-ray today, you will receive an invoice from [facility]. Please contact [facility] at [phone] with questions or concerns regarding your invoice.   IF you received labwork today, you will receive an invoice from [facility]. Please contact [facility] at [phone] with questions or concerns regarding your invoice.   Our billing staff will not be able to assist you with questions regarding bills from these companies.  You will be contacted with the lab results as soon as they are available. The fastest way to get your results is to activate your My Chart account. Instructions are located on the last page of this paperwork. If you have not heard from [place] regarding the results in 2 weeks, please contact this office.       An after visit summary with all of these plans was given to the patient.

## 2017-11-01 NOTE — Patient Instructions (Addendum)
If you have lab work done today you will be contacted with your lab results within the next 2 weeks.  If you have not heard from [place] then please contact us. The fastest way to get your results is to register for My Chart.   IF you received an x-ray today, you will receive an invoice from [facility]. Please contact [facility] at [phone] with questions or concerns regarding your invoice.   IF you received labwork today, you will receive an invoice from [place]. Please contact [facility] at [phone] with questions or concerns regarding your invoice.   Our billing staff will not be able to assist you with questions regarding bills from these companies.  You will be contacted with the lab results as soon as they are available. The fastest way to get your results is to activate your My Chart account. Instructions are located on the last page of this paperwork. If you have not heard from [place] regarding the results in 2 weeks, please contact this office.     Advance Directive Advance directives are legal documents that let you make choices ahead of time about your health care and medical treatment in case you become unable to communicate for yourself. Advance directives are a way for you to communicate your wishes to family, friends, and health care providers. This can help convey your decisions about end-of-life care if you become unable to communicate. Discussing and writing advance directives should happen over time rather than all at once. Advance directives can be changed depending on your situation and what you want, even after you have signed the advance directives. If you do not have an advance directive, some states assign family decision makers to act on your behalf based on how closely you are related to them. Each state has its own laws regarding advance directives. You may want to check with your health care provider, attorney, or state representative about the  laws in your state. There are different types of advance directives, such as:  Medical power of attorney.  Living will.  Do not resuscitate (DNR) or do not attempt resuscitation (DNAR) order.  Health care proxy and medical power of attorney A health care proxy, also called a health care agent, is a person who is appointed to make medical decisions for you in cases in which you are unable to make the decisions yourself. Generally, people choose someone they know well and trust to represent their preferences. Make sure to ask this person for an agreement to act as your proxy. A proxy may have to exercise judgment in the event of a medical decision for which your wishes are not known. A medical power of attorney is a legal document that names your health care proxy. Depending on the laws in your state, after the document is written, it may also need to be:  Signed.  Notarized.  Dated.  Copied.  Witnessed.  Incorporated into your medical record.  You may also want to appoint someone to manage your financial affairs in a situation in which you are unable to do so. This is called a durable power of attorney for finances. It is a separate legal document from the durable power of attorney for health care. You may choose the same person or someone different from your health care proxy to act as your agent in financial matters. If you do not appoint a proxy, or if there is a concern that the proxy is not acting in your  best interests, a court-appointed guardian may be designated to act on your behalf. Living will A living will is a set of instructions documenting your wishes about medical care when you cannot express them yourself. Health care providers should keep a copy of your living will in your medical record. You may want to give a copy to family members or friends. To alert caregivers in case of an emergency, you can place a card in your wallet to let them know that you have a living will and  where they can find it. A living will is used if you become:  Terminally ill.  Incapacitated.  Unable to communicate or make decisions.  Items to consider in your living will include:  The use or non-use of life-sustaining equipment, such as dialysis machines and breathing machines (ventilators).  A DNR or DNAR order, which is the instruction not to use cardiopulmonary resuscitation (CPR) if breathing or heartbeat stops.  The use or non-use of tube feeding.  Withholding of food and fluids.  Comfort (palliative) care when the goal becomes comfort rather than a cure.  Organ and tissue donation.  A living will does not give instructions for distributing your money and property if you should pass away. It is recommended that you seek the advice of a lawyer when writing a will. Decisions about taxes, beneficiaries, and asset distribution will be legally binding. This process can relieve your family and friends of any concerns surrounding disputes or questions that may come up about the distribution of your assets. DNR or DNAR A DNR or DNAR order is a request not to have CPR in the event that your heart stops beating or you stop breathing. If a DNR or DNAR order has not been made and shared, a health care provider will try to help any patient whose heart has stopped or who has stopped breathing. If you plan to have surgery, talk with your health care provider about how your DNR or DNAR order will be followed if problems occur. Summary  Advance directives are the legal documents that allow you to make choices ahead of time about your health care and medical treatment in case you become unable to communicate for yourself.  The process of discussing and writing advance directives should happen over time. You can change the advance directives, even after you have signed them.  Advance directives include DNR or DNAR orders, living wills, and designating an agent as your medical power of  attorney. This information is not intended to replace advice given to you by your health care provider. Make sure you discuss any questions you have with your health care provider. Document Released: 04/18/2007 Document Revised: 11/29/2015 Document Reviewed: 11/29/2015 Elsevier Interactive Patient Education  2017 [employer].

## 2018-01-31 ENCOUNTER — Other Ambulatory Visit: Payer: Self-pay | Admitting: Family Medicine

## 2018-01-31 NOTE — Telephone Encounter (Signed)
Requested Prescriptions  Pending Prescriptions Disp Refills  . atorvastatin (LIPITOR) 10 MG tablet [Pharmacy Med Name: ATORVASTATIN 10 MG TABLET] 90 tablet 0    Sig: TAKE 1 TABLET (10 MG TOTAL) BY MOUTH DAILY AT 6 PM.     Cardiovascular:  Antilipid - Statins Failed - 01/31/2018  2:12 AM      Failed - Total Cholesterol in normal range and within 360 days    No results found for: CHOL, POCCHOL       Failed - LDL in normal range and within 360 days    No results found for: LDLCALC, LDLC, HIRISKLDL       Failed - HDL in normal range and within 360 days    No results found for: HDL       Failed - Triglycerides in normal range and within 360 days    No results found for: TRIG       Passed - Patient is not pregnant      Passed - Valid encounter within last 12 months    Recent Outpatient Visits          3 months ago Encounter for [employer] annual wellness exam   Primary Care at [facility], Zoe A, MD   6 months ago Anemia in stage 4 chronic kidney disease ([place])   Primary Care at [place], MD   7 months ago Establishing care with new doctor, encounter for   Primary Care at [place], MD      Future Appointments            In 2 months Forrest Moron, MD Primary Care at [place], [facility]

## 2018-03-17 ENCOUNTER — Other Ambulatory Visit: Payer: Self-pay | Admitting: Family Medicine

## 2018-03-18 NOTE — Telephone Encounter (Signed)
Requested Prescriptions  Pending Prescriptions Disp Refills  . chlorthalidone (HYGROTON) 25 MG tablet [Pharmacy Med Name: CHLORTHALIDONE 25 MG TABLET] 90 tablet 0    Sig: TAKE 1 TABLET BY MOUTH EVERY DAY     Cardiovascular: Diuretics - Thiazide Failed - 03/17/2018  9:15 AM      Failed - Cr in normal range and within 360 days    Creatinine  Date Value Ref Range Status  05/29/2016 1.7 (H) 0.6 - 1.1 mg/dL Final   Creatinine, Ser  Date Value Ref Range Status  05/30/2017 1.71 (H) 0.60 - 1.10 mg/dL Final         Failed - Last BP in normal range    BP Readings from Last 1 Encounters:  11/01/17 140/64         Passed - Ca in normal range and within 360 days    Calcium  Date Value Ref Range Status  05/30/2017 9.1 8.4 - 10.4 mg/dL Final  05/29/2016 9.5 8.4 - 10.4 mg/dL Final         Passed - K in normal range and within 360 days    Potassium  Date Value Ref Range Status  05/30/2017 4.2 3.5 - 5.1 mmol/L Final  05/29/2016 4.4 3.5 - 5.1 mEq/L Final         Passed - Na in normal range and within 360 days    Sodium  Date Value Ref Range Status  05/30/2017 141 136 - 145 mmol/L Final  05/29/2016 144 136 - 145 mEq/L Final         Passed - Valid encounter within last 6 months    Recent Outpatient Visits          4 months ago Encounter for Medicare annual wellness exam   Primary Care at [facility], Zoe A, MD   7 months ago Anemia in stage 4 chronic kidney disease ([place])   Primary Care at [place], MD   8 months ago Establishing care with new doctor, encounter for   Primary Care at [place], MD      Future Appointments            In 1 month Forrest Moron, MD Primary Care at [place], [facility]

## 2018-03-27 ENCOUNTER — Other Ambulatory Visit: Payer: Self-pay | Admitting: Internal Medicine

## 2018-03-27 NOTE — Telephone Encounter (Signed)
Copied from [place] [id number]. Topic: Quick Communication - Rx Refill/Question >> Mar 27, 2018  9:30 AM Alanda Slim E wrote: Medication: metoprolol succinate (TOPROL-XL) 25 MG 24 hr tablet  clopidogrel (PLAVIX) 75 MG tablet cyclobenzaprine (FLEXERIL) 10 MG tablet magnesium oxide (MAG-OX) 400 MG tablet telmisartan (MICARDIS) 40 MG tablet TRAVATAN Z 0.004 % SOLN ophthalmic solution Cholecalciferol (VITAMIN D-3) 1000 units CAPS  Has the patient contacted their pharmacy? Yes - no refills on Rx   Preferred Pharmacy (with phone number or street name): [id number] [place], [place] [phone] (Phone) [phone] (Fax)    Agent: Please be advised that RX refills may take up to 3 business days. We ask that you follow-up with your pharmacy.

## 2018-04-01 ENCOUNTER — Other Ambulatory Visit: Payer: Self-pay | Admitting: Family Medicine

## 2018-04-01 NOTE — Telephone Encounter (Signed)
Requested Prescriptions  Pending Prescriptions Disp Refills  . telmisartan (MICARDIS) 40 MG tablet [Pharmacy Med Name: TELMISARTAN 40 MG TABLET] 90 tablet 0    Sig: TAKE 1 TABLET BY MOUTH EVERY DAY     Cardiovascular:  Angiotensin Receptor Blockers Failed - 04/01/2018  2:09 AM      Failed - Cr in normal range and within 180 days    Creatinine  Date Value Ref Range Status  05/29/2016 1.7 (H) 0.6 - 1.1 mg/dL Final   Creatinine, Ser  Date Value Ref Range Status  05/30/2017 1.71 (H) 0.60 - 1.10 mg/dL Final         Failed - K in normal range and within 180 days    Potassium  Date Value Ref Range Status  05/30/2017 4.2 3.5 - 5.1 mmol/L Final  05/29/2016 4.4 3.5 - 5.1 mEq/L Final         Failed - Last BP in normal range    BP Readings from Last 1 Encounters:  11/01/17 140/64         Passed - Patient is not pregnant      Passed - Valid encounter within last 6 months    Recent Outpatient Visits          5 months ago Encounter for [employer] annual wellness exam   Primary Care at [facility], Zoe A, MD   8 months ago Anemia in stage 4 chronic kidney disease ([place])   Primary Care at [facility], Arlie Solomons, MD   9 months ago Establishing care with new doctor, encounter for   Primary Care at [place], MD      Future Appointments            In 4 weeks Forrest Moron, MD Primary Care at [place], [facility]

## 2018-04-16 ENCOUNTER — Telehealth: Payer: Self-pay

## 2018-04-16 NOTE — Telephone Encounter (Signed)
Spoke with pt by phone and gave new appt date and time in July

## 2018-04-24 ENCOUNTER — Other Ambulatory Visit: Payer: Medicare Other

## 2018-04-25 ENCOUNTER — Ambulatory Visit: Payer: Medicare Other | Admitting: Hematology and Oncology

## 2018-04-26 ENCOUNTER — Other Ambulatory Visit: Payer: Self-pay | Admitting: Family Medicine

## 2018-04-29 ENCOUNTER — Ambulatory Visit: Payer: Medicare Other | Admitting: Family Medicine

## 2018-06-11 ENCOUNTER — Other Ambulatory Visit: Payer: Self-pay | Admitting: Family Medicine

## 2018-06-24 ENCOUNTER — Ambulatory Visit: Payer: Medicare Other | Admitting: Family Medicine

## 2018-06-25 ENCOUNTER — Other Ambulatory Visit: Payer: Self-pay | Admitting: Family Medicine

## 2018-06-25 ENCOUNTER — Other Ambulatory Visit: Payer: Self-pay

## 2018-06-25 ENCOUNTER — Telehealth: Payer: Self-pay | Admitting: Family Medicine

## 2018-06-25 MED ORDER — TELMISARTAN 40 MG PO TABS: 40.0000 mg | ORAL_TABLET | Freq: Every day | ORAL | 0 refills | Status: DC

## 2018-06-25 NOTE — Telephone Encounter (Signed)
Rx sent to pharmacy for 30 days.  

## 2018-06-25 NOTE — Telephone Encounter (Unsigned)
Copied from [place] [phone]. Topic: Quick Communication - Rx Refill/Question >> Jun 25, 2018 10:13 AM Loma [place] wrote: CRM for notification. See Telephone encounter for: 06/25/18.telmisartan (MICARDIS) 40 MG tablet  Pt needs refill, states received message that would not be filled without appt, Appt is for 7/7 w/t [place]. Please send to telmisartan (MICARDIS) 40 MG tablet [id number] [place] [place] [address] [phone] (Phone) [phone] (Fax

## 2018-06-25 NOTE — Telephone Encounter (Signed)
Copied from [place] [phone]. Topic: Quick Communication - Rx Refill/Question >> Jun 25, 2018 10:13 AM Loma Boston wrote: CRM for notification. See Telephone encounter for: 06/25/18.telmisartan (MICARDIS) 40 MG tablet  Pt needs refill, states received message that would not be filled without appt, Appt is for 7/7 w/t [place]. Please send to telmisartan (MICARDIS) 40 MG tablet [id number] [place], [place] [phone] (Phone) [phone] (Fax

## 2018-07-01 DIAGNOSIS — H25013 Cortical age-related cataract, bilateral: Secondary | ICD-10-CM | POA: Diagnosis not present

## 2018-07-01 DIAGNOSIS — H40033 Anatomical narrow angle, bilateral: Secondary | ICD-10-CM | POA: Diagnosis not present

## 2018-07-01 DIAGNOSIS — H2513 Age-related nuclear cataract, bilateral: Secondary | ICD-10-CM | POA: Diagnosis not present

## 2018-07-01 DIAGNOSIS — H43811 Vitreous degeneration, right eye: Secondary | ICD-10-CM | POA: Diagnosis not present

## 2018-07-05 DIAGNOSIS — H40013 Open angle with borderline findings, low risk, bilateral: Secondary | ICD-10-CM | POA: Diagnosis not present

## 2018-07-05 DIAGNOSIS — H25013 Cortical age-related cataract, bilateral: Secondary | ICD-10-CM | POA: Diagnosis not present

## 2018-07-05 DIAGNOSIS — H2513 Age-related nuclear cataract, bilateral: Secondary | ICD-10-CM | POA: Diagnosis not present

## 2018-07-05 DIAGNOSIS — H18413 Arcus senilis, bilateral: Secondary | ICD-10-CM | POA: Diagnosis not present

## 2018-07-05 DIAGNOSIS — H25043 Posterior subcapsular polar age-related cataract, bilateral: Secondary | ICD-10-CM | POA: Diagnosis not present

## 2018-07-05 DIAGNOSIS — H2512 Age-related nuclear cataract, left eye: Secondary | ICD-10-CM | POA: Diagnosis not present

## 2018-07-18 ENCOUNTER — Other Ambulatory Visit: Payer: Self-pay | Admitting: Family Medicine

## 2018-07-18 NOTE — Telephone Encounter (Signed)
Forwarding medication refill to provider for review. 

## 2018-07-23 ENCOUNTER — Telehealth: Payer: Self-pay | Admitting: Hematology and Oncology

## 2018-07-23 NOTE — Telephone Encounter (Signed)
Scheduled appt per 6/29 sch message - pt daughter aware of appt date and time

## 2018-07-26 ENCOUNTER — Other Ambulatory Visit: Payer: Self-pay | Admitting: Family Medicine

## 2018-07-26 NOTE — Telephone Encounter (Signed)
Requested Prescriptions  Pending Prescriptions Disp Refills  . telmisartan (MICARDIS) 40 MG tablet [Pharmacy Med Name: TELMISARTAN 40 MG TABLET] 30 tablet 0    Sig: TAKE 1 TABLET BY MOUTH EVERY DAY     Cardiovascular:  Angiotensin Receptor Blockers Failed - 07/26/2018  1:36 PM      Failed - Cr in normal range and within 180 days    Creatinine  Date Value Ref Range Status  05/29/2016 1.7 (H) 0.6 - 1.1 mg/dL Final   Creatinine, Ser  Date Value Ref Range Status  05/30/2017 1.71 (H) 0.60 - 1.10 mg/dL Final         Failed - K in normal range and within 180 days    Potassium  Date Value Ref Range Status  05/30/2017 4.2 3.5 - 5.1 mmol/L Final  05/29/2016 4.4 3.5 - 5.1 mEq/L Final         Failed - Last BP in normal range    BP Readings from Last 1 Encounters:  11/01/17 140/64         Failed - Valid encounter within last 6 months    Recent Outpatient Visits          8 months ago Encounter for Medicare annual wellness exam   Primary Care at [facility], Zoe A, MD   11 months ago Anemia in stage 4 chronic kidney disease ([place])   Primary Care at [facility], Arlie Solomons, MD   1 year ago Establishing care with new doctor, encounter for   Primary Care at [facility], Arlie Solomons, MD      Future Appointments            In 4 days Forrest Moron, MD Primary Care at [place], [place] - Patient is not pregnant

## 2018-07-29 ENCOUNTER — Inpatient Hospital Stay: Payer: Medicare Other

## 2018-07-29 ENCOUNTER — Ambulatory Visit: Payer: Medicare Other | Admitting: Hematology and Oncology

## 2018-07-30 ENCOUNTER — Encounter: Payer: Self-pay | Admitting: Family Medicine

## 2018-07-30 ENCOUNTER — Ambulatory Visit (INDEPENDENT_AMBULATORY_CARE_PROVIDER_SITE_OTHER): Payer: Medicare Other | Admitting: Family Medicine

## 2018-07-30 ENCOUNTER — Other Ambulatory Visit: Payer: Self-pay

## 2018-07-30 VITALS — BP 140/88 | HR 72 | Temp 98.9°F | Resp 16 | Ht 61.0 in | Wt 166.0 lb

## 2018-07-30 DIAGNOSIS — N184 Chronic kidney disease, stage 4 (severe): Secondary | ICD-10-CM | POA: Diagnosis not present

## 2018-07-30 DIAGNOSIS — I129 Hypertensive chronic kidney disease with stage 1 through stage 4 chronic kidney disease, or unspecified chronic kidney disease: Secondary | ICD-10-CM

## 2018-07-30 DIAGNOSIS — E041 Nontoxic single thyroid nodule: Secondary | ICD-10-CM | POA: Diagnosis not present

## 2018-07-30 DIAGNOSIS — I10 Essential (primary) hypertension: Secondary | ICD-10-CM | POA: Diagnosis not present

## 2018-07-30 NOTE — Progress Notes (Signed)
Established Patient Office Visit  Subjective:  Patient ID: [name], female    DOB: 21-Jun-1935  Age: 83 y.o. MRN: [id number]  CC:  Chief Complaint  Patient presents with  . Hypertension    6 month follow-up     HPI [name] presents for   Hypertension: Patient here for follow-up of elevated blood pressure. She is exercising by gardening and is adherent to low salt diet.  Blood pressure is well controlled at home. Cardiac symptoms none. Patient denies chest pain, claudication, exertional chest pressure/discomfort, lower extremity edema, near-syncope and palpitations.  Cardiovascular risk factors: advanced age (older than 32 for men, 90 for women) and hypertension. Use of agents associated with hypertension: none. History of target organ damage: none. BP Readings from Last 3 Encounters:  07/30/18 140/88  11/01/17 140/64  08/30/17 (!) 160/73   Lab Results  Component Value Date   CREATININE 1.80 (H) 07/30/2018     Past Medical History:  Diagnosis Date  . Arthritis   . Benign hypertensive kidney disease with chronic kidney disease stage I through stage IV, or unspecified(403.10)    sees dr. Justin Mend again in april  . Cancer ([place])    lung ca dx'd 02/2013  . Chronic kidney disease, stage III (moderate) (HCC)   . Cough 02/26/2013  . GERD (gastroesophageal reflux disease)   . Headache   . Hypertension   . Hyperthyroidism    took iodine treatment for this  . Lung mass 02/27/2013  . Neurologic disorder    2009  . Shortness of breath    exertion  . Thrombocytopenia, unspecified ([place]) 02/26/2013  . Unspecified vitamin D deficiency     Past Surgical History:  Procedure Laterality Date  . BACK SURGERY    . EYE SURGERY     cataract  . EYE SURGERY Left    open up tear duct  . LYMPH NODE DISSECTION Left 04/07/2013   Procedure: LYMPH NODE DISSECTION;  Surgeon: [name], MD;  Location: [place];  Service: Thoracic;  Laterality: Left;  . NECK SURGERY    . PORT-A-CATH  REMOVAL Left 01/09/2014   Procedure: REMOVAL PORT-A-CATH;  Surgeon: [name], MD;  Location: [place];  Service: Thoracic;  Laterality: Left;  . PORTACATH PLACEMENT Left 05/06/2013   Procedure: INSERTION PORT-A-CATH;  Surgeon: [name], MD;  Location: [place];  Service: Thoracic;  Laterality: Left;  Marland Kitchen VIDEO ASSISTED THORACOSCOPY (VATS)/ LOBECTOMY Left 04/07/2013   Procedure: VIDEO ASSISTED THORACOSCOPY (VATS)/ LOBECTOMY; INSERTION OF ON-Q PUMP;  Surgeon: [name], MD;  Location: [place];  Service: Thoracic;  Laterality: Left;  Marland Kitchen VIDEO BRONCHOSCOPY N/A 04/07/2013   Procedure: VIDEO BRONCHOSCOPY;  Surgeon: [name], MD;  Location: [facility] OR;  Service: Thoracic;  Laterality: N/A;    Family History  Problem Relation Age of Onset  . Lung cancer Brother        smoked  . Breast cancer Mother   . Lung cancer Father        smoked  . Rectal cancer Neg Hx   . Stomach cancer Neg Hx   . Esophageal cancer Neg Hx   . Colon cancer Neg Hx     Social History   Socioeconomic History  . Marital status: Single    Spouse name: Not on file  . Number of children: 2  . Years of education: Not on file  . Highest education level: Not on file  Occupational History  . Not on file  Social Needs  . Financial resource strain: Not on file  . Food insecurity    Worry: Not on file    Inability: Not on file  . Transportation needs    Medical: Not on file    Non-medical: Not on file  Tobacco Use  . Smoking status: Former Smoker    Packs/day: 0.50    Years: 60.00    Pack years: 30.00    Types: Cigarettes    Quit date: 02/25/2012    Years since quitting: 6.5  . Smokeless tobacco: Never Used  Substance and Sexual Activity  . Alcohol use: No  . Drug use: No  . Sexual activity: Not on file  Lifestyle  . Physical activity    Days per week: Not on file    Minutes per session: Not on file  . Stress: Not on file  Relationships  . Social Herbalist on phone: Not on file     Gets together: Not on file    Attends religious service: Not on file    Active member of club or organization: Not on file    Attends meetings of clubs or organizations: Not on file    Relationship status: Not on file  . Intimate partner violence    Fear of current or ex partner: Not on file    Emotionally abused: Not on file    Physically abused: Not on file    Forced sexual activity: Not on file  Other Topics Concern  . Not on file  Social History Narrative  . Not on file    Outpatient Medications Prior to Visit  Medication Sig Dispense Refill  . acetaminophen (TYLENOL) 500 MG tablet Take 1 tablet (500 mg total) by mouth every 6 (six) hours as needed. 30 tablet 0  . atorvastatin (LIPITOR) 10 MG tablet TAKE 1 TABLET (10 MG TOTAL) BY MOUTH DAILY AT 6 PM. 90 tablet 3  . Cholecalciferol (VITAMIN D-3) 1000 units CAPS Take 1 capsule (1,000 Units total) by mouth daily. 90 capsule 1  . clopidogrel (PLAVIX) 75 MG tablet Take 1 tablet (75 mg total) by mouth daily with breakfast. 90 tablet 3  . magnesium oxide (MAG-OX) 400 MG tablet Take 1 tablet (400 mg total) by mouth daily. 90 tablet 1  . Multiple Vitamin (MULTIVITAMIN) tablet Take 1 tablet by mouth at bedtime.     . TRAVATAN Z 0.004 % SOLN ophthalmic solution Place 1 drop into both eyes daily. In affected eye 5 mL 6  . chlorthalidone (HYGROTON) 25 MG tablet TAKE 1 TABLET BY MOUTH EVERY DAY 90 tablet 0  . cyclobenzaprine (FLEXERIL) 10 MG tablet Take 1 tablet (10 mg total) by mouth 2 (two) times daily as needed for muscle spasms. 90 tablet 1  . metoprolol succinate (TOPROL-XL) 25 MG 24 hr tablet TAKE 1 TABLET (25MG) BY ORAL ROUTE TWICE A DAY 180 tablet 1  . telmisartan (MICARDIS) 40 MG tablet TAKE 1 TABLET BY MOUTH EVERY DAY 30 tablet 0   No facility-administered medications prior to visit.     Allergies  Allergen Reactions  . Aspirin Other (See Comments)    REACTION: stomach upset  . Lactose Intolerance (Gi) Diarrhea and Other (See  Comments)    "can't drink milk. It tears my stomach up."  . Adhesive [Tape] Rash  . Lisinopril Other (See Comments)    cough  . Percocet [Oxycodone-Acetaminophen] Itching    ROS Review of Systems Review of Systems  Constitutional: Negative for activity change,  appetite change, chills and fever.  HENT: Negative for congestion, nosebleeds, trouble swallowing and voice change.   Respiratory: Negative for cough, shortness of breath and wheezing.   Gastrointestinal: Negative for diarrhea, nausea and vomiting.  Genitourinary: Negative for difficulty urinating, dysuria, flank pain and hematuria.  Musculoskeletal: Negative for back pain, joint swelling and neck pain.  Neurological: Negative for dizziness, speech difficulty, light-headedness and numbness.  See HPI. All other review of systems negative.     Objective:    Physical Exam  BP 140/88   Pulse 72   Temp 98.9 F (37.2 C) (Oral)   Resp 16   Ht '5\' 1"'$  (1.549 m)   Wt 166 lb (75.3 kg)   SpO2 95%   BMI 31.37 kg/m  Wt Readings from Last 3 Encounters:  07/30/18 166 lb (75.3 kg)  11/01/17 167 lb 6.4 oz (75.9 kg)  08/30/17 172 lb (78 kg)   Physical Exam  Constitutional: Oriented to person, place, and time. Appears well-developed and well-nourished.  HENT:  Head: Normocephalic and atraumatic.  Eyes: Conjunctivae and EOM are normal.  Cardiovascular: Normal rate, regular rhythm, normal heart sounds and intact distal pulses.  No murmur heard. Pulmonary/Chest: Effort normal and breath sounds normal. No stridor. No respiratory distress. Has no wheezes.  Neurological: Is alert and oriented to person, place, and time.  Skin: Skin is warm. Capillary refill takes less than 2 seconds.  Psychiatric: Has a normal mood and affect. Behavior is normal. Judgment and thought content normal.    Health Maintenance Due  Topic Date Due  . TETANUS/TDAP  05/30/1956  . DEXA SCAN  05/31/2002  . PNA vac Low Risk Adult (2 of 2 - PCV13) 04/12/2014     There are no preventive care reminders to display for this patient.  Lab Results  Component Value Date   TSH 1.810 07/30/2018   Lab Results  Component Value Date   WBC 5.8 07/30/2018   HGB 12.7 07/30/2018   HCT 35.6 07/30/2018   MCV 90 07/30/2018   PLT 251 07/30/2018   Lab Results  Component Value Date   NA 144 07/30/2018   K 4.2 07/30/2018   CHLORIDE 112 (H) 05/29/2016   CO2 21 07/30/2018   GLUCOSE 100 (H) 07/30/2018   BUN 21 07/30/2018   CREATININE 1.80 (H) 07/30/2018   BILITOT 0.3 07/30/2018   ALKPHOS 70 07/30/2018   AST 20 07/30/2018   ALT 15 07/30/2018   PROT 7.1 07/30/2018   ALBUMIN 4.6 07/30/2018   CALCIUM 10.1 07/30/2018   ANIONGAP 6 05/30/2017   EGFR 32 (L) 05/29/2016   Lab Results  Component Value Date   CHOL 150 07/30/2018   Lab Results  Component Value Date   HDL 47 07/30/2018   Lab Results  Component Value Date   LDLCALC 85 07/30/2018   Lab Results  Component Value Date   TRIG 88 07/30/2018   Lab Results  Component Value Date   CHOLHDL 3.2 07/30/2018   No results found for: HGBA1C    Assessment & Plan:   Problem List Items Addressed This Visit      Cardiovascular and Mediastinum   Essential hypertension, benign - Primary   Benign hypertension with CKD (chronic kidney disease) stage IV ([place])  Will monitor with labs Discussed that she should continue with Nephrology Avoid nsaids    Relevant Orders   CMP14+EGFR (Completed)   Lipid panel (Completed)   CBC (Completed)     Endocrine   Left thyroid nodule  -  historical  Will check tsh   Relevant Orders   TSH (Completed)      No orders of the defined types were placed in this encounter.   Follow-up: Return in about 6 months (around 01/30/2019) for follow up for hypertension.    Forrest Moron, MD

## 2018-07-30 NOTE — Patient Instructions (Addendum)
If you have lab work done today you will be contacted with your lab results within the next 2 weeks.  If you have not heard from [place] then please contact us. The fastest way to get your results is to register for My Chart.   IF you received an x-ray today, you will receive an invoice from [facility]. Please contact [facility] at [phone] with questions or concerns regarding your invoice.   IF you received labwork today, you will receive an invoice from [facility]. Please contact [facility] at [phone] with questions or concerns regarding your invoice.   Our billing staff will not be able to assist you with questions regarding bills from these companies.  You will be contacted with the lab results as soon as they are available. The fastest way to get your results is to activate your My Chart account. Instructions are located on the last page of this paperwork. If you have not heard from [place] regarding the results in 2 weeks, please contact this office.      Chronic Kidney Disease, Adult Chronic kidney disease (CKD) occurs when the kidneys become damaged slowly over a long period of time. The kidneys are a pair of organs that do many important jobs in the body, including:  Removing waste and extra fluid from the blood to make urine.  Making hormones that maintain the amount of fluid in tissues and blood vessels.  Maintaining the right amount of fluids and chemicals in the body. A small amount of kidney damage may not cause problems, but a large amount of damage may make it hard or impossible for the kidneys to work the way they should. If steps are not taken to slow down kidney damage or to stop it from getting worse, the kidneys may stop working permanently (end-stage renal disease or ESRD). Most of the time, CKD does not go away, but it can often be controlled. People who have CKD are usually able to live normal lives. What are the causes? The most common causes of  this condition are diabetes and high blood pressure (hypertension). Other causes include:  Heart and blood vessel (cardiovascular) disease.  Kidney diseases, such as: ? Glomerulonephritis. ? Interstitial nephritis. ? Polycystic kidney disease. ? Renal vascular disease.  Diseases that affect the immune system.  Genetic diseases.  Medicines that damage the kidneys, such as anti-inflammatory medicines.  Being around or being in contact with poisonous (toxic) substances.  A kidney or urinary infection that occurs again and again (recurs).  Vasculitis. This is swelling or inflammation of the blood vessels.  A problem with urine flow that may be caused by: ? Cancer. ? Having kidney stones more than one time. ? An enlarged prostate, in males. What increases the risk? You are more likely to develop this condition if you:  Are older than age 25.  Are female.  Are African-American, Hispanic, Asian, [place], or American [place].  Are a current or former smoker.  Are obese.  Have a family history of kidney disease or failure.  Often take medicines that are damaging to the kidneys. What are the signs or symptoms? Symptoms of this condition include:  Swelling (edema) of the face, legs, ankles, or feet.  Tiredness (lethargy) and having less energy.  Nausea or vomiting.  Confusion or trouble concentrating.  Problems with urination, such as: ? Painful or burning feeling during urination. ? Decreased urine production. ? Frequent urination, especially at night. ? Bloody urine.  Muscle twitches  and cramps, especially in the legs.  Shortness of breath.  Weakness.  Loss of appetite.  Metallic taste in the mouth.  Trouble sleeping.  Dry, itchy skin.  A low blood count (anemia).  Pale lining of the eyelids and surface of the eye (conjunctiva). Symptoms develop slowly and may not be obvious until the kidney damage becomes severe. It is possible to have  kidney disease for years without having any symptoms. How is this diagnosed? This condition may be diagnosed based on:  Blood tests.  Urine tests.  Imaging tests, such as an ultrasound or CT scan.  A test in which a sample of tissue is removed from the kidneys to be examined under a microscope (kidney biopsy). These test results will help your health care provider determine how serious the CKD is. How is this treated? There is no cure for most cases of this condition, but treatment usually relieves symptoms and prevents or slows the progression of the disease. Treatment may include:  Making diet changes, which may require you to avoid alcohol, salty foods (sodium), and foods that are high in potassium, calcium, and protein.  Medicines: ? To lower blood pressure. ? To control blood glucose. ? To relieve anemia. ? To relieve swelling. ? To protect your bones. ? To improve the balance of electrolytes in your blood.  Removing toxic waste from the body through types of dialysis, if the kidneys can no longer do their job (kidney failure).  Managing any other conditions that are causing your CKD or making it worse. Follow these instructions at home: Medicines  Take over-the-counter and prescription medicines only as told by your health care provider. The dose of some medicines that you take may need to be adjusted.  Do not take any new medicines unless approved by your health care provider. Many medicines can worsen your kidney damage.  Do not take any vitamin and mineral supplements unless approved by your health care provider. Many nutritional supplements can worsen your kidney damage. General instructions  Follow your prescribed diet as told by your health care provider.  Do not use any products that contain nicotine or tobacco, such as cigarettes and e-cigarettes. If you need help quitting, ask your health care provider.  Monitor and track your blood pressure at home. Report  changes in your blood pressure as told by your health care provider.  If you are being treated for diabetes, monitor and track your blood sugar (blood glucose) levels as told by your health care provider.  Maintain a healthy weight. If you need help with this, ask your health care provider.  Start or continue an exercise plan. Exercise at least 30 minutes a day, 5 days a week.  Keep your immunizations up to date as told by your health care provider.  Keep all follow-up visits as told by your health care provider. This is important. Where to find more information  American Association of Kidney Patients: BombTimer.gl  National Kidney Foundation: www.kidney.[place]: https://mathis.com/  Life Options Rehabilitation Program: www.lifeoptions.org and www.kidneyschool.org Contact a health care provider if:  Your symptoms get worse.  You develop new symptoms. Get help right away if:  You develop symptoms of ESRD, which include: ? Headaches. ? Numbness in the hands or feet. ? Easy bruising. ? Frequent hiccups. ? Chest pain. ? Shortness of breath. ? Lack of menstruation, in women.  You have a fever.  You have decreased urine production.  You have pain or bleeding when you urinate.  Summary  Chronic kidney disease (CKD) occurs when the kidneys become damaged slowly over a long period of time.  The most common causes of this condition are diabetes and high blood pressure (hypertension).  There is no cure for most cases of this condition, but treatment usually relieves symptoms and prevents or slows the progression of the disease. Treatment may include a combination of medicines and lifestyle changes. This information is not intended to replace advice given to you by your health care provider. Make sure you discuss any questions you have with your health care provider. Document Released: 10/19/2007 Document Revised: 12/22/2016 Document Reviewed: 02/17/2016 Elsevier  Patient Education  2020 [employer].

## 2018-07-31 LAB — CMP14+EGFR
ALT: 15 IU/L (ref 0–32)
AST: 20 IU/L (ref 0–40)
Albumin/Globulin Ratio: 1.8 (ref 1.2–2.2)
Albumin: 4.6 g/dL (ref 3.6–4.6)
Alkaline Phosphatase: 70 IU/L (ref 39–117)
BUN/Creatinine Ratio: 12 (ref 12–28)
BUN: 21 mg/dL (ref 8–27)
Bilirubin Total: 0.3 mg/dL (ref 0.0–1.2)
CO2: 21 mmol/L (ref 20–29)
Calcium: 10.1 mg/dL (ref 8.7–10.3)
Chloride: 106 mmol/L (ref 96–106)
Creatinine, Ser: 1.8 mg/dL — ABNORMAL HIGH (ref 0.57–1.00)
GFR calc Af Amer: 30 mL/min/{1.73_m2} — ABNORMAL LOW (ref >59)
GFR calc non Af Amer: 26 mL/min/{1.73_m2} — ABNORMAL LOW (ref >59)
Globulin, Total: 2.5 g/dL (ref 1.5–4.5)
Glucose: 100 mg/dL — ABNORMAL HIGH (ref 65–99)
Potassium: 4.2 mmol/L (ref 3.5–5.2)
Sodium: 144 mmol/L (ref 134–144)
Total Protein: 7.1 g/dL (ref 6.0–8.5)

## 2018-07-31 LAB — CBC
Hematocrit: 35.6 % (ref 34.0–46.6)
Hemoglobin: 12.7 g/dL (ref 11.1–15.9)
MCH: 32.2 pg (ref 26.6–33.0)
MCHC: 35.7 g/dL (ref 31.5–35.7)
MCV: 90 fL (ref 79–97)
Platelets: 251 10*3/uL (ref 150–450)
RBC: 3.95 x10E6/uL (ref 3.77–5.28)
RDW: 12.6 % (ref 11.7–15.4)
WBC: 5.8 10*3/uL (ref 3.4–10.8)

## 2018-07-31 LAB — LIPID PANEL
Chol/HDL Ratio: 3.2 ratio (ref 0.0–4.4)
Cholesterol, Total: 150 mg/dL (ref 100–199)
HDL: 47 mg/dL (ref >39)
LDL Calculated: 85 mg/dL (ref 0–99)
Triglycerides: 88 mg/dL (ref 0–149)
VLDL Cholesterol Cal: 18 mg/dL (ref 5–40)

## 2018-07-31 LAB — TSH: TSH: 1.81 u[IU]/mL (ref 0.450–4.500)

## 2018-08-08 DIAGNOSIS — H25012 Cortical age-related cataract, left eye: Secondary | ICD-10-CM | POA: Diagnosis not present

## 2018-08-08 DIAGNOSIS — H2512 Age-related nuclear cataract, left eye: Secondary | ICD-10-CM | POA: Diagnosis not present

## 2018-08-09 DIAGNOSIS — H2511 Age-related nuclear cataract, right eye: Secondary | ICD-10-CM | POA: Diagnosis not present

## 2018-08-09 DIAGNOSIS — H2512 Age-related nuclear cataract, left eye: Secondary | ICD-10-CM | POA: Diagnosis not present

## 2018-08-22 DIAGNOSIS — H25011 Cortical age-related cataract, right eye: Secondary | ICD-10-CM | POA: Diagnosis not present

## 2018-08-22 DIAGNOSIS — H2511 Age-related nuclear cataract, right eye: Secondary | ICD-10-CM | POA: Diagnosis not present

## 2018-08-23 ENCOUNTER — Other Ambulatory Visit: Payer: Self-pay | Admitting: Family Medicine

## 2018-08-23 DIAGNOSIS — H2511 Age-related nuclear cataract, right eye: Secondary | ICD-10-CM | POA: Diagnosis not present

## 2018-08-23 NOTE — Telephone Encounter (Signed)
Requested Prescriptions  Pending Prescriptions Disp Refills  . telmisartan (MICARDIS) 40 MG tablet [Pharmacy Med Name: TELMISARTAN 40 MG TABLET] 90 tablet 1    Sig: TAKE 1 TABLET BY MOUTH EVERY DAY     Cardiovascular:  Angiotensin Receptor Blockers Failed - 08/23/2018 11:33 AM      Failed - Cr in normal range and within 180 days    Creatinine  Date Value Ref Range Status  05/29/2016 1.7 (H) 0.6 - 1.1 mg/dL Final   Creatinine, Ser  Date Value Ref Range Status  07/30/2018 1.80 (H) 0.57 - 1.00 mg/dL Final         Failed - Last BP in normal range    BP Readings from Last 1 Encounters:  07/30/18 140/88         Passed - K in normal range and within 180 days    Potassium  Date Value Ref Range Status  07/30/2018 4.2 3.5 - 5.2 mmol/L Final  05/29/2016 4.4 3.5 - 5.1 mEq/L Final         Passed - Patient is not pregnant      Passed - Valid encounter within last 6 months    Recent Outpatient Visits          3 weeks ago Essential hypertension, benign   Primary Care at [facility], Arlie Solomons, MD   9 months ago Encounter for [employer] annual wellness exam   Primary Care at [facility], Zoe A, MD   1 year ago Anemia in stage 4 chronic kidney disease ([place])   Primary Care at [facility], Arlie Solomons, MD   1 year ago Establishing care with new doctor, encounter for   Primary Care at [facility], Arlie Solomons, MD      Future Appointments            In 5 months Forrest Moron, MD Primary Care at [place], [facility]

## 2018-09-06 ENCOUNTER — Other Ambulatory Visit: Payer: Self-pay | Admitting: Family Medicine

## 2018-09-12 ENCOUNTER — Ambulatory Visit: Payer: Medicare Other | Admitting: Cardiothoracic Surgery

## 2018-09-17 ENCOUNTER — Telehealth: Payer: Self-pay | Admitting: Internal Medicine

## 2018-09-23 NOTE — Telephone Encounter (Signed)
Patient checking on the status of metoprolol succinate (TOPROL-XL) 25 MG 24 hr tablet request, informed patient please allow 48 to 72 hour turn around time.  [id number] [place], [address] [place] [phone] (Phone) [phone] (Fax)

## 2018-09-24 ENCOUNTER — Other Ambulatory Visit: Payer: Self-pay | Admitting: *Deleted

## 2018-09-24 DIAGNOSIS — H43812 Vitreous degeneration, left eye: Secondary | ICD-10-CM | POA: Diagnosis not present

## 2018-09-24 MED ORDER — METOPROLOL SUCCINATE ER 25 MG PO TB24: ORAL_TABLET | ORAL | 1 refills | Status: DC

## 2018-09-24 NOTE — Telephone Encounter (Signed)
Prescription sent

## 2018-09-26 ENCOUNTER — Encounter: Payer: Self-pay | Admitting: Family Medicine

## 2018-10-04 DIAGNOSIS — N183 Chronic kidney disease, stage 3 (moderate): Secondary | ICD-10-CM | POA: Diagnosis not present

## 2018-10-04 DIAGNOSIS — D631 Anemia in chronic kidney disease: Secondary | ICD-10-CM | POA: Diagnosis not present

## 2018-10-04 DIAGNOSIS — I129 Hypertensive chronic kidney disease with stage 1 through stage 4 chronic kidney disease, or unspecified chronic kidney disease: Secondary | ICD-10-CM | POA: Diagnosis not present

## 2018-10-04 DIAGNOSIS — Z23 Encounter for immunization: Secondary | ICD-10-CM | POA: Diagnosis not present

## 2018-10-04 DIAGNOSIS — N2581 Secondary hyperparathyroidism of renal origin: Secondary | ICD-10-CM | POA: Diagnosis not present

## 2018-10-04 DIAGNOSIS — N189 Chronic kidney disease, unspecified: Secondary | ICD-10-CM | POA: Diagnosis not present

## 2018-10-23 ENCOUNTER — Other Ambulatory Visit: Payer: Self-pay | Admitting: Family Medicine

## 2018-11-01 ENCOUNTER — Telehealth: Payer: Self-pay

## 2018-11-01 ENCOUNTER — Other Ambulatory Visit: Payer: Self-pay | Admitting: Hematology and Oncology

## 2018-11-01 DIAGNOSIS — Z85118 Personal history of other malignant neoplasm of bronchus and lung: Secondary | ICD-10-CM

## 2018-11-01 NOTE — Telephone Encounter (Signed)
Called and given below message. She verbalized understanding and will go for CXR Monday at 1030.

## 2018-11-01 NOTE — Telephone Encounter (Signed)
-----   Message from Heath Lark, MD sent at 11/01/2018 10:31 AM EDT ----- Regarding: appt on Monday Can she go to [facility] today or go early morning Monday around 1030 to get CXR done before I see her?

## 2018-11-04 ENCOUNTER — Ambulatory Visit (HOSPITAL_COMMUNITY)
Admission: RE | Admit: 2018-11-04 | Discharge: 2018-11-04 | Disposition: A | Discharge status: Home / Self Care | Payer: Medicare Other | Source: Ambulatory Visit | Attending: Hematology and Oncology | Admitting: Hematology and Oncology

## 2018-11-04 ENCOUNTER — Other Ambulatory Visit: Payer: Self-pay

## 2018-11-04 ENCOUNTER — Inpatient Hospital Stay: Payer: Medicare Other | Attending: Hematology and Oncology | Admitting: Hematology and Oncology

## 2018-11-04 ENCOUNTER — Ambulatory Visit: Payer: Self-pay

## 2018-11-04 ENCOUNTER — Inpatient Hospital Stay: Payer: Medicare Other

## 2018-11-04 ENCOUNTER — Encounter: Payer: Self-pay | Admitting: Hematology and Oncology

## 2018-11-04 VITALS — BP 161/65 | HR 60 | Temp 97.8°F | Resp 18 | Ht 61.0 in | Wt 167.6 lb

## 2018-11-04 DIAGNOSIS — C3490 Malignant neoplasm of unspecified part of unspecified bronchus or lung: Secondary | ICD-10-CM | POA: Diagnosis not present

## 2018-11-04 DIAGNOSIS — Z85118 Personal history of other malignant neoplasm of bronchus and lung: Secondary | ICD-10-CM

## 2018-11-04 DIAGNOSIS — I10 Essential (primary) hypertension: Secondary | ICD-10-CM | POA: Diagnosis not present

## 2018-11-04 DIAGNOSIS — J439 Emphysema, unspecified: Secondary | ICD-10-CM | POA: Diagnosis not present

## 2018-11-04 DIAGNOSIS — N184 Chronic kidney disease, stage 4 (severe): Secondary | ICD-10-CM

## 2018-11-04 DIAGNOSIS — D631 Anemia in chronic kidney disease: Secondary | ICD-10-CM

## 2018-11-04 DIAGNOSIS — I129 Hypertensive chronic kidney disease with stage 1 through stage 4 chronic kidney disease, or unspecified chronic kidney disease: Secondary | ICD-10-CM

## 2018-11-04 LAB — CBC WITH DIFFERENTIAL/PLATELET
Abs Immature Granulocytes: 0.01 10*3/uL (ref 0.00–0.07)
Basophils Absolute: 0.1 10*3/uL (ref 0.0–0.1)
Basophils Relative: 1 %
Eosinophils Absolute: 0.5 10*3/uL (ref 0.0–0.5)
Eosinophils Relative: 10 %
HCT: 34.7 % — ABNORMAL LOW (ref 36.0–46.0)
Hemoglobin: 11.3 g/dL — ABNORMAL LOW (ref 12.0–15.0)
Immature Granulocytes: 0 %
Lymphocytes Relative: 25 %
Lymphs Abs: 1.3 10*3/uL (ref 0.7–4.0)
MCH: 31.4 pg (ref 26.0–34.0)
MCHC: 32.6 g/dL (ref 30.0–36.0)
MCV: 96.4 fL (ref 80.0–100.0)
Monocytes Absolute: 0.6 10*3/uL (ref 0.1–1.0)
Monocytes Relative: 12 %
Neutro Abs: 2.7 10*3/uL (ref 1.7–7.7)
Neutrophils Relative %: 52 %
Platelets: 236 10*3/uL (ref 150–400)
RBC: 3.6 MIL/uL — ABNORMAL LOW (ref 3.87–5.11)
RDW: 14.6 % (ref 11.5–15.5)
WBC: 5.2 10*3/uL (ref 4.0–10.5)
nRBC: 0 % (ref 0.0–0.2)

## 2018-11-04 LAB — COMPREHENSIVE METABOLIC PANEL
ALT: 17 U/L (ref 0–44)
AST: 22 U/L (ref 15–41)
Albumin: 3.7 g/dL (ref 3.5–5.0)
Alkaline Phosphatase: 67 U/L (ref 38–126)
Anion gap: 5 (ref 5–15)
BUN: 25 mg/dL — ABNORMAL HIGH (ref 8–23)
CO2: 25 mmol/L (ref 22–32)
Calcium: 8.7 mg/dL — ABNORMAL LOW (ref 8.9–10.3)
Chloride: 111 mmol/L (ref 98–111)
Creatinine, Ser: 1.73 mg/dL — ABNORMAL HIGH (ref 0.44–1.00)
GFR calc Af Amer: 32 mL/min — ABNORMAL LOW (ref >60)
GFR calc non Af Amer: 27 mL/min — ABNORMAL LOW (ref >60)
Glucose, Bld: 94 mg/dL (ref 70–99)
Potassium: 4.5 mmol/L (ref 3.5–5.1)
Sodium: 141 mmol/L (ref 135–145)
Total Bilirubin: 0.3 mg/dL (ref 0.3–1.2)
Total Protein: 6.7 g/dL (ref 6.5–8.1)

## 2018-11-05 NOTE — Assessment & Plan Note (Signed)
She has stable chronic kidney disease stage IV She will continue risk factor modification Her blood pressure is grossly elevated during the clinic visit, likely exacerbated by anxiety According to the patient, her blood pressure monitoring at home is satisfactory

## 2018-11-05 NOTE — Progress Notes (Signed)
[name] OFF[facility] PROGRESS NOTE  Patient Care Team: Forrest Moron, MD as PCP - General (Internal Medicine) Heath Lark, MD as Consulting Physician (Hematology and Oncology) [name], MD as Consulting Physician (Cardiothoracic Surgery)  ASSESSMENT & PLAN:  History of lung cancer Clinically, she have no signs to suggest cancer recurrence I have reviewed the chest x-ray myself which looks benign However, his CT chest from last year show lung nodules Based on current guidelines, I recommend 1 more CT scan of the chest without contrast to complete the evaluation If the CT scan is negative, we will discharge her from the clinic She is up-to-date with influenza vaccination  Anemia in chronic kidney disease This is likely anemia of chronic disease. The patient denies recent history of bleeding such as epistaxis, hematuria or hematochezia. She is asymptomatic from the anemia. We will observe for now.    Benign hypertension with CKD (chronic kidney disease) stage IV (HCC) She has chronic kidney disease since she has last received treatment She will continue close monitoring and risk factor modifications  Essential hypertension, benign She has stable chronic kidney disease stage IV She will continue risk factor modification Her blood pressure is grossly elevated during the clinic visit, likely exacerbated by anxiety According to the patient, her blood pressure monitoring at home is satisfactory   Orders Placed This Encounter  Procedures  . CT Chest Wo Contrast    Standing Status:   Future    Standing Expiration Date:   11/04/2019    Order Specific Question:   ** REASON FOR EXAM (FREE TEXT)    Answer:   Hx lung cancer ro recurrence    Order Specific Question:   Preferred imaging location?    Answer:   [facility]    Order Specific Question:   Radiology Contrast Protocol - do NOT remove file path    Answer:    [email or web address]    INTERVAL HISTORY: Please see below for problem oriented charting. She returns for further follow-up Her daughter is also present to collaborate the history due to the patient's poor hearing She denies recent cough, chest pain or shortness of breath She is up-to-date with influenza vaccination According to the patient, her blood pressure monitoring at home is satisfactory SUMMARY OF ONCOLOGIC HISTORY: Oncology History Overview Note  Non-small cell lung cancer, adenocarcinoma   Primary site: Lung (Left)   Staging method: AJCC 7th Edition   Clinical: Stage IB (T2a, N0, M0) signed by Heath Lark, MD on 04/29/2013  9:01 PM   Pathologic: Stage IB (T2a, N0, cM0) signed by [name], MD on 04/09/2013 12:51 PM   Summary: Stage IB (T2a, N0, cM0)     History of lung cancer  02/26/2013 Imaging   CXR for evaluation of cough showed new left lung mass   02/28/2013 Imaging   CT chest showed left new lung nodule   03/14/2013 Imaging   PET/CT scan showed localized disease in the left lung   04/07/2013 Surgery   She underwent bronchoscopy and left lower lobe resection and lymph node sampling with negative margins   05/06/2013 Procedure   The patient has placement of Infuse-a-Port   05/21/2013 - 07/30/2013 Chemotherapy   She completed 4 cycles of adjuvant chemotherapy with navelbine and cisplatin   07/01/2013 Imaging   CT scan of the chest shows stable appearance.   07/02/2013 Adverse Reaction   Dose of chemotherapy was adjusted further due to severe anemia.   12/31/2013 Imaging  CT scan of the chest show no evidence of disease recurrence   07/03/2014 Imaging   Chest x-ray looks normal   12/31/2014 Imaging   Ct chest showed no evidence of recurrence   05/31/2015 Imaging   Status post resection of a left lower lobe lung lesion without findings for recurrent tumor or metastatic disease.    05/29/2016 Imaging   1. Stable exam. Status post left  lower lobectomy without evidence for new or progressive findings. 2. Emphysema. 3. Thoracic aortic atherosclerosis   05/30/2017 Imaging   1. Slight interval increase in size of right upper lobe nodule. Recommend attention on follow-up. 2. Stable appearance of the left hemithorax with left lower lobectomy. 3. Aortic Atherosclerosis (ICD10-I70.0) and Emphysema (ICD10-J43.9).     REVIEW OF SYSTEMS:   Constitutional: Denies fevers, chills or abnormal weight loss Eyes: Denies blurriness of vision Ears, nose, mouth, throat, and face: Denies mucositis or sore throat Respiratory: Denies cough, dyspnea or wheezes Cardiovascular: Denies palpitation, chest discomfort or lower extremity swelling Gastrointestinal:  Denies nausea, heartburn or change in bowel habits Skin: Denies abnormal skin rashes Lymphatics: Denies new lymphadenopathy or easy bruising Neurological:Denies numbness, tingling or new weaknesses Behavioral/Psych: Mood is stable, no new changes  All other systems were reviewed with the patient and are negative.  I have reviewed the past medical history, past surgical history, social history and family history with the patient and they are unchanged from previous note.  ALLERGIES:  is allergic to aspirin; lactose intolerance (gi); adhesive [tape]; lisinopril; and percocet [oxycodone-acetaminophen].  MEDICATIONS:  Current Outpatient Medications  Medication Sig Dispense Refill  . acetaminophen (TYLENOL) 500 MG tablet Take 1 tablet (500 mg total) by mouth every 6 (six) hours as needed. 30 tablet 0  . atorvastatin (LIPITOR) 10 MG tablet TAKE 1 TABLET (10 MG TOTAL) BY MOUTH DAILY AT 6 PM. 90 tablet 3  . chlorthalidone (HYGROTON) 25 MG tablet TAKE 1 TABLET BY MOUTH EVERY DAY 90 tablet 0  . Cholecalciferol (VITAMIN D-3) 1000 units CAPS Take 1 capsule (1,000 Units total) by mouth daily. 90 capsule 1  . clopidogrel (PLAVIX) 75 MG tablet TAKE 1 TABLET (75 MG TOTAL) BY MOUTH DAILY WITH  BREAKFAST. 90 tablet 3  . magnesium oxide (MAG-OX) 400 MG tablet Take 1 tablet (400 mg total) by mouth daily. 90 tablet 1  . metoprolol succinate (TOPROL-XL) 25 MG 24 hr tablet TAKE 1 TABLET (25MG ) BY ORAL ROUTE TWICE A DAY 180 tablet 1  . Multiple Vitamin (MULTIVITAMIN) tablet Take 1 tablet by mouth at bedtime.     Marland Kitchen telmisartan (MICARDIS) 40 MG tablet TAKE 1 TABLET BY MOUTH EVERY DAY 90 tablet 1   No current facility-administered medications for this visit.     PHYSICAL EXAMINATION: ECOG PERFORMANCE STATUS: 1 - Symptomatic but completely ambulatory  Vitals:   11/04/18 1141  BP: (!) 161/65  Pulse: 60  Resp: 18  Temp: 97.8 F (36.6 C)  SpO2: 99%   Filed Weights   11/04/18 1141  Weight: 167 lb 9.6 oz (76 kg)    GENERAL:alert, no distress and comfortable SKIN: skin color, texture, turgor are normal, no rashes or significant lesions EYES: normal, Conjunctiva are pink and non-injected, sclera clear OROPHARYNX:no exudate, no erythema and lips, buccal mucosa, and tongue normal  NECK: supple, thyroid normal size, non-tender, without nodularity LYMPH:  no palpable lymphadenopathy in the cervical, axillary or inguinal LUNGS: clear to auscultation and percussion with normal breathing effort HEART: regular rate & rhythm and no murmurs and no  lower extremity edema ABDOMEN:abdomen soft, non-tender and normal bowel sounds Musculoskeletal:no cyanosis of digits and no clubbing  NEURO: alert & oriented x 3 with fluent speech, no focal motor/sensory deficits  LABORATORY DATA:  I have reviewed the data as listed    Component Value Date/Time   NA 141 11/04/2018 1120   NA 144 07/30/2018 1217   NA 144 05/29/2016 1119   K 4.5 11/04/2018 1120   K 4.4 05/29/2016 1119   CL 111 11/04/2018 1120   CO2 25 11/04/2018 1120   CO2 23 05/29/2016 1119   GLUCOSE 94 11/04/2018 1120   GLUCOSE 91 05/29/2016 1119   BUN 25 (H) 11/04/2018 1120   BUN 21 07/30/2018 1217   BUN 20.8 05/29/2016 1119    CREATININE 1.73 (H) 11/04/2018 1120   CREATININE 1.7 (H) 05/29/2016 1119   CALCIUM 8.7 (L) 11/04/2018 1120   CALCIUM 9.5 05/29/2016 1119   PROT 6.7 11/04/2018 1120   PROT 7.1 07/30/2018 1217   PROT 6.8 05/29/2016 1119   ALBUMIN 3.7 11/04/2018 1120   ALBUMIN 4.6 07/30/2018 1217   ALBUMIN 3.7 05/29/2016 1119   AST 22 11/04/2018 1120   AST 28 05/29/2016 1119   ALT 17 11/04/2018 1120   ALT 29 05/29/2016 1119   ALKPHOS 67 11/04/2018 1120   ALKPHOS 68 05/29/2016 1119   BILITOT 0.3 11/04/2018 1120   BILITOT 0.3 07/30/2018 1217   BILITOT 0.24 05/29/2016 1119   GFRNONAA 27 (L) 11/04/2018 1120   GFRAA 32 (L) 11/04/2018 1120    No results found for: SPEP, UPEP  Lab Results  Component Value Date   WBC 5.2 11/04/2018   NEUTROABS 2.7 11/04/2018   HGB 11.3 (L) 11/04/2018   HCT 34.7 (L) 11/04/2018   MCV 96.4 11/04/2018   PLT 236 11/04/2018      Chemistry      Component Value Date/Time   NA 141 11/04/2018 1120   NA 144 07/30/2018 1217   NA 144 05/29/2016 1119   K 4.5 11/04/2018 1120   K 4.4 05/29/2016 1119   CL 111 11/04/2018 1120   CO2 25 11/04/2018 1120   CO2 23 05/29/2016 1119   BUN 25 (H) 11/04/2018 1120   BUN 21 07/30/2018 1217   BUN 20.8 05/29/2016 1119   CREATININE 1.73 (H) 11/04/2018 1120   CREATININE 1.7 (H) 05/29/2016 1119      Component Value Date/Time   CALCIUM 8.7 (L) 11/04/2018 1120   CALCIUM 9.5 05/29/2016 1119   ALKPHOS 67 11/04/2018 1120   ALKPHOS 68 05/29/2016 1119   AST 22 11/04/2018 1120   AST 28 05/29/2016 1119   ALT 17 11/04/2018 1120   ALT 29 05/29/2016 1119   BILITOT 0.3 11/04/2018 1120   BILITOT 0.3 07/30/2018 1217   BILITOT 0.24 05/29/2016 1119       RADIOGRAPHIC STUDIES: I have personally reviewed the radiological images as listed and agreed with the findings in the report. Dg Chest 2 View  Result Date: 11/04/2018 CLINICAL DATA:  Patient with a history of non-small cell lung carcinoma. Status post left lower lobectomy 04/07/2013.  EXAM: CHEST - 2 VIEW COMPARISON:  PA and lateral chest 07/09/2014. CT chest 05/30/2017. FINDINGS: There is some volume loss in the left chest and left basilar scarring consistent with the patient's history of prior surgery. The lungs are otherwise clear. Emphysematous disease is noted. Heart size is normal. Atherosclerosis is identified. No acute or focal bony abnormality. IMPRESSION: No acute disease. Atherosclerosis. Emphysema. Electronically Signed   By: Marcello Moores  Dalessio M.D.   On: 11/04/2018 15:18    All questions were answered. The patient knows to call the clinic with any problems, questions or concerns. No barriers to learning was detected.  I spent 15 minutes counseling the patient face to face. The total time spent in the appointment was 20 minutes and more than 50% was on counseling and review of test results  Heath Lark, MD 11/05/2018 7:53 AM

## 2018-11-05 NOTE — Assessment & Plan Note (Signed)
She has chronic kidney disease since she has last received treatment She will continue close monitoring and risk factor modifications

## 2018-11-05 NOTE — Assessment & Plan Note (Signed)
Clinically, she have no signs to suggest cancer recurrence I have reviewed the chest x-ray myself which looks benign However, his CT chest from last year show lung nodules Based on current guidelines, I recommend 1 more CT scan of the chest without contrast to complete the evaluation If the CT scan is negative, we will discharge her from the clinic She is up-to-date with influenza vaccination

## 2018-11-05 NOTE — Assessment & Plan Note (Signed)
This is likely anemia of chronic disease. The patient denies recent history of bleeding such as epistaxis, hematuria or hematochezia. She is asymptomatic from the anemia. We will observe for now.  

## 2018-11-06 ENCOUNTER — Ambulatory Visit: Payer: Self-pay

## 2018-11-07 ENCOUNTER — Ambulatory Visit: Payer: Self-pay

## 2018-11-12 ENCOUNTER — Ambulatory Visit: Payer: Self-pay

## 2018-11-12 DIAGNOSIS — H43812 Vitreous degeneration, left eye: Secondary | ICD-10-CM | POA: Diagnosis not present

## 2018-11-12 DIAGNOSIS — H43392 Other vitreous opacities, left eye: Secondary | ICD-10-CM | POA: Diagnosis not present

## 2018-11-13 ENCOUNTER — Other Ambulatory Visit: Payer: Self-pay | Admitting: Family Medicine

## 2018-11-13 ENCOUNTER — Ambulatory Visit (HOSPITAL_COMMUNITY)
Admission: RE | Admit: 2018-11-13 | Discharge: 2018-11-13 | Disposition: A | Discharge status: Home / Self Care | Payer: Medicare Other | Source: Ambulatory Visit | Attending: Hematology and Oncology | Admitting: Hematology and Oncology

## 2018-11-13 ENCOUNTER — Other Ambulatory Visit: Payer: Self-pay

## 2018-11-13 DIAGNOSIS — C3490 Malignant neoplasm of unspecified part of unspecified bronchus or lung: Secondary | ICD-10-CM

## 2018-11-13 DIAGNOSIS — Z1231 Encounter for screening mammogram for malignant neoplasm of breast: Secondary | ICD-10-CM

## 2018-11-14 ENCOUNTER — Telehealth: Payer: Self-pay | Admitting: *Deleted

## 2018-11-14 NOTE — Telephone Encounter (Signed)
-----   Message from Heath Lark, MD sent at 11/14/2018  8:26 AM EDT ----- Regarding: CT showed no evidence of disease Pls call her/daughter Her CT showed no new changes

## 2018-11-14 NOTE — Telephone Encounter (Signed)
Spoke to patient's daughter and advised results as directed below.

## 2018-12-02 ENCOUNTER — Other Ambulatory Visit: Payer: Self-pay | Admitting: Family Medicine

## 2018-12-11 DIAGNOSIS — H26492 Other secondary cataract, left eye: Secondary | ICD-10-CM | POA: Diagnosis not present

## 2018-12-11 DIAGNOSIS — Z961 Presence of intraocular lens: Secondary | ICD-10-CM | POA: Diagnosis not present

## 2018-12-11 DIAGNOSIS — H18413 Arcus senilis, bilateral: Secondary | ICD-10-CM | POA: Diagnosis not present

## 2018-12-11 DIAGNOSIS — I10 Essential (primary) hypertension: Secondary | ICD-10-CM | POA: Diagnosis not present

## 2019-01-02 ENCOUNTER — Telehealth: Payer: Self-pay | Admitting: *Deleted

## 2019-01-02 NOTE — Telephone Encounter (Signed)
Schedule awv  

## 2019-01-03 ENCOUNTER — Other Ambulatory Visit: Payer: Self-pay

## 2019-01-03 ENCOUNTER — Telehealth: Payer: Self-pay | Admitting: Family Medicine

## 2019-01-03 ENCOUNTER — Ambulatory Visit
Admission: RE | Admit: 2019-01-03 | Discharge: 2019-01-03 | Disposition: A | Discharge status: Home / Self Care | Payer: Medicare Other | Source: Ambulatory Visit | Attending: Family Medicine | Admitting: Family Medicine

## 2019-01-03 DIAGNOSIS — Z1231 Encounter for screening mammogram for malignant neoplasm of breast: Secondary | ICD-10-CM | POA: Diagnosis not present

## 2019-01-03 NOTE — Telephone Encounter (Signed)
pts daughter returned call to set up mothers AWV

## 2019-01-07 DIAGNOSIS — C3490 Malignant neoplasm of unspecified part of unspecified bronchus or lung: Secondary | ICD-10-CM | POA: Diagnosis not present

## 2019-01-07 DIAGNOSIS — N189 Chronic kidney disease, unspecified: Secondary | ICD-10-CM | POA: Diagnosis not present

## 2019-01-07 DIAGNOSIS — N184 Chronic kidney disease, stage 4 (severe): Secondary | ICD-10-CM | POA: Diagnosis not present

## 2019-01-07 DIAGNOSIS — I129 Hypertensive chronic kidney disease with stage 1 through stage 4 chronic kidney disease, or unspecified chronic kidney disease: Secondary | ICD-10-CM | POA: Diagnosis not present

## 2019-01-07 DIAGNOSIS — R918 Other nonspecific abnormal finding of lung field: Secondary | ICD-10-CM | POA: Diagnosis not present

## 2019-01-07 DIAGNOSIS — N2581 Secondary hyperparathyroidism of renal origin: Secondary | ICD-10-CM | POA: Diagnosis not present

## 2019-01-28 ENCOUNTER — Ambulatory Visit: Payer: Medicare Other | Admitting: Family Medicine

## 2019-02-03 ENCOUNTER — Encounter: Payer: Self-pay | Admitting: Family Medicine

## 2019-02-13 ENCOUNTER — Telehealth: Payer: Self-pay | Admitting: Family Medicine

## 2019-02-13 NOTE — Telephone Encounter (Signed)
Karsynn and daughter say that appt for 1/05/201 was supposed a virtual visit. They waited  for call all day , do not want to be charged NO SHOW fee,OV was not what they wanted FR

## 2019-02-15 ENCOUNTER — Other Ambulatory Visit: Payer: Self-pay | Admitting: Family Medicine

## 2019-04-18 DIAGNOSIS — C3490 Malignant neoplasm of unspecified part of unspecified bronchus or lung: Secondary | ICD-10-CM | POA: Diagnosis not present

## 2019-04-18 DIAGNOSIS — R918 Other nonspecific abnormal finding of lung field: Secondary | ICD-10-CM | POA: Diagnosis not present

## 2019-04-18 DIAGNOSIS — N184 Chronic kidney disease, stage 4 (severe): Secondary | ICD-10-CM | POA: Diagnosis not present

## 2019-04-18 DIAGNOSIS — N2581 Secondary hyperparathyroidism of renal origin: Secondary | ICD-10-CM | POA: Diagnosis not present

## 2019-04-18 DIAGNOSIS — N189 Chronic kidney disease, unspecified: Secondary | ICD-10-CM | POA: Diagnosis not present

## 2019-04-18 DIAGNOSIS — I129 Hypertensive chronic kidney disease with stage 1 through stage 4 chronic kidney disease, or unspecified chronic kidney disease: Secondary | ICD-10-CM | POA: Diagnosis not present

## 2019-04-24 DIAGNOSIS — N184 Chronic kidney disease, stage 4 (severe): Secondary | ICD-10-CM | POA: Diagnosis not present

## 2019-04-24 DIAGNOSIS — N189 Chronic kidney disease, unspecified: Secondary | ICD-10-CM | POA: Diagnosis not present

## 2019-04-30 ENCOUNTER — Other Ambulatory Visit: Payer: Self-pay | Admitting: Family Medicine

## 2019-04-30 NOTE — Telephone Encounter (Signed)
Call to patient to patient to schedule follow up appointment- family is upset due to $25 charge for missed visit. They relayed that they were told that they would not be charged this fee because they waited for virtual call all day and did not receive it. They would like to schedule- but will not schedule until this fee is taken care of. Please review and reach out for appointment if fee can be forgiven- patient needs medication and would like an appointment.

## 2019-05-09 ENCOUNTER — Other Ambulatory Visit: Payer: Self-pay | Admitting: Family Medicine

## 2019-05-26 ENCOUNTER — Other Ambulatory Visit: Payer: Self-pay | Admitting: Family Medicine

## 2019-06-04 ENCOUNTER — Other Ambulatory Visit: Payer: Self-pay | Admitting: Family Medicine

## 2019-06-17 ENCOUNTER — Ambulatory Visit (INDEPENDENT_AMBULATORY_CARE_PROVIDER_SITE_OTHER): Payer: Medicare Other | Admitting: Emergency Medicine

## 2019-06-17 ENCOUNTER — Other Ambulatory Visit: Payer: Self-pay

## 2019-06-17 ENCOUNTER — Encounter: Payer: Self-pay | Admitting: Emergency Medicine

## 2019-06-17 VITALS — BP 117/56 | HR 49 | Temp 97.3°F | Resp 16 | Ht 61.0 in | Wt 167.0 lb

## 2019-06-17 DIAGNOSIS — N184 Chronic kidney disease, stage 4 (severe): Secondary | ICD-10-CM

## 2019-06-17 DIAGNOSIS — Z85118 Personal history of other malignant neoplasm of bronchus and lung: Secondary | ICD-10-CM

## 2019-06-17 DIAGNOSIS — Z8673 Personal history of transient ischemic attack (TIA), and cerebral infarction without residual deficits: Secondary | ICD-10-CM | POA: Diagnosis not present

## 2019-06-17 DIAGNOSIS — Z23 Encounter for immunization: Secondary | ICD-10-CM | POA: Diagnosis not present

## 2019-06-17 DIAGNOSIS — Z8639 Personal history of other endocrine, nutritional and metabolic disease: Secondary | ICD-10-CM

## 2019-06-17 DIAGNOSIS — I129 Hypertensive chronic kidney disease with stage 1 through stage 4 chronic kidney disease, or unspecified chronic kidney disease: Secondary | ICD-10-CM

## 2019-06-17 DIAGNOSIS — Z7689 Persons encountering health services in other specified circumstances: Secondary | ICD-10-CM

## 2019-06-17 DIAGNOSIS — Z8739 Personal history of other diseases of the musculoskeletal system and connective tissue: Secondary | ICD-10-CM | POA: Diagnosis not present

## 2019-06-17 MED ORDER — ATORVASTATIN CALCIUM 10 MG PO TABS: 10.0000 mg | ORAL_TABLET | Freq: Every day | ORAL | 3 refills | Status: DC

## 2019-06-17 MED ORDER — CLOPIDOGREL BISULFATE 75 MG PO TABS: 75.0000 mg | ORAL_TABLET | Freq: Every day | ORAL | 3 refills | Status: DC

## 2019-06-17 MED ORDER — TELMISARTAN 40 MG PO TABS: 40.0000 mg | ORAL_TABLET | Freq: Every day | ORAL | 3 refills | Status: DC

## 2019-06-17 MED ORDER — CHLORTHALIDONE 25 MG PO TABS: 25.0000 mg | ORAL_TABLET | Freq: Every day | ORAL | 3 refills | Status: DC

## 2019-06-17 MED ORDER — METOPROLOL SUCCINATE ER 25 MG PO TB24: ORAL_TABLET | ORAL | 3 refills | Status: DC

## 2019-06-17 NOTE — Patient Instructions (Addendum)
   If you have lab work done today you will be contacted with your lab results within the next 2 weeks.  If you have not heard from us then please contact us. The fastest way to get your results is to register for My Chart.   IF you received an x-ray today, you will receive an invoice from [facility]. Please contact [facility] at [phone] with questions or concerns regarding your invoice.   IF you received labwork today, you will receive an invoice from [facility]. Please contact [facility] at [phone] with questions or concerns regarding your invoice.   Our billing staff will not be able to assist you with questions regarding bills from these companies.  You will be contacted with the lab results as soon as they are available. The fastest way to get your results is to activate your My Chart account. Instructions are located on the last page of this paperwork. If you have not heard from us regarding the results in 2 weeks, please contact this office.     Health Maintenance After Age 65 After age 65, you are at a higher risk for certain long-term diseases and infections as well as injuries from falls. Falls are a major cause of broken bones and head injuries in people who are older than age 65. Getting regular preventive care can help to keep you healthy and well. Preventive care includes getting regular testing and making lifestyle changes as recommended by your health care provider. Talk with your health care provider about:  Which screenings and tests you should have. A screening is a test that checks for a disease when you have no symptoms.  A diet and exercise plan that is right for you. What should I know about screenings and tests to prevent falls? Screening and testing are the best ways to find a health problem early. Early diagnosis and treatment give you the best chance of managing medical conditions that are common after age 65. Certain conditions and  lifestyle choices may make you more likely to have a fall. Your health care provider may recommend:  Regular vision checks. Poor vision and conditions such as cataracts can make you more likely to have a fall. If you wear glasses, make sure to get your prescription updated if your vision changes.  Medicine review. Work with your health care provider to regularly review all of the medicines you are taking, including over-the-counter medicines. Ask your health care provider about any side effects that may make you more likely to have a fall. Tell your health care provider if any medicines that you take make you feel dizzy or sleepy.  Osteoporosis screening. Osteoporosis is a condition that causes the bones to get weaker. This can make the bones weak and cause them to break more easily.  Blood pressure screening. Blood pressure changes and medicines to control blood pressure can make you feel dizzy.  Strength and balance checks. Your health care provider may recommend certain tests to check your strength and balance while standing, walking, or changing positions.  Foot health exam. Foot pain and numbness, as well as not wearing proper footwear, can make you more likely to have a fall.  Depression screening. You may be more likely to have a fall if you have a fear of falling, feel emotionally low, or feel unable to do activities that you used to do.  Alcohol use screening. Using too much alcohol can affect your balance and may make you more likely to   have a fall. What actions can I take to lower my risk of falls? General instructions  Talk with your health care provider about your risks for falling. Tell your health care provider if: ? You fall. Be sure to tell your health care provider about all falls, even ones that seem minor. ? You feel dizzy, sleepy, or off-balance.  Take over-the-counter and prescription medicines only as told by your health care provider. These include any  supplements.  Eat a healthy diet and maintain a healthy weight. A healthy diet includes low-fat dairy products, low-fat (lean) meats, and fiber from whole grains, beans, and lots of fruits and vegetables. Home safety  Remove any tripping hazards, such as rugs, cords, and clutter.  Install safety equipment such as grab bars in bathrooms and safety rails on stairs.  Keep rooms and walkways well-lit. Activity   Follow a regular exercise program to stay fit. This will help you maintain your balance. Ask your health care provider what types of exercise are appropriate for you.  If you need a cane or walker, use it as recommended by your health care provider.  Wear supportive shoes that have nonskid soles. Lifestyle  Do not drink alcohol if your health care provider tells you not to drink.  If you drink alcohol, limit how much you have: ? 0-1 drink a day for women. ? 0-2 drinks a day for men.  Be aware of how much alcohol is in your drink. In the [place] one drink equals one typical bottle of beer (12 oz), one-half glass of wine (5 oz), or one shot of hard liquor (1 oz).  Do not use any products that contain nicotine or tobacco, such as cigarettes and e-cigarettes. If you need help quitting, ask your health care provider. Summary  Having a healthy lifestyle and getting preventive care can help to protect your health and wellness after age 65.  Screening and testing are the best way to find a health problem early and help you avoid having a fall. Early diagnosis and treatment give you the best chance for managing medical conditions that are more common for people who are older than age 65.  Falls are a major cause of broken bones and head injuries in people who are older than age 65. Take precautions to prevent a fall at home.  Work with your health care provider to learn what changes you can make to improve your health and wellness and to prevent falls. This information is not intended  to replace advice given to you by your health care provider. Make sure you discuss any questions you have with your health care provider. Document Revised: 05/02/2018 Document Reviewed: 11/22/2016 Elsevier Patient Education  2020 [employer]  

## 2019-06-17 NOTE — Progress Notes (Signed)
[name] 84 y.o.   Chief Complaint  Patient presents with  . Establish Care    former Dr Nolon Rod patient    HISTORY OF PRESENT ILLNESS: This is a 84 y.o. female here to establish care with me.  Former patient of Dr. Nolon Rod.  Has the following chronic medical problems: 1.  History of lung cancer: Cancer free.  Status post left lobectomy followed by chemotherapy. 2.  Hypertension with chronic kidney disease.  Sees renal doctor on a regular basis. 3.  History of TIA in the past.  Allergic to aspirin.  On Plavix. 4.  History of arthritis and occasional neuropathic pain to her legs.  Takes gabapentin at bedtime. 5.  Dyslipidemia: On atorvastatin 10 mg daily.  HPI   Prior to Admission medications   Medication Sig Start Date End Date Taking? Authorizing Provider  acetaminophen (TYLENOL) 500 MG tablet Take 1 tablet (500 mg total) by mouth every 6 (six) hours as needed. 01/15/17  Yes Burky, Lanelle Bal B, NP  atorvastatin (LIPITOR) 10 MG tablet TAKE 1 TABLET (10 MG TOTAL) BY MOUTH DAILY AT 6 PM. 07/19/18  Yes Stallings, Zoe A, MD  chlorthalidone (HYGROTON) 25 MG tablet TAKE 1 TABLET BY MOUTH EVERY DAY 06/04/19  Yes Stallings, Zoe A, MD  Cholecalciferol (VITAMIN D-3) 1000 units CAPS Take 1 capsule (1,000 Units total) by mouth daily. 08/02/17  Yes Stallings, Zoe A, MD  clopidogrel (PLAVIX) 75 MG tablet TAKE 1 TABLET (75 MG TOTAL) BY MOUTH DAILY WITH BREAKFAST. 10/23/18  Yes Stallings, Zoe A, MD  magnesium oxide (MAG-OX) 400 MG tablet Take 1 tablet (400 mg total) by mouth daily. 08/02/17  Yes Stallings, Zoe A, MD  metoprolol succinate (TOPROL-XL) 25 MG 24 hr tablet TAKE 1 TABLET (25MG ) BY ORAL ROUTE TWICE A DAY 05/26/19  Yes Stallings, Zoe A, MD  Multiple Vitamin (MULTIVITAMIN) tablet Take 1 tablet by mouth at bedtime.    Yes [provider]  telmisartan (MICARDIS) 40 MG tablet TAKE 1 TABLET BY MOUTH EVERY DAY 04/30/19  Yes Forrest Moron, MD    Allergies  Allergen Reactions  .  Aspirin Other (See Comments)    REACTION: stomach upset  . Lactose Intolerance (Gi) Diarrhea and Other (See Comments)    "can't drink milk. It tears my stomach up."  . Adhesive [Tape] Rash  . Lisinopril Other (See Comments)    cough  . Percocet [Oxycodone-Acetaminophen] Itching    Patient Active Problem List   Diagnosis Date Noted  . Sacroiliac joint disease 08/02/2017  . History of TIA (transient ischemic attack) 08/02/2017  . Vitamin D deficiency 08/02/2017  . Arthritis 08/02/2017  . Long term current use of anticoagulant therapy 08/02/2017  . Left thyroid nodule 01/01/2015  . Benign hypertension with CKD (chronic kidney disease) stage IV ([place]) 11/06/2013  . Anemia in chronic kidney disease 05/28/2013  . History of lung cancer 04/07/2013  . Essential hypertension, benign 03/05/2013  . Personal history of colonic polyps 10/29/2012    Past Medical History:  Diagnosis Date  . Arthritis   . Benign hypertensive kidney disease with chronic kidney disease stage I through stage IV, or unspecified(403.10)    sees dr. Justin Mend again in april  . Cancer ([place])    lung ca dx'd 02/2013  . Chronic kidney disease, stage III (moderate)   . Cough 02/26/2013  . GERD (gastroesophageal reflux disease)   . Headache   . Hypertension   . Hyperthyroidism    took iodine treatment for this  .  Lung mass 02/27/2013  . Neurologic disorder    2009  . Shortness of breath    exertion  . Thrombocytopenia, unspecified ([place]) 02/26/2013  . Unspecified vitamin D deficiency     Past Surgical History:  Procedure Laterality Date  . BACK SURGERY    . EYE SURGERY     cataract  . EYE SURGERY Left    open up tear duct  . LYMPH NODE DISSECTION Left 04/07/2013   Procedure: LYMPH NODE DISSECTION;  Surgeon: [name], MD;  Location: [place];  Service: Thoracic;  Laterality: Left;  . NECK SURGERY    . PORT-A-CATH REMOVAL Left 01/09/2014   Procedure: REMOVAL PORT-A-CATH;  Surgeon: [name], MD;   Location: [place];  Service: Thoracic;  Laterality: Left;  . PORTACATH PLACEMENT Left 05/06/2013   Procedure: INSERTION PORT-A-CATH;  Surgeon: [name], MD;  Location: [place];  Service: Thoracic;  Laterality: Left;  Marland Kitchen VIDEO ASSISTED THORACOSCOPY (VATS)/ LOBECTOMY Left 04/07/2013   Procedure: VIDEO ASSISTED THORACOSCOPY (VATS)/ LOBECTOMY; INSERTION OF ON-Q PUMP;  Surgeon: [name], MD;  Location: [place];  Service: Thoracic;  Laterality: Left;  Marland Kitchen VIDEO BRONCHOSCOPY N/A 04/07/2013   Procedure: VIDEO BRONCHOSCOPY;  Surgeon: [name], MD;  Location: [facility] OR;  Service: Thoracic;  Laterality: N/A;    Social History   Socioeconomic History  . Marital status: Single    Spouse name: Not on file  . Number of children: 2  . Years of education: Not on file  . Highest education level: Not on file  Occupational History  . Not on file  Tobacco Use  . Smoking status: Former Smoker    Packs/day: 0.50    Years: 60.00    Pack years: 30.00    Types: Cigarettes    Quit date: 02/25/2012    Years since quitting: 7.3  . Smokeless tobacco: Never Used  Substance and Sexual Activity  . Alcohol use: No  . Drug use: No  . Sexual activity: Not on file  Other Topics Concern  . Not on file  Social History Narrative  . Not on file   Social Determinants of Health   Financial Resource Strain:   . Difficulty of Paying Living Expenses:   Food Insecurity:   . Worried About Charity fundraiser in the Last Year:   . Arboriculturist in the Last Year:   Transportation Needs:   . Film/video editor (Medical):   Marland Kitchen Lack of Transportation (Non-Medical):   Physical Activity:   . Days of Exercise per Week:   . Minutes of Exercise per Session:   Stress:   . Feeling of Stress :   Social Connections:   . Frequency of Communication with Friends and Family:   . Frequency of Social Gatherings with Friends and Family:   . Attends Religious Services:   . Active Member of Clubs or Organizations:     . Attends Archivist Meetings:   Marland Kitchen Marital Status:   Intimate Partner Violence:   . Fear of Current or Ex-Partner:   . Emotionally Abused:   Marland Kitchen Physically Abused:   . Sexually Abused:     Family History  Problem Relation Age of Onset  . Lung cancer Brother        smoked  . Breast cancer Mother   . Lung cancer Father        smoked  . Rectal cancer Neg Hx   . Stomach cancer Neg Hx   .  Esophageal cancer Neg Hx   . Colon cancer Neg Hx      Review of Systems  Constitutional: Negative.  Negative for chills and fever.  HENT: Negative.  Negative for congestion and sore throat.   Respiratory: Negative.  Negative for cough and shortness of breath.   Cardiovascular: Negative.  Negative for chest pain and palpitations.  Gastrointestinal: Negative.  Negative for abdominal pain, blood in stool, diarrhea, melena, nausea and vomiting.  Genitourinary: Negative.  Negative for dysuria and hematuria.  Skin: Negative.  Negative for rash.  All other systems reviewed and are negative.  Today's Vitals   06/17/19 1453  BP: (!) 117/56  Pulse: (!) 49  Resp: 16  Temp: (!) 97.3 F (36.3 C)  TempSrc: Temporal  SpO2: 97%  Weight: 167 lb (75.8 kg)  Height: 5\' 1"  (1.549 m)   Body mass index is 31.55 kg/m.   Physical Exam Vitals reviewed.  Constitutional:      Appearance: Normal appearance.  HENT:     Head: Normocephalic.  Eyes:     Extraocular Movements: Extraocular movements intact.     Pupils: Pupils are equal, round, and reactive to light.  Cardiovascular:     Rate and Rhythm: Normal rate and regular rhythm.     Pulses: Normal pulses.     Heart sounds: Normal heart sounds.  Pulmonary:     Effort: Pulmonary effort is normal.     Breath sounds: Normal breath sounds.  Musculoskeletal:        General: Normal range of motion.     Cervical back: Normal range of motion and neck supple.     Right lower leg: No edema.     Left lower leg: No edema.  Skin:    General: Skin is  warm and dry.     Capillary Refill: Capillary refill takes less than 2 seconds.  Neurological:     General: No focal deficit present.     Mental Status: She is alert and oriented to person, place, and time.  Psychiatric:        Mood and Affect: Mood normal.        Behavior: Behavior normal.    A total of 30 minutes was spent with the patient, greater than 50% of which was in counseling/coordination of care regarding multiple chronic medical problems, review of most recent office visit notes, review of all medications, diet and nutrition, prognosis and need for follow-up.   ASSESSMENT & PLAN: [name] was seen today for establish care and medication refill.  Diagnoses and all orders for this visit:  Encounter to establish care  Benign hypertension with CKD (chronic kidney disease) stage IV (H[facility]) -     chlorthalidone (HYGROTON) 25 MG tablet; Take 1 tablet (25 mg total) by mouth daily. -     metoprolol succinate (TOPROL-XL) 25 MG 24 hr tablet; Take 1 tablet (25 mg) by oral route twice a day -     telmisartan (MICARDIS) 40 MG tablet; Take 1 tablet (40 mg total) by mouth daily.  History of lung cancer  History of TIA (transient ischemic attack) -     clopidogrel (PLAVIX) 75 MG tablet; Take 1 tablet (75 mg total) by mouth daily with breakfast.  History of arthritis  History of high cholesterol -     atorvastatin (LIPITOR) 10 MG tablet; Take 1 tablet (10 mg total) by mouth daily at 6 PM.  Need for vaccination with 13-polyvalent pneumococcal conjugate vaccine -     Pneumococcal conjugate vaccine  13-valent IM     Patient Instructions       If you have lab work done today you will be contacted with your lab results within the next 2 weeks.  If you have not heard from [place] then please contact us. The fastest way to get your results is to register for My Chart.   IF you received an x-ray today, you will receive an invoice from [facility]. Please contact [facility]  at [phone] with questions or concerns regarding your invoice.   IF you received labwork today, you will receive an invoice from [facility]. Please contact [facility] at [phone] with questions or concerns regarding your invoice.   Our billing staff will not be able to assist you with questions regarding bills from these companies.  You will be contacted with the lab results as soon as they are available. The fastest way to get your results is to activate your My Chart account. Instructions are located on the last page of this paperwork. If you have not heard from [place] regarding the results in 2 weeks, please contact this office.     Health Maintenance After Age 30 After age 37, you are at a higher risk for certain long-term diseases and infections as well as injuries from falls. Falls are a major cause of broken bones and head injuries in people who are older than age 62. Getting regular preventive care can help to keep you healthy and well. Preventive care includes getting regular testing and making lifestyle changes as recommended by your health care provider. Talk with your health care provider about:  Which screenings and tests you should have. A screening is a test that checks for a disease when you have no symptoms.  A diet and exercise plan that is right for you. What should I know about screenings and tests to prevent falls? Screening and testing are the best ways to find a health problem early. Early diagnosis and treatment give you the best chance of managing medical conditions that are common after age 16. Certain conditions and lifestyle choices may make you more likely to have a fall. Your health care provider may recommend:  Regular vision checks. Poor vision and conditions such as cataracts can make you more likely to have a fall. If you wear glasses, make sure to get your prescription updated if your vision changes.  Medicine review. Work with your health care provider to  regularly review all of the medicines you are taking, including over-the-counter medicines. Ask your health care provider about any side effects that may make you more likely to have a fall. Tell your health care provider if any medicines that you take make you feel dizzy or sleepy.  Osteoporosis screening. Osteoporosis is a condition that causes the bones to get weaker. This can make the bones weak and cause them to break more easily.  Blood pressure screening. Blood pressure changes and medicines to control blood pressure can make you feel dizzy.  Strength and balance checks. Your health care provider may recommend certain tests to check your strength and balance while standing, walking, or changing positions.  Foot health exam. Foot pain and numbness, as well as not wearing proper footwear, can make you more likely to have a fall.  Depression screening. You may be more likely to have a fall if you have a fear of falling, feel emotionally low, or feel unable to do activities that you used to do.  Alcohol use screening. Using too much  alcohol can affect your balance and may make you more likely to have a fall. What actions can I take to lower my risk of falls? General instructions  Talk with your health care provider about your risks for falling. Tell your health care provider if: ? You fall. Be sure to tell your health care provider about all falls, even ones that seem minor. ? You feel dizzy, sleepy, or off-balance.  Take over-the-counter and prescription medicines only as told by your health care provider. These include any supplements.  Eat a healthy diet and maintain a healthy weight. A healthy diet includes low-fat dairy products, low-fat (lean) meats, and fiber from whole grains, beans, and lots of fruits and vegetables. Home safety  Remove any tripping hazards, such as rugs, cords, and clutter.  Install safety equipment such as grab bars in bathrooms and safety rails on  stairs.  Keep rooms and walkways well-lit. Activity   Follow a regular exercise program to stay fit. This will help you maintain your balance. Ask your health care provider what types of exercise are appropriate for you.  If you need a cane or walker, use it as recommended by your health care provider.  Wear supportive shoes that have nonskid soles. Lifestyle  Do not drink alcohol if your health care provider tells you not to drink.  If you drink alcohol, limit how much you have: ? 0-1 drink a day for women. ? 0-2 drinks a day for men.  Be aware of how much alcohol is in your drink. In the [place] one drink equals one typical bottle of beer (12 oz), one-half glass of wine (5 oz), or one shot of hard liquor (1 oz).  Do not use any products that contain nicotine or tobacco, such as cigarettes and e-cigarettes. If you need help quitting, ask your health care provider. Summary  Having a healthy lifestyle and getting preventive care can help to protect your health and wellness after age 7.  Screening and testing are the best way to find a health problem early and help you avoid having a fall. Early diagnosis and treatment give you the best chance for managing medical conditions that are more common for people who are older than age 28.  Falls are a major cause of broken bones and head injuries in people who are older than age 20. Take precautions to prevent a fall at home.  Work with your health care provider to learn what changes you can make to improve your health and wellness and to prevent falls. This information is not intended to replace advice given to you by your health care provider. Make sure you discuss any questions you have with your health care provider. Document Revised: 05/02/2018 Document Reviewed: 11/22/2016 Elsevier Patient Education  2020 [employer]      Agustina Caroli, MD [facility]

## 2019-08-22 DIAGNOSIS — N189 Chronic kidney disease, unspecified: Secondary | ICD-10-CM | POA: Diagnosis not present

## 2019-08-22 DIAGNOSIS — I129 Hypertensive chronic kidney disease with stage 1 through stage 4 chronic kidney disease, or unspecified chronic kidney disease: Secondary | ICD-10-CM | POA: Diagnosis not present

## 2019-08-22 DIAGNOSIS — C3490 Malignant neoplasm of unspecified part of unspecified bronchus or lung: Secondary | ICD-10-CM | POA: Diagnosis not present

## 2019-08-22 DIAGNOSIS — R918 Other nonspecific abnormal finding of lung field: Secondary | ICD-10-CM | POA: Diagnosis not present

## 2019-08-22 DIAGNOSIS — N2581 Secondary hyperparathyroidism of renal origin: Secondary | ICD-10-CM | POA: Diagnosis not present

## 2019-08-22 DIAGNOSIS — N184 Chronic kidney disease, stage 4 (severe): Secondary | ICD-10-CM | POA: Diagnosis not present

## 2019-09-11 ENCOUNTER — Ambulatory Visit (INDEPENDENT_AMBULATORY_CARE_PROVIDER_SITE_OTHER): Payer: Medicare Other | Admitting: Cardiothoracic Surgery

## 2019-09-11 ENCOUNTER — Other Ambulatory Visit: Payer: Self-pay

## 2019-09-11 VITALS — BP 180/76 | HR 56 | Temp 97.0°F | Resp 20 | Ht 61.0 in | Wt 165.0 lb

## 2019-09-11 DIAGNOSIS — Z85118 Personal history of other malignant neoplasm of bronchus and lung: Secondary | ICD-10-CM | POA: Diagnosis not present

## 2019-09-11 DIAGNOSIS — R911 Solitary pulmonary nodule: Secondary | ICD-10-CM | POA: Diagnosis not present

## 2019-09-11 NOTE — Progress Notes (Signed)
[address]       [place] [address]             [id number]      [name] Man Medical Record #[id number] Date of Birth: Dec 12, 1935  Referring: Tanda Rockers, MD Primary Care: Horald Pollen, MD  Chief Complaint:   POST OP FOLLOW UP 04/07/2013  OPERATIVE REPORT  Preop diagnosis: Left lower lobe lung mass suspicious for primary carcinoma the lung  Postop diagnosis: Same, non-small cell lung cancer probably adenocarcinoma by frozen section  Procedure:  Bronchoscopy  Left video-assisted thoracoscopy  Left lower lobectomy and lymph node dissection  Placement of On-Q device  Surgeon: [name] MD   Stage IB ( pT2a,pN0,cM0)  Diagnosis 1. Lung, resection (segmental or lobe), Left lower lobe - INVASIVE ADENOCARCINOMA, POORLY DIFFERENTIATED, SPANNING 4.1 CM. - ADENOCARCINOMA INVOLVES PLEURA. - THE SURGICAL RESECTION MARGINS ARE NEGATIVE FOR ADENOCARCINOMA. - ONE BENIGN HILAR LYMPH NODE (0/1). - SEE ONCOLOGY TABLE BELOW. 2. Lymph node, biopsy, 8 L - THERE IS NO EVIDENCE OF CARCINOMA IN 1 OF 1 LYMPH NODE (0/1). 3. Lymph node, biopsy, 11 L - THERE IS NO EVIDENCE OF CARCINOMA IN 1 OF 1 LYMPH NODE (0/1). 4. Lymph node, biopsy, 12 L - THERE IS NO EVIDENCE OF CARCINOMA IN 1 OF 1 LYMPH NODE (0/1). 5. Lymph node, biopsy, 10 L - THERE IS NO EVIDENCE OF CARCINOMA IN 1 OF 1 LYMPH NODE (0/1). 6. Lymph node, biopsy, 4 L - THERE IS NO EVIDENCE OF CARCINOMA IN 1 OF 1 LYMPH NODE (0/1).  History of Present Illness:       Patient remains active 84 years.   Following surgical resection 6  years ago.  She completed course of chemo in OCT 2015. She completed 4 cycles of adjuvant chemotherapy with navelbine and cisplatin  Follow-up CT scans were done at oncology office-the most recent scan was 9 months ago October 2020-there was a 7 mm stable nodule left lower lobe. -However repeat scan on follow-up had been recommended by radiology.  Patient  was told she no longer needed to come to the oncology office.  She comes in today because of concern of her x-ray report and would like follow-up scan.   Past Medical History:  Diagnosis Date  . Arthritis   . Benign hypertensive kidney disease with chronic kidney disease stage I through stage IV, or unspecified(403.10)    sees dr. Justin Mend again in april  . Cancer ([place])    lung ca dx'd 02/2013  . Chronic kidney disease, stage III (moderate)   . Cough 02/26/2013  . GERD (gastroesophageal reflux disease)   . Headache   . Hypertension   . Hyperthyroidism    took iodine treatment for this  . Lung mass 02/27/2013  . Neurologic disorder    2009  . Shortness of breath    exertion  . Thrombocytopenia, unspecified ([place]) 02/26/2013  . Unspecified vitamin D deficiency      Social History   Tobacco Use  Smoking Status Former Smoker  . Packs/day: 0.50  . Years: 60.00  . Pack years: 30.00  . Types: Cigarettes  . Quit date: 02/25/2012  . Years since quitting: 7.5  Smokeless Tobacco Never Used    Social History   Substance and Sexual Activity  Alcohol Use No     Allergies  Allergen Reactions  . Aspirin Other (See Comments)    REACTION: stomach upset  . Lactose Intolerance (  Gi) Diarrhea and Other (See Comments)    "can't drink milk. It tears my stomach up."  . Adhesive [Tape] Rash  . Lisinopril Other (See Comments)    cough  . Percocet [Oxycodone-Acetaminophen] Itching    Current Outpatient Medications  Medication Sig Dispense Refill  . acetaminophen (TYLENOL) 500 MG tablet Take 1 tablet (500 mg total) by mouth every 6 (six) hours as needed. 30 tablet 0  . atorvastatin (LIPITOR) 10 MG tablet Take 1 tablet (10 mg total) by mouth daily at 6 PM. 90 tablet 3  . chlorthalidone (HYGROTON) 25 MG tablet Take 1 tablet (25 mg total) by mouth daily. 90 tablet 3  . Cholecalciferol (VITAMIN D-3) 1000 units CAPS Take 1 capsule (1,000 Units total) by mouth daily. 90 capsule 1  . clopidogrel  (PLAVIX) 75 MG tablet Take 1 tablet (75 mg total) by mouth daily with breakfast. 90 tablet 3  . gabapentin (NEURONTIN) 300 MG capsule Take 300 mg by mouth daily.    . magnesium oxide (MAG-OX) 400 MG tablet Take 1 tablet (400 mg total) by mouth daily. 90 tablet 1  . metoprolol succinate (TOPROL-XL) 25 MG 24 hr tablet Take 1 tablet (25 mg) by oral route twice a day 180 tablet 3  . Multiple Vitamin (MULTIVITAMIN) tablet Take 1 tablet by mouth at bedtime.     Marland Kitchen telmisartan (MICARDIS) 40 MG tablet Take 1 tablet (40 mg total) by mouth daily. 90 tablet 3   No current facility-administered medications for this visit.       Physical Exam: BP (!) 163/71 (BP Location: Right Arm, Patient Position: Sitting, Cuff Size: Normal)   Pulse (!) 56   Ht 5\' 1"  (1.549 m)   Wt 165 lb (74.8 kg)   SpO2 97% Comment: RA  BMI 31.18 kg/m   General appearance: alert, cooperative and no distress Neck: no adenopathy, no carotid bruit, no JVD, supple, symmetrical, trachea midline and thyroid not enlarged, symmetric, no tenderness/mass/nodules Lymph nodes: Cervical, supraclavicular, and axillary nodes normal. Resp: clear to auscultation bilaterally Cardio: regular rate and rhythm, S1, S2 normal, no murmur, click, rub or gallop Extremities: extremities normal, atraumatic, no cyanosis or edema Neurologic: Grossly normal  Diagnostic Studies & Laboratory data:     Recent Radiology Findings:     Study Result  CLINICAL DATA:  Patient with left lung cancer diagnosed in 2015 status post left lower lobe resection and chemotherapy. Follow-up evaluation.  EXAM: CT CHEST WITHOUT CONTRAST  TECHNIQUE: Multidetector CT imaging of the chest was performed following the standard protocol without IV contrast.  COMPARISON:  Chest CT 05/29/2016  FINDINGS: Cardiovascular: Normal heart size. No pericardial effusion. Thoracic aortic vascular calcifications. Coronary arterial vascular calcifications.   Mediastinum/Nodes: No enlarged axillary, mediastinal or hilar lymphadenopathy. Small hiatal hernia. Otherwise normal appearance of the esophagus.  Lungs/Pleura: Central airways are patent. Patient status post left lower lobectomy. Stable peripheral subpleural scarring left lower hemithorax. Similar-appearing 7 mm left lower lobe nodule (image 76; series 5). Slight interval increase in size 6 mm right upper lobe nodule (image 47; series 5), previously 5 mm. Centrilobular and paraseptal emphysematous changes. No pleural effusion or pneumothorax.  Upper Abdomen: No acute process. Stable 9 mm low-attenuation lesion right hepatic lobe (image 131; series 2).  Musculoskeletal: Thoracic spine degenerative changes. No aggressive or acute appearing osseous lesions.  IMPRESSION: 1. Slight interval increase in size of right upper lobe nodule. Recommend attention on follow-up. 2. Stable appearance of the left hemithorax with left lower lobectomy. 3. Aortic Atherosclerosis (  ICD10-I70.0) and Emphysema (ICD10-J43.9).   Electronically Signed   By: Lovey Newcomer M.D.   On: 05/31/2017 10:53   CLINICAL DATA:  Lung cancer.  EXAM: CT CHEST WITHOUT CONTRAST  TECHNIQUE: Multidetector CT imaging of the chest was performed following the standard protocol without IV contrast.  COMPARISON:  05/31/2015  FINDINGS: Cardiovascular: The heart size is normal. No pericardial effusion. Coronary artery calcification is noted. Atherosclerotic calcification is noted in the wall of the thoracic aorta.  Mediastinum/Nodes: No mediastinal lymphadenopathy. No evidence for gross hilar lymphadenopathy although assessment is limited by the lack of intravenous contrast on today's study. There is no axillary lymphadenopathy. Asymmetric nodular enlargement left thyroid lobe is stable. The esophagus has normal imaging features.  Lungs/Pleura: Emphysema is associated with bronchial wall thickening. 3 mm  right upper lobe nodule (image 49 series 5) is stable in the interval. Volume loss left hemithorax compatible with left lower lobectomy. Scarring in the lower left lung is unchanged. No focal airspace consolidation. No pulmonary edema or pleural effusion.  Upper Abdomen: 10 mm low-density lesion posterior right liver is stable in the interval.  Musculoskeletal: Bone windows reveal no worrisome lytic or sclerotic osseous lesions.  IMPRESSION: 1. Stable exam. Status post left lower lobectomy without evidence for new or progressive findings. 2. Emphysema. 3. Thoracic aortic atherosclerosis.   Electronically Signed   By: Misty Stanley M.D.   On: 05/29/2016 15:25      CLINICAL DATA:  Restaging non small cell lung cancer. Status post left lower lobe resection. Chemotherapy complete.  EXAM: CT CHEST WITHOUT CONTRAST  TECHNIQUE: Multidetector CT imaging of the chest was performed following the standard protocol without IV contrast.  COMPARISON:  12/31/2014  FINDINGS: Mediastinum/Nodes: No breast masses, supraclavicular or axillary lymphadenopathy. Stable multinodular goiter involving the left thyroid lobe.  The heart is normal in size. No pericardial effusion. Stable aortic and coronary artery calcifications.  No mediastinal or hilar mass or adenopathy. Small scattered lymph nodes are stable. The esophagus is grossly normal. There is a small hiatal hernia.  Lungs/Pleura: Stable emphysematous changes and pulmonary scarring. Stable surgical changes from a left lower lobe lung resection. Stable left basilar pleural and parenchymal scarring changes. No findings worrisome for recurrent tumor. No metastatic pulmonary nodules. No bronchiectasis or interstitial lung disease.  Upper abdomen: No significant findings.  Musculoskeletal: No significant findings. Stable degenerative changes involving the spine.  IMPRESSION: Status post resection of a left lower  lobe lung lesion without findings for recurrent tumor or metastatic disease.  No mediastinal or hilar mass or adenopathy.  Stable emphysematous changes but no acute pulmonary findings.  Stable atherosclerotic calcifications involving the aorta and branch vessels.  Stable left thyroid goiter.   Electronically Signed   By: Marijo Sanes M.D.   On: 05/31/2015 15:42     Ct Chest Wo Contrast  12/31/2014  CLINICAL DATA:  Subsequent evaluation of a 84 year old female with history of non-small cell carcinoma both the left lower lobe diagnosed in January 2015, status post left lower lobectomy and chemotherapy (completed). EXAM: CT CHEST WITHOUT CONTRAST TECHNIQUE: Multidetector CT imaging of the chest was performed following the standard protocol without IV contrast. COMPARISON:  Chest CT 07/23/2014. FINDINGS: Mediastinum/Lymph Nodes: Heart size is normal. There is no significant pericardial fluid, thickening or pericardial calcification. There is atherosclerosis of the thoracic aorta, the great vessels of the mediastinum and the coronary arteries, including calcified atherosclerotic plaque in the left anterior descending, left circumflex and right coronary arteries. No pathologically enlarged mediastinal  or hilar lymph nodes. Please note that accurate exclusion of hilar adenopathy is limited on noncontrast CT scans. Asymmetrically enlarged left lobe of thyroid gland which appears to be occupied by a dominant partially calcified nodule, which is slightly larger than the prior examination, measuring 2.8 x 3.2 cm on today's study (image 8 of series 2). Small hiatal hernia. No axillary lymphadenopathy. Lungs/Pleura: Status post left lower lobectomy. Compensatory hyperexpansion of the left upper lobe. Small amount of peripheral pleuroparenchymal thickening in the inferior aspect of the left upper lobe, similar to the prior study, most compatible with some mild chronic scarring. No suspicious appearing  pulmonary nodules or masses. No acute consolidative airspace disease. No pleural effusions. Mild diffuse bronchial wall thickening with mild centrilobular and paraseptal emphysema. Upper Abdomen: Atherosclerosis. 4 mm low-attenuation lesion in segment 2 of the liver again noted, incompletely characterized on today's noncontrast CT examination, but likely a tiny cyst. Musculoskeletal/Soft Tissues: Orthopedic fixation hardware in the lower cervical spine incompletely imaged. There are no aggressive appearing lytic or blastic lesions noted in the visualized portions of the skeleton. IMPRESSION: 1. Postoperative changes of left lower lobectomy. No findings to suggest recurrent or metastatic disease in the thorax. 2. Slight enlargement of a heterogeneous appearing nodule in the left lobe of the thyroid gland which is partially calcified measuring 2.8 x 3.2 cm on today's study. Further evaluation with nonemergent thyroid ultrasound is recommended at this time. 3. Mild diffuse bronchial wall thickening with mild centrilobular and paraseptal emphysema; imaging findings suggestive of underlying COPD. 4. Atherosclerosis, including three-vessel coronary artery disease. Assessment for potential risk factor modification, dietary therapy or pharmacologic therapy may be warranted, if clinically indicated. Electronically Signed   By: Vinnie Langton M.D.   On: 12/31/2014 14:54     CLINICAL DATA: Lung cancer, completed chemotherapy. Shortness of breath.  EXAM: CT CHEST WITH CONTRAST  TECHNIQUE: Multidetector CT imaging of the chest was performed during intravenous contrast administration.  CONTRAST: 56mL OMNIPAQUE IOHEXOL 300 MG/ML SOLN  COMPARISON: 07/01/2013  FINDINGS: 3.1 by 2.6 cm solid and partially calcified left thyroid nodule, stable.  Left subclavian Port-A-Cath tip: SVC.  Atherosclerotic aortic arch. Coronary artery atherosclerosis observed in the left anterior descending artery.   The trace left pleural effusion has nearly completely resolved. Focal pleural thickening or adjacent mild rounded atelectasis in the left lower lung is reduced in size and prominence on image 35 of series 2. This currently measures up to 6 mm in thickness.  No pathologic thoracic adenopathy.  Left lower lobectomy.  Indistinct 6 by 7 mm by 4 mm ground-glass density in the left lung on image 32 of series 5 posteriorly in, stable.  Thoracic spondylosis. Lower cervical plate and screw fixator.  Centrilobular emphysema.  Small focus of enhancement along the left side of the lateral segment left hepatic lobe, image 48 of series 2, stable, not previously hypermetabolic on prior PET-CT, probably flash filling of a hemangioma or a small perfusion anomaly.  IMPRESSION: 1. No findings of recurrence or significant change. 6 by 7 by 4 mm slightly sub solid left posterior nodule merits observation. 2. Stable left thyroid nodule. This was not previously hypermetabolic. 3. Stable small enhancing focus in the lateral segment left hepatic lobe, probably a flash filling hemangioma or perfusion anomaly, not previously hypermetabolic. 4. Centrilobular emphysema. 5. Left lower lobectomy. Focal scarring posteriorly in the remaining lower posterior left lung, with reduced size of the trace pleural effusion. 6. Atherosclerosis.   Electronically Signed  By: Sherryl Barters M.D.  On: 12/31/2013 15:39  Recent Lab Findings: Lab Results  Component Value Date   WBC 5.2 11/04/2018   HGB 11.3 (L) 11/04/2018   HCT 34.7 (L) 11/04/2018   PLT 236 11/04/2018   GLUCOSE 94 11/04/2018   CHOL 150 07/30/2018   TRIG 88 07/30/2018   HDL 47 07/30/2018   LDLCALC 85 07/30/2018   ALT 17 11/04/2018   AST 22 11/04/2018   NA 141 11/04/2018   K 4.5 11/04/2018   CL 111 11/04/2018   CREATININE 1.73 (H) 11/04/2018   BUN 25 (H) 11/04/2018   CO2 25 11/04/2018   TSH 1.810 07/30/2018   INR 0.96  05/06/2013   Chronic Kidney Disease   Stage I     GFR >90  Stage II    GFR 60-89  Stage IIIA GFR 45-59  Stage IIIB GFR 30-44  Stage IV   GFR 15-29  Stage V    GFR  <15  Lab Results  Component Value Date   CREATININE 1.73 (H) 11/04/2018   CrCl cannot be calculated (Patient's most recent lab result is older than the maximum 21 days allowed.).    Assessment / Plan:   #1 status post resection of 4.1 cm left upper lobe with negative nodes status post 4 cycles of chemo following.-Now 6 years without evidence of recurrent disease-most recent CT scan October 2020  #2  6 mm lung nodule left lower lobe-follow-up CT scan in October which will be 1 year since the previous scan  #3 stage III chronic kidney disease   [name] MD      [address] [place] [place] [address] Office [phone]   Beeper [phone]  09/11/2019 9:50 AM

## 2019-10-21 ENCOUNTER — Other Ambulatory Visit: Payer: Self-pay | Admitting: *Deleted

## 2019-10-21 ENCOUNTER — Telehealth: Payer: Self-pay | Admitting: *Deleted

## 2019-10-21 DIAGNOSIS — Z85118 Personal history of other malignant neoplasm of bronchus and lung: Secondary | ICD-10-CM

## 2019-10-21 NOTE — Telephone Encounter (Signed)
SCHEDULE AWV AFTER 10-10

## 2019-11-20 ENCOUNTER — Ambulatory Visit: Payer: Medicare Other | Admitting: Cardiothoracic Surgery

## 2019-11-27 ENCOUNTER — Ambulatory Visit
Admission: RE | Admit: 2019-11-27 | Discharge: 2019-11-27 | Disposition: A | Discharge status: Home / Self Care | Payer: Medicare Other | Source: Ambulatory Visit | Attending: Cardiothoracic Surgery | Admitting: Cardiothoracic Surgery

## 2019-11-27 ENCOUNTER — Ambulatory Visit (INDEPENDENT_AMBULATORY_CARE_PROVIDER_SITE_OTHER): Payer: Medicare Other | Admitting: Cardiothoracic Surgery

## 2019-11-27 ENCOUNTER — Other Ambulatory Visit: Payer: Self-pay

## 2019-11-27 ENCOUNTER — Encounter: Payer: Self-pay | Admitting: Cardiothoracic Surgery

## 2019-11-27 VITALS — BP 128/81 | HR 64 | Resp 18 | Ht 61.0 in

## 2019-11-27 DIAGNOSIS — I2510 Atherosclerotic heart disease of native coronary artery without angina pectoris: Secondary | ICD-10-CM | POA: Diagnosis not present

## 2019-11-27 DIAGNOSIS — C3492 Malignant neoplasm of unspecified part of left bronchus or lung: Secondary | ICD-10-CM | POA: Diagnosis not present

## 2019-11-27 DIAGNOSIS — R911 Solitary pulmonary nodule: Secondary | ICD-10-CM

## 2019-11-27 DIAGNOSIS — Z85118 Personal history of other malignant neoplasm of bronchus and lung: Secondary | ICD-10-CM

## 2019-11-27 DIAGNOSIS — I10 Essential (primary) hypertension: Secondary | ICD-10-CM | POA: Diagnosis not present

## 2019-11-27 DIAGNOSIS — J432 Centrilobular emphysema: Secondary | ICD-10-CM | POA: Diagnosis not present

## 2019-11-27 NOTE — Progress Notes (Signed)
[address]       [place] [address]             [phone]      Merranda H Borak [facility] Medical Record #[id number] Date of Birth: [birth year]  Referring: Tanda Rockers, MD Primary Care: Horald Pollen, MD  Chief Complaint:   POST OP FOLLOW UP 04/07/2013  OPERATIVE REPORT  Preop diagnosis: Left lower lobe lung mass suspicious for primary carcinoma the lung  Postop diagnosis: Same, non-small cell lung cancer probably adenocarcinoma by frozen section  Procedure:  Bronchoscopy  Left video-assisted thoracoscopy  Left lower lobectomy and lymph node dissection  Placement of On-Q device  Surgeon: [name] MD   Stage IB ( pT2a,pN0,cM0)  Diagnosis 1. Lung, resection (segmental or lobe), Left lower lobe - INVASIVE ADENOCARCINOMA, POORLY DIFFERENTIATED, SPANNING 4.1 CM. - ADENOCARCINOMA INVOLVES PLEURA. - THE SURGICAL RESECTION MARGINS ARE NEGATIVE FOR ADENOCARCINOMA. - ONE BENIGN HILAR LYMPH NODE (0/1). - SEE ONCOLOGY TABLE BELOW. 2. Lymph node, biopsy, 8 L - THERE IS NO EVIDENCE OF CARCINOMA IN 1 OF 1 LYMPH NODE (0/1). 3. Lymph node, biopsy, 11 L - THERE IS NO EVIDENCE OF CARCINOMA IN 1 OF 1 LYMPH NODE (0/1). 4. Lymph node, biopsy, 12 L - THERE IS NO EVIDENCE OF CARCINOMA IN 1 OF 1 LYMPH NODE (0/1). 5. Lymph node, biopsy, 10 L - THERE IS NO EVIDENCE OF CARCINOMA IN 1 OF 1 LYMPH NODE (0/1). 6. Lymph node, biopsy, 4 L - THERE IS NO EVIDENCE OF CARCINOMA IN 1 OF 1 LYMPH NODE (0/1).  History of Present Illness:       Patient remains active 84 years.   Following surgical resection 6  years ago.  She completed course of chemo in OCT 2015. She completed 4 cycles of adjuvant chemotherapy with navelbine and cisplatin  Follow-up CT scans were done at oncology office- October 2020-there was a 7 mm stable nodule left lower lobe. -However repeat scan on follow-up had been recommended by radiology.  Patient was told she no longer needed to come  to the oncology office.  She comes in today because of concern of her x-ray report and would like follow-up scan.     Past Medical History:  Diagnosis Date  . Arthritis   . Benign hypertensive kidney disease with chronic kidney disease stage I through stage IV, or unspecified(403.10)    sees dr. Justin Mend again in april  . Cancer ([place])    lung ca dx'd 02/2013  . Chronic kidney disease, stage III (moderate) ([facility])   . Cough 02/26/2013  . GERD (gastroesophageal reflux disease)   . Headache   . Hypertension   . Hyperthyroidism    took iodine treatment for this  . Lung mass 02/27/2013  . Neurologic disorder    2009  . Shortness of breath    exertion  . Thrombocytopenia, unspecified ([place]) 02/26/2013  . Unspecified vitamin D deficiency      Social History   Tobacco Use  Smoking Status Former Smoker  . Packs/day: 0.50  . Years: 60.00  . Pack years: 30.00  . Types: Cigarettes  . Quit date: 02/25/2012  . Years since quitting: 7.7  Smokeless Tobacco Never Used    Social History   Substance and Sexual Activity  Alcohol Use No     Allergies  Allergen Reactions  . Aspirin Other (See Comments)    REACTION: stomach upset  . Lactose Intolerance (Gi) Diarrhea and Other (  See Comments)    "can't drink milk. It tears my stomach up."  . Adhesive [Tape] Rash  . Lisinopril Other (See Comments)    cough  . Percocet [Oxycodone-Acetaminophen] Itching    Current Outpatient Medications  Medication Sig Dispense Refill  . acetaminophen (TYLENOL) 500 MG tablet Take 1 tablet (500 mg total) by mouth every 6 (six) hours as needed. 30 tablet 0  . atorvastatin (LIPITOR) 10 MG tablet Take 1 tablet (10 mg total) by mouth daily at 6 PM. 90 tablet 3  . chlorthalidone (HYGROTON) 25 MG tablet Take 1 tablet (25 mg total) by mouth daily. 90 tablet 3  . Cholecalciferol (VITAMIN D-3) 1000 units CAPS Take 1 capsule (1,000 Units total) by mouth daily. 90 capsule 1  . clopidogrel (PLAVIX) 75 MG tablet Take 1 tablet  (75 mg total) by mouth daily with breakfast. 90 tablet 3  . gabapentin (NEURONTIN) 300 MG capsule Take 300 mg by mouth daily.    . magnesium oxide (MAG-OX) 400 MG tablet Take 1 tablet (400 mg total) by mouth daily. 90 tablet 1  . metoprolol succinate (TOPROL-XL) 25 MG 24 hr tablet Take 1 tablet (25 mg) by oral route twice a day 180 tablet 3  . Multiple Vitamin (MULTIVITAMIN) tablet Take 1 tablet by mouth at bedtime.     Marland Kitchen telmisartan (MICARDIS) 40 MG tablet Take 1 tablet (40 mg total) by mouth daily. 90 tablet 3   No current facility-administered medications for this visit.       Physical Exam: BP 128/81 (BP Location: Left Arm, Patient Position: Sitting)   Pulse 64   Resp 18   Ht 5\' 1"  (1.549 m)   SpO2 97% Comment: RA with mask on  BMI 31.18 kg/m   General appearance: alert, cooperative and no distress Head: Normocephalic, without obvious abnormality, atraumatic Lymph nodes: Cervical, supraclavicular, and axillary nodes normal. Resp: clear to auscultation bilaterally Back: symmetric, no curvature. ROM normal. No CVA tenderness. GI: soft, non-tender; bowel sounds normal; no masses,  no organomegaly Extremities: extremities normal, atraumatic, no cyanosis or edema and Homans sign is negative, no sign of DVT Neurologic: Grossly normal   Diagnostic Studies & Laboratory data:     Recent Radiology Findings:  CT CHEST WO CONTRAST  Result Date: 11/27/2019 CLINICAL DATA:  Left-sided lung cancer with Vats and chemotherapy. Hypertension. Ex-smoker. EXAM: CT CHEST WITHOUT CONTRAST TECHNIQUE: Multidetector CT imaging of the chest was performed following the standard protocol without IV contrast. COMPARISON:  11/13/2018 FINDINGS: Cardiovascular: Aortic atherosclerosis. Tortuous thoracic aorta. Normal heart size, without pericardial effusion. Multivessel coronary artery atherosclerosis. Mediastinum/Nodes: Dominant left-sided thyroid nodule of 2.6 cm is similar. This has been evaluated on  previous imaging. (ref: J Am Coll Radiol. 2015 Feb;12(2): 143-50).No mediastinal or definite hilar adenopathy, given limitations of unenhanced CT. Lungs/Pleura: Left-sided pleural thickening is similar, including on 82/2. Moderate centrilobular emphysema. Status post left lower lobectomy. Volume loss and mild scarring within the inferior aspect of the left upper lobe, similar. Posterior left upper lobe 7 mm ground-glass nodule is similar, including on 78/8. Clustered nodularity within the periphery of the right upper lobe on 48/8 is unchanged. Upper Abdomen: Bilateral subcentimeter hepatic cysts. Normal imaged portions of the spleen, stomach, pancreas, gallbladder, adrenal glands, kidneys,. Abdominal aorta atherosclerosis. Musculoskeletal: Lower cervical spine fixation. Lower thoracic spondylosis. IMPRESSION: 1. Status post left lower lobectomy, without recurrent or metastatic disease. 2. Similar 7 mm left upper lobe ground-glass nodule. Recommend attention on follow-up. 3. Clustered nodularity in the right upper lobe  is likely infectious or inflammatory and not significantly changed. 4. Aortic Atherosclerosis (ICD10-I70.0) and Emphysema (ICD10-J43.9). Coronary artery atherosclerosis. Electronically Signed   By: Abigail Miyamoto M.D.   On: 11/27/2019 10:55   I have independently reviewed the above radiology studies  and reviewed the findings with the patient.   Study Result  CLINICAL DATA:  Patient with left lung cancer diagnosed in 2015 status post left lower lobe resection and chemotherapy. Follow-up evaluation.  EXAM: CT CHEST WITHOUT CONTRAST  TECHNIQUE: Multidetector CT imaging of the chest was performed following the standard protocol without IV contrast.  COMPARISON:  Chest CT 05/29/2016  FINDINGS: Cardiovascular: Normal heart size. No pericardial effusion. Thoracic aortic vascular calcifications. Coronary arterial vascular calcifications.  Mediastinum/Nodes: No enlarged axillary,  mediastinal or hilar lymphadenopathy. Small hiatal hernia. Otherwise normal appearance of the esophagus.  Lungs/Pleura: Central airways are patent. Patient status post left lower lobectomy. Stable peripheral subpleural scarring left lower hemithorax. Similar-appearing 7 mm left lower lobe nodule (image 76; series 5). Slight interval increase in size 6 mm right upper lobe nodule (image 47; series 5), previously 5 mm. Centrilobular and paraseptal emphysematous changes. No pleural effusion or pneumothorax.  Upper Abdomen: No acute process. Stable 9 mm low-attenuation lesion right hepatic lobe (image 131; series 2).  Musculoskeletal: Thoracic spine degenerative changes. No aggressive or acute appearing osseous lesions.  IMPRESSION: 1. Slight interval increase in size of right upper lobe nodule. Recommend attention on follow-up. 2. Stable appearance of the left hemithorax with left lower lobectomy. 3. Aortic Atherosclerosis (ICD10-I70.0) and Emphysema (ICD10-J43.9).   Electronically Signed   By: Lovey Newcomer M.D.   On: 05/31/2017 10:53   CLINICAL DATA:  Lung cancer.  EXAM: CT CHEST WITHOUT CONTRAST  TECHNIQUE: Multidetector CT imaging of the chest was performed following the standard protocol without IV contrast.  COMPARISON:  05/31/2015  FINDINGS: Cardiovascular: The heart size is normal. No pericardial effusion. Coronary artery calcification is noted. Atherosclerotic calcification is noted in the wall of the thoracic aorta.  Mediastinum/Nodes: No mediastinal lymphadenopathy. No evidence for gross hilar lymphadenopathy although assessment is limited by the lack of intravenous contrast on today's study. There is no axillary lymphadenopathy. Asymmetric nodular enlargement left thyroid lobe is stable. The esophagus has normal imaging features.  Lungs/Pleura: Emphysema is associated with bronchial wall thickening. 3 mm right upper lobe nodule (image 49 series  5) is stable in the interval. Volume loss left hemithorax compatible with left lower lobectomy. Scarring in the lower left lung is unchanged. No focal airspace consolidation. No pulmonary edema or pleural effusion.  Upper Abdomen: 10 mm low-density lesion posterior right liver is stable in the interval.  Musculoskeletal: Bone windows reveal no worrisome lytic or sclerotic osseous lesions.  IMPRESSION: 1. Stable exam. Status post left lower lobectomy without evidence for new or progressive findings. 2. Emphysema. 3. Thoracic aortic atherosclerosis.   Electronically Signed   By: Misty Stanley M.D.   On: 05/29/2016 15:25      CLINICAL DATA:  Restaging non small cell lung cancer. Status post left lower lobe resection. Chemotherapy complete.  EXAM: CT CHEST WITHOUT CONTRAST  TECHNIQUE: Multidetector CT imaging of the chest was performed following the standard protocol without IV contrast.  COMPARISON:  12/31/2014  FINDINGS: Mediastinum/Nodes: No breast masses, supraclavicular or axillary lymphadenopathy. Stable multinodular goiter involving the left thyroid lobe.  The heart is normal in size. No pericardial effusion. Stable aortic and coronary artery calcifications.  No mediastinal or hilar mass or adenopathy. Small scattered lymph nodes are  stable. The esophagus is grossly normal. There is a small hiatal hernia.  Lungs/Pleura: Stable emphysematous changes and pulmonary scarring. Stable surgical changes from a left lower lobe lung resection. Stable left basilar pleural and parenchymal scarring changes. No findings worrisome for recurrent tumor. No metastatic pulmonary nodules. No bronchiectasis or interstitial lung disease.  Upper abdomen: No significant findings.  Musculoskeletal: No significant findings. Stable degenerative changes involving the spine.  IMPRESSION: Status post resection of a left lower lobe lung lesion without findings for  recurrent tumor or metastatic disease.  No mediastinal or hilar mass or adenopathy.  Stable emphysematous changes but no acute pulmonary findings.  Stable atherosclerotic calcifications involving the aorta and branch vessels.  Stable left thyroid goiter.   Electronically Signed   By: Marijo Sanes M.D.   On: 05/31/2015 15:42     Ct Chest Wo Contrast  12/31/2014  CLINICAL DATA:  Subsequent evaluation of a 84 year old female with history of non-small cell carcinoma both the left lower lobe diagnosed in January 2015, status post left lower lobectomy and chemotherapy (completed). EXAM: CT CHEST WITHOUT CONTRAST TECHNIQUE: Multidetector CT imaging of the chest was performed following the standard protocol without IV contrast. COMPARISON:  Chest CT 07/23/2014. FINDINGS: Mediastinum/Lymph Nodes: Heart size is normal. There is no significant pericardial fluid, thickening or pericardial calcification. There is atherosclerosis of the thoracic aorta, the great vessels of the mediastinum and the coronary arteries, including calcified atherosclerotic plaque in the left anterior descending, left circumflex and right coronary arteries. No pathologically enlarged mediastinal or hilar lymph nodes. Please note that accurate exclusion of hilar adenopathy is limited on noncontrast CT scans. Asymmetrically enlarged left lobe of thyroid gland which appears to be occupied by a dominant partially calcified nodule, which is slightly larger than the prior examination, measuring 2.8 x 3.2 cm on today's study (image 8 of series 2). Small hiatal hernia. No axillary lymphadenopathy. Lungs/Pleura: Status post left lower lobectomy. Compensatory hyperexpansion of the left upper lobe. Small amount of peripheral pleuroparenchymal thickening in the inferior aspect of the left upper lobe, similar to the prior study, most compatible with some mild chronic scarring. No suspicious appearing pulmonary nodules or masses. No acute  consolidative airspace disease. No pleural effusions. Mild diffuse bronchial wall thickening with mild centrilobular and paraseptal emphysema. Upper Abdomen: Atherosclerosis. 4 mm low-attenuation lesion in segment 2 of the liver again noted, incompletely characterized on today's noncontrast CT examination, but likely a tiny cyst. Musculoskeletal/Soft Tissues: Orthopedic fixation hardware in the lower cervical spine incompletely imaged. There are no aggressive appearing lytic or blastic lesions noted in the visualized portions of the skeleton. IMPRESSION: 1. Postoperative changes of left lower lobectomy. No findings to suggest recurrent or metastatic disease in the thorax. 2. Slight enlargement of a heterogeneous appearing nodule in the left lobe of the thyroid gland which is partially calcified measuring 2.8 x 3.2 cm on today's study. Further evaluation with nonemergent thyroid ultrasound is recommended at this time. 3. Mild diffuse bronchial wall thickening with mild centrilobular and paraseptal emphysema; imaging findings suggestive of underlying COPD. 4. Atherosclerosis, including three-vessel coronary artery disease. Assessment for potential risk factor modification, dietary therapy or pharmacologic therapy may be warranted, if clinically indicated. Electronically Signed   By: Vinnie Langton M.D.   On: 12/31/2014 14:54     CLINICAL DATA: Lung cancer, completed chemotherapy. Shortness of breath.  EXAM: CT CHEST WITH CONTRAST  TECHNIQUE: Multidetector CT imaging of the chest was performed during intravenous contrast administration.  CONTRAST: 2mL OMNIPAQUE IOHEXOL  300 MG/ML SOLN  COMPARISON: 07/01/2013  FINDINGS: 3.1 by 2.6 cm solid and partially calcified left thyroid nodule, stable.  Left subclavian Port-A-Cath tip: SVC.  Atherosclerotic aortic arch. Coronary artery atherosclerosis observed in the left anterior descending artery.  The trace left pleural effusion has  nearly completely resolved. Focal pleural thickening or adjacent mild rounded atelectasis in the left lower lung is reduced in size and prominence on image 35 of series 2. This currently measures up to 6 mm in thickness.  No pathologic thoracic adenopathy.  Left lower lobectomy.  Indistinct 6 by 7 mm by 4 mm ground-glass density in the left lung on image 32 of series 5 posteriorly in, stable.  Thoracic spondylosis. Lower cervical plate and screw fixator.  Centrilobular emphysema.  Small focus of enhancement along the left side of the lateral segment left hepatic lobe, image 48 of series 2, stable, not previously hypermetabolic on prior PET-CT, probably flash filling of a hemangioma or a small perfusion anomaly.  IMPRESSION: 1. No findings of recurrence or significant change. 6 by 7 by 4 mm slightly sub solid left posterior nodule merits observation. 2. Stable left thyroid nodule. This was not previously hypermetabolic. 3. Stable small enhancing focus in the lateral segment left hepatic lobe, probably a flash filling hemangioma or perfusion anomaly, not previously hypermetabolic. 4. Centrilobular emphysema. 5. Left lower lobectomy. Focal scarring posteriorly in the remaining lower posterior left lung, with reduced size of the trace pleural effusion. 6. Atherosclerosis.   Electronically Signed  By: Sherryl Barters M.D.  On: 12/31/2013 15:39  Recent Lab Findings: Lab Results  Component Value Date   WBC 5.2 11/04/2018   HGB 11.3 (L) 11/04/2018   HCT 34.7 (L) 11/04/2018   PLT 236 11/04/2018   GLUCOSE 94 11/04/2018   CHOL 150 07/30/2018   TRIG 88 07/30/2018   HDL 47 07/30/2018   LDLCALC 85 07/30/2018   ALT 17 11/04/2018   AST 22 11/04/2018   NA 141 11/04/2018   K 4.5 11/04/2018   CL 111 11/04/2018   CREATININE 1.73 (H) 11/04/2018   BUN 25 (H) 11/04/2018   CO2 25 11/04/2018   TSH 1.810 07/30/2018   INR 0.96 05/06/2013   Chronic Kidney Disease    Stage I     GFR >90  Stage II    GFR 60-89  Stage IIIA GFR 45-59  Stage IIIB GFR 30-44  Stage IV   GFR 15-29  Stage V    GFR  <15  Lab Results  Component Value Date   CREATININE 1.73 (H) 11/04/2018   CrCl cannot be calculated (Patient's most recent lab result is older than the maximum 21 days allowed.).    Assessment / Plan:   #1 status post resection of 4.1 cm left upper lobe with negative nodes status post 4 cycles of chemo following.-Now 6 1/2 years without evidence of recurrent disease but does have subtle abnormalities on her CT scan that warrant further monitoring.  Plan follow-up CT of the chest without contrast in 1 year    #2 stage III chronic kidney disease   [name] MD      [address] [place] [address] Office [phone]   Beeper [phone]  11/27/2019 12:54 PM

## 2019-12-15 ENCOUNTER — Other Ambulatory Visit: Payer: Self-pay | Admitting: Emergency Medicine

## 2019-12-15 DIAGNOSIS — R918 Other nonspecific abnormal finding of lung field: Secondary | ICD-10-CM | POA: Diagnosis not present

## 2019-12-15 DIAGNOSIS — N189 Chronic kidney disease, unspecified: Secondary | ICD-10-CM | POA: Diagnosis not present

## 2019-12-15 DIAGNOSIS — I129 Hypertensive chronic kidney disease with stage 1 through stage 4 chronic kidney disease, or unspecified chronic kidney disease: Secondary | ICD-10-CM | POA: Diagnosis not present

## 2019-12-15 DIAGNOSIS — N2581 Secondary hyperparathyroidism of renal origin: Secondary | ICD-10-CM | POA: Diagnosis not present

## 2019-12-15 DIAGNOSIS — Z0000 Encounter for general adult medical examination without abnormal findings: Secondary | ICD-10-CM

## 2019-12-15 DIAGNOSIS — N184 Chronic kidney disease, stage 4 (severe): Secondary | ICD-10-CM | POA: Diagnosis not present

## 2019-12-15 DIAGNOSIS — C3490 Malignant neoplasm of unspecified part of unspecified bronchus or lung: Secondary | ICD-10-CM | POA: Diagnosis not present

## 2019-12-16 ENCOUNTER — Encounter: Payer: Self-pay | Admitting: Emergency Medicine

## 2019-12-16 ENCOUNTER — Ambulatory Visit (INDEPENDENT_AMBULATORY_CARE_PROVIDER_SITE_OTHER): Payer: Medicare Other | Admitting: Emergency Medicine

## 2019-12-16 ENCOUNTER — Other Ambulatory Visit: Payer: Self-pay

## 2019-12-16 VITALS — BP 145/62 | HR 67 | Temp 97.3°F | Ht 61.0 in | Wt 166.4 lb

## 2019-12-16 DIAGNOSIS — I129 Hypertensive chronic kidney disease with stage 1 through stage 4 chronic kidney disease, or unspecified chronic kidney disease: Secondary | ICD-10-CM

## 2019-12-16 DIAGNOSIS — C3490 Malignant neoplasm of unspecified part of unspecified bronchus or lung: Secondary | ICD-10-CM | POA: Diagnosis not present

## 2019-12-16 DIAGNOSIS — N184 Chronic kidney disease, stage 4 (severe): Secondary | ICD-10-CM | POA: Diagnosis not present

## 2019-12-16 DIAGNOSIS — M62838 Other muscle spasm: Secondary | ICD-10-CM

## 2019-12-16 MED ORDER — METHOCARBAMOL 500 MG PO TABS: 500.0000 mg | ORAL_TABLET | Freq: Every evening | ORAL | 2 refills | Status: DC | PRN

## 2019-12-16 NOTE — Patient Instructions (Addendum)
If you have lab work done today you will be contacted with your lab results within the next 2 weeks.  If you have not heard from [place] then please contact us. The fastest way to get your results is to register for My Chart.   IF you received an x-ray today, you will receive an invoice from [facility]. Please contact [facility] at [phone] with questions or concerns regarding your invoice.   IF you received labwork today, you will receive an invoice from [facility]. Please contact [facility] at [phone] with questions or concerns regarding your invoice.   Our billing staff will not be able to assist you with questions regarding bills from these companies.  You will be contacted with the lab results as soon as they are available. The fastest way to get your results is to activate your My Chart account. Instructions are located on the last page of this paperwork. If you have not heard from [place] regarding the results in 2 weeks, please contact this office.     Leg Cramps Leg cramps occur when one or more muscles tighten and you have no control over this tightening (involuntary muscle contraction). Muscle cramps can develop in any muscle, but the most common place is in the calf muscles of the leg. Those cramps can occur during exercise or when you are at rest. Leg cramps are painful, and they may last for a few seconds to a few minutes. Cramps may return several times before they finally stop. Usually, leg cramps are not caused by a serious medical problem. In many cases, the cause is not known. Some common causes include:  Excessive physical effort (overexertion), such as during intense exercise.  Overuse from repetitive motions, or doing the same thing over and over.  Staying in a certain position for a long period of time.  Improper preparation, form, or technique while performing a sport or an activity.  Dehydration.  Injury.  Side effects of certain  medicines.  Abnormally low levels of minerals in your blood (electrolytes), especially potassium and calcium. This could result from: ? Pregnancy. ? Taking diuretic medicines. Follow these instructions at home: Eating and drinking  Drink enough fluid to keep your urine pale yellow. Staying hydrated may help prevent cramps.  Eat a healthy diet that includes plenty of nutrients to help your muscles function. A healthy diet includes fruits and vegetables, lean protein, whole grains, and low-fat or nonfat dairy products. Managing pain, stiffness, and swelling      Try massaging, stretching, and relaxing the affected muscle. Do this for several minutes at a time.  If directed, put ice on areas that are sore or painful after a cramp: ? Put ice in a plastic bag. ? Place a towel between your skin and the bag. ? Leave the ice on for 20 minutes, 2-3 times a day.  If directed, apply heat to muscles that are tense or tight. Do this before you exercise, or as often as told by your health care provider. Use the heat source that your health care provider recommends, such as a moist heat pack or a heating pad. ? Place a towel between your skin and the heat source. ? Leave the heat on for 20-30 minutes. ? Remove the heat if your skin turns bright red. This is especially important if you are unable to feel pain, heat, or cold. You may have a greater risk of getting burned.  Try taking hot showers or baths to  help relax tight muscles. General instructions  If you are having frequent leg cramps, avoid intense exercise for several days.  Take over-the-counter and prescription medicines only as told by your health care provider.  Keep all follow-up visits as told by your health care provider. This is important. Contact a health care provider if:  Your leg cramps get more severe or more frequent, or they do not improve over time.  Your foot becomes cold, numb, or blue. Summary  Muscle cramps can  develop in any muscle, but the most common place is in the calf muscles of the leg.  Leg cramps are painful, and they may last for a few seconds to a few minutes.  Usually, leg cramps are not caused by a serious medical problem. Often, the cause is not known.  Stay hydrated and take over-the-counter and prescription medicines only as told by your health care provider. This information is not intended to replace advice given to you by your health care provider. Make sure you discuss any questions you have with your health care provider. Document Revised: 12/22/2016 Document Reviewed: 10/19/2016 Elsevier Patient Education  2020 [employer].

## 2019-12-16 NOTE — Progress Notes (Addendum)
[name] 84 y.o.   Chief Complaint  Patient presents with  . Follow-up    wants to talk about spasms that shes been having in the leg and feet. Wants to talk about the use of methocarbamol    HISTORY OF PRESENT ILLNESS: This is a 84 y.o. female with history of hypertension and chronic kidney disease complaining of intermittent spasms to both legs mostly at nighttime.  Here with her daughter who wants to know if methocarbamol might help because it helps her. Saw nephrologist yesterday and told everything was stable.  No problems or complications. No other complaints or medical concerns today.  HPI   Prior to Admission medications   Medication Sig Start Date End Date Taking? Authorizing Provider  acetaminophen (TYLENOL) 500 MG tablet Take 1 tablet (500 mg total) by mouth every 6 (six) hours as needed. 01/15/17  Yes Augusto Gamble B, NP  atorvastatin (LIPITOR) 10 MG tablet Take 1 tablet (10 mg total) by mouth daily at 6 PM. 06/17/19  Yes Claudy Abdallah, Ines Bloomer, MD  chlorthalidone (HYGROTON) 25 MG tablet Take 1 tablet (25 mg total) by mouth daily. 06/17/19  Yes Dreydon Cardenas, Ines Bloomer, MD  Cholecalciferol (VITAMIN D-3) 1000 units CAPS Take 1 capsule (1,000 Units total) by mouth daily. 08/02/17  Yes Forrest Moron, MD  clopidogrel (PLAVIX) 75 MG tablet Take 1 tablet (75 mg total) by mouth daily with breakfast. 06/17/19  Yes Leonda Cristo, Ines Bloomer, MD  gabapentin (NEURONTIN) 300 MG capsule Take 300 mg by mouth daily.   Yes [provider]  magnesium oxide (MAG-OX) 400 MG tablet Take 1 tablet (400 mg total) by mouth daily. 08/02/17  Yes Forrest Moron, MD  metoprolol succinate (TOPROL-XL) 25 MG 24 hr tablet Take 1 tablet (25 mg) by oral route twice a day 06/17/19  Yes Marjorie Deprey, Ines Bloomer, MD  Multiple Vitamin (MULTIVITAMIN) tablet Take 1 tablet by mouth at bedtime.    Yes [provider]  telmisartan (MICARDIS) 40 MG tablet Take 1 tablet (40 mg total) by mouth daily. 06/17/19   Yes Horald Pollen, MD    Allergies  Allergen Reactions  . Aspirin Other (See Comments)    REACTION: stomach upset  . Lactose Intolerance (Gi) Diarrhea and Other (See Comments)    "can't drink milk. It tears my stomach up."  . Adhesive [Tape] Rash  . Lisinopril Other (See Comments)    cough  . Percocet [Oxycodone-Acetaminophen] Itching    Patient Active Problem List   Diagnosis Date Noted  . Sacroiliac joint disease 08/02/2017  . History of TIA (transient ischemic attack) 08/02/2017  . Vitamin D deficiency 08/02/2017  . Arthritis 08/02/2017  . Long term current use of anticoagulant therapy 08/02/2017  . Left thyroid nodule 01/01/2015  . Benign hypertension with CKD (chronic kidney disease) stage IV ([place]) 11/06/2013  . Anemia in chronic kidney disease 05/28/2013  . History of lung cancer 04/07/2013  . Essential hypertension, benign 03/05/2013  . Personal history of colonic polyps 10/29/2012    Past Medical History:  Diagnosis Date  . Arthritis   . Benign hypertensive kidney disease with chronic kidney disease stage I through stage IV, or unspecified(403.10)    sees dr. Justin Mend again in april  . Cancer ([place])    lung ca dx'd 02/2013  . Chronic kidney disease, stage III (moderate) (HCC)   . Cough 02/26/2013  . GERD (gastroesophageal reflux disease)   . Headache   . Hypertension   . Hyperthyroidism    took  iodine treatment for this  . Lung mass 02/27/2013  . Neurologic disorder    2009  . Shortness of breath    exertion  . Thrombocytopenia, unspecified ([place]) 02/26/2013  . Unspecified vitamin D deficiency     Past Surgical History:  Procedure Laterality Date  . BACK SURGERY    . EYE SURGERY     cataract  . EYE SURGERY Left    open up tear duct  . LYMPH NODE DISSECTION Left 04/07/2013   Procedure: LYMPH NODE DISSECTION;  Surgeon: [name], MD;  Location: [facility];  Service: Thoracic;  Laterality: Left;  . NECK SURGERY    . PORT-A-CATH REMOVAL Left 01/09/2014    Procedure: REMOVAL PORT-A-CATH;  Surgeon: [name], MD;  Location: [facility];  Service: Thoracic;  Laterality: Left;  . PORTACATH PLACEMENT Left 05/06/2013   Procedure: INSERTION PORT-A-CATH;  Surgeon: [name], MD;  Location: [facility];  Service: Thoracic;  Laterality: Left;  Marland Kitchen VIDEO ASSISTED THORACOSCOPY (VATS)/ LOBECTOMY Left 04/07/2013   Procedure: VIDEO ASSISTED THORACOSCOPY (VATS)/ LOBECTOMY; INSERTION OF ON-Q PUMP;  Surgeon: [name], MD;  Location: [facility];  Service: Thoracic;  Laterality: Left;  Marland Kitchen VIDEO BRONCHOSCOPY N/A 04/07/2013   Procedure: VIDEO BRONCHOSCOPY;  Surgeon: [name], MD;  Location: [facility];  Service: Thoracic;  Laterality: N/A;    Social History   Socioeconomic History  . Marital status: Single    Spouse name: Not on file  . Number of children: 2  . Years of education: Not on file  . Highest education level: Not on file  Occupational History  . Not on file  Tobacco Use  . Smoking status: Former Smoker    Packs/day: 0.50    Years: 60.00    Pack years: 30.00    Types: Cigarettes    Quit date: 02/25/2012    Years since quitting: 7.8  . Smokeless tobacco: Never Used  Substance and Sexual Activity  . Alcohol use: No  . Drug use: No  . Sexual activity: Not on file  Other Topics Concern  . Not on file  Social History Narrative  . Not on file   Social Determinants of Health   Financial Resource Strain:   . Difficulty of Paying Living Expenses: Not on file  Food Insecurity:   . Worried About Charity fundraiser in the Last Year: Not on file  . Ran Out of Food in the Last Year: Not on file  Transportation Needs:   . Lack of Transportation (Medical): Not on file  . Lack of Transportation (Non-Medical): Not on file  Physical Activity:   . Days of Exercise per Week: Not on file  . Minutes of Exercise per Session: Not on file  Stress:   . Feeling of Stress : Not on file  Social Connections:   . Frequency of Communication with  Friends and Family: Not on file  . Frequency of Social Gatherings with Friends and Family: Not on file  . Attends Religious Services: Not on file  . Active Member of Clubs or Organizations: Not on file  . Attends Archivist Meetings: Not on file  . Marital Status: Not on file  Intimate Partner Violence:   . Fear of Current or Ex-Partner: Not on file  . Emotionally Abused: Not on file  . Physically Abused: Not on file  . Sexually Abused: Not on file    Family History  Problem Relation Age of Onset  . Lung cancer  Brother        smoked  . Breast cancer Mother   . Lung cancer Father        smoked  . Rectal cancer Neg Hx   . Stomach cancer Neg Hx   . Esophageal cancer Neg Hx   . Colon cancer Neg Hx      Review of Systems  Constitutional: Negative.  Negative for chills and fever.  HENT: Negative.  Negative for congestion and sore throat.   Respiratory: Negative.  Negative for cough and shortness of breath.   Cardiovascular: Negative for chest pain and palpitations.  Gastrointestinal: Negative.  Negative for abdominal pain, nausea and vomiting.  Genitourinary: Negative.  Negative for dysuria and hematuria.  Skin: Negative.  Negative for rash.  Neurological: Negative.  Negative for dizziness and headaches.  All other systems reviewed and are negative.   Today's Vitals   12/16/19 1325  BP: (!) 145/62  Pulse: 67  Temp: (!) 97.3 F (36.3 C)  SpO2: 97%  Weight: 166 lb 6.4 oz (75.5 kg)  Height: 5\' 1"  (1.549 m)   Body mass index is 31.44 kg/m.  Physical Exam Vitals reviewed.  HENT:     Head: Normocephalic.  Eyes:     Extraocular Movements: Extraocular movements intact.     Pupils: Pupils are equal, round, and reactive to light.  Cardiovascular:     Rate and Rhythm: Normal rate and regular rhythm.     Pulses: Normal pulses.     Heart sounds: Normal heart sounds.  Pulmonary:     Effort: Pulmonary effort is normal.     Breath sounds: Normal breath sounds.    Musculoskeletal:     Cervical back: Normal range of motion and neck supple.     Right lower leg: Normal.     Left lower leg: Normal.     Right foot: Normal.     Left foot: Normal.  Skin:    General: Skin is warm and dry.     Capillary Refill: Capillary refill takes less than 2 seconds.  Neurological:     General: No focal deficit present.     Mental Status: She is alert and oriented to person, place, and time.  Psychiatric:        Mood and Affect: Mood normal.        Behavior: Behavior normal.      ASSESSMENT & PLAN: Clinically stable.  No medical concerns identified during this visit.  Continue present medications.  No changes. Follow-up in 6 months, earlier as needed. Neilah was seen today for follow-up.  Diagnoses and all orders for this visit:  Muscle spasms of both lower extremities -     methocarbamol (ROBAXIN) 500 MG tablet; Take 1 tablet (500 mg total) by mouth at bedtime as needed for muscle spasms.  Benign hypertension with CKD (chronic kidney disease) stage IV (HCC)  Malignant neoplasm of unspecified part of unspecified bronchus or lung (HCC)  Chronic kidney disease, stage IV (severe) ([place])     Patient Instructions       If you have lab work done today you will be contacted with your lab results within the next 2 weeks.  If you have not heard from [place] then please contact us. The fastest way to get your results is to register for My Chart.   IF you received an x-ray today, you will receive an invoice from [facility]. Please contact [facility] at [phone] with questions or concerns regarding your invoice.  IF you received labwork today, you will receive an invoice from [facility]. Please contact [facility] at [phone] with questions or concerns regarding your invoice.   Our billing staff will not be able to assist you with questions regarding bills from these companies.  You will be contacted with the lab results as soon as they  are available. The fastest way to get your results is to activate your My Chart account. Instructions are located on the last page of this paperwork. If you have not heard from [place] regarding the results in 2 weeks, please contact this office.     Leg Cramps Leg cramps occur when one or more muscles tighten and you have no control over this tightening (involuntary muscle contraction). Muscle cramps can develop in any muscle, but the most common place is in the calf muscles of the leg. Those cramps can occur during exercise or when you are at rest. Leg cramps are painful, and they may last for a few seconds to a few minutes. Cramps may return several times before they finally stop. Usually, leg cramps are not caused by a serious medical problem. In many cases, the cause is not known. Some common causes include:  Excessive physical effort (overexertion), such as during intense exercise.  Overuse from repetitive motions, or doing the same thing over and over.  Staying in a certain position for a long period of time.  Improper preparation, form, or technique while performing a sport or an activity.  Dehydration.  Injury.  Side effects of certain medicines.  Abnormally low levels of minerals in your blood (electrolytes), especially potassium and calcium. This could result from: ? Pregnancy. ? Taking diuretic medicines. Follow these instructions at home: Eating and drinking  Drink enough fluid to keep your urine pale yellow. Staying hydrated may help prevent cramps.  Eat a healthy diet that includes plenty of nutrients to help your muscles function. A healthy diet includes fruits and vegetables, lean protein, whole grains, and low-fat or nonfat dairy products. Managing pain, stiffness, and swelling      Try massaging, stretching, and relaxing the affected muscle. Do this for several minutes at a time.  If directed, put ice on areas that are sore or painful after a cramp: ? Put ice in a  plastic bag. ? Place a towel between your skin and the bag. ? Leave the ice on for 20 minutes, 2-3 times a day.  If directed, apply heat to muscles that are tense or tight. Do this before you exercise, or as often as told by your health care provider. Use the heat source that your health care provider recommends, such as a moist heat pack or a heating pad. ? Place a towel between your skin and the heat source. ? Leave the heat on for 20-30 minutes. ? Remove the heat if your skin turns bright red. This is especially important if you are unable to feel pain, heat, or cold. You may have a greater risk of getting burned.  Try taking hot showers or baths to help relax tight muscles. General instructions  If you are having frequent leg cramps, avoid intense exercise for several days.  Take over-the-counter and prescription medicines only as told by your health care provider.  Keep all follow-up visits as told by your health care provider. This is important. Contact a health care provider if:  Your leg cramps get more severe or more frequent, or they do not improve over time.  Your foot becomes cold, numb,  or blue. Summary  Muscle cramps can develop in any muscle, but the most common place is in the calf muscles of the leg.  Leg cramps are painful, and they may last for a few seconds to a few minutes.  Usually, leg cramps are not caused by a serious medical problem. Often, the cause is not known.  Stay hydrated and take over-the-counter and prescription medicines only as told by your health care provider. This information is not intended to replace advice given to you by your health care provider. Make sure you discuss any questions you have with your health care provider. Document Revised: 12/22/2016 Document Reviewed: 10/19/2016 Elsevier Patient Education  2020 Elsevier Inc.      Agustina Caroli, MD [facility]

## 2020-01-20 DIAGNOSIS — H16142 Punctate keratitis, left eye: Secondary | ICD-10-CM | POA: Diagnosis not present

## 2020-01-20 DIAGNOSIS — H43812 Vitreous degeneration, left eye: Secondary | ICD-10-CM | POA: Diagnosis not present

## 2020-01-20 DIAGNOSIS — H5203 Hypermetropia, bilateral: Secondary | ICD-10-CM | POA: Diagnosis not present

## 2020-01-20 DIAGNOSIS — H16223 Keratoconjunctivitis sicca, not specified as Sjogren's, bilateral: Secondary | ICD-10-CM | POA: Diagnosis not present

## 2020-01-20 DIAGNOSIS — H52223 Regular astigmatism, bilateral: Secondary | ICD-10-CM | POA: Diagnosis not present

## 2020-01-29 ENCOUNTER — Ambulatory Visit: Payer: Medicare Other

## 2020-02-25 ENCOUNTER — Ambulatory Visit
Admission: RE | Admit: 2020-02-25 | Discharge: 2020-02-25 | Disposition: A | Discharge status: Home / Self Care | Payer: Medicare Other | Source: Ambulatory Visit | Attending: Emergency Medicine | Admitting: Emergency Medicine

## 2020-02-25 ENCOUNTER — Other Ambulatory Visit: Payer: Self-pay

## 2020-02-25 DIAGNOSIS — Z1231 Encounter for screening mammogram for malignant neoplasm of breast: Secondary | ICD-10-CM | POA: Diagnosis not present

## 2020-02-25 DIAGNOSIS — Z0000 Encounter for general adult medical examination without abnormal findings: Secondary | ICD-10-CM

## 2020-03-09 ENCOUNTER — Other Ambulatory Visit: Payer: Self-pay | Admitting: Emergency Medicine

## 2020-03-09 DIAGNOSIS — M62838 Other muscle spasm: Secondary | ICD-10-CM

## 2020-03-09 DIAGNOSIS — H16223 Keratoconjunctivitis sicca, not specified as Sjogren's, bilateral: Secondary | ICD-10-CM | POA: Diagnosis not present

## 2020-03-09 NOTE — Telephone Encounter (Signed)
Requested medication (s) are due for refill today:   Provider to determine  Requested medication (s) are on the active medication list:   Yes  Future visit scheduled:   Yes   Last ordered: 12/16/2019 #30, 2 refills  Clinic note:   Returned because this is a non delegated refill per the protocol.   Requested Prescriptions  Pending Prescriptions Disp Refills   methocarbamol (ROBAXIN) 500 MG tablet [Pharmacy Med Name: METHOCARBAMOL 500 MG TABLET] 30 tablet 2    Sig: TAKE 1 TABLET BY MOUTH AT BEDTIME AS NEEDED FOR MUSCLE SPASMS.      Not Delegated - Analgesics:  Muscle Relaxants Failed - 03/09/2020 11:17 AM      Failed - This refill cannot be delegated      Passed - Valid encounter within last 6 months    Recent Outpatient Visits           2 months ago Muscle spasms of both lower extremities   Primary Care at [facility], Ines Bloomer, MD   8 months ago Encounter to establish care   Primary Care at [facility], Ines Bloomer, MD   1 year ago Essential hypertension, benign   Primary Care at [facility], Arlie Solomons, MD   2 years ago Encounter for Medicare annual wellness exam   Primary Care at [place], MD   2 years ago Anemia in stage 4 chronic kidney disease [facility])   Primary Care at [facility], Arlie Solomons, MD       Future Appointments             In 3 months Sagardia, Ines Bloomer, MD Primary Care at [facility]

## 2020-04-21 DIAGNOSIS — N189 Chronic kidney disease, unspecified: Secondary | ICD-10-CM | POA: Diagnosis not present

## 2020-04-21 DIAGNOSIS — I129 Hypertensive chronic kidney disease with stage 1 through stage 4 chronic kidney disease, or unspecified chronic kidney disease: Secondary | ICD-10-CM | POA: Diagnosis not present

## 2020-04-21 DIAGNOSIS — R918 Other nonspecific abnormal finding of lung field: Secondary | ICD-10-CM | POA: Diagnosis not present

## 2020-04-21 DIAGNOSIS — N184 Chronic kidney disease, stage 4 (severe): Secondary | ICD-10-CM | POA: Diagnosis not present

## 2020-04-21 DIAGNOSIS — N2581 Secondary hyperparathyroidism of renal origin: Secondary | ICD-10-CM | POA: Diagnosis not present

## 2020-04-21 DIAGNOSIS — C3490 Malignant neoplasm of unspecified part of unspecified bronchus or lung: Secondary | ICD-10-CM | POA: Diagnosis not present

## 2020-06-10 ENCOUNTER — Other Ambulatory Visit: Payer: Self-pay | Admitting: Emergency Medicine

## 2020-06-10 DIAGNOSIS — M62838 Other muscle spasm: Secondary | ICD-10-CM

## 2020-06-14 ENCOUNTER — Other Ambulatory Visit: Payer: Self-pay

## 2020-06-14 ENCOUNTER — Ambulatory Visit (INDEPENDENT_AMBULATORY_CARE_PROVIDER_SITE_OTHER): Payer: Medicare Other | Admitting: Emergency Medicine

## 2020-06-14 ENCOUNTER — Encounter: Payer: Self-pay | Admitting: Emergency Medicine

## 2020-06-14 ENCOUNTER — Other Ambulatory Visit: Payer: Self-pay | Admitting: Emergency Medicine

## 2020-06-14 VITALS — BP 126/76 | HR 62 | Temp 98.5°F | Ht 61.0 in | Wt 167.0 lb

## 2020-06-14 DIAGNOSIS — I2510 Atherosclerotic heart disease of native coronary artery without angina pectoris: Secondary | ICD-10-CM | POA: Diagnosis not present

## 2020-06-14 DIAGNOSIS — N184 Chronic kidney disease, stage 4 (severe): Secondary | ICD-10-CM

## 2020-06-14 DIAGNOSIS — I129 Hypertensive chronic kidney disease with stage 1 through stage 4 chronic kidney disease, or unspecified chronic kidney disease: Secondary | ICD-10-CM | POA: Diagnosis not present

## 2020-06-14 DIAGNOSIS — Z85118 Personal history of other malignant neoplasm of bronchus and lung: Secondary | ICD-10-CM

## 2020-06-14 DIAGNOSIS — J439 Emphysema, unspecified: Secondary | ICD-10-CM

## 2020-06-14 DIAGNOSIS — E041 Nontoxic single thyroid nodule: Secondary | ICD-10-CM

## 2020-06-14 DIAGNOSIS — I700 Atherosclerosis of aorta: Secondary | ICD-10-CM | POA: Diagnosis not present

## 2020-06-14 DIAGNOSIS — Z8639 Personal history of other endocrine, nutritional and metabolic disease: Secondary | ICD-10-CM

## 2020-06-14 DIAGNOSIS — D631 Anemia in chronic kidney disease: Secondary | ICD-10-CM

## 2020-06-14 DIAGNOSIS — Z7901 Long term (current) use of anticoagulants: Secondary | ICD-10-CM

## 2020-06-14 DIAGNOSIS — Z8673 Personal history of transient ischemic attack (TIA), and cerebral infarction without residual deficits: Secondary | ICD-10-CM

## 2020-06-14 NOTE — Assessment & Plan Note (Signed)
Nutrition assessed.  Continue atorvastatin 10 mg daily.

## 2020-06-14 NOTE — Patient Instructions (Signed)
Health Maintenance After Age 83 After age 83, you are at a higher risk for certain long-term diseases and infections as well as injuries from falls. Falls are a major cause of broken bones and head injuries in people who are older than age 83. Getting regular preventive care can help to keep you healthy and well. Preventive care includes getting regular testing and making lifestyle changes as recommended by your health care provider. Talk with your health care provider about:  Which screenings and tests you should have. A screening is a test that checks for a disease when you have no symptoms.  A diet and exercise plan that is right for you. What should I know about screenings and tests to prevent falls? Screening and testing are the best ways to find a health problem early. Early diagnosis and treatment give you the best chance of managing medical conditions that are common after age 83. Certain conditions and lifestyle choices may make you more likely to have a fall. Your health care provider may recommend:  Regular vision checks. Poor vision and conditions such as cataracts can make you more likely to have a fall. If you wear glasses, make sure to get your prescription updated if your vision changes.  Medicine review. Work with your health care provider to regularly review all of the medicines you are taking, including over-the-counter medicines. Ask your health care provider about any side effects that may make you more likely to have a fall. Tell your health care provider if any medicines that you take make you feel dizzy or sleepy.  Osteoporosis screening. Osteoporosis is a condition that causes the bones to get weaker. This can make the bones weak and cause them to break more easily.  Blood pressure screening. Blood pressure changes and medicines to control blood pressure can make you feel dizzy.  Strength and balance checks. Your health care provider may recommend certain tests to check your  strength and balance while standing, walking, or changing positions.  Foot health exam. Foot pain and numbness, as well as not wearing proper footwear, can make you more likely to have a fall.  Depression screening. You may be more likely to have a fall if you have a fear of falling, feel emotionally low, or feel unable to do activities that you used to do.  Alcohol use screening. Using too much alcohol can affect your balance and may make you more likely to have a fall. What actions can I take to lower my risk of falls? General instructions  Talk with your health care provider about your risks for falling. Tell your health care provider if: ? You fall. Be sure to tell your health care provider about all falls, even ones that seem minor. ? You feel dizzy, sleepy, or off-balance.  Take over-the-counter and prescription medicines only as told by your health care provider. These include any supplements.  Eat a healthy diet and maintain a healthy weight. A healthy diet includes low-fat dairy products, low-fat (lean) meats, and fiber from whole grains, beans, and lots of fruits and vegetables. Home safety  Remove any tripping hazards, such as rugs, cords, and clutter.  Install safety equipment such as grab bars in bathrooms and safety rails on stairs.  Keep rooms and walkways well-lit. Activity  Follow a regular exercise program to stay fit. This will help you maintain your balance. Ask your health care provider what types of exercise are appropriate for you.  If you need a cane or walker,   use it as recommended by your health care provider.  Wear supportive shoes that have nonskid soles.   Lifestyle  Do not drink alcohol if your health care provider tells you not to drink.  If you drink alcohol, limit how much you have: ? 0-1 drink a day for women. ? 0-2 drinks a day for men.  Be aware of how much alcohol is in your drink. In the [place] one drink equals one typical bottle of beer (12  oz), one-half glass of wine (5 oz), or one shot of hard liquor (1 oz).  Do not use any products that contain nicotine or tobacco, such as cigarettes and e-cigarettes. If you need help quitting, ask your health care provider. Summary  Having a healthy lifestyle and getting preventive care can help to protect your health and wellness after age 83.  Screening and testing are the best way to find a health problem early and help you avoid having a fall. Early diagnosis and treatment give you the best chance for managing medical conditions that are more common for people who are older than age 83.  Falls are a major cause of broken bones and head injuries in people who are older than age 83. Take precautions to prevent a fall at home.  Work with your health care provider to learn what changes you can make to improve your health and wellness and to prevent falls. This information is not intended to replace advice given to you by your health care provider. Make sure you discuss any questions you have with your health care provider. Document Revised: 05/02/2018 Document Reviewed: 11/22/2016 Elsevier Patient Education  2021 Elsevier Inc.  

## 2020-06-14 NOTE — Assessment & Plan Note (Signed)
Well-controlled hypertension.  Continue chlorthalidone 25 mg daily, Micardis 40 mg daily, and metoprolol succinate 25 mg daily. Stable chronic kidney disease.  Sees nephrologist on a regular basis.  No dialysis.

## 2020-06-14 NOTE — Assessment & Plan Note (Signed)
Addressed the need to continue Plavix 75 mg daily.  Fall precautions given.

## 2020-06-14 NOTE — Assessment & Plan Note (Signed)
No anginal symptoms.  Continue Plavix 75 mg daily and beta-blocker metoprolol succinate 25 mg daily.

## 2020-06-14 NOTE — Progress Notes (Signed)
[name] 85 y.o.   Chief Complaint  Patient presents with  . Follow-up    Pt here for 6 month check up, pt states she would like a refill of robaxin.    HISTORY OF PRESENT ILLNESS: A1A This is a 85 y.o. female here for follow-up of multiple chronic medical problems. Has the following chronic medical problems: 1.  History of lung cancer: Cancer free.  Status post left lobectomy followed by chemotherapy. 2.  Hypertension with chronic kidney disease.  Sees renal doctor on a regular basis.  Doing well.  No concerns. 3.  History of TIA in the past.  Allergic to aspirin.  On Plavix. 4.  History of arthritis and occasional neuropathic pain to her legs.  Takes gabapentin at bedtime. 5.  Dyslipidemia: On atorvastatin 10 mg daily. No complaints or medical concerns today. Requesting refill of Robaxin which I sent yesterday to pharmacy of record.  HPI   Prior to Admission medications   Medication Sig Start Date End Date Taking? Authorizing Provider  acetaminophen (TYLENOL) 500 MG tablet Take 1 tablet (500 mg total) by mouth every 6 (six) hours as needed. 01/15/17  Yes Augusto Gamble B, NP  atorvastatin (LIPITOR) 10 MG tablet Take 1 tablet (10 mg total) by mouth daily at 6 PM. 06/17/19  Yes Addison Whidbee, Ines Bloomer, MD  chlorthalidone (HYGROTON) 25 MG tablet Take 1 tablet (25 mg total) by mouth daily. 06/17/19  Yes Priscilla Kirstein, Ines Bloomer, MD  Cholecalciferol (VITAMIN D-3) 1000 units CAPS Take 1 capsule (1,000 Units total) by mouth daily. 08/02/17  Yes Forrest Moron, MD  clopidogrel (PLAVIX) 75 MG tablet Take 1 tablet (75 mg total) by mouth daily with breakfast. 06/17/19  Yes Noya Santarelli, Ines Bloomer, MD  gabapentin (NEURONTIN) 300 MG capsule Take 300 mg by mouth daily.   Yes [provider]  magnesium oxide (MAG-OX) 400 MG tablet Take 1 tablet (400 mg total) by mouth daily. 08/02/17  Yes Stallings, Zoe A, MD  methocarbamol (ROBAXIN) 500 MG tablet TAKE 1 TABLET BY MOUTH AT BEDTIME AS NEEDED  FOR MUSCLE SPASMS. 06/13/20  Yes Niemah Schwebke, Ines Bloomer, MD  metoprolol succinate (TOPROL-XL) 25 MG 24 hr tablet Take 1 tablet (25 mg) by oral route twice a day 06/17/19  Yes Navaya Wiatrek, Ines Bloomer, MD  Multiple Vitamin (MULTIVITAMIN) tablet Take 1 tablet by mouth at bedtime.    Yes [provider]  telmisartan (MICARDIS) 40 MG tablet Take 1 tablet (40 mg total) by mouth daily. 06/17/19  Yes Horald Pollen, MD    Allergies  Allergen Reactions  . Aspirin Other (See Comments)    REACTION: stomach upset  . Lactose Intolerance (Gi) Diarrhea and Other (See Comments)    "can't drink milk. It tears my stomach up."  . Adhesive [Tape] Rash  . Lisinopril Other (See Comments)    cough  . Percocet [Oxycodone-Acetaminophen] Itching    Patient Active Problem List   Diagnosis Date Noted  . Malignant neoplasm of unspecified part of unspecified bronchus or lung ([place]) 12/16/2019  . Sacroiliac joint disease 08/02/2017  . History of TIA (transient ischemic attack) 08/02/2017  . Vitamin D deficiency 08/02/2017  . Arthritis 08/02/2017  . Long term current use of anticoagulant therapy 08/02/2017  . Left thyroid nodule 01/01/2015  . Benign hypertension with CKD (chronic kidney disease) stage IV ([place]) 11/06/2013  . Anemia in chronic kidney disease 05/28/2013  . History of lung cancer 04/07/2013  . Essential hypertension, benign 03/05/2013  . Personal history of colonic  polyps 10/29/2012    Past Medical History:  Diagnosis Date  . Arthritis   . Benign hypertensive kidney disease with chronic kidney disease stage I through stage IV, or unspecified(403.10)    sees dr. Justin Mend again in april  . Cancer ([place])    lung ca dx'd 02/2013  . Chronic kidney disease, stage III (moderate) (HCC)   . Cough 02/26/2013  . GERD (gastroesophageal reflux disease)   . Headache   . Hypertension   . Hyperthyroidism    took iodine treatment for this  . Lung mass 02/27/2013  . Neurologic disorder    2009  .  Shortness of breath    exertion  . Thrombocytopenia, unspecified ([place]) 02/26/2013  . Unspecified vitamin D deficiency     Past Surgical History:  Procedure Laterality Date  . BACK SURGERY    . EYE SURGERY     cataract  . EYE SURGERY Left    open up tear duct  . LYMPH NODE DISSECTION Left 04/07/2013   Procedure: LYMPH NODE DISSECTION;  Surgeon: [name], MD;  Location: [place];  Service: Thoracic;  Laterality: Left;  . NECK SURGERY    . PORT-A-CATH REMOVAL Left 01/09/2014   Procedure: REMOVAL PORT-A-CATH;  Surgeon: [name], MD;  Location: [place];  Service: Thoracic;  Laterality: Left;  . PORTACATH PLACEMENT Left 05/06/2013   Procedure: INSERTION PORT-A-CATH;  Surgeon: [name], MD;  Location: [place];  Service: Thoracic;  Laterality: Left;  Marland Kitchen VIDEO ASSISTED THORACOSCOPY (VATS)/ LOBECTOMY Left 04/07/2013   Procedure: VIDEO ASSISTED THORACOSCOPY (VATS)/ LOBECTOMY; INSERTION OF ON-Q PUMP;  Surgeon: [name], MD;  Location: [place];  Service: Thoracic;  Laterality: Left;  Marland Kitchen VIDEO BRONCHOSCOPY N/A 04/07/2013   Procedure: VIDEO BRONCHOSCOPY;  Surgeon: [name], MD;  Location[facility];  Service: Thoracic;  Laterality: N/A;    Social History   Socioeconomic History  . Marital status: Single    Spouse name: Not on file  . Number of children: 2  . Years of education: Not on file  . Highest education level: Not on file  Occupational History  . Not on file  Tobacco Use  . Smoking status: Former Smoker    Packs/day: 0.50    Years: 60.00    Pack years: 30.00    Types: Cigarettes    Quit date: 02/25/2012    Years since quitting: 8.3  . Smokeless tobacco: Never Used  Substance and Sexual Activity  . Alcohol use: No  . Drug use: No  . Sexual activity: Not on file  Other Topics Concern  . Not on file  Social History Narrative  . Not on file   Social Determinants of Health   Financial Resource Strain: Not on file  Food Insecurity: Not on file   Transportation Needs: Not on file  Physical Activity: Not on file  Stress: Not on file  Social Connections: Not on file  Intimate Partner Violence: Not on file    Family History  Problem Relation Age of Onset  . Lung cancer Brother        smoked  . Breast cancer Mother   . Lung cancer Father        smoked  . Rectal cancer Neg Hx   . Stomach cancer Neg Hx   . Esophageal cancer Neg Hx   . Colon cancer Neg Hx      Review of Systems  Constitutional: Negative.  Negative for chills and fever.  HENT: Negative.  Negative for congestion and sore throat.   Respiratory: Negative.  Negative for cough and shortness of breath.   Cardiovascular: Negative.  Negative for chest pain and palpitations.  Gastrointestinal: Negative.  Negative for abdominal pain, diarrhea, nausea and vomiting.  Genitourinary: Negative.  Negative for dysuria.  Skin: Negative.  Negative for rash.  Neurological: Negative.  Negative for dizziness and headaches.  All other systems reviewed and are negative.  Today's Vitals   06/14/20 1336  BP: 126/76  Pulse: 62  Temp: 98.5 F (36.9 C)  TempSrc: Oral  SpO2: 97%  Weight: 167 lb (75.8 kg)  Height: 5\' 1"  (1.549 m)   Body mass index is 31.55 kg/m. Wt Readings from Last 3 Encounters:  06/14/20 167 lb (75.8 kg)  12/16/19 166 lb 6.4 oz (75.5 kg)  09/11/19 165 lb (74.8 kg)     Physical Exam Vitals reviewed.  Constitutional:      Appearance: Normal appearance.  HENT:     Head: Normocephalic.  Eyes:     Extraocular Movements: Extraocular movements intact.     Pupils: Pupils are equal, round, and reactive to light.  Cardiovascular:     Rate and Rhythm: Normal rate and regular rhythm.     Pulses: Normal pulses.     Heart sounds: Normal heart sounds.  Pulmonary:     Effort: Pulmonary effort is normal.     Breath sounds: Normal breath sounds.  Musculoskeletal:        General: Normal range of motion.     Cervical back: Normal range of motion and neck  supple.  Skin:    General: Skin is warm and dry.     Capillary Refill: Capillary refill takes less than 2 seconds.  Neurological:     General: No focal deficit present.     Mental Status: She is alert and oriented to person, place, and time.  Psychiatric:        Mood and Affect: Mood normal.        Behavior: Behavior normal.      ASSESSMENT & PLAN: A total of 30 minutes was spent with the patient and counseling/coordination of care regarding treatment of multiple chronic medical problems and cardiovascular risks associated with these conditions, education on nutrition, review of all medications, review of most recent office visit notes, review of most recent blood work results, health maintenance items, fall precautions and increased risk of internal bleeding if such fall occurs, prognosis, documentation and need for follow-up.  Benign hypertension with CKD (chronic kidney disease) stage IV (HCC) Well-controlled hypertension.  Continue chlorthalidone 25 mg daily, Micardis 40 mg daily, and metoprolol succinate 25 mg daily. Stable chronic kidney disease.  Sees nephrologist on a regular basis.  No dialysis.  History of TIA (transient ischemic attack) Addressed the need to continue Plavix 75 mg daily.  Fall precautions given.  Atherosclerosis of native coronary artery of native heart without angina pectoris No anginal symptoms.  Continue Plavix 75 mg daily and beta-blocker metoprolol succinate 25 mg daily.  Atherosclerosis of aorta ([place]) Nutrition assessed.  Continue atorvastatin 10 mg daily.  Nyna was seen today for follow-up.  Diagnoses and all orders for this visit:  Benign hypertension with CKD (chronic kidney disease) stage IV (HCC)  Atherosclerosis of aorta (HCC)  Pulmonary emphysema, unspecified emphysema type ([place])  Atherosclerosis of native coronary artery of native heart without angina pectoris  Long term current use of anticoagulant therapy  History of TIA  (transient ischemic attack)  History of lung cancer  Anemia in stage 4 chronic kidney disease (HCC)  Left thyroid nodule    Patient Instructions   Health Maintenance After Age 49 After age 46, you are at a higher risk for certain long-term diseases and infections as well as injuries from falls. Falls are a major cause of broken bones and head injuries in people who are older than age 15. Getting regular preventive care can help to keep you healthy and well. Preventive care includes getting regular testing and making lifestyle changes as recommended by your health care provider. Talk with your health care provider about:  Which screenings and tests you should have. A screening is a test that checks for a disease when you have no symptoms.  A diet and exercise plan that is right for you. What should I know about screenings and tests to prevent falls? Screening and testing are the best ways to find a health problem early. Early diagnosis and treatment give you the best chance of managing medical conditions that are common after age 14. Certain conditions and lifestyle choices may make you more likely to have a fall. Your health care provider may recommend:  Regular vision checks. Poor vision and conditions such as cataracts can make you more likely to have a fall. If you wear glasses, make sure to get your prescription updated if your vision changes.  Medicine review. Work with your health care provider to regularly review all of the medicines you are taking, including over-the-counter medicines. Ask your health care provider about any side effects that may make you more likely to have a fall. Tell your health care provider if any medicines that you take make you feel dizzy or sleepy.  Osteoporosis screening. Osteoporosis is a condition that causes the bones to get weaker. This can make the bones weak and cause them to break more easily.  Blood pressure screening. Blood pressure changes and  medicines to control blood pressure can make you feel dizzy.  Strength and balance checks. Your health care provider may recommend certain tests to check your strength and balance while standing, walking, or changing positions.  Foot health exam. Foot pain and numbness, as well as not wearing proper footwear, can make you more likely to have a fall.  Depression screening. You may be more likely to have a fall if you have a fear of falling, feel emotionally low, or feel unable to do activities that you used to do.  Alcohol use screening. Using too much alcohol can affect your balance and may make you more likely to have a fall. What actions can I take to lower my risk of falls? General instructions  Talk with your health care provider about your risks for falling. Tell your health care provider if: ? You fall. Be sure to tell your health care provider about all falls, even ones that seem minor. ? You feel dizzy, sleepy, or off-balance.  Take over-the-counter and prescription medicines only as told by your health care provider. These include any supplements.  Eat a healthy diet and maintain a healthy weight. A healthy diet includes low-fat dairy products, low-fat (lean) meats, and fiber from whole grains, beans, and lots of fruits and vegetables. Home safety  Remove any tripping hazards, such as rugs, cords, and clutter.  Install safety equipment such as grab bars in bathrooms and safety rails on stairs.  Keep rooms and walkways well-lit. Activity  Follow a regular exercise program to stay fit. This will help you maintain your balance. Ask your  health care provider what types of exercise are appropriate for you.  If you need a cane or walker, use it as recommended by your health care provider.  Wear supportive shoes that have nonskid soles.   Lifestyle  Do not drink alcohol if your health care provider tells you not to drink.  If you drink alcohol, limit how much you have: ? 0-1  drink a day for women. ? 0-2 drinks a day for men.  Be aware of how much alcohol is in your drink. In the [place] one drink equals one typical bottle of beer (12 oz), one-half glass of wine (5 oz), or one shot of hard liquor (1 oz).  Do not use any products that contain nicotine or tobacco, such as cigarettes and e-cigarettes. If you need help quitting, ask your health care provider. Summary  Having a healthy lifestyle and getting preventive care can help to protect your health and wellness after age 28.  Screening and testing are the best way to find a health problem early and help you avoid having a fall. Early diagnosis and treatment give you the best chance for managing medical conditions that are more common for people who are older than age 44.  Falls are a major cause of broken bones and head injuries in people who are older than age 82. Take precautions to prevent a fall at home.  Work with your health care provider to learn what changes you can make to improve your health and wellness and to prevent falls. This information is not intended to replace advice given to you by your health care provider. Make sure you discuss any questions you have with your health care provider. Document Revised: 05/02/2018 Document Reviewed: 11/22/2016 Elsevier Patient Education  2021 [place], MD [facility]

## 2020-07-12 ENCOUNTER — Other Ambulatory Visit: Payer: Self-pay | Admitting: Emergency Medicine

## 2020-07-12 DIAGNOSIS — Z8673 Personal history of transient ischemic attack (TIA), and cerebral infarction without residual deficits: Secondary | ICD-10-CM

## 2020-07-12 DIAGNOSIS — I129 Hypertensive chronic kidney disease with stage 1 through stage 4 chronic kidney disease, or unspecified chronic kidney disease: Secondary | ICD-10-CM

## 2020-09-11 ENCOUNTER — Other Ambulatory Visit: Payer: Self-pay | Admitting: Emergency Medicine

## 2020-09-11 DIAGNOSIS — M62838 Other muscle spasm: Secondary | ICD-10-CM

## 2020-10-27 ENCOUNTER — Other Ambulatory Visit: Payer: Self-pay | Admitting: *Deleted

## 2020-10-27 DIAGNOSIS — Z85118 Personal history of other malignant neoplasm of bronchus and lung: Secondary | ICD-10-CM

## 2020-11-11 ENCOUNTER — Ambulatory Visit (INDEPENDENT_AMBULATORY_CARE_PROVIDER_SITE_OTHER): Payer: Medicare Other

## 2020-11-11 DIAGNOSIS — Z0000 Encounter for general adult medical examination without abnormal findings: Secondary | ICD-10-CM | POA: Diagnosis not present

## 2020-11-11 NOTE — Progress Notes (Signed)
I connected with [name] today by telephone and verified that I am speaking with the correct person using two identifiers. Location patient: home Location provider: work Persons participating in the virtual visit: patient, [name] (daughter) and provider.   I discussed the limitations, risks, security and privacy concerns of performing an evaluation and management service by telephone and the availability of in person appointments. I also discussed with the patient that there may be a patient responsible charge related to this service. The patient expressed understanding and verbally consented to this telephonic visit.    Interactive audio and video telecommunications were attempted between this provider and patient, however failed, due to patient having technical difficulties OR patient did not have access to video capability.  We continued and completed visit with audio only.  Some vital signs may be absent or patient reported.   Time Spent with patient on telephone encounter: 40 minutes  Subjective:   [name] is a 85 y.o. female who presents for Medicare Annual (Subsequent) preventive examination.  Review of Systems     Cardiac Risk Factors include: advanced age (>50men, >8 women);hypertension;family history of premature cardiovascular disease;dyslipidemia     Objective:    There were no vitals filed for this visit. There is no height or weight on file to calculate BMI.  Advanced Directives 11/11/2020 06/01/2015 07/03/2014 01/07/2014 11/24/2013 05/06/2013 04/07/2013  Does Patient Have a Medical Advance Directive? No No No No No Patient does not have advance directive Patient does not have advance directive;Patient would not like information  Would patient like information on creating a medical advance directive? No - Patient declined No - patient declined information No - patient declined information Yes - Educational materials given No - patient declined information - -     Current Medications (verified) Outpatient Encounter Medications as of 11/11/2020  Medication Sig   acetaminophen (TYLENOL) 500 MG tablet Take 1 tablet (500 mg total) by mouth every 6 (six) hours as needed.   atorvastatin (LIPITOR) 10 MG tablet TAKE 1 TABLET (10 MG TOTAL) BY MOUTH DAILY AT 6 PM.   chlorthalidone (HYGROTON) 25 MG tablet TAKE 1 TABLET BY MOUTH EVERY DAY   Cholecalciferol (VITAMIN D-3) 1000 units CAPS Take 1 capsule (1,000 Units total) by mouth daily.   clopidogrel (PLAVIX) 75 MG tablet TAKE 1 TABLET (75 MG TOTAL) BY MOUTH DAILY WITH BREAKFAST.   gabapentin (NEURONTIN) 300 MG capsule Take 300 mg by mouth daily.   magnesium oxide (MAG-OX) 400 MG tablet Take 1 tablet (400 mg total) by mouth daily.   methocarbamol (ROBAXIN) 500 MG tablet TAKE 1 TABLET BY MOUTH AT BEDTIME AS NEEDED FOR MUSCLE SPASMS.   metoprolol succinate (TOPROL-XL) 25 MG 24 hr tablet TAKE 1 TABLET (25 MG) BY ORAL ROUTE TWICE A DAY   Multiple Vitamin (MULTIVITAMIN) tablet Take 1 tablet by mouth at bedtime.    telmisartan (MICARDIS) 40 MG tablet TAKE 1 TABLET BY MOUTH EVERY DAY   No facility-administered encounter medications on file as of 11/11/2020.    Allergies (verified) Aspirin, Lactose intolerance (gi), Adhesive [tape], Lisinopril, and Percocet [oxycodone-acetaminophen]   History: Past Medical History:  Diagnosis Date   Arthritis    Benign hypertensive kidney disease with chronic kidney disease stage I through stage IV, or unspecified(403.10)    sees dr. Justin Mend again in april   Cancer [facility])    lung ca dx'd 02/2013   Chronic kidney disease, stage III (moderate) (HCC)    Cough 02/26/2013   GERD (gastroesophageal reflux  disease)    Headache    Hypertension    Hyperthyroidism    took iodine treatment for this   Lung mass 02/27/2013   Neurologic disorder    2009   Shortness of breath    exertion   Thrombocytopenia, unspecified ([place]) 02/26/2013   Unspecified vitamin D deficiency    Past Surgical  History:  Procedure Laterality Date   BACK SURGERY     EYE SURGERY     cataract   EYE SURGERY Left    open up tear duct   LYMPH NODE DISSECTION Left 04/07/2013   Procedure: LYMPH NODE DISSECTION;  Surgeon: [name], MD;  Location: [place];  Service: Thoracic;  Laterality: Left;   NECK SURGERY     PORT-A-CATH REMOVAL Left 01/09/2014   Procedure: REMOVAL PORT-A-CATH;  Surgeon: [name], MD;  Location: [place];  Service: Thoracic;  Laterality: Left;   PORTACATH PLACEMENT Left 05/06/2013   Procedure: INSERTION PORT-A-CATH;  Surgeon: [name], MD;  Location: [place];  Service: Thoracic;  Laterality: Left;   VIDEO ASSISTED THORACOSCOPY (VATS)/ LOBECTOMY Left 04/07/2013   Procedure: VIDEO ASSISTED THORACOSCOPY (VATS)/ LOBECTOMY; INSERTION OF ON-Q PUMP;  Surgeon: [name], MD;  Location: [place];  Service: Thoracic;  Laterality: Left;   VIDEO BRONCHOSCOPY N/A 04/07/2013   Procedure: VIDEO BRONCHOSCOPY;  Surgeon: [name], MD;  Location: [facility] OR;  Service: Thoracic;  Laterality: N/A;   Family History  Problem Relation Age of Onset   Lung cancer Brother        smoked   Breast cancer Mother    Lung cancer Father        smoked   Rectal cancer Neg Hx    Stomach cancer Neg Hx    Esophageal cancer Neg Hx    Colon cancer Neg Hx    Social History   Socioeconomic History   Marital status: Single    Spouse name: Not on file   Number of children: 2   Years of education: Not on file   Highest education level: Not on file  Occupational History   Not on file  Tobacco Use   Smoking status: Former    Packs/day: 0.50    Years: 60.00    Pack years: 30.00    Types: Cigarettes    Quit date: 02/25/2012    Years since quitting: 8.7   Smokeless tobacco: Never  Substance and Sexual Activity   Alcohol use: No   Drug use: No   Sexual activity: Not on file  Other Topics Concern   Not on file  Social History Narrative   Not on file   Social Determinants of Health    Financial Resource Strain: Low Risk    Difficulty of Paying Living Expenses: Not hard at all  Food Insecurity: No Food Insecurity   Worried About Charity fundraiser in the Last Year: Never true   Arboriculturist in the Last Year: Never true  Transportation Needs: No Transportation Needs   Lack of Transportation (Medical): No   Lack of Transportation (Non-Medical): No  Physical Activity: Sufficiently Active   Days of Exercise per Week: 5 days   Minutes of Exercise per Session: 30 min  Stress: No Stress Concern Present   Feeling of Stress : Not at all  Social Connections: Moderately Integrated   Frequency of Communication with Friends and Family: More than three times a week   Frequency of Social Gatherings with Friends and Family:  More than three times a week   Attends Religious Services: More than 4 times per year   Active Member of Clubs or Organizations: Yes   Attends Music therapist: More than 4 times per year   Marital Status: Never married    Tobacco Counseling Counseling given: Not Answered   Clinical Intake:  Pre-visit preparation completed: Yes  Pain : No/denies pain     Nutritional Risks: None Diabetes: No  How often do you need to have someone help you when you read instructions, pamphlets, or other written materials from your doctor or pharmacy?: 1 - Never What is the last grade level you completed in school?: Elementary Education  Diabetic? no  Interpreter Needed?: No  Information entered by :: Lisette Abu, LPN   Activities of Daily Living In your present state of health, do you have any difficulty performing the following activities: 11/11/2020  Hearing? Y  Vision? N  Difficulty concentrating or making decisions? N  Walking or climbing stairs? N  Dressing or bathing? N  Doing errands, shopping? N  Preparing Food and eating ? N  Using the Toilet? N  In the past six months, have you accidently leaked urine? N  Do you have  problems with loss of bowel control? N  Managing your Medications? N  Managing your Finances? N  Housekeeping or managing your Housekeeping? N  Some recent data might be hidden    Patient Care Team: Horald Pollen, MD as PCP - General (Internal Medicine) Heath Lark, MD as Consulting Physician (Hematology and Oncology) [name], MD (Inactive) as Consulting Physician (Cardiothoracic Surgery) Camillo Flaming, OD as Referring Physician (Optometry)  Indicate any recent Medical Services you may have received from other than [facility] providers in the past year (date may be approximate).     Assessment:   This is a routine wellness examination for [place].  Hearing/Vision screen Hearing Screening - Comments:: Patient has decreased hearing. Hearing aids ordered. Vision Screening - Comments:: Patient does not wear any corrective lenses.  Eye exam done annually by: Camillo Flaming, OD.  Dietary issues and exercise activities discussed: Current Exercise Habits: Home exercise routine, Time (Minutes): 30, Frequency (Times/Week): 5, Weekly Exercise (Minutes/Week): 150, Intensity: Mild, Exercise limited by: respiratory conditions(s)   Goals Addressed               This Visit's Progress     Patient Stated (pt-stated)        Continue to thank God for all of his many blessings.      Depression Screen PHQ 2/9 Scores 11/11/2020 06/14/2020 06/17/2019 07/30/2018 11/01/2017 06/29/2017  PHQ - 2 Score 0 0 0 0 0 0    Fall Risk Fall Risk  11/11/2020 12/16/2019 06/17/2019 07/30/2018 11/01/2017  Falls in the past year? 0 0 0 0 No  Number falls in past yr: 0 0 - - -  Injury with Fall? 0 0 - 0 -  Risk for fall due to : No Fall Risks - - - -  Follow up Falls evaluation completed - Falls evaluation completed - -    FALL RISK PREVENTION PERTAINING TO THE HOME:  Any stairs in or around the home? No  If so, are there any without handrails? No  Home free of loose throw rugs in walkways, pet beds,  electrical cords, etc? Yes  Adequate lighting in your home to reduce risk of falls? Yes   ASSISTIVE DEVICES UTILIZED TO PREVENT FALLS:  Life alert? No  Use of  a cane, walker or w/c? No  Grab bars in the bathroom? No  Shower chair or bench in shower? No  Elevated toilet seat or a handicapped toilet? No   TIMED UP AND GO:  Was the test performed? No .  Length of time to ambulate 10 feet: n/a sec.   Gait steady and fast without use of assistive device  Cognitive Function: Normal cognitive status assessed by direct observation by this Nurse Health Advisor. No abnormalities found.          Immunizations Immunization History  Administered Date(s) Administered   Influenza Split 10/23/2012, 10/22/2013, 10/12/2015   Influenza, High Dose Seasonal PF 11/01/2017   Pneumococcal Conjugate-13 11/26/2018, 06/17/2019   Pneumococcal Polysaccharide-23 04/11/2013    TDAP status: Due, Education has been provided regarding the importance of this vaccine. Advised may receive this vaccine at local pharmacy or Health Dept. Aware to provide a copy of the vaccination record if obtained from local pharmacy or Health Dept. Verbalized acceptance and understanding.  Flu Vaccine status: Due, Education has been provided regarding the importance of this vaccine. Advised may receive this vaccine at local pharmacy or Health Dept. Aware to provide a copy of the vaccination record if obtained from local pharmacy or Health Dept. Verbalized acceptance and understanding.  Pneumococcal vaccine status: Up to date  Covid-19 vaccine status: Completed vaccines (need dates)  Qualifies for Shingles Vaccine? Yes   Zostavax completed No   Shingrix Completed?: Yes (need records)  Screening Tests Health Maintenance  Topic Date Due   COVID-19 Vaccine (1) Never done   Zoster Vaccines- Shingrix (1 of 2) Never done   DEXA SCAN  Never done   TETANUS/TDAP  12/15/2020 (Originally 05/30/1954)   Pneumonia Vaccine 64+ Years old   Completed   HPV VACCINES  Aged Out    Health Maintenance  Health Maintenance Due  Topic Date Due   COVID-19 Vaccine (1) Never done   Zoster Vaccines- Shingrix (1 of 2) Never done   DEXA SCAN  Never done    Colorectal cancer screening: No longer required.   Mammogram status: Completed 02/25/2020. Repeat every year  Lung Cancer Screening: (Low Dose CT Chest recommended if Age 15-80 years, 30 pack-year currently smoking OR have quit w/in 15years.) does not qualify.   Lung Cancer Screening Referral: no  Additional Screening:  Hepatitis C Screening: does not qualify; Completed no  Vision Screening: Recommended annual ophthalmology exams for early detection of glaucoma and other disorders of the eye. Is the patient up to date with their annual eye exam?  Yes  Who is the provider or what is the name of the office in which the patient attends annual eye exams? Camillo Flaming, OD. If pt is not established with a provider, would they like to be referred to a provider to establish care? No .   Dental Screening: Recommended annual dental exams for proper oral hygiene  Community Resource Referral / Chronic Care Management: CRR required this visit?  No   CCM required this visit?  No      Plan:     I have personally reviewed and noted the following in the patient's chart:   Medical and social history Use of alcohol, tobacco or illicit drugs  Current medications and supplements including opioid prescriptions.  Functional ability and status Nutritional status Physical activity Advanced directives List of other physicians Hospitalizations, surgeries, and ER visits in previous 12 months Vitals Screenings to include cognitive, depression, and falls Referrals and appointments  In addition,  I have reviewed and discussed with patient certain preventive protocols, quality metrics, and best practice recommendations. A written personalized care plan for preventive services as well as  general preventive health recommendations were provided to patient.     Sheral Flow, LPN   16/10/9602   Nurse Notes:  Patient is cogitatively intact. There were no vitals filed for this visit. There is no height or weight on file to calculate BMI. Patient stated that she has no issues with gait or balance; does not use any assistive devices. Medications reviewed with patient; no opioid use noted. Hearing Screening - Comments:: Patient has decreased hearing. Hearing aids ordered. Vision Screening - Comments:: Patient does not wear any corrective lenses.  Eye exam done annually by: Camillo Flaming, OD.

## 2020-11-23 ENCOUNTER — Encounter: Payer: Self-pay | Admitting: Emergency Medicine

## 2020-11-23 ENCOUNTER — Ambulatory Visit (INDEPENDENT_AMBULATORY_CARE_PROVIDER_SITE_OTHER): Payer: Medicare Other | Admitting: Emergency Medicine

## 2020-11-23 ENCOUNTER — Other Ambulatory Visit: Payer: Self-pay

## 2020-11-23 VITALS — BP 136/76 | HR 56 | Temp 98.6°F | Ht 61.0 in | Wt 157.0 lb

## 2020-11-23 DIAGNOSIS — I129 Hypertensive chronic kidney disease with stage 1 through stage 4 chronic kidney disease, or unspecified chronic kidney disease: Secondary | ICD-10-CM | POA: Diagnosis not present

## 2020-11-23 DIAGNOSIS — Z8673 Personal history of transient ischemic attack (TIA), and cerebral infarction without residual deficits: Secondary | ICD-10-CM

## 2020-11-23 DIAGNOSIS — I700 Atherosclerosis of aorta: Secondary | ICD-10-CM | POA: Diagnosis not present

## 2020-11-23 DIAGNOSIS — Z23 Encounter for immunization: Secondary | ICD-10-CM | POA: Diagnosis not present

## 2020-11-23 DIAGNOSIS — I2510 Atherosclerotic heart disease of native coronary artery without angina pectoris: Secondary | ICD-10-CM

## 2020-11-23 DIAGNOSIS — N184 Chronic kidney disease, stage 4 (severe): Secondary | ICD-10-CM | POA: Diagnosis not present

## 2020-11-23 DIAGNOSIS — Z0001 Encounter for general adult medical examination with abnormal findings: Secondary | ICD-10-CM

## 2020-11-23 DIAGNOSIS — Z7901 Long term (current) use of anticoagulants: Secondary | ICD-10-CM

## 2020-11-23 DIAGNOSIS — Z85118 Personal history of other malignant neoplasm of bronchus and lung: Secondary | ICD-10-CM

## 2020-11-23 LAB — LIPID PANEL
Cholesterol: 165 mg/dL (ref 0–200)
HDL: 50.6 mg/dL (ref >39.00)
LDL Cholesterol: 96 mg/dL (ref 0–99)
NonHDL: 114.27
Total CHOL/HDL Ratio: 3
Triglycerides: 92 mg/dL (ref 0.0–149.0)
VLDL: 18.4 mg/dL (ref 0.0–40.0)

## 2020-11-23 LAB — COMPREHENSIVE METABOLIC PANEL
ALT: 16 U/L (ref 0–35)
AST: 24 U/L (ref 0–37)
Albumin: 4.2 g/dL (ref 3.5–5.2)
Alkaline Phosphatase: 71 U/L (ref 39–117)
BUN: 24 mg/dL — ABNORMAL HIGH (ref 6–23)
CO2: 28 mEq/L (ref 19–32)
Calcium: 9.4 mg/dL (ref 8.4–10.5)
Chloride: 107 mEq/L (ref 96–112)
Creatinine, Ser: 1.64 mg/dL — ABNORMAL HIGH (ref 0.40–1.20)
GFR: 28.78 mL/min — ABNORMAL LOW (ref >60.00)
Glucose, Bld: 94 mg/dL (ref 70–99)
Potassium: 4.2 mEq/L (ref 3.5–5.1)
Sodium: 141 mEq/L (ref 135–145)
Total Bilirubin: 0.5 mg/dL (ref 0.2–1.2)
Total Protein: 7.1 g/dL (ref 6.0–8.3)

## 2020-11-23 LAB — CBC WITH DIFFERENTIAL/PLATELET
Basophils Absolute: 0.1 10*3/uL (ref 0.0–0.1)
Basophils Relative: 1.3 % (ref 0.0–3.0)
Eosinophils Absolute: 0.3 10*3/uL (ref 0.0–0.7)
Eosinophils Relative: 5.4 % — ABNORMAL HIGH (ref 0.0–5.0)
HCT: 37.2 % (ref 36.0–46.0)
Hemoglobin: 12.3 g/dL (ref 12.0–15.0)
Lymphocytes Relative: 22.5 % (ref 12.0–46.0)
Lymphs Abs: 1.2 10*3/uL (ref 0.7–4.0)
MCHC: 33.1 g/dL (ref 30.0–36.0)
MCV: 94.5 fl (ref 78.0–100.0)
Monocytes Absolute: 0.6 10*3/uL (ref 0.1–1.0)
Monocytes Relative: 11.2 % (ref 3.0–12.0)
Neutro Abs: 3.1 10*3/uL (ref 1.4–7.7)
Neutrophils Relative %: 59.6 % (ref 43.0–77.0)
Platelets: 241 10*3/uL (ref 150.0–400.0)
RBC: 3.94 Mil/uL (ref 3.87–5.11)
RDW: 14.7 % (ref 11.5–15.5)
WBC: 5.2 10*3/uL (ref 4.0–10.5)

## 2020-11-23 LAB — HEMOGLOBIN A1C: Hgb A1c MFr Bld: 5.5 % (ref 4.6–6.5)

## 2020-11-23 NOTE — Patient Instructions (Signed)
Health Maintenance, Female Adopting a healthy lifestyle and getting preventive care are important in promoting health and wellness. Ask your health care provider about: The right schedule for you to have regular tests and exams. Things you can do on your own to prevent diseases and keep yourself healthy. What should I know about diet, weight, and exercise? Eat a healthy diet  Eat a diet that includes plenty of vegetables, fruits, low-fat dairy products, and lean protein. Do not eat a lot of foods that are high in solid fats, added sugars, or sodium. Maintain a healthy weight Body mass index (BMI) is used to identify weight problems. It estimates body fat based on height and weight. Your health care provider can help determine your BMI and help you achieve or maintain a healthy weight. Get regular exercise Get regular exercise. This is one of the most important things you can do for your health. Most adults should: Exercise for at least 150 minutes each week. The exercise should increase your heart rate and make you sweat (moderate-intensity exercise). Do strengthening exercises at least twice a week. This is in addition to the moderate-intensity exercise. Spend less time sitting. Even light physical activity can be beneficial. Watch cholesterol and blood lipids Have your blood tested for lipids and cholesterol at 83 years of age, then have this test every 5 years. Have your cholesterol levels checked more often if: Your lipid or cholesterol levels are high. You are older than 83 years of age. You are at high risk for heart disease. What should I know about cancer screening? Depending on your health history and family history, you may need to have cancer screening at various ages. This may include screening for: Breast cancer. Cervical cancer. Colorectal cancer. Skin cancer. Lung cancer. What should I know about heart disease, diabetes, and high blood pressure? Blood pressure and heart  disease High blood pressure causes heart disease and increases the risk of stroke. This is more likely to develop in people who have high blood pressure readings, are of African descent, or are overweight. Have your blood pressure checked: Every 3-5 years if you are 18-39 years of age. Every year if you are 40 years old or older. Diabetes Have regular diabetes screenings. This checks your fasting blood sugar level. Have the screening done: Once every three years after age 40 if you are at a normal weight and have a low risk for diabetes. More often and at a younger age if you are overweight or have a high risk for diabetes. What should I know about preventing infection? Hepatitis B If you have a higher risk for hepatitis B, you should be screened for this virus. Talk with your health care provider to find out if you are at risk for hepatitis B infection. Hepatitis C Testing is recommended for: Everyone born from 1945 through 1965. Anyone with known risk factors for hepatitis C. Sexually transmitted infections (STIs) Get screened for STIs, including gonorrhea and chlamydia, if: You are sexually active and are younger than 83 years of age. You are older than 83 years of age and your health care provider tells you that you are at risk for this type of infection. Your sexual activity has changed since you were last screened, and you are at increased risk for chlamydia or gonorrhea. Ask your health care provider if you are at risk. Ask your health care provider about whether you are at high risk for HIV. Your health care provider may recommend a prescription medicine   to help prevent HIV infection. If you choose to take medicine to prevent HIV, you should first get tested for HIV. You should then be tested every 3 months for as long as you are taking the medicine. Pregnancy If you are about to stop having your period (premenopausal) and you may become pregnant, seek counseling before you get  pregnant. Take 400 to 800 micrograms (mcg) of folic acid every day if you become pregnant. Ask for birth control (contraception) if you want to prevent pregnancy. Osteoporosis and menopause Osteoporosis is a disease in which the bones lose minerals and strength with aging. This can result in bone fractures. If you are 65 years old or older, or if you are at risk for osteoporosis and fractures, ask your health care provider if you should: Be screened for bone loss. Take a calcium or vitamin D supplement to lower your risk of fractures. Be given hormone replacement therapy (HRT) to treat symptoms of menopause. Follow these instructions at home: Lifestyle Do not use any products that contain nicotine or tobacco, such as cigarettes, e-cigarettes, and chewing tobacco. If you need help quitting, ask your health care provider. Do not use street drugs. Do not share needles. Ask your health care provider for help if you need support or information about quitting drugs. Alcohol use Do not drink alcohol if: Your health care provider tells you not to drink. You are pregnant, may be pregnant, or are planning to become pregnant. If you drink alcohol: Limit how much you use to 0-1 drink a day. Limit intake if you are breastfeeding. Be aware of how much alcohol is in your drink. In the [place] one drink equals one 12 oz bottle of beer (355 mL), one 5 oz glass of wine (148 mL), or one 1 oz glass of hard liquor (44 mL). General instructions Schedule regular health, dental, and eye exams. Stay current with your vaccines. Tell your health care provider if: You often feel depressed. You have ever been abused or do not feel safe at home. Summary Adopting a healthy lifestyle and getting preventive care are important in promoting health and wellness. Follow your health care provider's instructions about healthy diet, exercising, and getting tested or screened for diseases. Follow your health care provider's  instructions on monitoring your cholesterol and blood pressure. This information is not intended to replace advice given to you by your health care provider. Make sure you discuss any questions you have with your health care provider. Document Revised: 03/19/2020 Document Reviewed: 01/02/2018 Elsevier Patient Education  2022 [employer]  

## 2020-11-23 NOTE — Progress Notes (Signed)
[name] 85 y.o.   Chief Complaint  Patient presents with   Annual Exam    HISTORY OF PRESENT ILLNESS: This is a 85 y.o. female here for annual exam.  Doing well.  Has no complaints or medical concerns today. Has history of hypertension with chronic kidney disease. Needs influenza vaccine.  HPI   Prior to Admission medications   Medication Sig Start Date End Date Taking? Authorizing Provider  acetaminophen (TYLENOL) 500 MG tablet Take 1 tablet (500 mg total) by mouth every 6 (six) hours as needed. 01/15/17   Zigmund Gottron, NP  atorvastatin (LIPITOR) 10 MG tablet TAKE 1 TABLET (10 MG TOTAL) BY MOUTH DAILY AT 6 PM. 06/14/20   Horald Pollen, MD  chlorthalidone (HYGROTON) 25 MG tablet TAKE 1 TABLET BY MOUTH EVERY DAY 07/13/20   Horald Pollen, MD  Cholecalciferol (VITAMIN D-3) 1000 units CAPS Take 1 capsule (1,000 Units total) by mouth daily. 08/02/17   Forrest Moron, MD  clopidogrel (PLAVIX) 75 MG tablet TAKE 1 TABLET (75 MG TOTAL) BY MOUTH DAILY WITH BREAKFAST. 07/13/20   Horald Pollen, MD  gabapentin (NEURONTIN) 300 MG capsule Take 300 mg by mouth daily.    [provider]  magnesium oxide (MAG-OX) 400 MG tablet Take 1 tablet (400 mg total) by mouth daily. 08/02/17   Forrest Moron, MD  methocarbamol (ROBAXIN) 500 MG tablet TAKE 1 TABLET BY MOUTH AT BEDTIME AS NEEDED FOR MUSCLE SPASMS. 09/13/20   Horald Pollen, MD  metoprolol succinate (TOPROL-XL) 25 MG 24 hr tablet TAKE 1 TABLET (25 MG) BY ORAL ROUTE TWICE A DAY 07/13/20   Horald Pollen, MD  Multiple Vitamin (MULTIVITAMIN) tablet Take 1 tablet by mouth at bedtime.     [provider]  telmisartan (MICARDIS) 40 MG tablet TAKE 1 TABLET BY MOUTH EVERY DAY 07/13/20   Horald Pollen, MD    Allergies  Allergen Reactions   Aspirin Other (See Comments)    REACTION: stomach upset   Lactose Intolerance (Gi) Diarrhea and Other (See Comments)    "can't drink milk. It tears  my stomach up."   Adhesive [Tape] Rash   Lisinopril Other (See Comments)    cough   Percocet [Oxycodone-Acetaminophen] Itching    Patient Active Problem List   Diagnosis Date Noted   Atherosclerosis of aorta ([place]) 06/14/2020   Pulmonary emphysema ([place]) 06/14/2020   Atherosclerosis of native coronary artery of native heart without angina pectoris 06/14/2020   Malignant neoplasm of unspecified part of unspecified bronchus or lung ([place]) 12/16/2019   Sacroiliac joint disease 08/02/2017   History of TIA (transient ischemic attack) 08/02/2017   Vitamin D deficiency 08/02/2017   Arthritis 08/02/2017   Long term current use of anticoagulant therapy 08/02/2017   Left thyroid nodule 01/01/2015   Benign hypertension with CKD (chronic kidney disease) stage IV ([place]) 11/06/2013   Anemia in chronic kidney disease 05/28/2013   History of lung cancer 04/07/2013   Essential hypertension, benign 03/05/2013   Personal history of colonic polyps 10/29/2012    Past Medical History:  Diagnosis Date   Arthritis    Benign hypertensive kidney disease with chronic kidney disease stage I through stage IV, or unspecified(403.10)    sees dr. Justin Mend again in april   Cancer [facility])    lung ca dx'd 02/2013   Chronic kidney disease, stage III (moderate) (HCC)    Cough 02/26/2013   GERD (gastroesophageal reflux disease)    Headache  Hypertension    Hyperthyroidism    took iodine treatment for this   Lung mass 02/27/2013   Neurologic disorder    2009   Shortness of breath    exertion   Thrombocytopenia, unspecified ([place]) 02/26/2013   Unspecified vitamin D deficiency     Past Surgical History:  Procedure Laterality Date   BACK SURGERY     EYE SURGERY     cataract   EYE SURGERY Left    open up tear duct   LYMPH NODE DISSECTION Left 04/07/2013   Procedure: LYMPH NODE DISSECTION;  Surgeon: [name], MD;  Location: [facility] OR;  Service: Thoracic;  Laterality: Left;   NECK SURGERY     PORT-A-CATH REMOVAL  Left 01/09/2014   Procedure: REMOVAL PORT-A-CATH;  Surgeon: [name], MD;  Location: [place];  Service: Thoracic;  Laterality: Left;   PORTACATH PLACEMENT Left 05/06/2013   Procedure: INSERTION PORT-A-CATH;  Surgeon: [name], MD;  Location: [place];  Service: Thoracic;  Laterality: Left;   VIDEO ASSISTED THORACOSCOPY (VATS)/ LOBECTOMY Left 04/07/2013   Procedure: VIDEO ASSISTED THORACOSCOPY (VATS)/ LOBECTOMY; INSERTION OF ON-Q PUMP;  Surgeon: [name], MD;  Location: [place];  Service: Thoracic;  Laterality: Left;   VIDEO BRONCHOSCOPY N/A 04/07/2013   Procedure: VIDEO BRONCHOSCOPY;  Surgeon: [name], MD;  Location: [facility] OR;  Service: Thoracic;  Laterality: N/A;    Social History   Socioeconomic History   Marital status: Single    Spouse name: Not on file   Number of children: 2   Years of education: Not on file   Highest education level: Not on file  Occupational History   Not on file  Tobacco Use   Smoking status: Former    Packs/day: 0.50    Years: 60.00    Pack years: 30.00    Types: Cigarettes    Quit date: 02/25/2012    Years since quitting: 8.7   Smokeless tobacco: Never  Substance and Sexual Activity   Alcohol use: No   Drug use: No   Sexual activity: Not on file  Other Topics Concern   Not on file  Social History Narrative   Not on file   Social Determinants of Health   Financial Resource Strain: Low Risk    Difficulty of Paying Living Expenses: Not hard at all  Food Insecurity: No Food Insecurity   Worried About Estate manager/land agent of Food in the Last Year: Never true   Arboriculturist in the Last Year: Never true  Transportation Needs: No Transportation Needs   Lack of Transportation (Medical): No   Lack of Transportation (Non-Medical): No  Physical Activity: Sufficiently Active   Days of Exercise per Week: 5 days   Minutes of Exercise per Session: 30 min  Stress: No Stress Concern Present   Feeling of Stress : Not at all  Social  Connections: Moderately Integrated   Frequency of Communication with Friends and Family: More than three times a week   Frequency of Social Gatherings with Friends and Family: More than three times a week   Attends Religious Services: More than 4 times per year   Active Member of [employer] or Organizations: Yes   Attends Music therapist: More than 4 times per year   Marital Status: Never married  Human resources officer Violence: Not At Risk   Fear of Current or Ex-Partner: No   Emotionally Abused: No   Physically Abused: No   Sexually Abused: No  Family History  Problem Relation Age of Onset   Lung cancer Brother        smoked   Breast cancer Mother    Lung cancer Father        smoked   Rectal cancer Neg Hx    Stomach cancer Neg Hx    Esophageal cancer Neg Hx    Colon cancer Neg Hx      Review of Systems  Constitutional: Negative.  Negative for chills and fever.  HENT: Negative.  Negative for congestion and sore throat.   Respiratory: Negative.  Negative for cough and shortness of breath.   Cardiovascular: Negative.  Negative for chest pain and palpitations.  Gastrointestinal:  Negative for abdominal pain, diarrhea, nausea and vomiting.  Genitourinary:  Positive for frequency. Negative for dysuria and hematuria.  Skin: Negative.  Negative for rash.  Neurological: Negative.  Negative for dizziness and headaches.  All other systems reviewed and are negative.  Vitals:   11/23/20 1313  BP: 136/76  Pulse: (!) 56  Temp: 98.6 F (37 C)  SpO2: 98%   Wt Readings from Last 3 Encounters:  11/23/20 157 lb (71.2 kg)  06/14/20 167 lb (75.8 kg)  12/16/19 166 lb 6.4 oz (75.5 kg)    Physical Exam Vitals reviewed.  Constitutional:      Appearance: Normal appearance.  HENT:     Head: Normocephalic.  Eyes:     Extraocular Movements: Extraocular movements intact.     Conjunctiva/sclera: Conjunctivae normal.     Pupils: Pupils are equal, round, and reactive to light.   Cardiovascular:     Rate and Rhythm: Normal rate and regular rhythm.     Pulses: Normal pulses.     Heart sounds: Normal heart sounds.  Pulmonary:     Effort: Pulmonary effort is normal.     Breath sounds: Normal breath sounds.  Abdominal:     Tenderness: There is no abdominal tenderness.  Musculoskeletal:     Cervical back: No tenderness.  Lymphadenopathy:     Cervical: No cervical adenopathy.  Skin:    General: Skin is warm and dry.     Capillary Refill: Capillary refill takes less than 2 seconds.  Neurological:     General: No focal deficit present.     Mental Status: She is alert and oriented to person, place, and time.  Psychiatric:        Mood and Affect: Mood normal.        Behavior: Behavior normal.     ASSESSMENT & PLAN: Problem List Items Addressed This Visit       Cardiovascular and Mediastinum   Benign hypertension with CKD (chronic kidney disease) stage IV (HCC)   Relevant Orders   CBC with Differential/Platelet   Comprehensive metabolic panel   Hemoglobin A1c   Lipid panel   Atherosclerosis of aorta (HCC)   Relevant Orders   Lipid panel   Atherosclerosis of native coronary artery of native heart without angina pectoris     Other   History of lung cancer   History of TIA (transient ischemic attack)   Long term current use of anticoagulant therapy   Other Visit Diagnoses     Encounter for general adult medical examination with abnormal findings    -  Primary      Modifiable risk factors discussed with patient. Anticipatory guidance according to age provided. The following topics were also discussed: Social Determinants of Health Smoking.  Non-smoker Diet and nutrition Benefits of exercise Cancer history  of surveillance Vaccinations recommendations and need for flu vaccine Cardiovascular risk assessment Mental health including depression and anxiety Fall and accident prevention.  Patient on long-term anticoagulation.  Patient Instructions   Health Maintenance, Female Adopting a healthy lifestyle and getting preventive care are important in promoting health and wellness. Ask your health care provider about: The right schedule for you to have regular tests and exams. Things you can do on your own to prevent diseases and keep yourself healthy. What should I know about diet, weight, and exercise? Eat a healthy diet  Eat a diet that includes plenty of vegetables, fruits, low-fat dairy products, and lean protein. Do not eat a lot of foods that are high in solid fats, added sugars, or sodium. Maintain a healthy weight Body mass index (BMI) is used to identify weight problems. It estimates body fat based on height and weight. Your health care provider can help determine your BMI and help you achieve or maintain a healthy weight. Get regular exercise Get regular exercise. This is one of the most important things you can do for your health. Most adults should: Exercise for at least 150 minutes each week. The exercise should increase your heart rate and make you sweat (moderate-intensity exercise). Do strengthening exercises at least twice a week. This is in addition to the moderate-intensity exercise. Spend less time sitting. Even light physical activity can be beneficial. Watch cholesterol and blood lipids Have your blood tested for lipids and cholesterol at 83 years of age, then have this test every 5 years. Have your cholesterol levels checked more often if: Your lipid or cholesterol levels are high. You are older than 83 years of age. You are at high risk for heart disease. What should I know about cancer screening? Depending on your health history and family history, you may need to have cancer screening at various ages. This may include screening for: Breast cancer. Cervical cancer. Colorectal cancer. Skin cancer. Lung cancer. What should I know about heart disease, diabetes, and high blood pressure? Blood pressure and heart  disease High blood pressure causes heart disease and increases the risk of stroke. This is more likely to develop in people who have high blood pressure readings, are of African descent, or are overweight. Have your blood pressure checked: Every 3-5 years if you are 10-9 years of age. Every year if you are 84 years old or older. Diabetes Have regular diabetes screenings. This checks your fasting blood sugar level. Have the screening done: Once every three years after age 73 if you are at a normal weight and have a low risk for diabetes. More often and at a younger age if you are overweight or have a high risk for diabetes. What should I know about preventing infection? Hepatitis B If you have a higher risk for hepatitis B, you should be screened for this virus. Talk with your health care provider to find out if you are at risk for hepatitis B infection. Hepatitis C Testing is recommended for: Everyone born from [birth year] through 1963. Anyone with known risk factors for hepatitis C. Sexually transmitted infections (STIs) Get screened for STIs, including gonorrhea and chlamydia, if: You are sexually active and are younger than 83 years of age. You are older than 83 years of age and your health care provider tells you that you are at risk for this type of infection. Your sexual activity has changed since you were last screened, and you are at increased risk for chlamydia or gonorrhea. Ask  your health care provider if you are at risk. Ask your health care provider about whether you are at high risk for HIV. Your health care provider may recommend a prescription medicine to help prevent HIV infection. If you choose to take medicine to prevent HIV, you should first get tested for HIV. You should then be tested every 3 months for as long as you are taking the medicine. Pregnancy If you are about to stop having your period (premenopausal) and you may become pregnant, seek counseling before you get  pregnant. Take 400 to 800 micrograms (mcg) of folic acid every day if you become pregnant. Ask for birth control (contraception) if you want to prevent pregnancy. Osteoporosis and menopause Osteoporosis is a disease in which the bones lose minerals and strength with aging. This can result in bone fractures. If you are 66 years old or older, or if you are at risk for osteoporosis and fractures, ask your health care provider if you should: Be screened for bone loss. Take a calcium or vitamin D supplement to lower your risk of fractures. Be given hormone replacement therapy (HRT) to treat symptoms of menopause. Follow these instructions at home: Lifestyle Do not use any products that contain nicotine or tobacco, such as cigarettes, e-cigarettes, and chewing tobacco. If you need help quitting, ask your health care provider. Do not use street drugs. Do not share needles. Ask your health care provider for help if you need support or information about quitting drugs. Alcohol use Do not drink alcohol if: Your health care provider tells you not to drink. You are pregnant, may be pregnant, or are planning to become pregnant. If you drink alcohol: Limit how much you use to 0-1 drink a day. Limit intake if you are breastfeeding. Be aware of how much alcohol is in your drink. In the [place] one drink equals one 12 oz bottle of beer (355 mL), one 5 oz glass of wine (148 mL), or one 1 oz glass of hard liquor (44 mL). General instructions Schedule regular health, dental, and eye exams. Stay current with your vaccines. Tell your health care provider if: You often feel depressed. You have ever been abused or do not feel safe at home. Summary Adopting a healthy lifestyle and getting preventive care are important in promoting health and wellness. Follow your health care provider's instructions about healthy diet, exercising, and getting tested or screened for diseases. Follow your health care provider's  instructions on monitoring your cholesterol and blood pressure. This information is not intended to replace advice given to you by your health care provider. Make sure you discuss any questions you have with your health care provider. Document Revised: 03/19/2020 Document Reviewed: 01/02/2018 Elsevier Patient Education  2022 [place], [place] [facility]

## 2020-11-29 ENCOUNTER — Ambulatory Visit (INDEPENDENT_AMBULATORY_CARE_PROVIDER_SITE_OTHER): Payer: Medicare Other | Admitting: Surgical

## 2020-11-29 ENCOUNTER — Ambulatory Visit
Admission: RE | Admit: 2020-11-29 | Discharge: 2020-11-29 | Disposition: A | Discharge status: Home / Self Care | Payer: Medicare Other | Source: Ambulatory Visit | Attending: Thoracic Surgery (Cardiothoracic Vascular Surgery) | Admitting: Thoracic Surgery (Cardiothoracic Vascular Surgery)

## 2020-11-29 ENCOUNTER — Other Ambulatory Visit: Payer: Self-pay

## 2020-11-29 VITALS — BP 150/73 | HR 64 | Resp 20 | Ht 61.0 in | Wt 158.0 lb

## 2020-11-29 DIAGNOSIS — Z85118 Personal history of other malignant neoplasm of bronchus and lung: Secondary | ICD-10-CM

## 2020-11-29 NOTE — Patient Instructions (Signed)
No changes

## 2020-11-29 NOTE — Progress Notes (Signed)
[address]       [place] [address]             [phone]                  Kinesha H Feltman [facility] Medical Record #[id number] Date of Birth: Mar 25, 1935  Referring YI:RSWN, Christena Deem, MD Primary Cardiology: Primary Care:Sagardia, Ines Bloomer, MD  Chief Complaint:  Follow Up Visit  Cancer Staging History of lung cancer Staging form: Lung, AJCC 7th Edition - Pathologic: Stage IB (T2a, N0, cM0) - Signed by [name], MD on 04/09/2013 - Clinical stage from 04/29/2013: Stage IB (T2a, N0, M0) - Signed by Heath Lark, MD on 04/29/2013  04/07/2013  OPERATIVE REPORT  Preop diagnosis: Left lower lobe lung mass suspicious for primary carcinoma the lung  Postop diagnosis: Same, non-small cell lung cancer probably adenocarcinoma by frozen section  Procedure:  Bronchoscopy  Left video-assisted thoracoscopy  Left lower lobectomy and lymph node dissection  Placement of On-Q device  Surgeon: [name] MD    History of Present Illness:    The patient is an 85 year old female seen in the office in postoperative surveillance.  She reports that overall she is feeling pretty well.  She does have some dyspnea with exertion at times, in particular when she has to wear a mask.  She denies any cough or other significant pulmonary symptoms at this time.  In general she is pleased with her overall health knowing that her age and comorbidities are somewhat limiting.         Zubrod Score: At the time of surgery this patient's most appropriate activity status/level should be described as: []     0    Normal activity, no symptoms []     1    Restricted in physical strenuous activity but ambulatory, able to do out light work []     2    Ambulatory and capable of self care, unable to do work activities, up and about                 >50 % of waking hours                                                                                   []     3    Only limited self  care, in bed greater than 50% of waking hours []     4    Completely disabled, no self care, confined to bed or chair []     5    Moribund  Social History   Tobacco Use  Smoking Status Former   Packs/day: 0.50   Years: 60.00   Pack years: 30.00   Types: Cigarettes   Quit date: 02/25/2012   Years since quitting: 8.7  Smokeless Tobacco Never       Allergies  Allergen Reactions   Aspirin Other (See Comments)    REACTION: stomach upset   Lactose Intolerance (Gi) Diarrhea and Other (See Comments)    "can't drink milk. It tears my stomach up."   Adhesive [Tape] Rash   Lisinopril Other (See Comments)  cough   Percocet [Oxycodone-Acetaminophen] Itching    Current Outpatient Medications  Medication Sig Dispense Refill   acetaminophen (TYLENOL) 500 MG tablet Take 1 tablet (500 mg total) by mouth every 6 (six) hours as needed. 30 tablet 0   atorvastatin (LIPITOR) 10 MG tablet TAKE 1 TABLET (10 MG TOTAL) BY MOUTH DAILY AT 6 PM. 90 tablet 3   chlorthalidone (HYGROTON) 25 MG tablet TAKE 1 TABLET BY MOUTH EVERY DAY 90 tablet 3   Cholecalciferol (VITAMIN D-3) 1000 units CAPS Take 1 capsule (1,000 Units total) by mouth daily. 90 capsule 1   clopidogrel (PLAVIX) 75 MG tablet TAKE 1 TABLET (75 MG TOTAL) BY MOUTH DAILY WITH BREAKFAST. 90 tablet 3   gabapentin (NEURONTIN) 300 MG capsule Take 300 mg by mouth daily.     magnesium oxide (MAG-OX) 400 MG tablet Take 1 tablet (400 mg total) by mouth daily. 90 tablet 1   methocarbamol (ROBAXIN) 500 MG tablet TAKE 1 TABLET BY MOUTH AT BEDTIME AS NEEDED FOR MUSCLE SPASMS. 30 tablet 2   metoprolol succinate (TOPROL-XL) 25 MG 24 hr tablet TAKE 1 TABLET (25 MG) BY ORAL ROUTE TWICE A DAY 180 tablet 3   Multiple Vitamin (MULTIVITAMIN) tablet Take 1 tablet by mouth at bedtime.      telmisartan (MICARDIS) 40 MG tablet TAKE 1 TABLET BY MOUTH EVERY DAY 90 tablet 3   No current facility-administered medications for this visit.       Physical Exam: BP (!)  150/73 (BP Location: Right Arm, Patient Position: Sitting)   Pulse 64   Resp 20   Ht 5\' 1"  (1.549 m)   Wt 158 lb (71.7 kg)   SpO2 98% Comment: RA  BMI 29.85 kg/m   General appearance: alert, cooperative, and no distress Heart: regular rate and rhythm Lungs: clear to auscultation bilaterally Wounds:  Diagnostic Studies & Laboratory data:         Recent Radiology Findings: CT CHEST WO CONTRAST  Result Date: 11/29/2020 CLINICAL DATA:  Follow up right lung cancer post lobectomy. EXAM: CT CHEST WITHOUT CONTRAST TECHNIQUE: Multidetector CT imaging of the chest was performed following the standard protocol without IV contrast. COMPARISON:  CT chest 11/27/2019 and 11/13/2018 FINDINGS: Cardiovascular: Again demonstrated is moderate atherosclerosis of the aorta, great vessels and coronary arteries. No acute vascular findings are evident on noncontrast imaging. The heart size is normal. There is no pericardial effusion. Mediastinum/Nodes: There are no enlarged mediastinal, hilar or axillary lymph nodes.A dominant left thyroid nodule is again demonstrated, measuring 3.6 x 3.0 cm on image 26/2. As remeasured on the prior study, this is unchanged. This has been evaluated on previous imaging. The trachea and esophagus demonstrate no significant findings. Lungs/Pleura: There is no pleural effusion or pneumothorax. Moderate centrilobular emphysema. Patient is status post left lower lobectomy. Left-sided nodular pleural thickening is unchanged. Left upper lobe nodule measuring 7 mm on image 83/8 is unchanged. Clustered right upper lobe nodularity on image 47/8 is stable. No new or enlarging pulmonary nodules identified. Upper abdomen: The visualized upper abdomen appears stable without suspicious findings. There are stable low-density hepatic lesions consistent with cysts. Musculoskeletal/Chest wall: There is no chest wall mass or suspicious osseous finding. Stable thoracotomy changes on the left. Previous lower  cervical fusion noted. IMPRESSION: 1. Stable postoperative chest CT. No evidence of local recurrence or metastatic disease. 2. Bilateral lung nodularity appears stable from 11/13/2018, consistent with benign findings. Given the patient's history, continued follow-up likely warranted. 3. A previously evaluated left thyroid nodule  is unchanged. 4. Aortic Atherosclerosis (ICD10-I70.0) and Emphysema (ICD10-J43.9). Electronically Signed   By: Richardean Sale M.D.   On: 11/29/2020 09:33      I have independently reviewed the above radiology findings and reviewed findings  with the patient.  Recent Labs: Lab Results  Component Value Date   WBC 5.2 11/23/2020   HGB 12.3 11/23/2020   HCT 37.2 11/23/2020   PLT 241.0 11/23/2020   GLUCOSE 94 11/23/2020   CHOL 165 11/23/2020   TRIG 92.0 11/23/2020   HDL 50.60 11/23/2020   LDLCALC 96 11/23/2020   ALT 16 11/23/2020   AST 24 11/23/2020   NA 141 11/23/2020   K 4.2 11/23/2020   CL 107 11/23/2020   CREATININE 1.64 (H) 11/23/2020   BUN 24 (H) 11/23/2020   CO2 28 11/23/2020   TSH 1.810 07/30/2018   INR 0.96 05/06/2013   HGBA1C 5.5 11/23/2020      Assessment / Plan: The CT scan findings were discussed with the patient and she was relieved.  We will repeat a noncontrast chest CT scan in 1 year.       Medication Changes: No orders of the defined types were placed in this encounter.   I  spent 10 minutes counseling the patient face to face and 50% or more the  time was spent in counseling and coordination of care. The total time spent in the appointment was 15 minutes.    John Giovanni [facility] 11/29/2020 3:03 PM

## 2020-12-11 ENCOUNTER — Other Ambulatory Visit: Payer: Self-pay | Admitting: Emergency Medicine

## 2020-12-11 DIAGNOSIS — M62838 Other muscle spasm: Secondary | ICD-10-CM

## 2021-01-19 ENCOUNTER — Other Ambulatory Visit: Payer: Self-pay | Admitting: Emergency Medicine

## 2021-01-19 DIAGNOSIS — Z1231 Encounter for screening mammogram for malignant neoplasm of breast: Secondary | ICD-10-CM

## 2021-02-25 ENCOUNTER — Ambulatory Visit: Payer: Medicare Other

## 2021-03-01 ENCOUNTER — Ambulatory Visit
Admission: RE | Admit: 2021-03-01 | Discharge: 2021-03-01 | Disposition: A | Discharge status: Home / Self Care | Payer: Medicare Other | Source: Ambulatory Visit | Attending: Emergency Medicine | Admitting: Emergency Medicine

## 2021-03-01 DIAGNOSIS — Z1231 Encounter for screening mammogram for malignant neoplasm of breast: Secondary | ICD-10-CM

## 2021-03-14 ENCOUNTER — Other Ambulatory Visit: Payer: Self-pay | Admitting: Emergency Medicine

## 2021-03-14 DIAGNOSIS — M62838 Other muscle spasm: Secondary | ICD-10-CM

## 2021-05-16 ENCOUNTER — Other Ambulatory Visit: Payer: Self-pay | Admitting: Emergency Medicine

## 2021-05-16 DIAGNOSIS — Z8673 Personal history of transient ischemic attack (TIA), and cerebral infarction without residual deficits: Secondary | ICD-10-CM

## 2021-05-23 ENCOUNTER — Encounter: Payer: Self-pay | Admitting: Emergency Medicine

## 2021-05-23 ENCOUNTER — Ambulatory Visit (INDEPENDENT_AMBULATORY_CARE_PROVIDER_SITE_OTHER): Payer: Medicare Other | Admitting: Emergency Medicine

## 2021-05-23 VITALS — BP 110/64 | HR 56 | Temp 97.9°F | Ht 61.0 in | Wt 156.5 lb

## 2021-05-23 DIAGNOSIS — I129 Hypertensive chronic kidney disease with stage 1 through stage 4 chronic kidney disease, or unspecified chronic kidney disease: Secondary | ICD-10-CM | POA: Diagnosis not present

## 2021-05-23 DIAGNOSIS — I700 Atherosclerosis of aorta: Secondary | ICD-10-CM

## 2021-05-23 DIAGNOSIS — I2510 Atherosclerotic heart disease of native coronary artery without angina pectoris: Secondary | ICD-10-CM | POA: Diagnosis not present

## 2021-05-23 DIAGNOSIS — J439 Emphysema, unspecified: Secondary | ICD-10-CM

## 2021-05-23 DIAGNOSIS — N184 Chronic kidney disease, stage 4 (severe): Secondary | ICD-10-CM

## 2021-05-23 DIAGNOSIS — Z8673 Personal history of transient ischemic attack (TIA), and cerebral infarction without residual deficits: Secondary | ICD-10-CM

## 2021-05-23 DIAGNOSIS — Z7901 Long term (current) use of anticoagulants: Secondary | ICD-10-CM | POA: Diagnosis not present

## 2021-05-23 DIAGNOSIS — Z85118 Personal history of other malignant neoplasm of bronchus and lung: Secondary | ICD-10-CM

## 2021-05-23 LAB — COMPREHENSIVE METABOLIC PANEL
ALT: 17 U/L (ref 0–35)
AST: 24 U/L (ref 0–37)
Albumin: 4.2 g/dL (ref 3.5–5.2)
Alkaline Phosphatase: 64 U/L (ref 39–117)
BUN: 26 mg/dL — ABNORMAL HIGH (ref 6–23)
CO2: 28 mEq/L (ref 19–32)
Calcium: 9.9 mg/dL (ref 8.4–10.5)
Chloride: 106 mEq/L (ref 96–112)
Creatinine, Ser: 1.81 mg/dL — ABNORMAL HIGH (ref 0.40–1.20)
GFR: 25.48 mL/min — ABNORMAL LOW (ref >60.00)
Glucose, Bld: 89 mg/dL (ref 70–99)
Potassium: 4.1 mEq/L (ref 3.5–5.1)
Sodium: 141 mEq/L (ref 135–145)
Total Bilirubin: 0.5 mg/dL (ref 0.2–1.2)
Total Protein: 7.3 g/dL (ref 6.0–8.3)

## 2021-05-23 LAB — CBC WITH DIFFERENTIAL/PLATELET
Basophils Absolute: 0.1 10*3/uL (ref 0.0–0.1)
Basophils Relative: 1 % (ref 0.0–3.0)
Eosinophils Absolute: 0.4 10*3/uL (ref 0.0–0.7)
Eosinophils Relative: 7.5 % — ABNORMAL HIGH (ref 0.0–5.0)
HCT: 36.7 % (ref 36.0–46.0)
Hemoglobin: 12.5 g/dL (ref 12.0–15.0)
Lymphocytes Relative: 28.1 % (ref 12.0–46.0)
Lymphs Abs: 1.5 10*3/uL (ref 0.7–4.0)
MCHC: 34 g/dL (ref 30.0–36.0)
MCV: 92.1 fl (ref 78.0–100.0)
Monocytes Absolute: 0.7 10*3/uL (ref 0.1–1.0)
Monocytes Relative: 12.5 % — ABNORMAL HIGH (ref 3.0–12.0)
Neutro Abs: 2.7 10*3/uL (ref 1.4–7.7)
Neutrophils Relative %: 50.9 % (ref 43.0–77.0)
Platelets: 233 10*3/uL (ref 150.0–400.0)
RBC: 3.99 Mil/uL (ref 3.87–5.11)
RDW: 14.3 % (ref 11.5–15.5)
WBC: 5.3 10*3/uL (ref 4.0–10.5)

## 2021-05-23 LAB — LIPID PANEL
Cholesterol: 166 mg/dL (ref 0–200)
HDL: 52.4 mg/dL (ref >39.00)
LDL Cholesterol: 96 mg/dL (ref 0–99)
NonHDL: 113.31
Total CHOL/HDL Ratio: 3
Triglycerides: 89 mg/dL (ref 0.0–149.0)
VLDL: 17.8 mg/dL (ref 0.0–40.0)

## 2021-05-23 NOTE — Assessment & Plan Note (Signed)
No anginal episodes.  Stable. ?Continue beta-blocker with metoprolol succinate 25 mg twice a day. ?Continue Plavix 75 mg daily ?

## 2021-05-23 NOTE — Patient Instructions (Signed)
Health Maintenance After Age 84 After age 84, you are at a higher risk for certain long-term diseases and infections as well as injuries from falls. Falls are a major cause of broken bones and head injuries in people who are older than age 84. Getting regular preventive care can help to keep you healthy and well. Preventive care includes getting regular testing and making lifestyle changes as recommended by your health care provider. Talk with your health care provider about: Which screenings and tests you should have. A screening is a test that checks for a disease when you have no symptoms. A diet and exercise plan that is right for you. What should I know about screenings and tests to prevent falls? Screening and testing are the best ways to find a health problem early. Early diagnosis and treatment give you the best chance of managing medical conditions that are common after age 84. Certain conditions and lifestyle choices may make you more likely to have a fall. Your health care provider may recommend: Regular vision checks. Poor vision and conditions such as cataracts can make you more likely to have a fall. If you wear glasses, make sure to get your prescription updated if your vision changes. Medicine review. Work with your health care provider to regularly review all of the medicines you are taking, including over-the-counter medicines. Ask your health care provider about any side effects that may make you more likely to have a fall. Tell your health care provider if any medicines that you take make you feel dizzy or sleepy. Strength and balance checks. Your health care provider may recommend certain tests to check your strength and balance while standing, walking, or changing positions. Foot health exam. Foot pain and numbness, as well as not wearing proper footwear, can make you more likely to have a fall. Screenings, including: Osteoporosis screening. Osteoporosis is a condition that causes  the bones to get weaker and break more easily. Blood pressure screening. Blood pressure changes and medicines to control blood pressure can make you feel dizzy. Depression screening. You may be more likely to have a fall if you have a fear of falling, feel depressed, or feel unable to do activities that you used to do. Alcohol use screening. Using too much alcohol can affect your balance and may make you more likely to have a fall. Follow these instructions at home: Lifestyle Do not drink alcohol if: Your health care provider tells you not to drink. If you drink alcohol: Limit how much you have to: 0-1 drink a day for women. 0-2 drinks a day for men. Know how much alcohol is in your drink. In the [place] one drink equals one 12 oz bottle of beer (355 mL), one 5 oz glass of wine (148 mL), or one 1 oz glass of hard liquor (44 mL). Do not use any products that contain nicotine or tobacco. These products include cigarettes, chewing tobacco, and vaping devices, such as e-cigarettes. If you need help quitting, ask your health care provider. Activity  Follow a regular exercise program to stay fit. This will help you maintain your balance. Ask your health care provider what types of exercise are appropriate for you. If you need a cane or walker, use it as recommended by your health care provider. Wear supportive shoes that have nonskid soles. Safety  Remove any tripping hazards, such as rugs, cords, and clutter. Install safety equipment such as grab bars in bathrooms and safety rails on stairs. Keep rooms and walkways   well-lit. General instructions Talk with your health care provider about your risks for falling. Tell your health care provider if: You fall. Be sure to tell your health care provider about all falls, even ones that seem minor. You feel dizzy, tiredness (fatigue), or off-balance. Take over-the-counter and prescription medicines only as told by your health care provider. These include  supplements. Eat a healthy diet and maintain a healthy weight. A healthy diet includes low-fat dairy products, low-fat (lean) meats, and fiber from whole grains, beans, and lots of fruits and vegetables. Stay current with your vaccines. Schedule regular health, dental, and eye exams. Summary Having a healthy lifestyle and getting preventive care can help to protect your health and wellness after age 84. Screening and testing are the best way to find a health problem early and help you avoid having a fall. Early diagnosis and treatment give you the best chance for managing medical conditions that are more common for people who are older than age 84. Falls are a major cause of broken bones and head injuries in people who are older than age 84. Take precautions to prevent a fall at home. Work with your health care provider to learn what changes you can make to improve your health and wellness and to prevent falls. This information is not intended to replace advice given to you by your health care provider. Make sure you discuss any questions you have with your health care provider. Document Revised: 05/31/2020 Document Reviewed: 05/31/2020 Elsevier Patient Education  2023 [employer] Inc[employer]  

## 2021-05-23 NOTE — Assessment & Plan Note (Signed)
On Plavix 75 mg daily. ?

## 2021-05-23 NOTE — Assessment & Plan Note (Signed)
Stable

## 2021-05-23 NOTE — Assessment & Plan Note (Signed)
Fall precautions given.  Advised to avoid NSAIDs. ?

## 2021-05-23 NOTE — Assessment & Plan Note (Addendum)
Well-controlled hypertension.  Continue telmisartan 40 mg and chlorthalidone 25 mg daily. ?BP Readings from Last 3 Encounters:  ?05/23/21 110/64  ?11/29/20 (!) 150/73  ?11/23/20 136/76  ?Stable kidney function. ?Advised to stay well-hydrated and avoid NSAIDs. ? ?

## 2021-05-23 NOTE — Assessment & Plan Note (Signed)
Stable.  On atorvastatin 10 mg daily. ?

## 2021-05-23 NOTE — Progress Notes (Signed)
[name] ?86 y.o. ? ? ?Chief Complaint  ?Patient presents with  ? Follow-up  ?  No concerns  ? Hip Pain  ?  Left hip pain   ? ? ?HISTORY OF PRESENT ILLNESS: ?This is a 86 y.o. female A1A with a history of hypertension and chronic kidney disease here for follow-up. ?Also has history of TIA and CAD. ?Doing well.  Has no complaints or medical concerns. ?Occasionally gets pain to right knee. ? ?Hip Pain  ? ? ? ?Prior to Admission medications   ?Medication Sig Start Date End Date Taking? Authorizing Provider  ?acetaminophen (TYLENOL) 500 MG tablet Take 1 tablet (500 mg total) by mouth every 6 (six) hours as needed. 01/15/17  Yes Augusto Gamble B, NP  ?atorvastatin (LIPITOR) 10 MG tablet TAKE 1 TABLET (10 MG TOTAL) BY MOUTH DAILY AT 6 PM. 06/14/20  Yes Zacharius Funari, Ines Bloomer, MD  ?chlorthalidone (HYGROTON) 25 MG tablet TAKE 1 TABLET BY MOUTH EVERY DAY 07/13/20  Yes Shakoya Gilmore, Ines Bloomer, MD  ?Cholecalciferol (VITAMIN D-3) 1000 units CAPS Take 1 capsule (1,000 Units total) by mouth daily. 08/02/17  Yes Delia Chimes A, MD  ?clopidogrel (PLAVIX) 75 MG tablet TAKE 1 TABLET BY MOUTH DAILY WITH BREAKFAST. 05/16/21  Yes Yogesh Cominsky, Ines Bloomer, MD  ?gabapentin (NEURONTIN) 300 MG capsule Take 300 mg by mouth daily.   Yes [provider]  ?magnesium oxide (MAG-OX) 400 MG tablet Take 1 tablet (400 mg total) by mouth daily. 08/02/17  Yes Stallings, Zoe A, MD  ?methocarbamol (ROBAXIN) 500 MG tablet TAKE 1 TABLET BY MOUTH AT BEDTIME AS NEEDED FOR MUSCLE SPASMS. 03/14/21  Yes Breeona Waid, Ines Bloomer, MD  ?metoprolol succinate (TOPROL-XL) 25 MG 24 hr tablet TAKE 1 TABLET (25 MG) BY ORAL ROUTE TWICE A DAY 07/13/20  Yes Caylor Tallarico, Ines Bloomer, MD  ?Multiple Vitamin (MULTIVITAMIN) tablet Take 1 tablet by mouth at bedtime.    Yes [provider]  ?telmisartan (MICARDIS) 40 MG tablet TAKE 1 TABLET BY MOUTH EVERY DAY 07/13/20  Yes Horald Pollen, MD  ? ? ?Allergies  ?Allergen Reactions  ? Aspirin Other (See Comments)  ?   REACTION: stomach upset  ? Lactose Intolerance (Gi) Diarrhea and Other (See Comments)  ?  "can't drink milk. It tears my stomach up."  ? Adhesive [Tape] Rash  ? Lisinopril Other (See Comments)  ?  cough  ? Percocet [Oxycodone-Acetaminophen] Itching  ? ? ?Patient Active Problem List  ? Diagnosis Date Noted  ? Atherosclerosis of aorta ([place]) 06/14/2020  ? Pulmonary emphysema ([place]) 06/14/2020  ? Atherosclerosis of native coronary artery of native heart without angina pectoris 06/14/2020  ? Malignant neoplasm of unspecified part of unspecified bronchus or lung ([place]) 12/16/2019  ? Sacroiliac joint disease 08/02/2017  ? History of TIA (transient ischemic attack) 08/02/2017  ? Vitamin D deficiency 08/02/2017  ? Arthritis 08/02/2017  ? Long term current use of anticoagulant therapy 08/02/2017  ? Left thyroid nodule 01/01/2015  ? Benign hypertension with CKD (chronic kidney disease) stage IV ([place]) 11/06/2013  ? Anemia in chronic kidney disease 05/28/2013  ? History of lung cancer 04/07/2013  ? Essential hypertension, benign 03/05/2013  ? Personal history of colonic polyps 10/29/2012  ? ? ?Past Medical History:  ?Diagnosis Date  ? Arthritis   ? Benign hypertensive kidney disease with chronic kidney disease stage I through stage IV, or unspecified(403.10)   ? sees dr. Justin Mend again in april  ? Cancer [facility])   ? lung ca dx'd 02/2013  ? Chronic  kidney disease, stage III (moderate) (H[facility])   ? Cough 02/26/2013  ? GERD (gastroesophageal reflux disease)   ? Headache   ? Hypertension   ? Hyperthyroidism   ? took iodine treatment for this  ? Lung mass 02/27/2013  ? Neurologic disorder   ? 2009  ? Shortness of breath   ? exertion  ? Thrombocytopenia, unspecified ([place]) 02/26/2013  ? Unspecified vitamin D deficiency   ? ? ?Past Surgical History:  ?Procedure Laterality Date  ? BACK SURGERY    ? EYE SURGERY    ? cataract  ? EYE SURGERY Left   ? open up tear duct  ? LYMPH NODE DISSECTION Left 04/07/2013  ? Procedure: LYMPH NODE DISSECTION;  Surgeon:  [name], MD;  Location: [facility];  Service: Thoracic;  Laterality: Left;  ? NECK SURGERY    ? PORT-A-CATH REMOVAL Left 01/09/2014  ? Procedure: REMOVAL PORT-A-CATH;  Surgeon: [name], MD;  Location: [facility];  Service: Thoracic;  Laterality: Left;  ? PORTACATH PLACEMENT Left 05/06/2013  ? Procedure: INSERTION PORT-A-CATH;  Surgeon: [name], MD;  Location: [facility];  Service: Thoracic;  Laterality: Left;  ? VIDEO ASSISTED THORACOSCOPY (VATS)/ LOBECTOMY Left 04/07/2013  ? Procedure: VIDEO ASSISTED THORACOSCOPY (VATS)/ LOBECTOMY; INSERTION OF ON-Q PUMP;  Surgeon: [name], MD;  Location: [facility];  Service: Thoracic;  Laterality: Left;  ? VIDEO BRONCHOSCOPY N/A 04/07/2013  ? Procedure: VIDEO BRONCHOSCOPY;  Surgeon: [name], MD;  Location: [facility];  Service: Thoracic;  Laterality: N/A;  ? ? ?Social History  ? ?Socioeconomic History  ? Marital status: Single  ?  Spouse name: Not on file  ? Number of children: 2  ? Years of education: Not on file  ? Highest education level: Not on file  ?Occupational History  ? Not on file  ?Tobacco Use  ? Smoking status: Former  ?  Packs/day: 0.50  ?  Years: 60.00  ?  Pack years: 30.00  ?  Types: Cigarettes  ?  Quit date: 02/25/2012  ?  Years since quitting: 9.2  ? Smokeless tobacco: Never  ?Substance and Sexual Activity  ? Alcohol use: No  ? Drug use: No  ? Sexual activity: Not on file  ?Other Topics Concern  ? Not on file  ?Social History Narrative  ? Not on file  ? ?Social Determinants of Health  ? ?Financial Resource Strain: Low Risk   ? Difficulty of Paying Living Expenses: Not hard at all  ?Food Insecurity: No Food Insecurity  ? Worried About Charity fundraiser in the Last Year: Never true  ? Ran Out of Food in the Last Year: Never true  ?Transportation Needs: No Transportation Needs  ? Lack of Transportation (Medical): No  ? Lack of Transportation (Non-Medical): No  ?Physical Activity: Sufficiently Active  ? Days of Exercise per Week: 5 days  ?  Minutes of Exercise per Session: 30 min  ?Stress: No Stress Concern Present  ? Feeling of Stress : Not at all  ?Social Connections: Moderately Integrated  ? Frequency of Communication with Friends and Family: More than three times a week  ? Frequency of Social Gatherings with Friends and Family: More than three times a week  ? Attends Religious Services: More than 4 times per year  ? Active Member of Clubs or Organizations: Yes  ? Attends Archivist Meetings: More than 4 times per year  ? Marital Status: Never married  ?Intimate Partner Violence: Not At Risk  ?  Fear of Current or Ex-Partner: No  ? Emotionally Abused: No  ? Physically Abused: No  ? Sexually Abused: No  ? ? ?Family History  ?Problem Relation Age of Onset  ? Lung cancer Brother   ?     smoked  ? Breast cancer Mother   ? Lung cancer Father   ?     smoked  ? Rectal cancer Neg Hx   ? Stomach cancer Neg Hx   ? Esophageal cancer Neg Hx   ? Colon cancer Neg Hx   ? ? ? ?Review of Systems  ?Constitutional: Negative.  Negative for chills and fever.  ?HENT: Negative.  Negative for congestion.   ?Respiratory: Negative.  Negative for cough and shortness of breath.   ?Cardiovascular: Negative.  Negative for chest pain and palpitations.  ?Gastrointestinal:  Negative for abdominal pain, diarrhea, nausea and vomiting.  ?Skin: Negative.  Negative for rash.  ?Neurological: Negative.  Negative for dizziness and headaches.  ? ?Today's Vitals  ? 05/23/21 1306  ?BP: 110/64  ?Pulse: (!) 56  ?Temp: 97.9 ?F (36.6 ?C)  ?TempSrc: Oral  ?SpO2: 98%  ?Weight: 156 lb 8 oz (71 kg)  ?Height: 5\' 1"  (1.549 m)  ? ?Body mass index is 29.57 kg/m?. ? ?Physical Exam ?Vitals reviewed.  ?Constitutional:   ?   Appearance: Normal appearance.  ?HENT:  ?   Head: Normocephalic.  ?Eyes:  ?   Extraocular Movements: Extraocular movements intact.  ?   Pupils: Pupils are equal, round, and reactive to light.  ?Cardiovascular:  ?   Rate and Rhythm: Normal rate and regular rhythm.  ?   Pulses:  Normal pulses.  ?   Heart sounds: Normal heart sounds.  ?Pulmonary:  ?   Effort: Pulmonary effort is normal.  ?   Breath sounds: Normal breath sounds.  ?Musculoskeletal:     ?   General: No deformity.  ?   [place]

## 2021-06-15 ENCOUNTER — Other Ambulatory Visit: Payer: Self-pay | Admitting: Emergency Medicine

## 2021-06-15 DIAGNOSIS — M62838 Other muscle spasm: Secondary | ICD-10-CM

## 2021-06-22 ENCOUNTER — Other Ambulatory Visit: Payer: Self-pay | Admitting: Emergency Medicine

## 2021-06-22 DIAGNOSIS — Z8639 Personal history of other endocrine, nutritional and metabolic disease: Secondary | ICD-10-CM

## 2021-07-17 ENCOUNTER — Other Ambulatory Visit: Payer: Self-pay | Admitting: Emergency Medicine

## 2021-07-17 DIAGNOSIS — I129 Hypertensive chronic kidney disease with stage 1 through stage 4 chronic kidney disease, or unspecified chronic kidney disease: Secondary | ICD-10-CM

## 2021-08-25 ENCOUNTER — Other Ambulatory Visit: Payer: Self-pay | Admitting: Emergency Medicine

## 2021-08-25 DIAGNOSIS — I129 Hypertensive chronic kidney disease with stage 1 through stage 4 chronic kidney disease, or unspecified chronic kidney disease: Secondary | ICD-10-CM

## 2021-09-22 ENCOUNTER — Other Ambulatory Visit: Payer: Self-pay | Admitting: Emergency Medicine

## 2021-09-22 DIAGNOSIS — M62838 Other muscle spasm: Secondary | ICD-10-CM

## 2021-10-19 ENCOUNTER — Other Ambulatory Visit: Payer: Self-pay | Admitting: *Deleted

## 2021-10-19 DIAGNOSIS — Z85118 Personal history of other malignant neoplasm of bronchus and lung: Secondary | ICD-10-CM

## 2021-11-07 ENCOUNTER — Telehealth: Payer: Self-pay | Admitting: Emergency Medicine

## 2021-11-07 NOTE — Telephone Encounter (Signed)
LVM for pt to rtn my call to schedule Awv with [facility] call back # [phone]

## 2021-11-25 NOTE — Progress Notes (Unsigned)
      [address]       [place] [address]             [phone]       HPI:  [name] is an 86 yo AA female with history of Lung Cancer, S/P Resection performed by Dr. Servando Snare back in 2015.  She presents today for 1 year follow up.   Current Outpatient Medications  Medication Sig Dispense Refill   acetaminophen (TYLENOL) 500 MG tablet Take 1 tablet (500 mg total) by mouth every 6 (six) hours as needed. 30 tablet 0   atorvastatin (LIPITOR) 10 MG tablet TAKE 1 TABLET (10 MG TOTAL) BY MOUTH DAILY AT 6 PM. 90 tablet 3   chlorthalidone (HYGROTON) 25 MG tablet TAKE 1 TABLET BY MOUTH EVERY DAY 90 tablet 3   Cholecalciferol (VITAMIN D-3) 1000 units CAPS Take 1 capsule (1,000 Units total) by mouth daily. 90 capsule 1   clopidogrel (PLAVIX) 75 MG tablet TAKE 1 TABLET BY MOUTH DAILY WITH BREAKFAST. 90 tablet 2   gabapentin (NEURONTIN) 300 MG capsule Take 300 mg by mouth daily.     magnesium oxide (MAG-OX) 400 MG tablet Take 1 tablet (400 mg total) by mouth daily. 90 tablet 1   methocarbamol (ROBAXIN) 500 MG tablet TAKE 1 TABLET BY MOUTH AT BEDTIME AS NEEDED FOR MUSCLE SPASMS. 30 tablet 2   metoprolol succinate (TOPROL-XL) 25 MG 24 hr tablet TAKE 1 TABLET (25 MG) BY ORAL ROUTE TWICE A DAY 180 tablet 3   Multiple Vitamin (MULTIVITAMIN) tablet Take 1 tablet by mouth at bedtime.      telmisartan (MICARDIS) 40 MG tablet TAKE 1 TABLET BY MOUTH EVERY DAY 90 tablet 3   No current facility-administered medications for this visit.    Physical Exam: ***  Diagnostic Tests: ***   A/P:  [name] is an 86 yo AA female with history of Non- Small Cell Lung Cancer Stage 1B diagnosed in 2015.  She has been monitored per guidelines since that time.  She again presents for 1 year follow up with CT scan.  This shows ***.  Ellwood Handler, PA-C [facility] [phone]

## 2021-11-28 ENCOUNTER — Inpatient Hospital Stay: Admission: RE | Admit: 2021-11-28 | Payer: Medicare Other | Source: Ambulatory Visit

## 2021-11-28 ENCOUNTER — Ambulatory Visit (INDEPENDENT_AMBULATORY_CARE_PROVIDER_SITE_OTHER): Payer: Medicare Other | Admitting: Emergency Medicine

## 2021-11-28 ENCOUNTER — Ambulatory Visit: Payer: Medicare Other | Admitting: Physician Assistant

## 2021-11-28 ENCOUNTER — Encounter: Payer: Self-pay | Admitting: Emergency Medicine

## 2021-11-28 ENCOUNTER — Ambulatory Visit
Admission: RE | Admit: 2021-11-28 | Discharge: 2021-11-28 | Disposition: A | Discharge status: Home / Self Care | Payer: Medicare Other | Source: Ambulatory Visit | Attending: Thoracic Surgery (Cardiothoracic Vascular Surgery) | Admitting: Thoracic Surgery (Cardiothoracic Vascular Surgery)

## 2021-11-28 VITALS — BP 112/82 | HR 50 | Temp 98.2°F | Ht 61.0 in | Wt 144.2 lb

## 2021-11-28 DIAGNOSIS — I2510 Atherosclerotic heart disease of native coronary artery without angina pectoris: Secondary | ICD-10-CM | POA: Diagnosis not present

## 2021-11-28 DIAGNOSIS — D631 Anemia in chronic kidney disease: Secondary | ICD-10-CM

## 2021-11-28 DIAGNOSIS — N184 Chronic kidney disease, stage 4 (severe): Secondary | ICD-10-CM

## 2021-11-28 DIAGNOSIS — Z8673 Personal history of transient ischemic attack (TIA), and cerebral infarction without residual deficits: Secondary | ICD-10-CM

## 2021-11-28 DIAGNOSIS — C3490 Malignant neoplasm of unspecified part of unspecified bronchus or lung: Secondary | ICD-10-CM

## 2021-11-28 DIAGNOSIS — E559 Vitamin D deficiency, unspecified: Secondary | ICD-10-CM

## 2021-11-28 DIAGNOSIS — I10 Essential (primary) hypertension: Secondary | ICD-10-CM | POA: Diagnosis not present

## 2021-11-28 DIAGNOSIS — J432 Centrilobular emphysema: Secondary | ICD-10-CM

## 2021-11-28 DIAGNOSIS — I700 Atherosclerosis of aorta: Secondary | ICD-10-CM

## 2021-11-28 DIAGNOSIS — Z85118 Personal history of other malignant neoplasm of bronchus and lung: Secondary | ICD-10-CM

## 2021-11-28 DIAGNOSIS — I129 Hypertensive chronic kidney disease with stage 1 through stage 4 chronic kidney disease, or unspecified chronic kidney disease: Secondary | ICD-10-CM

## 2021-11-28 DIAGNOSIS — Z7901 Long term (current) use of anticoagulants: Secondary | ICD-10-CM

## 2021-11-28 NOTE — Progress Notes (Signed)
Conita Amenta Mooney 86 y.o.   Chief Complaint  Patient presents with   Follow-up    6 mnth f/u appt,      HISTORY OF PRESENT ILLNESS: This is a 86 y.o. female here for follow-up of chronic medical problems. Overall doing well.  No complaints or medical concerns today. Follow-up CT of chest done today.  Report reviewed with patient.  No new findings.  HPI   Prior to Admission medications   Medication Sig Start Date End Date Taking? Authorizing Provider  acetaminophen (TYLENOL) 500 MG tablet Take 1 tablet (500 mg total) by mouth every 6 (six) hours as needed. 01/15/17   Zigmund Gottron, NP  atorvastatin (LIPITOR) 10 MG tablet TAKE 1 TABLET (10 MG TOTAL) BY MOUTH DAILY AT 6 PM. 06/22/21   Horald Pollen, MD  chlorthalidone (HYGROTON) 25 MG tablet TAKE 1 TABLET BY MOUTH EVERY DAY 07/17/21   Horald Pollen, MD  Cholecalciferol (VITAMIN D-3) 1000 units CAPS Take 1 capsule (1,000 Units total) by mouth daily. 08/02/17   Forrest Moron, MD  clopidogrel (PLAVIX) 75 MG tablet TAKE 1 TABLET BY MOUTH DAILY WITH BREAKFAST. 05/16/21   Horald Pollen, MD  gabapentin (NEURONTIN) 300 MG capsule Take 300 mg by mouth daily.    [provider]  magnesium oxide (MAG-OX) 400 MG tablet Take 1 tablet (400 mg total) by mouth daily. 08/02/17   Forrest Moron, MD  methocarbamol (ROBAXIN) 500 MG tablet TAKE 1 TABLET BY MOUTH AT BEDTIME AS NEEDED FOR MUSCLE SPASMS. 09/22/21   Horald Pollen, MD  metoprolol succinate (TOPROL-XL) 25 MG 24 hr tablet TAKE 1 TABLET (25 MG) BY ORAL ROUTE TWICE A DAY 08/25/21   Horald Pollen, MD  Multiple Vitamin (MULTIVITAMIN) tablet Take 1 tablet by mouth at bedtime.     [provider]  telmisartan (MICARDIS) 40 MG tablet TAKE 1 TABLET BY MOUTH EVERY DAY 07/17/21   Horald Pollen, MD    Allergies  Allergen Reactions   Aspirin Other (See Comments)    REACTION: stomach upset   Lactose Intolerance (Gi) Diarrhea and Other (See  Comments)    "can't drink milk. It tears my stomach up."   Adhesive [Tape] Rash   Lisinopril Other (See Comments)    cough   Percocet [Oxycodone-Acetaminophen] Itching    Patient Active Problem List   Diagnosis Date Noted   Atherosclerosis of aorta ([place]) 06/14/2020   Pulmonary emphysema ([place]) 06/14/2020   Atherosclerosis of native coronary artery of native heart without angina pectoris 06/14/2020   Malignant neoplasm of unspecified part of unspecified bronchus or lung ([place]) 12/16/2019   Sacroiliac joint disease 08/02/2017   History of TIA (transient ischemic attack) 08/02/2017   Vitamin D deficiency 08/02/2017   Arthritis 08/02/2017   Long term current use of anticoagulant therapy 08/02/2017   Left thyroid nodule 01/01/2015   Benign hypertension with CKD (chronic kidney disease) stage IV ([place]) 11/06/2013   Anemia in chronic kidney disease 05/28/2013   History of lung cancer 04/07/2013   Essential hypertension, benign 03/05/2013   Personal history of colonic polyps 10/29/2012    Past Medical History:  Diagnosis Date   Arthritis    Benign hypertensive kidney disease with chronic kidney disease stage I through stage IV, or unspecified(403.10)    sees dr. Justin Mend again in april   Cancer [facility])    lung ca dx'd 02/2013   Chronic kidney disease, stage III (moderate) ([place])    Cough 02/26/2013  GERD (gastroesophageal reflux disease)    Headache    Hypertension    Hyperthyroidism    took iodine treatment for this   Lung mass 02/27/2013   Neurologic disorder    2009   Shortness of breath    exertion   Thrombocytopenia, unspecified ([place]) 02/26/2013   Unspecified vitamin D deficiency     Past Surgical History:  Procedure Laterality Date   BACK SURGERY     EYE SURGERY     cataract   EYE SURGERY Left    open up tear duct   LYMPH NODE DISSECTION Left 04/07/2013   Procedure: LYMPH NODE DISSECTION;  Surgeon: [name], MD;  Location: [place];  Service: Thoracic;  Laterality: Left;    NECK SURGERY     PORT-A-CATH REMOVAL Left 01/09/2014   Procedure: REMOVAL PORT-A-CATH;  Surgeon: [name], MD;  Location: [place];  Service: Thoracic;  Laterality: Left;   PORTACATH PLACEMENT Left 05/06/2013   Procedure: INSERTION PORT-A-CATH;  Surgeon: [name], MD;  Location: [place];  Service: Thoracic;  Laterality: Left;   VIDEO ASSISTED THORACOSCOPY (VATS)/ LOBECTOMY Left 04/07/2013   Procedure: VIDEO ASSISTED THORACOSCOPY (VATS)/ LOBECTOMY; INSERTION OF ON-Q PUMP;  Surgeon: [name], MD;  Location: [facility];  Service: Thoracic;  Laterality: Left;   VIDEO BRONCHOSCOPY N/A 04/07/2013   Procedure: VIDEO BRONCHOSCOPY;  Surgeon: [name], MD;  Location: [facility] OR;  Service: Thoracic;  Laterality: N/A;    Social History   Socioeconomic History   Marital status: Single    Spouse name: Not on file   Number of children: 2   Years of education: Not on file   Highest education level: Not on file  Occupational History   Not on file  Tobacco Use   Smoking status: Former    Packs/day: 0.50    Years: 60.00    Total pack years: 30.00    Types: Cigarettes    Quit date: 02/25/2012    Years since quitting: 9.7   Smokeless tobacco: Never  Substance and Sexual Activity   Alcohol use: No   Drug use: No   Sexual activity: Not on file  Other Topics Concern   Not on file  Social History Narrative   Not on file   Social Determinants of Health   Financial Resource Strain: Low Risk  (11/11/2020)   Overall Financial Resource Strain (CARDIA)    Difficulty of Paying Living Expenses: Not hard at all  Food Insecurity: No Food Insecurity (11/11/2020)   Hunger Vital Sign    Worried About Running Out of Food in the Last Year: Never true    Ran Out of Food in the Last Year: Never true  Transportation Needs: No Transportation Needs (11/11/2020)   PRAPARE - Hydrologist (Medical): No    Lack of Transportation (Non-Medical): No  Physical Activity:  Sufficiently Active (11/11/2020)   Exercise Vital Sign    Days of Exercise per Week: 5 days    Minutes of Exercise per Session: 30 min  Stress: No Stress Concern Present (11/11/2020)   [place]    Feeling of Stress : Not at all  Social Connections: Moderately Integrated (11/11/2020)   Social Connection and Isolation Panel [NHANES]    Frequency of Communication with Friends and Family: More than three times a week    Frequency of Social Gatherings with Friends and Family: More than three times a week  Attends Religious Services: More than 4 times per year    Active Member of Clubs or Organizations: Yes    Attends Archivist Meetings: More than 4 times per year    Marital Status: Never married  Intimate Partner Violence: Not At Risk (11/11/2020)   Humiliation, Afraid, Rape, and Kick questionnaire    Fear of Current or Ex-Partner: No    Emotionally Abused: No    Physically Abused: No    Sexually Abused: No    Family History  Problem Relation Age of Onset   Lung cancer Brother        smoked   Breast cancer Mother    Lung cancer Father        smoked   Rectal cancer Neg Hx    Stomach cancer Neg Hx    Esophageal cancer Neg Hx    Colon cancer Neg Hx      Review of Systems  Constitutional: Negative.  Negative for chills and fever.  HENT: Negative.  Negative for congestion and sore throat.   Respiratory: Negative.  Negative for cough and shortness of breath.   Cardiovascular: Negative.  Negative for chest pain and palpitations.  Gastrointestinal:  Negative for abdominal pain, diarrhea, nausea and vomiting.  Skin: Negative.  Negative for rash.  Neurological: Negative.  Negative for dizziness and headaches.  All other systems reviewed and are negative.  Vitals:   11/28/21 1307  BP: 112/82  Pulse: (!) 50  Temp: 98.2 F (36.8 C)  SpO2: 96%     Physical Exam Vitals reviewed.  Constitutional:       Appearance: Normal appearance.  HENT:     Head: Normocephalic.     Mouth/Throat:     Mouth: Mucous membranes are moist.     Pharynx: Oropharynx is clear.  Eyes:     Extraocular Movements: Extraocular movements intact.     Pupils: Pupils are equal, round, and reactive to light.  Cardiovascular:     Rate and Rhythm: Normal rate and regular rhythm.     Pulses: Normal pulses.     Heart sounds: Normal heart sounds.  Pulmonary:     Effort: Pulmonary effort is normal.     Breath sounds: Normal breath sounds.  Musculoskeletal:     Cervical back: No tenderness.  Lymphadenopathy:     Cervical: No cervical adenopathy.  Skin:    General: Skin is warm and dry.  Neurological:     General: No focal deficit present.     Mental Status: She is alert and oriented to person, place, and time.  Psychiatric:        Mood and Affect: Mood normal.        Behavior: Behavior normal.      ASSESSMENT & PLAN: A total of 47 minutes was spent with the patient and counseling/coordination of care regarding preparing for this visit, review of most recent office visit notes, review of most recent blood work results, review of today's chest CT scan report, review of multiple chronic medical conditions and their management, review of all medications, education on nutrition, prognosis, documentation, and need for follow-up.  Problem List Items Addressed This Visit       Cardiovascular and Mediastinum   Essential hypertension, benign - Primary    Well-controlled hypertension. Continue telmisartan 40 mg daily and metoprolol succinate 25 mg daily Continue chlorthalidone 25 mg daily. BP Readings from Last 3 Encounters:  11/28/21 112/82  05/23/21 110/64  11/29/20 (!) 150/73  Benign hypertension with CKD (chronic kidney disease) stage IV (H[facility])    Advised to stay well-hydrated and avoid NSAIDs.      Atherosclerosis of aorta (H[facility])    Diet and nutrition discussed.  Continue atorvastatin 10 mg  daily.      Atherosclerosis of native coronary artery of native heart without angina pectoris    Stable.  No recent anginal episodes. Continue Plavix 75 mg daily and atorvastatin 10 mg daily.        Respiratory   Malignant neoplasm of unspecified part of unspecified bronchus or lung ([place])    CT scan of chest done today reviewed with patient. No new findings or lung nodules      Pulmonary emphysema (H[facility])    Stable and asymptomatic.        Genitourinary   Anemia in chronic kidney disease     Other   History of TIA (transient ischemic attack)   Vitamin D deficiency   Long term current use of anticoagulant therapy    Fall precautions given. No clinical bleeding episodes      Patient Instructions  Health Maintenance After Age 64 After age 71, you are at a higher risk for certain long-term diseases and infections as well as injuries from falls. Falls are a major cause of broken bones and head injuries in people who are older than age 64. Getting regular preventive care can help to keep you healthy and well. Preventive care includes getting regular testing and making lifestyle changes as recommended by your health care provider. Talk with your health care provider about: Which screenings and tests you should have. A screening is a test that checks for a disease when you have no symptoms. A diet and exercise plan that is right for you. What should I know about screenings and tests to prevent falls? Screening and testing are the best ways to find a health problem early. Early diagnosis and treatment give you the best chance of managing medical conditions that are common after age 83. Certain conditions and lifestyle choices may make you more likely to have a fall. Your health care provider may recommend: Regular vision checks. Poor vision and conditions such as cataracts can make you more likely to have a fall. If you wear glasses, make sure to get your prescription updated if your  vision changes. Medicine review. Work with your health care provider to regularly review all of the medicines you are taking, including over-the-counter medicines. Ask your health care provider about any side effects that may make you more likely to have a fall. Tell your health care provider if any medicines that you take make you feel dizzy or sleepy. Strength and balance checks. Your health care provider may recommend certain tests to check your strength and balance while standing, walking, or changing positions. Foot health exam. Foot pain and numbness, as well as not wearing proper footwear, can make you more likely to have a fall. Screenings, including: Osteoporosis screening. Osteoporosis is a condition that causes the bones to get weaker and break more easily. Blood pressure screening. Blood pressure changes and medicines to control blood pressure can make you feel dizzy. Depression screening. You may be more likely to have a fall if you have a fear of falling, feel depressed, or feel unable to do activities that you used to do. Alcohol use screening. Using too much alcohol can affect your balance and may make you more likely to have a fall. Follow these instructions at home: Lifestyle  Do not drink alcohol if: Your health care provider tells you not to drink. If you drink alcohol: Limit how much you have to: 0-1 drink a day for women. 0-2 drinks a day for men. Know how much alcohol is in your drink. In the [place] one drink equals one 12 oz bottle of beer (355 mL), one 5 oz glass of wine (148 mL), or one 1 oz glass of hard liquor (44 mL). Do not use any products that contain nicotine or tobacco. These products include cigarettes, chewing tobacco, and vaping devices, such as e-cigarettes. If you need help quitting, ask your health care provider. Activity  Follow a regular exercise program to stay fit. This will help you maintain your balance. Ask your health care provider what types of  exercise are appropriate for you. If you need a cane or walker, use it as recommended by your health care provider. Wear supportive shoes that have nonskid soles. Safety  Remove any tripping hazards, such as rugs, cords, and clutter. Install safety equipment such as grab bars in bathrooms and safety rails on stairs. Keep rooms and walkways well-lit. General instructions Talk with your health care provider about your risks for falling. Tell your health care provider if: You fall. Be sure to tell your health care provider about all falls, even ones that seem minor. You feel dizzy, tiredness (fatigue), or off-balance. Take over-the-counter and prescription medicines only as told by your health care provider. These include supplements. Eat a healthy diet and maintain a healthy weight. A healthy diet includes low-fat dairy products, low-fat (lean) meats, and fiber from whole grains, beans, and lots of fruits and vegetables. Stay current with your vaccines. Schedule regular health, dental, and eye exams. Summary Having a healthy lifestyle and getting preventive care can help to protect your health and wellness after age 51. Screening and testing are the best way to find a health problem early and help you avoid having a fall. Early diagnosis and treatment give you the best chance for managing medical conditions that are more common for people who are older than age 3. Falls are a major cause of broken bones and head injuries in people who are older than age 54. Take precautions to prevent a fall at home. Work with your health care provider to learn what changes you can make to improve your health and wellness and to prevent falls. This information is not intended to replace advice given to you by your health care provider. Make sure you discuss any questions you have with your health care provider. Document Revised: 05/31/2020 Document Reviewed: 05/31/2020 Elsevier Patient Education  [place], MD [facility]

## 2021-11-28 NOTE — Assessment & Plan Note (Signed)
Advised to stay well-hydrated and avoid NSAIDs. ?

## 2021-11-28 NOTE — Patient Instructions (Signed)
Health Maintenance After Age 84 After age 84, you are at a higher risk for certain long-term diseases and infections as well as injuries from falls. Falls are a major cause of broken bones and head injuries in people who are older than age 84. Getting regular preventive care can help to keep you healthy and well. Preventive care includes getting regular testing and making lifestyle changes as recommended by your health care provider. Talk with your health care provider about: Which screenings and tests you should have. A screening is a test that checks for a disease when you have no symptoms. A diet and exercise plan that is right for you. What should I know about screenings and tests to prevent falls? Screening and testing are the best ways to find a health problem early. Early diagnosis and treatment give you the best chance of managing medical conditions that are common after age 84. Certain conditions and lifestyle choices may make you more likely to have a fall. Your health care provider may recommend: Regular vision checks. Poor vision and conditions such as cataracts can make you more likely to have a fall. If you wear glasses, make sure to get your prescription updated if your vision changes. Medicine review. Work with your health care provider to regularly review all of the medicines you are taking, including over-the-counter medicines. Ask your health care provider about any side effects that may make you more likely to have a fall. Tell your health care provider if any medicines that you take make you feel dizzy or sleepy. Strength and balance checks. Your health care provider may recommend certain tests to check your strength and balance while standing, walking, or changing positions. Foot health exam. Foot pain and numbness, as well as not wearing proper footwear, can make you more likely to have a fall. Screenings, including: Osteoporosis screening. Osteoporosis is a condition that causes  the bones to get weaker and break more easily. Blood pressure screening. Blood pressure changes and medicines to control blood pressure can make you feel dizzy. Depression screening. You may be more likely to have a fall if you have a fear of falling, feel depressed, or feel unable to do activities that you used to do. Alcohol use screening. Using too much alcohol can affect your balance and may make you more likely to have a fall. Follow these instructions at home: Lifestyle Do not drink alcohol if: Your health care provider tells you not to drink. If you drink alcohol: Limit how much you have to: 0-1 drink a day for women. 0-2 drinks a day for men. Know how much alcohol is in your drink. In the [place] one drink equals one 12 oz bottle of beer (355 mL), one 5 oz glass of wine (148 mL), or one 1 oz glass of hard liquor (44 mL). Do not use any products that contain nicotine or tobacco. These products include cigarettes, chewing tobacco, and vaping devices, such as e-cigarettes. If you need help quitting, ask your health care provider. Activity  Follow a regular exercise program to stay fit. This will help you maintain your balance. Ask your health care provider what types of exercise are appropriate for you. If you need a cane or walker, use it as recommended by your health care provider. Wear supportive shoes that have nonskid soles. Safety  Remove any tripping hazards, such as rugs, cords, and clutter. Install safety equipment such as grab bars in bathrooms and safety rails on stairs. Keep rooms and walkways   well-lit. General instructions Talk with your health care provider about your risks for falling. Tell your health care provider if: You fall. Be sure to tell your health care provider about all falls, even ones that seem minor. You feel dizzy, tiredness (fatigue), or off-balance. Take over-the-counter and prescription medicines only as told by your health care provider. These include  supplements. Eat a healthy diet and maintain a healthy weight. A healthy diet includes low-fat dairy products, low-fat (lean) meats, and fiber from whole grains, beans, and lots of fruits and vegetables. Stay current with your vaccines. Schedule regular health, dental, and eye exams. Summary Having a healthy lifestyle and getting preventive care can help to protect your health and wellness after age 84. Screening and testing are the best way to find a health problem early and help you avoid having a fall. Early diagnosis and treatment give you the best chance for managing medical conditions that are more common for people who are older than age 84. Falls are a major cause of broken bones and head injuries in people who are older than age 84. Take precautions to prevent a fall at home. Work with your health care provider to learn what changes you can make to improve your health and wellness and to prevent falls. This information is not intended to replace advice given to you by your health care provider. Make sure you discuss any questions you have with your health care provider. Document Revised: 05/31/2020 Document Reviewed: 05/31/2020 Elsevier Patient Education  2023 [employer] Inc[employer]  

## 2021-11-28 NOTE — Assessment & Plan Note (Signed)
Fall precautions given. No clinical bleeding episodes

## 2021-11-28 NOTE — Assessment & Plan Note (Signed)
Stable.  No recent anginal episodes. Continue Plavix 75 mg daily and atorvastatin 10 mg daily.

## 2021-11-28 NOTE — Assessment & Plan Note (Signed)
CT scan of chest done today reviewed with patient. No new findings or lung nodules

## 2021-11-28 NOTE — Assessment & Plan Note (Signed)
Well-controlled hypertension. Continue telmisartan 40 mg daily and metoprolol succinate 25 mg daily Continue chlorthalidone 25 mg daily. BP Readings from Last 3 Encounters:  11/28/21 112/82  05/23/21 110/64  11/29/20 (!) 150/73

## 2021-11-28 NOTE — Assessment & Plan Note (Signed)
Diet and nutrition discussed.  Continue atorvastatin 10 mg daily.

## 2021-11-28 NOTE — Assessment & Plan Note (Signed)
Stable and asymptomatic  

## 2021-11-29 ENCOUNTER — Encounter: Payer: Self-pay | Admitting: Thoracic Surgery (Cardiothoracic Vascular Surgery)

## 2021-11-30 NOTE — Progress Notes (Unsigned)
Subjective:   [name] is a 86 y.o. female who presents for Medicare Annual (Subsequent) preventive examination. I connected with  [name] on 11/30/21 by a audio enabled telemedicine application and verified that I am speaking with the correct person using two identifiers.  Patient Location: Home  Provider Location: Home Office  I discussed the limitations of evaluation and management by telemedicine. The patient expressed understanding and agreed to proceed.  Review of Systems    Deferred to PCP       Objective:    There were no vitals filed for this visit. There is no height or weight on file to calculate BMI.     11/11/2020   10:08 AM 06/01/2015   10:42 AM 07/03/2014   11:28 AM 01/07/2014   12:11 PM 11/24/2013   12:40 PM 05/06/2013    6:45 AM 04/07/2013    2:00 PM  Advanced Directives  Does Patient Have a Medical Advance Directive? No No No No No Patient does not have advance directive Patient does not have advance directive;Patient would not like information  Would patient like information on creating a medical advance directive? No - Patient declined No - patient declined information No - patient declined information Yes - Educational materials given No - patient declined information      Current Medications (verified) Outpatient Encounter Medications as of 12/01/2021  Medication Sig   acetaminophen (TYLENOL) 500 MG tablet Take 1 tablet (500 mg total) by mouth every 6 (six) hours as needed.   atorvastatin (LIPITOR) 10 MG tablet TAKE 1 TABLET (10 MG TOTAL) BY MOUTH DAILY AT 6 PM.   chlorthalidone (HYGROTON) 25 MG tablet TAKE 1 TABLET BY MOUTH EVERY DAY   Cholecalciferol (VITAMIN D-3) 1000 units CAPS Take 1 capsule (1,000 Units total) by mouth daily.   clopidogrel (PLAVIX) 75 MG tablet TAKE 1 TABLET BY MOUTH DAILY WITH BREAKFAST.   gabapentin (NEURONTIN) 300 MG capsule Take 300 mg by mouth daily.   magnesium oxide (MAG-OX) 400 MG tablet Take 1 tablet (400 mg  total) by mouth daily.   methocarbamol (ROBAXIN) 500 MG tablet TAKE 1 TABLET BY MOUTH AT BEDTIME AS NEEDED FOR MUSCLE SPASMS.   metoprolol succinate (TOPROL-XL) 25 MG 24 hr tablet TAKE 1 TABLET (25 MG) BY ORAL ROUTE TWICE A DAY   Multiple Vitamin (MULTIVITAMIN) tablet Take 1 tablet by mouth at bedtime.    telmisartan (MICARDIS) 40 MG tablet TAKE 1 TABLET BY MOUTH EVERY DAY   No facility-administered encounter medications on file as of 12/01/2021.    Allergies (verified) Aspirin, Lactose intolerance (gi), Adhesive [tape], Lisinopril, and Percocet [oxycodone-acetaminophen]   History: Past Medical History:  Diagnosis Date   Arthritis    Benign hypertensive kidney disease with chronic kidney disease stage I through stage IV, or unspecified(403.10)    sees dr. Hyman Hopes again in april   [facility])    lung ca dx'd 02/2013   Chronic kidney disease, stage III (moderate) (HCC)    Cough 02/26/2013   GERD (gastroesophageal reflux disease)    Headache    Hypertension    Hyperthyroidism    took iodine treatment for this   Lung mass 02/27/2013   Neurologic disorder    2009   Shortness of breath    exertion   Thrombocytopenia, unspecified (HCC) 02/26/2013   Unspecified vitamin D deficiency    Past Surgical History:  Procedure Laterality Date   BACK SURGERY     EYE SURGERY     cataract  EYE SURGERY Left    open up tear duct   LYMPH NODE DISSECTION Left 04/07/2013   Procedure: LYMPH NODE DISSECTION;  Surgeon: Delight Ovens, MD;  Location: [facility] OR;  Service: Thoracic;  Laterality: Left;   NECK SURGERY     PORT-A-CATH REMOVAL Left 01/09/2014   Procedure: REMOVAL PORT-A-CATH;  Surgeon: Delight Ovens, MD;  Location: [facility] OR;  Service: Thoracic;  Laterality: Left;   PORTACATH PLACEMENT Left 05/06/2013   Procedure: INSERTION PORT-A-CATH;  Surgeon: Delight Ovens, MD;  Location: [facility] OR;  Service: Thoracic;  Laterality: Left;   VIDEO ASSISTED THORACOSCOPY (VATS)/ LOBECTOMY Left 04/07/2013    Procedure: VIDEO ASSISTED THORACOSCOPY (VATS)/ LOBECTOMY; INSERTION OF ON-Q PUMP;  Surgeon: Delight Ovens, MD;  Location: [facility] OR;  Service: Thoracic;  Laterality: Left;   VIDEO BRONCHOSCOPY N/A 04/07/2013   Procedure: VIDEO BRONCHOSCOPY;  Surgeon: Delight Ovens, MD;  Location: [facility] OR;  Service: Thoracic;  Laterality: N/A;   Family History  Problem Relation Age of Onset   Lung cancer Brother        smoked   Breast cancer Mother    Lung cancer Father        smoked   Rectal cancer Neg Hx    Stomach cancer Neg Hx    Esophageal cancer Neg Hx    Colon cancer Neg Hx    Social History   Socioeconomic History   Marital status: Single    Spouse name: Not on file   Number of children: 2   Years of education: Not on file   Highest education level: Not on file  Occupational History   Not on file  Tobacco Use   Smoking status: Former    Packs/day: 0.50    Years: 60.00    Total pack years: 30.00    Types: Cigarettes    Quit date: 02/25/2012    Years since quitting: 9.7   Smokeless tobacco: Never  Substance and Sexual Activity   Alcohol use: No   Drug use: No   Sexual activity: Not on file  Other Topics Concern   Not on file  Social History Narrative   Not on file   Social Determinants of Health   Financial Resource Strain: Low Risk  (11/11/2020)   Overall Financial Resource Strain (CARDIA)    Difficulty of Paying Living Expenses: Not hard at all  Food Insecurity: No Food Insecurity (11/11/2020)   Hunger Vital Sign    Worried About Running Out of Food in the Last Year: Never true    Ran Out of Food in the Last Year: Never true  Transportation Needs: No Transportation Needs (11/11/2020)   PRAPARE - Administrator, Civil Service (Medical): No    Lack of Transportation (Non-Medical): No  Physical Activity: Sufficiently Active (11/11/2020)   Exercise Vital Sign    Days of Exercise per Week: 5 days    Minutes of Exercise per Session: 30 min  Stress: No Stress  Concern Present (11/11/2020)   [employer] of Occupational Health - Occupational Stress Questionnaire    Feeling of Stress : Not at all  Social Connections: Moderately Integrated (11/11/2020)   Social Connection and Isolation Panel [NHANES]    Frequency of Communication with Friends and Family: More than three times a week    Frequency of Social Gatherings with Friends and Family: More than three times a week    Attends Religious Services: More than 4 times per year    Active Member of  Clubs or Organizations: Yes    Attends Engineer, structural: More than 4 times per year    Marital Status: Never married    Tobacco Counseling Counseling given: Not Answered   Clinical Intake:                 Diabetic?No         Activities of Daily Living     No data to display           Patient Care Team: Georgina Quint, MD as PCP - General (Internal Medicine) Artis Delay, MD as Consulting Physician (Hematology and Oncology) Delight Ovens, MD (Inactive) as Consulting Physician (Cardiothoracic Surgery) Gelene Mink, OD as Referring Physician (Optometry)  Indicate any recent Medical Services you may have received from other than [facility] providers in the past year (date may be approximate).     Assessment:   This is a routine wellness examination for Arie.  Hearing/Vision screen No results found.  Dietary issues and exercise activities discussed:     Goals Addressed   None   Depression Screen    05/23/2021    1:09 PM 11/23/2020    1:10 PM 11/11/2020   10:15 AM 06/14/2020    1:36 PM 06/17/2019    2:56 PM 07/30/2018   11:07 AM 11/01/2017   11:14 AM  PHQ 2/9 Scores  PHQ - 2 Score 0 0 0 0 0 0 0    Fall Risk    05/23/2021    1:09 PM 11/23/2020    1:10 PM 11/11/2020   10:10 AM 12/16/2019    1:26 PM 06/17/2019    2:56 PM  Fall Risk   Falls in the past year? 0 0 0 0 0  Number falls in past yr:  0 0 0   Injury with Fall?  0 0 0   Risk for  fall due to :   No Fall Risks    Follow up   Falls evaluation completed  Falls evaluation completed    FALL RISK PREVENTION PERTAINING TO THE HOME:  Any stairs in or around the home? {YES/NO:21197} If so, are there any without handrails? {YES/NO:21197} Home free of loose throw rugs in walkways, pet beds, electrical cords, etc? {YES/NO:21197} Adequate lighting in your home to reduce risk of falls? {YES/NO:21197}  ASSISTIVE DEVICES UTILIZED TO PREVENT FALLS:  Life alert? {YES/NO:21197} Use of a cane, walker or w/c? {YES/NO:21197} Grab bars in the bathroom? {YES/NO:21197} Shower chair or bench in shower? {YES/NO:21197} Elevated toilet seat or a handicapped toilet? {YES/NO:21197}  Cognitive Function:        Immunizations Immunization History  Administered Date(s) Administered   Fluad Quad(high Dose 65+) 11/23/2020   Influenza Split 10/23/2012, 10/22/2013, 10/12/2015   Influenza, High Dose Seasonal PF 11/01/2017   Pneumococcal Conjugate-13 11/26/2018, 06/17/2019   Pneumococcal Polysaccharide-23 04/11/2013   Zoster Recombinat (Shingrix) 12/17/2020    TDAP status: Due, Education has been provided regarding the importance of this vaccine. Advised may receive this vaccine at local pharmacy or Health Dept. Aware to provide a copy of the vaccination record if obtained from local pharmacy or Health Dept. Verbalized acceptance and understanding.  Flu Vaccine status: Due, Education has been provided regarding the importance of this vaccine. Advised may receive this vaccine at local pharmacy or Health Dept. Aware to provide a copy of the vaccination record if obtained from local pharmacy or Health Dept. Verbalized acceptance and understanding.  Pneumococcal vaccine status: Up to date  Covid-19 vaccine status: Information  provided on how to obtain vaccines.   Qualifies for Shingles Vaccine? Yes   Zostavax completed No   Shingrix Completed?: No.    Education has been provided regarding  the importance of this vaccine. Patient has been advised to call insurance company to determine out of pocket expense if they have not yet received this vaccine. Advised may also receive vaccine at local pharmacy or Health Dept. Verbalized acceptance and understanding.  Screening Tests Health Maintenance  Topic Date Due   COVID-19 Vaccine (1) Never done   TETANUS/TDAP  Never done   DEXA SCAN  Never done   Zoster Vaccines- Shingrix (2 of 2) 02/11/2021   Medicare Annual Wellness (AWV)  12/02/2022   Pneumonia Vaccine 62+ Years old  Completed   HPV VACCINES  Aged Out    Health Maintenance  Health Maintenance Due  Topic Date Due   COVID-19 Vaccine (1) Never done   TETANUS/TDAP  Never done   DEXA SCAN  Never done   Zoster Vaccines- Shingrix (2 of 2) 02/11/2021    Colorectal cancer screening: No longer required.   Mammogram status: Completed 03/01/21. Repeat every year  {Bone Density status:21018021}  Lung Cancer Screening: (Low Dose CT Chest recommended if Age 30-80 years, 30 pack-year currently smoking OR have quit w/in 15years.) does not qualify.   Additional Screening:  Hepatitis C Screening: does qualify; Completed provided education  Vision Screening: Recommended annual ophthalmology exams for early detection of glaucoma and other disorders of the eye. Is the patient up to date with their annual eye exam?  {YES/NO:21197} Who is the provider or what is the name of the office in which the patient attends annual eye exams? *** If pt is not established with a provider, would they like to be referred to a provider to establish care? {YES/NO:21197}.   Dental Screening: Recommended annual dental exams for proper oral hygiene  Community Resource Referral / Chronic Care Management: CRR required this visit?  {YES/NO:21197}  CCM required this visit?  {YES/NO:21197}     Plan:     I have personally reviewed and noted the following in the patient's chart:   Medical and social  history Use of alcohol, tobacco or illicit drugs  Current medications and supplements including opioid prescriptions. Patient is not currently taking opioid prescriptions. Functional ability and status Nutritional status Physical activity Advanced directives List of other physicians Hospitalizations, surgeries, and ER visits in previous 12 months Vitals Screenings to include cognitive, depression, and falls Referrals and appointments  In addition, I have reviewed and discussed with patient certain preventive protocols, quality metrics, and best practice recommendations. A written personalized care plan for preventive services as well as general preventive health recommendations were provided to patient.     Wanda Plump, RN   11/30/2021   Nurse Notes:  Ms. Jakubczak , Thank you for taking time to come for your Medicare Wellness Visit. I appreciate your ongoing commitment to your health goals. Please review the following plan we discussed and let me know if I can assist you in the future.   These are the goals we discussed:  Goals       Patient Stated     Patient Stated (pt-stated)      Continue to thank God for all of his many blessings.        This is a list of the screening recommended for you and due dates:  Health Maintenance  Topic Date Due   COVID-19 Vaccine (1) Never done  Tetanus Vaccine  Never done   DEXA scan (bone density measurement)  Never done   Zoster (Shingles) Vaccine (2 of 2) 02/11/2021   Medicare Annual Wellness Visit  12/02/2022   Pneumonia Vaccine  Completed   HPV Vaccine  Aged Out

## 2021-11-30 NOTE — Patient Instructions (Signed)

## 2021-12-01 ENCOUNTER — Ambulatory Visit: Payer: Medicare Other | Admitting: *Deleted

## 2021-12-01 DIAGNOSIS — Z0000 Encounter for general adult medical examination without abnormal findings: Secondary | ICD-10-CM

## 2022-01-06 ENCOUNTER — Other Ambulatory Visit: Payer: Self-pay | Admitting: Emergency Medicine

## 2022-01-06 DIAGNOSIS — M62838 Other muscle spasm: Secondary | ICD-10-CM

## 2022-03-02 ENCOUNTER — Ambulatory Visit (INDEPENDENT_AMBULATORY_CARE_PROVIDER_SITE_OTHER): Payer: 59 | Admitting: Emergency Medicine

## 2022-03-02 ENCOUNTER — Encounter: Payer: Self-pay | Admitting: Emergency Medicine

## 2022-03-02 VITALS — BP 128/72 | HR 53 | Temp 97.9°F | Ht 61.0 in | Wt 148.0 lb

## 2022-03-02 DIAGNOSIS — H6121 Impacted cerumen, right ear: Secondary | ICD-10-CM | POA: Diagnosis not present

## 2022-03-02 NOTE — Progress Notes (Signed)
PRE-PROCEDURE EXAM: Right TM cannot be visualized due to total occlusion/impaction of the ear canal.  PROCEDURE INDICATION: remove wax to visualize ear drum & relieve discomfort  CONSENT:  Verbal     PROCEDURE NOTE:     Right EAR:  I used warm water irrigation under direct visualization with the otoscope to free the wax bolus from the ear canal.    POST- PROCEDURE EXAM:  Right TM successfully visualized and found to have no erythema     The patient tolerated the procedure well.

## 2022-03-02 NOTE — Progress Notes (Signed)
[name] 87 y.o.   Chief Complaint  Patient presents with   Follow-up    Ears clogged, right knee pain     HISTORY OF PRESENT ILLNESS: Acute problem visit today. This is a 87 y.o. female complaining of clogged right ear. Also has some chronic right knee pain No other complaints or medical concerns today.  HPI   Prior to Admission medications   Medication Sig Start Date End Date Taking? Authorizing Provider  acetaminophen (TYLENOL) 500 MG tablet Take 1 tablet (500 mg total) by mouth every 6 (six) hours as needed. 01/15/17  Yes Augusto Gamble B, NP  atorvastatin (LIPITOR) 10 MG tablet TAKE 1 TABLET (10 MG TOTAL) BY MOUTH DAILY AT 6 PM. 06/22/21  Yes Yadriel Kerrigan, Ines Bloomer, MD  chlorthalidone (HYGROTON) 25 MG tablet TAKE 1 TABLET BY MOUTH EVERY DAY 07/17/21  Yes Keari Miu, Ines Bloomer, MD  Cholecalciferol (VITAMIN D-3) 1000 units CAPS Take 1 capsule (1,000 Units total) by mouth daily. 08/02/17  Yes Delia Chimes A, MD  clopidogrel (PLAVIX) 75 MG tablet TAKE 1 TABLET BY MOUTH DAILY WITH BREAKFAST. 05/16/21  Yes Shaely Gadberry, Ines Bloomer, MD  gabapentin (NEURONTIN) 300 MG capsule Take 300 mg by mouth daily.   Yes [provider]  magnesium oxide (MAG-OX) 400 MG tablet Take 1 tablet (400 mg total) by mouth daily. 08/02/17  Yes Stallings, Zoe A, MD  methocarbamol (ROBAXIN) 500 MG tablet TAKE 1 TABLET BY MOUTH AT BEDTIME AS NEEDED FOR MUSCLE SPASMS. 01/06/22  Yes Tharon Kitch, Ines Bloomer, MD  metoprolol succinate (TOPROL-XL) 25 MG 24 hr tablet TAKE 1 TABLET (25 MG) BY ORAL ROUTE TWICE A DAY 08/25/21  Yes Cniyah Sproull, Ines Bloomer, MD  Multiple Vitamin (MULTIVITAMIN) tablet Take 1 tablet by mouth at bedtime.    Yes [provider]  telmisartan (MICARDIS) 40 MG tablet TAKE 1 TABLET BY MOUTH EVERY DAY 07/17/21  Yes Alferd Obryant, Ines Bloomer, MD    Allergies  Allergen Reactions   Aspirin Other (See Comments)    REACTION: stomach upset   Lactose Intolerance (Gi) Diarrhea and Other (See  Comments)    "can't drink milk. It tears my stomach up."   Adhesive [Tape] Rash   Lisinopril Other (See Comments)    cough   Percocet [Oxycodone-Acetaminophen] Itching    Patient Active Problem List   Diagnosis Date Noted   Atherosclerosis of aorta ([place]) 06/14/2020   Pulmonary emphysema ([place]) 06/14/2020   Atherosclerosis of native coronary artery of native heart without angina pectoris 06/14/2020   Malignant neoplasm of unspecified part of unspecified bronchus or lung ([place]) 12/16/2019   Sacroiliac joint disease 08/02/2017   History of TIA (transient ischemic attack) 08/02/2017   Vitamin D deficiency 08/02/2017   Arthritis 08/02/2017   Long term current use of anticoagulant therapy 08/02/2017   Left thyroid nodule 01/01/2015   Benign hypertension with CKD (chronic kidney disease) stage IV ([place]) 11/06/2013   Anemia in chronic kidney disease 05/28/2013   History of lung cancer 04/07/2013   Essential hypertension, benign 03/05/2013   Personal history of colonic polyps 10/29/2012    Past Medical History:  Diagnosis Date   Arthritis    Benign hypertensive kidney disease with chronic kidney disease stage I through stage IV, or unspecified(403.10)    sees dr. Justin Mend again in april   Cancer [facility])    lung ca dx'd 02/2013   Chronic kidney disease, stage III (moderate) (HCC)    Cough 02/26/2013   GERD (gastroesophageal reflux disease)    Headache  Hypertension    Hyperthyroidism    took iodine treatment for this   Lung mass 02/27/2013   Neurologic disorder    2009   Shortness of breath    exertion   Thrombocytopenia, unspecified ([place]) 02/26/2013   Unspecified vitamin D deficiency     Past Surgical History:  Procedure Laterality Date   BACK SURGERY     EYE SURGERY     cataract   EYE SURGERY Left    open up tear duct   LYMPH NODE DISSECTION Left 04/07/2013   Procedure: LYMPH NODE DISSECTION;  Surgeon: [name], MD;  Location[facility];  Service: Thoracic;  Laterality: Left;    NECK SURGERY     PORT-A-CATH REMOVAL Left 01/09/2014   Procedure: REMOVAL PORT-A-CATH;  Surgeon: [name], MD;  Location: [place];  Service: Thoracic;  Laterality: Left;   PORTACATH PLACEMENT Left 05/06/2013   Procedure: INSERTION PORT-A-CATH;  Surgeon: [name], MD;  Location: [place];  Service: Thoracic;  Laterality: Left;   VIDEO ASSISTED THORACOSCOPY (VATS)/ LOBECTOMY Left 04/07/2013   Procedure: VIDEO ASSISTED THORACOSCOPY (VATS)/ LOBECTOMY; INSERTION OF ON-Q PUMP;  Surgeon: [name], MD;  Location: [place];  Service: Thoracic;  Laterality: Left;   VIDEO BRONCHOSCOPY N/A 04/07/2013   Procedure: VIDEO BRONCHOSCOPY;  Surgeon: [name], MD;  Location: [facility] OR;  Service: Thoracic;  Laterality: N/A;    Social History   Socioeconomic History   Marital status: Single    Spouse name: Not on file   Number of children: 2   Years of education: Not on file   Highest education level: Not on file  Occupational History   Not on file  Tobacco Use   Smoking status: Former    Packs/day: 0.50    Years: 60.00    Total pack years: 30.00    Types: Cigarettes    Quit date: 02/25/2012    Years since quitting: 10.0   Smokeless tobacco: Never  Substance and Sexual Activity   Alcohol use: No   Drug use: No   Sexual activity: Not on file  Other Topics Concern   Not on file  Social History Narrative   Not on file   Social Determinants of Health   Financial Resource Strain: Low Risk  (11/11/2020)   Overall Financial Resource Strain (CARDIA)    Difficulty of Paying Living Expenses: Not hard at all  Food Insecurity: No Food Insecurity (11/11/2020)   Hunger Vital Sign    Worried About Running Out of Food in the Last Year: Never true    Ran Out of Food in the Last Year: Never true  Transportation Needs: No Transportation Needs (11/11/2020)   PRAPARE - Hydrologist (Medical): No    Lack of Transportation (Non-Medical): No  Physical Activity:  Sufficiently Active (11/11/2020)   Exercise Vital Sign    Days of Exercise per Week: 5 days    Minutes of Exercise per Session: 30 min  Stress: No Stress Concern Present (11/11/2020)   [place]    Feeling of Stress : Not at all  Social Connections: Moderately Integrated (11/11/2020)   Social Connection and Isolation Panel [NHANES]    Frequency of Communication with Friends and Family: More than three times a week    Frequency of Social Gatherings with Friends and Family: More than three times a week    Attends Religious Services: More than 4 times per year  Active Member of Clubs or Organizations: Yes    Attends Archivist Meetings: More than 4 times per year    Marital Status: Never married  Intimate Partner Violence: Not At Risk (11/11/2020)   Humiliation, Afraid, Rape, and Kick questionnaire    Fear of Current or Ex-Partner: No    Emotionally Abused: No    Physically Abused: No    Sexually Abused: No    Family History  Problem Relation Age of Onset   Lung cancer Brother        smoked   Breast cancer Mother    Lung cancer Father        smoked   Rectal cancer Neg Hx    Stomach cancer Neg Hx    Esophageal cancer Neg Hx    Colon cancer Neg Hx      Review of Systems  Constitutional: Negative.  Negative for fever.  HENT:  Positive for hearing loss. Negative for congestion and sore throat.   Respiratory: Negative.  Negative for cough and shortness of breath.   Cardiovascular: Negative.  Negative for chest pain and palpitations.  Gastrointestinal:  Negative for abdominal pain, nausea and vomiting.  Neurological: Negative.  Negative for dizziness and headaches.  All other systems reviewed and are negative.  Today's Vitals   03/02/22 1413  BP: 128/72  Pulse: (!) 53  Temp: 97.9 F (36.6 C)  TempSrc: Oral  SpO2: 95%  Weight: 148 lb (67.1 kg)  Height: 5\' 1"  (1.549 m)   Body mass index is  27.96 kg/m.   Physical Exam Vitals reviewed.  Constitutional:      Appearance: Normal appearance.  HENT:     Right Ear: There is impacted cerumen.     Left Ear: Tympanic membrane, ear canal and external ear normal.  Eyes:     Extraocular Movements: Extraocular movements intact.  Cardiovascular:     Rate and Rhythm: Normal rate.  Pulmonary:     Effort: Pulmonary effort is normal.  Skin:    General: Skin is warm and dry.  Neurological:     Mental Status: She is alert and oriented to person, place, and time.  Psychiatric:        Mood and Affect: Mood normal.        Behavior: Behavior normal.    Ceruminosis is noted.  After obtaining verbal concern from patient wax is removed by syringing and manual debridement.  Tolerated procedure well.  No complications.  Instructions for home care to prevent wax buildup are given.   ASSESSMENT & PLAN: Problem List Items Addressed This Visit       Nervous and Auditory   Hearing loss of right ear due to cerumen impaction - Primary    Successful irrigation with symptom improvement Patient wears hearing aids bilaterally. Earwax buildup prevention instructions given.      Other Visit Diagnoses     Impacted cerumen of right ear       Relevant Orders   Ear Lavage      Patient Instructions  Earwax Buildup, Adult The ears produce a substance called earwax that helps keep bacteria out of the ear and protects the skin in the ear canal. Occasionally, earwax can build up in the ear and cause discomfort or hearing loss. What are the causes? This condition is caused by a buildup of earwax. Ear canals are self-cleaning. Ear wax is made in the outer part of the ear canal and generally falls out in small amounts over time. When  the self-cleaning mechanism is not working, earwax builds up and can cause decreased hearing and discomfort. Attempting to clean ears with cotton swabs can push the earwax deep into the ear canal and cause decreased hearing  and pain. What increases the risk? This condition is more likely to develop in people who: Clean their ears often with cotton swabs. Pick at their ears. Use earplugs or in-ear headphones often, or wear hearing aids. The following factors may also make you more likely to develop this condition: Being female. Being of older age. Naturally producing more earwax. Having narrow ear canals. Having earwax that is overly thick or sticky. Having excess hair in the ear canal. Having eczema. Being dehydrated. What are the signs or symptoms? Symptoms of this condition include: Reduced or muffled hearing. A feeling of fullness in the ear or feeling that the ear is plugged. Fluid coming from the ear. Ear pain or an itchy ear. Ringing in the ear. Coughing. Balance problems. An obvious piece of earwax that can be seen inside the ear canal. How is this diagnosed? This condition may be diagnosed based on: Your symptoms. Your medical history. An ear exam. During the exam, your health care provider will look into your ear with an instrument called an otoscope. You may have tests, including a hearing test. How is this treated? This condition may be treated by: Using ear drops to soften the earwax. Having the earwax removed by a health care provider. The health care provider may: Flush the ear with water. Use an instrument that has a loop on the end (curette). Use a suction device. Having surgery to remove the wax buildup. This may be done in severe cases. Follow these instructions at home:  Take over-the-counter and prescription medicines only as told by your health care provider. Do not put any objects, including cotton swabs, into your ear. You can clean the opening of your ear canal with a washcloth or facial tissue. Follow instructions from your health care provider about cleaning your ears. Do not overclean your ears. Drink enough fluid to keep your urine pale yellow. This will help to thin  the earwax. Keep all follow-up visits as told. If earwax builds up in your ears often or if you use hearing aids, consider seeing your health care provider for routine, preventive ear cleanings. Ask your health care provider how often you should schedule your cleanings. If you have hearing aids, clean them according to instructions from the manufacturer and your health care provider. Contact a health care provider if: You have ear pain. You develop a fever. You have pus or other fluid coming from your ear. You have hearing loss. You have ringing in your ears that does not go away. You feel like the room is spinning (vertigo). Your symptoms do not improve with treatment. Get help right away if: You have bleeding from the affected ear. You have severe ear pain. Summary Earwax can build up in the ear and cause discomfort or hearing loss. The most common symptoms of this condition include reduced or muffled hearing, a feeling of fullness in the ear, or feeling that the ear is plugged. This condition may be diagnosed based on your symptoms, your medical history, and an ear exam. This condition may be treated by using ear drops to soften the earwax or by having the earwax removed by a health care provider. Do not put any objects, including cotton swabs, into your ear. You can clean the opening of your ear canal  with a washcloth or facial tissue. This information is not intended to replace advice given to you by your health care provider. Make sure you discuss any questions you have with your health care provider. Document Revised: 04/29/2019 Document Reviewed: 04/29/2019 Elsevier Patient Education  [place], MD [facility]

## 2022-03-02 NOTE — Patient Instructions (Signed)

## 2022-03-02 NOTE — Assessment & Plan Note (Signed)
Successful irrigation with symptom improvement Patient wears hearing aids bilaterally. Earwax buildup prevention instructions given.

## 2022-03-08 ENCOUNTER — Telehealth: Payer: Self-pay

## 2022-03-08 NOTE — Telephone Encounter (Signed)
Left message for patient to call back to schedule Medicare Annual Wellness Visit   Last AWV  11/11/20  Please schedule at anytime with [facility] if patient calls the office back.    30 Minutes appointment   Any questions, please call me at [phone]

## 2022-04-05 ENCOUNTER — Other Ambulatory Visit: Payer: Self-pay | Admitting: Emergency Medicine

## 2022-04-05 DIAGNOSIS — R413 Other amnesia: Secondary | ICD-10-CM

## 2022-04-05 NOTE — Progress Notes (Signed)
Patient seen yesterday during daughter's follow-up office visit. Both concerned over cognitive decline in particular memory issues. Recommend neurology evaluation. Referral placed today.

## 2022-04-12 ENCOUNTER — Encounter: Payer: Self-pay | Admitting: Physician Assistant

## 2022-04-12 ENCOUNTER — Ambulatory Visit: Payer: 59

## 2022-04-12 ENCOUNTER — Ambulatory Visit (INDEPENDENT_AMBULATORY_CARE_PROVIDER_SITE_OTHER): Payer: 59 | Admitting: Physician Assistant

## 2022-04-12 ENCOUNTER — Other Ambulatory Visit (INDEPENDENT_AMBULATORY_CARE_PROVIDER_SITE_OTHER): Payer: 59

## 2022-04-12 VITALS — BP 128/69 | HR 63 | Resp 20 | Ht 60.0 in | Wt 138.0 lb

## 2022-04-12 DIAGNOSIS — R413 Other amnesia: Secondary | ICD-10-CM

## 2022-04-12 LAB — TSH: TSH: 2 u[IU]/mL (ref 0.35–5.50)

## 2022-04-12 LAB — VITAMIN B12: Vitamin B-12: 1191 pg/mL — ABNORMAL HIGH (ref 211–911)
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
## 2022-04-12 NOTE — Patient Instructions (Addendum)
It was a pleasure to see you today at our office.   Recommendations:   MRI of the brain, the radiology office will call you to arrange you appointment If you have any severe symptoms of a stroke, or other severe issues such as confusion,severe chills or fever, etc call 911 or go to the ER as you may need to be evaluated further Check labs today Follow up in May  For psychiatric meds, mood meds: Please have your primary care physician manage these medications.   Feel free to visit Facebook page " [email or web address]" for tips of how to care for people with memory problems.  Make sure you have the ears checked for good hearing Consider [facility]  [place], [place] [address] [phone]  Hours of Operation Mondays to Thursdays: 8 am to 8 pm,Fridays: 9 am to 8 pm, Saturdays: 9 am to 1 pm Sundays: Closed  [email or web address]       RECOMMENDATIONS FOR ALL PATIENTS WITH MEMORY PROBLEMS: 1. Continue to exercise (Recommend 30 minutes of walking everyday, or 3 hours every week) 2. Increase social interactions - continue going to [place] and enjoy social gatherings with friends and family 3. Eat healthy, avoid fried foods and eat more fruits and vegetables 4. Maintain adequate blood pressure, blood sugar, and blood cholesterol level. Reducing the risk of stroke and cardiovascular disease also helps promoting better memory. 5. Avoid stressful situations. Live a simple life and avoid aggravations. Organize your time and prepare for the next day in anticipation. 6. Sleep well, avoid any interruptions of sleep and avoid any distractions in the bedroom that may interfere with adequate sleep quality 7. Avoid sugar, avoid sweets as there is a strong link between excessive sugar intake, diabetes, and cognitive impairment We discussed the Mediterranean diet, which has been shown to help patients reduce the risk  of progressive memory disorders and reduces cardiovascular risk. This includes eating fish, eat fruits and green leafy vegetables, nuts like almonds and hazelnuts, walnuts, and also use olive oil. Avoid fast foods and fried foods as much as possible. Avoid sweets and sugar as sugar use has been linked to worsening of memory function.  There is always a concern of gradual progression of memory problems. If this is the case, then we may need to adjust level of care according to patient needs. Support, both to the patient and caregiver, should then be put into place.         FALL PRECAUTIONS: Be cautious when walking. Scan the area for obstacles that may increase the risk of trips and falls. When getting up in the mornings, sit up at the edge of the bed for a few minutes before getting out of bed. Consider elevating the bed at the head end to avoid drop of blood pressure when getting up. Walk always in a well-lit room (use night lights in the walls). Avoid area rugs or power cords from appliances in the middle of the walkways. Use a walker or a cane if necessary and consider physical therapy for balance exercise. Get your eyesight checked regularly.  FINANCIAL OVERSIGHT: Supervision, especially oversight when making financial decisions or transactions is also recommended.  HOME SAFETY: Consider the safety of the kitchen when operating appliances like stoves, microwave oven, and blender. Consider having supervision and share cooking responsibilities until no longer able to participate in those. Accidents with firearms and other hazards in the house should be identified and addressed as well.   ABILITY TO BE LEFT  ALONE: If patient is unable to contact 911 operator, consider using LifeLine, or when the need is there, arrange for someone to stay with patients. Smoking is a fire hazard, consider supervision or cessation. Risk of wandering should be assessed by caregiver and if detected at any point,  supervision and safe proof recommendations should be instituted.  MEDICATION SUPERVISION: Inability to self-administer medication needs to be constantly addressed. Implement a mechanism to ensure safe administration of the medications.   DRIVING: Regarding driving, in patients with progressive memory problems, driving will be impaired. We advise to have someone else do the driving if trouble finding directions or if minor accidents are reported. Independent driving assessment is available to determine safety of driving.   If you are interested in the driving assessment, you can contact the following:  [employer] in [place]  [place] [phone] or [phone]    [place] refers to food and lifestyle choices that are based on the traditions of countries located on the [employer]. This way of eating has been shown to help prevent certain conditions and improve outcomes for people who have chronic diseases, like kidney disease and heart disease. What are tips for following this plan? Lifestyle  Cook and eat meals together with your family, when possible. Drink enough fluid to keep your urine clear or pale yellow. Be physically active every day. This includes: Aerobic exercise like running or swimming. Leisure activities like gardening, walking, or housework. Get 7-8 hours of sleep each night. If recommended by your health care provider, drink red wine in moderation. This means 1 glass a day for nonpregnant women and 2 glasses a day for men. A glass of wine equals 5 oz (150 mL). Reading food labels  Check the serving size of packaged foods. For foods such as rice and pasta, the serving size refers to the amount of cooked product, not dry. Check the total fat in packaged foods. Avoid foods that have saturated fat or trans  fats. Check the ingredients list for added sugars, such as corn syrup. Shopping  At the grocery store, buy most of your food from the areas near the walls of the store. This includes: Fresh fruits and vegetables (produce). Grains, beans, nuts, and seeds. Some of these may be available in unpackaged forms or large amounts (in bulk). Fresh seafood. Poultry and eggs. Low-fat dairy products. Buy whole ingredients instead of prepackaged foods. Buy fresh fruits and vegetables in-season from local farmers markets. Buy frozen fruits and vegetables in resealable bags. If you do not have access to quality fresh seafood, buy precooked frozen shrimp or canned fish, such as tuna, salmon, or sardines. Buy small amounts of raw or cooked vegetables, salads, or olives from the deli or salad bar at your store. Stock your pantry so you always have certain foods on hand, such as olive oil, canned tuna, canned tomatoes, rice, pasta, and beans. Cooking  Cook foods with extra-virgin olive oil instead of using butter or other vegetable oils. Have meat as a side dish, and have vegetables or grains as your main dish. This means having meat in small portions or adding small amounts of meat to foods like pasta or stew. Use beans or vegetables instead of meat in common dishes like chili or lasagna. Experiment with different cooking methods. Try roasting or broiling vegetables instead of steaming or sauteing them. Add frozen vegetables to soups, stews, pasta,  or rice. Add nuts or seeds for added healthy fat at each meal. You can add these to yogurt, salads, or vegetable dishes. Marinate fish or vegetables using olive oil, lemon juice, garlic, and fresh herbs. Meal planning  Plan to eat 1 vegetarian meal one day each week. Try to work up to 2 vegetarian meals, if possible. Eat seafood 2 or more times a week. Have healthy snacks readily available, such as: Vegetable sticks with hummus. Greek yogurt. Fruit and nut  trail mix. Eat balanced meals throughout the week. This includes: Fruit: 2-3 servings a day Vegetables: 4-5 servings a day Low-fat dairy: 2 servings a day Fish, poultry, or lean meat: 1 serving a day Beans and legumes: 2 or more servings a week Nuts and seeds: 1-2 servings a day Whole grains: 6-8 servings a day Extra-virgin olive oil: 3-4 servings a day Limit red meat and sweets to only a few servings a month What are my food choices? Mediterranean diet Recommended Grains: Whole-grain pasta. Brown rice. Bulgar wheat. Polenta. Couscous. Whole-wheat bread. [place]. Vegetables: Artichokes. Beets. Broccoli. Cabbage. Carrots. Eggplant. Green beans. Chard. Kale. Spinach. Onions. Leeks. Peas. Squash. Tomatoes. Peppers. Radishes. Fruits: Apples. Apricots. Avocado. Berries. Bananas. Cherries. Dates. Figs. Grapes. Lemons. Melon. Oranges. Peaches. Plums. Pomegranate. Meats and other protein foods: Beans. Almonds. Sunflower seeds. Pine nuts. Peanuts. [place]. Salmon. Scallops. Shrimp. [place]. Tilapia. Clams. Oysters. Eggs. Dairy: Low-fat milk. Cheese. Greek yogurt. Beverages: Water. Red wine. Herbal tea. Fats and oils: Extra virgin olive oil. Avocado oil. Grape seed oil. Sweets and desserts: [place] yogurt with honey. Baked apples. Poached pears. Trail mix. Seasoning and other foods: Basil. Cilantro. Coriander. Cumin. Mint. Parsley. Sage. Rosemary. Tarragon. Garlic. Oregano. Thyme. Pepper. Balsalmic vinegar. Tahini. Hummus. Tomato sauce. Olives. Mushrooms. Limit these Grains: Prepackaged pasta or rice dishes. Prepackaged cereal with added sugar. Vegetables: Deep fried potatoes (french fries). Fruits: Fruit canned in syrup. Meats and other protein foods: Beef. Pork. Lamb. Poultry with skin. Hot dogs. [place] Salines. Dairy: Ice cream. Sour cream. Whole milk. Beverages: Juice. Sugar-sweetened soft drinks. Beer. Liquor and spirits. Fats and oils: Butter. Canola oil. Vegetable oil. Beef fat (tallow).  Lard. Sweets and desserts: Cookies. Cakes. Pies. Candy. Seasoning and other foods: Mayonnaise. Premade sauces and marinades. The items listed may not be a complete list. Talk with your dietitian about what dietary choices are right for you. Summary The Mediterranean diet includes both food and lifestyle choices. Eat a variety of fresh fruits and vegetables, beans, nuts, seeds, and whole grains. Limit the amount of red meat and sweets that you eat. Talk with your health care provider about whether it is safe for you to drink red wine in moderation. This means 1 glass a day for nonpregnant women and 2 glasses a day for men. A glass of wine equals 5 oz (150 mL). This information is not intended to replace advice given to you by your health care provider. Make sure you discuss any questions you have with your health care provider. Document Released: 09/02/2015 Document Revised: 10/05/2015 Document Reviewed: 09/02/2015 Elsevier Interactive Patient Education  [address] [place] [phone]

## 2022-04-12 NOTE — Progress Notes (Signed)
B12 and thyroid are normal, thank you

## 2022-04-13 ENCOUNTER — Other Ambulatory Visit: Payer: Self-pay | Admitting: Emergency Medicine

## 2022-04-13 DIAGNOSIS — Z1231 Encounter for screening mammogram for malignant neoplasm of breast: Secondary | ICD-10-CM

## 2022-04-17 ENCOUNTER — Other Ambulatory Visit: Payer: Self-pay | Admitting: Emergency Medicine

## 2022-04-17 DIAGNOSIS — Z8673 Personal history of transient ischemic attack (TIA), and cerebral infarction without residual deficits: Secondary | ICD-10-CM

## 2022-04-20 ENCOUNTER — Other Ambulatory Visit: Payer: Self-pay | Admitting: Emergency Medicine

## 2022-04-20 DIAGNOSIS — M62838 Other muscle spasm: Secondary | ICD-10-CM

## 2022-05-06 ENCOUNTER — Emergency Department (HOSPITAL_COMMUNITY): Payer: 59

## 2022-05-06 ENCOUNTER — Encounter (HOSPITAL_COMMUNITY): Payer: Self-pay | Admitting: Neurological Surgery

## 2022-05-06 ENCOUNTER — Other Ambulatory Visit: Payer: Self-pay

## 2022-05-06 ENCOUNTER — Ambulatory Visit
Admission: RE | Admit: 2022-05-06 | Discharge: 2022-05-06 | Disposition: A | Discharge status: Home / Self Care | Payer: 59 | Source: Ambulatory Visit | Attending: Physician Assistant | Admitting: Physician Assistant

## 2022-05-06 ENCOUNTER — Emergency Department (HOSPITAL_COMMUNITY)
Admission: EM | Admit: 2022-05-06 | Discharge: 2022-05-06 | Disposition: A | Discharge status: Home / Self Care | Payer: 59 | Attending: Emergency Medicine | Admitting: Emergency Medicine

## 2022-05-06 DIAGNOSIS — I129 Hypertensive chronic kidney disease with stage 1 through stage 4 chronic kidney disease, or unspecified chronic kidney disease: Secondary | ICD-10-CM | POA: Insufficient documentation

## 2022-05-06 DIAGNOSIS — Z87891 Personal history of nicotine dependence: Secondary | ICD-10-CM | POA: Diagnosis not present

## 2022-05-06 DIAGNOSIS — E039 Hypothyroidism, unspecified: Secondary | ICD-10-CM | POA: Diagnosis not present

## 2022-05-06 DIAGNOSIS — Z79899 Other long term (current) drug therapy: Secondary | ICD-10-CM | POA: Insufficient documentation

## 2022-05-06 DIAGNOSIS — N183 Chronic kidney disease, stage 3 unspecified: Secondary | ICD-10-CM | POA: Insufficient documentation

## 2022-05-06 DIAGNOSIS — Z7902 Long term (current) use of antithrombotics/antiplatelets: Secondary | ICD-10-CM | POA: Diagnosis not present

## 2022-05-06 DIAGNOSIS — R109 Unspecified abdominal pain: Secondary | ICD-10-CM | POA: Diagnosis not present

## 2022-05-06 DIAGNOSIS — G939 Disorder of brain, unspecified: Secondary | ICD-10-CM

## 2022-05-06 DIAGNOSIS — R900 Intracranial space-occupying lesion found on diagnostic imaging of central nervous system: Secondary | ICD-10-CM | POA: Insufficient documentation

## 2022-05-06 DIAGNOSIS — Z85118 Personal history of other malignant neoplasm of bronchus and lung: Secondary | ICD-10-CM | POA: Insufficient documentation

## 2022-05-06 LAB — CBC WITH DIFFERENTIAL/PLATELET
Abs Immature Granulocytes: 0.02 10*3/uL (ref 0.00–0.07)
Basophils Absolute: 0.1 10*3/uL (ref 0.0–0.1)
Basophils Relative: 1 %
Eosinophils Absolute: 0.3 10*3/uL (ref 0.0–0.5)
Eosinophils Relative: 4 %
HCT: 36.4 % (ref 36.0–46.0)
Hemoglobin: 11.7 g/dL — ABNORMAL LOW (ref 12.0–15.0)
Immature Granulocytes: 0 %
Lymphocytes Relative: 13 %
Lymphs Abs: 1 10*3/uL (ref 0.7–4.0)
MCH: 30.5 pg (ref 26.0–34.0)
MCHC: 32.1 g/dL (ref 30.0–36.0)
MCV: 95 fL (ref 80.0–100.0)
Monocytes Absolute: 0.9 10*3/uL (ref 0.1–1.0)
Monocytes Relative: 12 %
Neutro Abs: 5.3 10*3/uL (ref 1.7–7.7)
Neutrophils Relative %: 70 %
Platelets: 250 10*3/uL (ref 150–400)
RBC: 3.83 MIL/uL — ABNORMAL LOW (ref 3.87–5.11)
RDW: 14.5 % (ref 11.5–15.5)
WBC: 7.5 10*3/uL (ref 4.0–10.5)
nRBC: 0 % (ref 0.0–0.2)

## 2022-05-06 LAB — BASIC METABOLIC PANEL
Anion gap: 13 (ref 5–15)
BUN: 24 mg/dL — ABNORMAL HIGH (ref 8–23)
CO2: 21 mmol/L — ABNORMAL LOW (ref 22–32)
Calcium: 9.7 mg/dL (ref 8.9–10.3)
Chloride: 106 mmol/L (ref 98–111)
Creatinine, Ser: 2 mg/dL — ABNORMAL HIGH (ref 0.44–1.00)
GFR, Estimated: 24 mL/min — ABNORMAL LOW (ref >60)
Glucose, Bld: 88 mg/dL (ref 70–99)
Potassium: 3.9 mmol/L (ref 3.5–5.1)
Sodium: 140 mmol/L (ref 135–145)

## 2022-05-06 MED ORDER — SODIUM CHLORIDE 0.9 % IV BOLUS
500.0000 mL | Freq: Once | INTRAVENOUS | Status: AC
  Administered 2022-05-06: 500 mL via INTRAVENOUS

## 2022-05-06 MED ORDER — GADOBUTROL 1 MMOL/ML IV SOLN
6.2000 mL | Freq: Once | INTRAVENOUS | Status: AC | PRN
  Administered 2022-05-06: 6.2 mL via INTRAVENOUS

## 2022-05-06 MED ORDER — DEXAMETHASONE SODIUM PHOSPHATE 4 MG/ML IJ SOLN
4.0000 mg | Freq: Once | INTRAMUSCULAR | Status: AC
  Administered 2022-05-06: 4 mg via INTRAVENOUS
  Filled 2022-05-06: qty 1

## 2022-05-06 NOTE — Discharge Instructions (Signed)
Continue your current medications for now unless told otherwise by the oncologist.  Someone will call you for appointment this week but you can also call if you like on Monday and tell them you were in the ER and have a new spot on your brain that needs follow up.  A referral has been placed.   In the meantime if your daughter would like to stay with you until the oncology appointment that would be good.  If you start having vision changes, persistent vomiting, new weakness on 1 side of your body or any confusion return to the ER immediately.

## 2022-05-06 NOTE — ED Notes (Signed)
EDP at [facility]. MRI sending for pt, pending imminent MRI

## 2022-05-06 NOTE — Consult Note (Signed)
Reason for Consult: Cerebellar lesion Referring Physician: EDP  [name] is an 87 y.o. female.   HPI:  87 year old female with a history of lung cancer who has had frontal headaches behind the eyes for a few weeks "since pollen season."  She denies visual changes or numbness or tingling or weakness or change in gait.  She notes some change in balance at times.  Her primary care physician ordered an MRI of the brain without contrast that showed a cerebellar lesion and she was sent to the emergency department for further workup and management.  Past Medical History:  Diagnosis Date   Arthritis    Benign hypertensive kidney disease with chronic kidney disease stage I through stage IV, or unspecified(403.10)    sees dr. Hyman Hopes again in april   Cancer    lung ca dx'd 02/2013   Chronic kidney disease, stage III (moderate)    Cough 02/26/2013   GERD (gastroesophageal reflux disease)    Headache    Hypertension    Hyperthyroidism    took iodine treatment for this   Lung mass 02/27/2013   Neurologic disorder    2009   Shortness of breath    exertion   Thrombocytopenia, unspecified 02/26/2013   Unspecified vitamin D deficiency     Past Surgical History:  Procedure Laterality Date   BACK SURGERY     EYE SURGERY     cataract   EYE SURGERY Left    open up tear duct   LYMPH NODE DISSECTION Left 04/07/2013   Procedure: LYMPH NODE DISSECTION;  Surgeon: Delight Ovens, MD;  Location: [facility] OR;  Service: Thoracic;  Laterality: Left;   NECK SURGERY     PORT-A-CATH REMOVAL Left 01/09/2014   Procedure: REMOVAL PORT-A-CATH;  Surgeon: Delight Ovens, MD;  Location: [facility] OR;  Service: Thoracic;  Laterality: Left;   PORTACATH PLACEMENT Left 05/06/2013   Procedure: INSERTION PORT-A-CATH;  Surgeon: Delight Ovens, MD;  Location: [facility] OR;  Service: Thoracic;  Laterality: Left;   VIDEO ASSISTED THORACOSCOPY (VATS)/ LOBECTOMY Left 04/07/2013   Procedure: VIDEO ASSISTED THORACOSCOPY (VATS)/ LOBECTOMY;  INSERTION OF ON-Q PUMP;  Surgeon: Delight Ovens, MD;  Location: [facility] OR;  Service: Thoracic;  Laterality: Left;   VIDEO BRONCHOSCOPY N/A 04/07/2013   Procedure: VIDEO BRONCHOSCOPY;  Surgeon: Delight Ovens, MD;  Location: [facility] OR;  Service: Thoracic;  Laterality: N/A;    Allergies  Allergen Reactions   Aspirin Other (See Comments)    REACTION: stomach upset   Lactose Intolerance (Gi) Diarrhea and Other (See Comments)    "can't drink milk. It tears my stomach up."   Adhesive [Tape] Rash   Lisinopril Other (See Comments)    cough   Percocet [Oxycodone-Acetaminophen] Itching    Social History   Tobacco Use   Smoking status: Former    Packs/day: 0.50    Years: 60.00    Additional pack years: 0.00    Total pack years: 30.00    Types: Cigarettes    Quit date: 02/25/2012    Years since quitting: 10.2   Smokeless tobacco: Never  Substance Use Topics   Alcohol use: No    Family History  Problem Relation Age of Onset   Breast cancer Mother    Lung cancer Father        smoked   Lung cancer Brother        smoked   Rectal cancer Neg Hx    Stomach cancer Neg Hx    Esophageal  cancer Neg Hx    Colon cancer Neg Hx      Review of Systems  Positive ROS: neg  All other systems have been reviewed and were otherwise negative with the exception of those mentioned in the HPI and as above.  Objective: Vital signs in last 24 hours: Temp:  [97.3 F (36.3 C)] 97.3 F (36.3 C) (04/13 1006) Pulse Rate:  [49-60] 49 (04/13 1315) Resp:  [12-18] 18 (04/13 1315) BP: (133-156)/(53-65) 136/59 (04/13 1315) SpO2:  [100 %] 100 % (04/13 1315)  General Appearance: Alert, cooperative, no distress, appears stated age Head: Normocephalic, without obvious abnormality, atraumatic Eyes: PERRL, conjunctiva/corneas clear, EOM's intact   Throat: benign Neck: Supple, symmetrical, trachea midline Lungs: respirations unlabored Heart: Regular rate and rhythm Abdomen: Soft Extremities: Extremities normal,  atraumatic, no cyanosis or edema Pulses: 2+ and symmetric all extremities Skin: Skin color, texture, turgor normal, no rashes or lesions  NEUROLOGIC:   Mental status: A&O x4, no aphasia, good attention span, Memory and fund of knowledge appear to be appropriate for age Motor Exam - grossly normal, normal tone and bulk Sensory Exam - grossly normal Reflexes: symmetric, no pathologic reflexes, No Hoffman's, No clonus Coordination - grossly normal Gait -not tested Balance -not tested Cranial Nerves: I: smell Not tested  II: visual acuity  OS: na    OD: na  II: visual fields Full to confrontation  II: pupils Equal, round, reactive to light  III,VII: ptosis None  III,IV,VI: extraocular muscles  Full ROM  V: mastication Normal  V: facial light touch sensation  Normal  V,VII: corneal reflex  Present  VII: facial muscle function - upper  Normal  VII: facial muscle function - lower Normal  VIII: hearing Not tested  IX: soft palate elevation  Normal  IX,X: gag reflex Present  XI: trapezius strength  5/5  XI: sternocleidomastoid strength 5/5  XI: neck flexion strength  5/5  XII: tongue strength  Normal    Data Review Lab Results  Component Value Date   WBC 7.5 05/06/2022   HGB 11.7 (L) 05/06/2022   HCT 36.4 05/06/2022   MCV 95.0 05/06/2022   PLT 250 05/06/2022   Lab Results  Component Value Date   NA 140 05/06/2022   K 3.9 05/06/2022   CL 106 05/06/2022   CO2 21 (L) 05/06/2022   BUN 24 (H) 05/06/2022   CREATININE 2.00 (H) 05/06/2022   GLUCOSE 88 05/06/2022   Lab Results  Component Value Date   INR 0.96 05/06/2013    Radiology: CT ABDOMEN PELVIS WO CONTRAST  Result Date: 05/06/2022 CLINICAL DATA:  History of lung cancer. New hemorrhagic mass in the cerebellum. Restage lung cancer. * Tracking Code: BO * EXAM: CT CHEST, ABDOMEN AND PELVIS WITHOUT CONTRAST TECHNIQUE: Multidetector CT imaging of the chest, abdomen and pelvis was performed following the standard protocol  without IV contrast. RADIATION DOSE REDUCTION: This exam was performed according to the departmental dose-optimization program which includes automated exposure control, adjustment of the mA and/or kV according to patient size and/or use of iterative reconstruction technique. COMPARISON:  CT chest from 11/28/2021 FINDINGS: CT CHEST FINDINGS Cardiovascular: Heart size is normal. Aortic atherosclerosis. Coronary artery calcifications. No pericardial effusion. Mediastinum/Nodes: Asymmetric heterogeneous enlargement of the left lobe of thyroid gland. This has been evaluated on previous imaging. (ref: J Am Coll Radiol. 2015 Feb;12(2): 143-50). Trachea appears patent and midline. Normal appearance of the esophagus. No mediastinal or axillary adenopathy. Hilar structures are suboptimally evaluated due to lack of IV  contrast. Lungs/Pleura: Central airways appear patent. No pleural effusion or airspace consolidation. Mild to moderate emphysema. Status post left lower lobectomy. Scattered lung nodules are identified, including: -within the periphery of the right lower lobe there is an irregular nodule measuring 1 cm, image 63/4. Previously this measured 0.9 cm. -Sub solid nodule in the posterior left lower lung measures 0.8 cm, image 93/4. Formally 0.6 cm. -Subpleural nodular density over the posterolateral left lower lung measures 1.2 x 0.5 cm, image 95/4. Previously this measured the same. Musculoskeletal: Status post ACDF within the cervical spine. No acute or suspicious bone lesions. No chest wall mass. CT ABDOMEN PELVIS FINDINGS Hepatobiliary: There are 2 small low-attenuation foci within the left lobe of liver and posteromedial right lobe of liver measuring up to 8 mm. These are technically too small to reliably characterize but appear unchanged. Gallbladder is unremarkable. Pancreas: Unremarkable. No pancreatic ductal dilatation or surrounding inflammatory changes. Spleen: Normal in size without focal abnormality.  Adrenals/Urinary Tract: Normal appearance of the adrenal glands. No nephrolithiasis or hydronephrosis. Bilateral simple appearing kidney cysts are noted. The largest is in the lateral cortex of the right kidney measuring 3.6 cm, image 65/3. No follow-up imaging recommended. No hydronephrosis identified bilaterally. Urinary bladder appears normal. Stomach/Bowel: Small hiatal hernia. Stomach is nondistended. The appendix is visualized and appears normal. No pathologic dilatation of the large or small bowel loops. Distal colonic diverticula noted without signs of acute diverticulitis. Vascular/Lymphatic: Aortic atherosclerosis. No aneurysm. No signs of abdominopelvic adenopathy. Reproductive: Partially calcified fibroid is noted anteriorly measuring 1.9 cm. No adnexal mass identified. Other: No ascites or focal fluid collections. No signs of peritoneal nodularity or mass. Musculoskeletal: Previous PLIF at L4-5. Degenerative disc disease is noted at the remaining lumbar spine levels. Bilateral facet arthropathy. No acute or suspicious osseous findings. IMPRESSION: 1. Several bilateral lung nodules are again noted. There has been no significant change in the size of these nodules (within 1-2 mm). No new pulmonary lesions identified when compared with previous exam. 2. No evidence for metastatic disease to the abdomen or pelvis. 3. Coronary artery calcifications. 4. Aortic Atherosclerosis (ICD10-I70.0) and Emphysema (ICD10-J43.9). Electronically Signed   By: Signa Kell M.D.   On: 05/06/2022 13:56   MR BRAIN WO CONTRAST  Result Date: 05/06/2022 CLINICAL DATA:  Memory impairment.  Recent headaches and imbalance EXAM: MRI HEAD WITHOUT CONTRAST TECHNIQUE: Multiplanar, multiecho pulse sequences of the brain and surrounding structures were obtained without intravenous contrast. COMPARISON:  10/16/2005 FINDINGS: Brain: Isointense mass with hemorrhagic features (hemosiderin at its inferior periphery) measuring up to 2.7  cm. Moderate vasogenic edema in the mass causes effacement of the lower fourth ventricle with third and lateral ventriculomegaly. Mild periventricular FLAIR hyperintensity at the lateral ventricles which is more likely chronic small vessel ischemia than transependymal flow given incomplete appearance in the degree of ventriculomegaly. Gliosis and volume loss at the anterior left temporal lobe which was also seen on prior, possibly posttraumatic in this location. Case discussed with the patient's daughter who is driving the patient and ER referral has been made. [facility] made aware. Vascular: Major flow voids are preserved Skull and upper cervical spine: Normal marrow signal. Sinuses/Orbits: No significant finding. Other: Small proteinaceous cystic intensity anterior to the hyoid, consistent with thyroglossal duct cyst measuring 1 cm. IMPRESSION: Nearly 3 cm hemorrhagic mass with vasogenic edema in the lower cerebellum. There is effacement of the fourth ventricle with ventriculomegaly. Presumed metastasis in this patient with history malignancy, recommend postcontrast assessment. Electronically Signed  By: Tiburcio Pea M.D.   On: 05/06/2022 09:48      Assessment/Plan: Estimated body mass index is 26.95 kg/m as calculated from the following:   Height as of 04/12/22: 5' (1.524 m).   Weight as of 04/12/22: 62.6 kg.   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  History of lung cancer.  Need repeat staging  She will need an MRI of the brain with and without contrast to evaluate for further lesions  No indication for urgent surgery, and in fact there will need to be frank discussions about goals of care and how aggressive she would like to be.  This will be dependent upon repeat staging   Tia Alert 05/06/2022 3:03 PM

## 2022-05-06 NOTE — ED Notes (Signed)
Back from MRI, no changes.

## 2022-05-06 NOTE — ED Notes (Signed)
[facility] at [facility]

## 2022-05-06 NOTE — ED Provider Notes (Signed)
[place] [facility]GENC[facility] DEPARTMENT AT [facility] Provider Note   CSN: [id number] Arrival date & time: 05/06/22  1000     History  Chief Complaint  Patient presents with   Hemorrhage per MRI    [name] is a 87 y.o. female.  Patient is an 87 year old female with a history of thrombocytopenia, hypertension, hypothyroidism, CKD and prior lung cancer who completed chemotherapy in 2015 and has been cancer free who is presenting today from radiology after an abnormal MRI of her brain.  Patient and her daughter report that for the last month or so she has been having bad headaches with intermittent vomiting on occasion and some issues with her memory.  She denies any aphasia, swallowing difficulty, unilateral leg pain, weakness or numbness.  She denies any visual changes.  She has not had any falls.  She is still able to drive her car, fix meals for herself and do her normal daily activities.  Her daughter denies any history of her being found unresponsive or with seizure-like activity.  Because the headaches were persistent they followed up with her doctor who ordered an MRI.  The history is provided by the patient and a caregiver.       Home Medications Prior to Admission medications   Medication Sig Start Date End Date Taking? Authorizing Provider  acetaminophen (TYLENOL) 500 MG tablet Take 1 tablet (500 mg total) by mouth every 6 (six) hours as needed. 01/15/17   Georgetta Haber, NP  atorvastatin (LIPITOR) 10 MG tablet TAKE 1 TABLET (10 MG TOTAL) BY MOUTH DAILY AT 6 PM. 06/22/21   Georgina Quint, MD  chlorthalidone (HYGROTON) 25 MG tablet TAKE 1 TABLET BY MOUTH EVERY DAY 07/17/21   Georgina Quint, MD  Cholecalciferol (VITAMIN D-3) 1000 units CAPS Take 1 capsule (1,000 Units total) by mouth daily. 08/02/17   Doristine Bosworth, MD  clopidogrel (PLAVIX) 75 MG tablet TAKE 1 TABLET BY MOUTH EVERY DAY WITH BREAKFAST 04/17/22   Georgina Quint, MD  gabapentin  (NEURONTIN) 300 MG capsule Take 300 mg by mouth daily.    [provider]  magnesium oxide (MAG-OX) 400 MG tablet Take 1 tablet (400 mg total) by mouth daily. 08/02/17   Doristine Bosworth, MD  methocarbamol (ROBAXIN) 500 MG tablet TAKE 1 TABLET BY MOUTH AT BEDTIME AS NEEDED FOR MUSCLE SPASMS. 04/21/22   Georgina Quint, MD  metoprolol succinate (TOPROL-XL) 25 MG 24 hr tablet TAKE 1 TABLET (25 MG) BY ORAL ROUTE TWICE A DAY 08/25/21   Georgina Quint, MD  Multiple Vitamin (MULTIVITAMIN) tablet Take 1 tablet by mouth at bedtime.     [provider]  telmisartan (MICARDIS) 40 MG tablet TAKE 1 TABLET BY MOUTH EVERY DAY 07/17/21   Georgina Quint, MD      Allergies    Aspirin, Lactose intolerance (gi), Adhesive [tape], Lisinopril, and Percocet [oxycodone-acetaminophen]    Review of Systems   Review of Systems  Physical Exam Updated Vital Signs BP (!) 148/54   Pulse (!) 49   Temp (!) 97.3 F (36.3 C)   Resp 18   SpO2 100%  Physical Exam Vitals and nursing note reviewed.  Constitutional:      General: She is not in acute distress.    Appearance: She is well-developed.  HENT:     Head: Normocephalic and atraumatic.  Eyes:     General: No visual field deficit.    Extraocular Movements: Extraocular movements intact.  Conjunctiva/sclera: Conjunctivae normal.     Pupils: Pupils are equal, round, and reactive to light.  Cardiovascular:     Rate and Rhythm: Normal rate and regular rhythm.     Heart sounds: Normal heart sounds. No murmur heard.    No friction rub.  Pulmonary:     Effort: Pulmonary effort is normal.     Breath sounds: Normal breath sounds. No wheezing or rales.  Abdominal:     General: Bowel sounds are normal. There is no distension.     Palpations: Abdomen is soft.     Tenderness: There is no abdominal tenderness. There is no guarding or rebound.  Musculoskeletal:        General: No tenderness. Normal range of motion.     Comments: No  edema  Skin:    General: Skin is warm and dry.     Findings: No rash.  Neurological:     Mental Status: She is alert and oriented to person, place, and time.     Cranial Nerves: No cranial nerve deficit or dysarthria.     Motor: Motor function is intact. No weakness or pronator drift.     Coordination: Coordination is intact. Heel to [facility] Test normal.     Gait: Gait is intact.  Psychiatric:        Behavior: Behavior normal.     ED Results / Procedures / Treatments   Labs (all labs ordered are listed, but only abnormal results are displayed) Labs Reviewed  CBC WITH DIFFERENTIAL/PLATELET - Abnormal; Notable for the following components:      Result Value   RBC 3.83 (*)    Hemoglobin 11.7 (*)    All other components within normal limits  BASIC METABOLIC PANEL - Abnormal; Notable for the following components:   CO2 21 (*)    BUN 24 (*)    Creatinine, Ser 2.00 (*)    GFR, Estimated 24 (*)    All other components within normal limits    EKG None  Radiology MR BRAIN WO CONTRAST  Result Date: 05/06/2022 CLINICAL DATA:  Memory impairment.  Recent headaches and imbalance EXAM: MRI HEAD WITHOUT CONTRAST TECHNIQUE: Multiplanar, multiecho pulse sequences of the brain and surrounding structures were obtained without intravenous contrast. COMPARISON:  10/16/2005 FINDINGS: Brain: Isointense mass with hemorrhagic features (hemosiderin at its inferior periphery) measuring up to 2.7 cm. Moderate vasogenic edema in the mass causes effacement of the lower fourth ventricle with third and lateral ventriculomegaly. Mild periventricular FLAIR hyperintensity at the lateral ventricles which is more likely chronic small vessel ischemia than transependymal flow given incomplete appearance in the degree of ventriculomegaly. Gliosis and volume loss at the anterior left temporal lobe which was also seen on prior, possibly posttraumatic in this location. Case discussed with the patient's daughter who is driving  the patient and ER referral has been made. [facility] made aware. Vascular: Major flow voids are preserved Skull and upper cervical spine: Normal marrow signal. Sinuses/Orbits: No significant finding. Other: Small proteinaceous cystic intensity anterior to the hyoid, consistent with thyroglossal duct cyst measuring 1 cm. IMPRESSION: Nearly 3 cm hemorrhagic mass with vasogenic edema in the lower cerebellum. There is effacement of the fourth ventricle with ventriculomegaly. Presumed metastasis in this patient with history malignancy, recommend postcontrast assessment. Electronically Signed   By: Tiburcio Pea M.D.   On: 05/06/2022 09:48    Procedures Procedures    Medications Ordered in ED Medications  dexamethasone (DECADRON) injection 4 mg (4 mg Intravenous Given 05/06/22  1143)    ED Course/ Medical Decision Making/ A&P                             Medical Decision Making Amount and/or Complexity of Data Reviewed Labs: ordered. Radiology: ordered.  Risk Prescription drug management.   Pt with multiple medical problems and comorbidities and presenting today with a complaint that caries a high risk for morbidity and mortality.  Here today with ongoing headaches and an MRI outpatient done today that showed a 3 cm hemorrhagic mass with vasogenic edema in the lower cerebellum.  There is effacement of the fourth ventricle with ventriculomegaly concern for metastatic process.  Patient is neurologically intact at this time however given these findings we will give patient a dose of Decadron.  Will consult neurosurgery due to the ventriculomegaly for further recommendations.  Patient does take Plavix.  Findings discussed with the patient and her daughter.  12:56 PM Spoke with [facility] who will come and see the pt and advise on further care.  12:56 PM Dr. Yetta Barre with neurosurgery evaluated the patient and at this time there is nothing neurosurgical that needs to be done immediately.  Patient needs  MRI with and without contrast of her brain for further evaluation as well as a CT of her chest abdomen pelvis.  Patient is adamant that she does not want to stay at the hospital.  Discussed this with the patient and her daughter.  Patient is willing to stay to get the imaging done but then would like to follow-up with oncology this week.  Will place an ambulatory referral for oncology as well as Dr. Barbaraann Cao.  Speaking with Dr. Yetta Barre patient can continue taking her Plavix at this time.  He did not feel that she needed outpatient steroids.         Final Clinical Impression(s) / ED Diagnoses Final diagnoses:  None    Rx / DC Orders ED Discharge Orders     None         Gwyneth Sprout, MD 05/06/22 1517

## 2022-05-06 NOTE — ED Notes (Signed)
No changes, alert, NAD, calm, interactive, to CT via stretcher.

## 2022-05-06 NOTE — ED Notes (Signed)
Alert, NAD, calm, interactive, respe e/u, steady gait. Family at [facility].

## 2022-05-06 NOTE — ED Notes (Signed)
EDP at [facility] 

## 2022-05-06 NOTE — ED Notes (Signed)
To MRI

## 2022-05-06 NOTE — ED Triage Notes (Signed)
Pt presents with daughter immediately after having MRI this morning and being advised to come to the ED based on the results. Reports having headache for 2-3 weeks. Impression from MRI: "Nearly 3 cm hemorrhagic mass with vasogenic edema in the lower cerebellum. There is effacement of the fourth ventricle with ventriculomegaly. Presumed metastasis in this patient with history malignancy, recommend postcontrast assessment."

## 2022-05-06 NOTE — ED Notes (Signed)
Up to b/r, steady gait 

## 2022-05-08 ENCOUNTER — Inpatient Hospital Stay: Payer: 59 | Attending: Hematology and Oncology | Admitting: Hematology and Oncology

## 2022-05-08 ENCOUNTER — Encounter: Payer: Self-pay | Admitting: Hematology and Oncology

## 2022-05-08 ENCOUNTER — Telehealth: Payer: Self-pay

## 2022-05-08 ENCOUNTER — Other Ambulatory Visit: Payer: Self-pay | Admitting: Radiation Therapy

## 2022-05-08 ENCOUNTER — Other Ambulatory Visit: Payer: Self-pay

## 2022-05-08 ENCOUNTER — Encounter: Payer: Self-pay | Admitting: Internal Medicine

## 2022-05-08 ENCOUNTER — Other Ambulatory Visit: Payer: Self-pay | Admitting: Hematology and Oncology

## 2022-05-08 ENCOUNTER — Inpatient Hospital Stay (HOSPITAL_BASED_OUTPATIENT_CLINIC_OR_DEPARTMENT_OTHER): Payer: 59 | Admitting: Internal Medicine

## 2022-05-08 VITALS — BP 122/60 | HR 50 | Temp 97.7°F | Resp 18 | Wt 138.6 lb

## 2022-05-08 VITALS — BP 148/55 | HR 54 | Temp 97.4°F | Resp 18 | Ht 60.0 in | Wt 138.2 lb

## 2022-05-08 DIAGNOSIS — C7931 Secondary malignant neoplasm of brain: Secondary | ICD-10-CM

## 2022-05-08 DIAGNOSIS — Z801 Family history of malignant neoplasm of trachea, bronchus and lung: Secondary | ICD-10-CM | POA: Diagnosis not present

## 2022-05-08 DIAGNOSIS — Z85118 Personal history of other malignant neoplasm of bronchus and lung: Secondary | ICD-10-CM | POA: Diagnosis present

## 2022-05-08 DIAGNOSIS — Z803 Family history of malignant neoplasm of breast: Secondary | ICD-10-CM | POA: Diagnosis not present

## 2022-05-08 DIAGNOSIS — Z87891 Personal history of nicotine dependence: Secondary | ICD-10-CM | POA: Diagnosis not present

## 2022-05-08 MED ORDER — LORAZEPAM 0.5 MG PO TABS: 0.5000 mg | ORAL_TABLET | Freq: Two times a day (BID) | ORAL | 0 refills | Status: DC | PRN

## 2022-05-08 NOTE — Transitions of Care (Post Inpatient/ED Visit) (Unsigned)
   05/08/2022  Name: [name] MRN: [id number] DOB: 1935/11/13  Today's TOC FU Call Status: Today's TOC FU Call Status:: Unsuccessul Call (1st Attempt) Unsuccessful Call (1st Attempt) Date: 05/08/22  Attempted to reach the patient regarding the most recent Inpatient/ED visit.  Follow Up Plan: Additional outreach attempts will be made to reach the patient to complete the Transitions of Care (Post Inpatient/ED visit) call.   Signature Karena Addison, LPN [facility] Nurse Health Advisor Direct Dial [phone]

## 2022-05-08 NOTE — Assessment & Plan Note (Signed)
I have reviewed her recent CT imaging as well as MRI There is nothing definitive for source of brain metastasis I recommend PET/CT imaging for evaluation and she is in agreement I will see if we can add molecular testing to her previous surgical sample

## 2022-05-08 NOTE — Telephone Encounter (Signed)
Called and spoke with daughter. Offered appt today at 2:45 pm with Dr. Bertis Ruddy. Daughter will speak with Mom and ask that I call her back.

## 2022-05-08 NOTE — Assessment & Plan Note (Signed)
She denies symptomatic headache or nausea today She has been evaluated by neuro oncologist with plan for treatment soon We discussed signs and symptoms to watch out for disease progression For now, I would defer to neuro oncologist and radiation team for treatment

## 2022-05-08 NOTE — Telephone Encounter (Signed)
Called and spoke with [facility] pathology to add foundation one to session [phone].

## 2022-05-08 NOTE — Progress Notes (Signed)
[facility] at [facility] [address]  [place], [place] [address] [phone]   New Patient Evaluation  Date of Service: 05/08/22 Patient Name: SUEANN BROWNLEY Patient MRN: [id number] Patient DOB: 1935/02/28 Provider: Henreitta Leber, MD  Identifying Statement:  CHAUNDRA ABREU is a 87 y.o. female with Malignant neoplasm metastatic to brain who presents for initial consultation and evaluation regarding cancer associated neurologic deficits.    Referring Provider: Georgina Quint, MD [address] [place],  [place] [address]  Primary Cancer:  Oncologic History: Oncology History Overview Note  Non-small cell lung cancer, adenocarcinoma   Primary site: Lung (Left)   Staging method: AJCC 7th Edition   Clinical: Stage IB (T2a, N0, M0) signed by Artis Delay, MD on 04/29/2013  9:01 PM   Pathologic: Stage IB (T2a, N0, cM0) signed by Delight Ovens, MD on 04/09/2013 12:51 PM   Summary: Stage IB (T2a, N0, cM0)     History of lung cancer  02/26/2013 Imaging   CXR for evaluation of cough showed new left lung mass   02/28/2013 Imaging   CT chest showed left new lung nodule   03/14/2013 Imaging   PET/CT scan showed localized disease in the left lung   04/07/2013 Surgery   She underwent bronchoscopy and left lower lobe resection and lymph node sampling with negative margins   05/06/2013 Procedure   The patient has placement of Infuse-a-Port   05/21/2013 - 07/30/2013 Chemotherapy   She completed 4 cycles of adjuvant chemotherapy with navelbine and cisplatin   07/01/2013 Imaging   CT scan of the chest shows stable appearance.   07/02/2013 Adverse Reaction   Dose of chemotherapy was adjusted further due to severe anemia.   12/31/2013 Imaging   CT scan of the chest show no evidence of disease recurrence   07/03/2014 Imaging   Chest x-ray looks normal   12/31/2014 Imaging   Ct chest showed no evidence of recurrence   05/31/2015 Imaging   Status post resection of  a left lower lobe lung lesion without findings for recurrent tumor or metastatic disease.    05/29/2016 Imaging   1. Stable exam. Status post left lower lobectomy without evidence for new or progressive findings. 2. Emphysema. 3. Thoracic aortic atherosclerosis   05/30/2017 Imaging   1. Slight interval increase in size of right upper lobe nodule. Recommend attention on follow-up. 2. Stable appearance of the left hemithorax with left lower lobectomy. 3. Aortic Atherosclerosis (ICD10-I70.0) and Emphysema (ICD10-J43.9).   11/13/2018 Imaging   1. No evidence of recurrent or metastatic disease. 2. Clustered peribronchovascular nodularity in the right upper lobe appears stable. 3. 7 mm left upper lobe ground-glass nodule, stable. Continued attention on follow-up exams is warranted. 4. Aortic atherosclerosis (ICD10-170.0). Coronary artery calcification. 5.  Emphysema (ICD10-J43.9).     History of Present Illness: The patient's records from the referring physician were obtained and reviewed and the patient interviewed to confirm this HPI.  Aven Cegielski Aikey presents today to review recent MRI, CNS complaints.  She describes several weeks history of headaches, new from prior.  She attributes them to chemicals sprayed in her house for fleas and bugs, noting that the headache actually resolved once the spray was discontinued last week.  PCP ordered a brain MRI, which demonstrated an enhancing mass in the posterior fossa.  She denies any issues with balance, gait is independent.  No other systemic complains, hasn't followed up regarding history of lung cancer since 2020.  Medications:  Current Outpatient Medications on File Prior to Visit  Medication Sig Dispense Refill   acetaminophen (TYLENOL) 500 MG tablet Take 1 tablet (500 mg total) by mouth every 6 (six) hours as needed. 30 tablet 0   atorvastatin (LIPITOR) 10 MG tablet TAKE 1 TABLET (10 MG TOTAL) BY MOUTH DAILY AT 6 PM. 90 tablet 3   chlorthalidone  (HYGROTON) 25 MG tablet TAKE 1 TABLET BY MOUTH EVERY DAY 90 tablet 3   Cholecalciferol (VITAMIN D-3) 1000 units CAPS Take 1 capsule (1,000 Units total) by mouth daily. 90 capsule 1   clopidogrel (PLAVIX) 75 MG tablet TAKE 1 TABLET BY MOUTH EVERY DAY WITH BREAKFAST 90 tablet 2   gabapentin (NEURONTIN) 300 MG capsule Take 300 mg by mouth daily.     magnesium oxide (MAG-OX) 400 MG tablet Take 1 tablet (400 mg total) by mouth daily. 90 tablet 1   methocarbamol (ROBAXIN) 500 MG tablet TAKE 1 TABLET BY MOUTH AT BEDTIME AS NEEDED FOR MUSCLE SPASMS. 30 tablet 2   metoprolol succinate (TOPROL-XL) 25 MG 24 hr tablet TAKE 1 TABLET (25 MG) BY ORAL ROUTE TWICE A DAY 180 tablet 3   Multiple Vitamin (MULTIVITAMIN) tablet Take 1 tablet by mouth at bedtime.      telmisartan (MICARDIS) 40 MG tablet TAKE 1 TABLET BY MOUTH EVERY DAY 90 tablet 3   No current facility-administered medications on file prior to visit.    Allergies:  Allergies  Allergen Reactions   Aspirin Other (See Comments)    REACTION: stomach upset   Lactose Intolerance (Gi) Diarrhea and Other (See Comments)    "can't drink milk. It tears my stomach up."   Adhesive [Tape] Rash   Lisinopril Other (See Comments)    cough   Percocet [Oxycodone-Acetaminophen] Itching   Past Medical History:  Past Medical History:  Diagnosis Date   Arthritis    Benign hypertensive kidney disease with chronic kidney disease stage I through stage IV, or unspecified(403.10)    sees dr. Hyman Hopes again in april   Cancer    lung ca dx'd 02/2013   Chronic kidney disease, stage III (moderate)    Cough 02/26/2013   GERD (gastroesophageal reflux disease)    Headache    Hypertension    Hyperthyroidism    took iodine treatment for this   Lung mass 02/27/2013   Neurologic disorder    2009   Shortness of breath    exertion   Thrombocytopenia, unspecified 02/26/2013   Unspecified vitamin D deficiency    Past Surgical History:  Past Surgical History:  Procedure  Laterality Date   BACK SURGERY     EYE SURGERY     cataract   EYE SURGERY Left    open up tear duct   LYMPH NODE DISSECTION Left 04/07/2013   Procedure: LYMPH NODE DISSECTION;  Surgeon: Delight Ovens, MD;  Location: [facility] OR;  Service: Thoracic;  Laterality: Left;   NECK SURGERY     PORT-A-CATH REMOVAL Left 01/09/2014   Procedure: REMOVAL PORT-A-CATH;  Surgeon: Delight Ovens, MD;  Location: [facility] OR;  Service: Thoracic;  Laterality: Left;   PORTACATH PLACEMENT Left 05/06/2013   Procedure: INSERTION PORT-A-CATH;  Surgeon: Delight Ovens, MD;  Location: [facility] OR;  Service: Thoracic;  Laterality: Left;   VIDEO ASSISTED THORACOSCOPY (VATS)/ LOBECTOMY Left 04/07/2013   Procedure: VIDEO ASSISTED THORACOSCOPY (VATS)/ LOBECTOMY; INSERTION OF ON-Q PUMP;  Surgeon: Delight Ovens, MD;  Location: [facility] OR;  Service: Thoracic;  Laterality: Left;   VIDEO BRONCHOSCOPY N/A  04/07/2013   Procedure: VIDEO BRONCHOSCOPY;  Surgeon: Delight Ovens, MD;  Location: [facility] OR;  Service: Thoracic;  Laterality: N/A;   Social History:  Social History   Socioeconomic History   Marital status: Single    Spouse name: Not on file   Number of children: 2   Years of education: 4   Highest education level: Not on file  Occupational History   Not on file  Tobacco Use   Smoking status: Former    Packs/day: 0.50    Years: 60.00    Additional pack years: 0.00    Total pack years: 30.00    Types: Cigarettes    Quit date: 02/25/2012    Years since quitting: 10.2   Smokeless tobacco: Never  Vaping Use   Vaping Use: Never used  Substance and Sexual Activity   Alcohol use: No   Drug use: No   Sexual activity: Not on file  Other Topics Concern   Not on file  Social History Narrative   Right handed   Drinks caffeine   One story home   Lives alone   retired   Chief Executive Officer Determinants of Health   Financial Resource Strain: Low Risk  (11/11/2020)   Overall Financial Resource Strain (CARDIA)    Difficulty of Paying  Living Expenses: Not hard at all  Food Insecurity: No Food Insecurity (05/08/2022)   Hunger Vital Sign    Worried About Running Out of Food in the Last Year: Never true    Ran Out of Food in the Last Year: Never true  Transportation Needs: No Transportation Needs (05/08/2022)   PRAPARE - Administrator, Civil Service (Medical): No    Lack of Transportation (Non-Medical): No  Physical Activity: Sufficiently Active (11/11/2020)   Exercise Vital Sign    Days of Exercise per Week: 5 days    Minutes of Exercise per Session: 30 min  Stress: No Stress Concern Present (11/11/2020)   [employer] of Occupational Health - Occupational Stress Questionnaire    Feeling of Stress : Not at all  Social Connections: Moderately Integrated (11/11/2020)   Social Connection and Isolation Panel [NHANES]    Frequency of Communication with Friends and Family: More than three times a week    Frequency of Social Gatherings with Friends and Family: More than three times a week    Attends Religious Services: More than 4 times per year    Active Member of [employer] or Organizations: Yes    Attends Engineer, structural: More than 4 times per year    Marital Status: Never married  Intimate Partner Violence: Not At Risk (05/08/2022)   Humiliation, Afraid, Rape, and Kick questionnaire    Fear of Current or Ex-Partner: No    Emotionally Abused: No    Physically Abused: No    Sexually Abused: No   Family History:  Family History  Problem Relation Age of Onset   Breast cancer Mother    Lung cancer Father        smoked   Lung cancer Brother        smoked   Rectal cancer Neg Hx    Stomach cancer Neg Hx    Esophageal cancer Neg Hx    Colon cancer Neg Hx     Review of Systems: Constitutional: Doesn't report fevers, chills or abnormal weight loss Eyes: Doesn't report blurriness of vision Ears, nose, mouth, throat, and face: Doesn't report sore throat Respiratory: Doesn't report cough,  dyspnea or wheezes  Cardiovascular: Doesn't report palpitation, chest discomfort  Gastrointestinal:  Doesn't report nausea, constipation, diarrhea GU: Doesn't report incontinence Skin: Doesn't report skin rashes Neurological: Per HPI Musculoskeletal: Doesn't report joint pain Behavioral/Psych: Doesn't report anxiety  Physical Exam: Vitals:   05/08/22 1328  BP: 122/60  Pulse: (!) 50  Resp: 18  Temp: 97.7 F (36.5 C)  SpO2: 99%   KPS: 80. General: Alert, cooperative, pleasant, in no acute distress Head: Normal EENT: No conjunctival injection or scleral icterus.  Lungs: Resp effort normal Cardiac: Regular rate Abdomen: Non-distended abdomen Skin: No rashes cyanosis or petechiae. Extremities: No clubbing or edema  Neurologic Exam: Mental Status: Awake, alert, attentive to examiner. Oriented to self and environment. Language is fluent with intact comprehension.  Cranial Nerves: Visual acuity is grossly normal. Visual fields are full. Extra-ocular movements intact. No ptosis. Face is symmetric Motor: Tone and bulk are normal. Power is full in both arms and legs. Reflexes are symmetric, no pathologic reflexes present.  Sensory: Intact to light touch Gait: Normal.   Labs: I have reviewed the data as listed    Component Value Date/Time   NA 140 05/06/2022 1126   NA 144 07/30/2018 1217   NA 144 05/29/2016 1119   K 3.9 05/06/2022 1126   K 4.4 05/29/2016 1119   CL 106 05/06/2022 1126   CO2 21 (L) 05/06/2022 1126   CO2 23 05/29/2016 1119   GLUCOSE 88 05/06/2022 1126   GLUCOSE 91 05/29/2016 1119   BUN 24 (H) 05/06/2022 1126   BUN 21 07/30/2018 1217   BUN 20.8 05/29/2016 1119   CREATININE 2.00 (H) 05/06/2022 1126   CREATININE 1.7 (H) 05/29/2016 1119   CALCIUM 9.7 05/06/2022 1126   CALCIUM 9.5 05/29/2016 1119   PROT 7.3 05/23/2021 1336   PROT 7.1 07/30/2018 1217   PROT 6.8 05/29/2016 1119   ALBUMIN 4.2 05/23/2021 1336   ALBUMIN 4.6 07/30/2018 1217   ALBUMIN 3.7  05/29/2016 1119   AST 24 05/23/2021 1336   AST 28 05/29/2016 1119   ALT 17 05/23/2021 1336   ALT 29 05/29/2016 1119   ALKPHOS 64 05/23/2021 1336   ALKPHOS 68 05/29/2016 1119   BILITOT 0.5 05/23/2021 1336   BILITOT 0.3 07/30/2018 1217   BILITOT 0.24 05/29/2016 1119   GFRNONAA 24 (L) 05/06/2022 1126   GFRAA 32 (L) 11/04/2018 1120   Lab Results  Component Value Date   WBC 7.5 05/06/2022   NEUTROABS 5.3 05/06/2022   HGB 11.7 (L) 05/06/2022   HCT 36.4 05/06/2022   MCV 95.0 05/06/2022   PLT 250 05/06/2022    Imaging:  MR BRAIN W CONTRAST  Result Date: 05/06/2022 CLINICAL DATA:  Brain metastases, unknown primary EXAM: MRI HEAD WITH CONTRAST TECHNIQUE: Multiplanar, multiecho pulse sequences of the brain and surrounding structures were obtained with intravenous contrast. CONTRAST:  6.32mL GADAVIST GADOBUTROL 1 MMOL/ML IV SOLN COMPARISON:  Same day MRI head. FINDINGS: Postcontrast only imaging was performed to further evaluate the abnormality seen on same day noncontrast MRI. The mass within the left cerebellum is enhancing, compatible with a metastasis given the clinical history. No other enhancing lesions identified. Please see same day MRI head for further characterization, including effacement of the fourth ventricle in ventriculomegaly. IMPRESSION: Postcontrast only imaging. The mass within the left cerebellum is enhancing, compatible with a metastasis given the clinical history. No other enhancing lesions identified. Electronically Signed   By: Feliberto Harts M.D.   On: 05/06/2022 15:17   CT Chest Wo Contrast  Result Date:  05/06/2022 CLINICAL DATA:  History of lung cancer. New hemorrhagic mass in the cerebellum. Restage lung cancer. * Tracking Code: BO * EXAM: CT CHEST, ABDOMEN AND PELVIS WITHOUT CONTRAST TECHNIQUE: Multidetector CT imaging of the chest, abdomen and pelvis was performed following the standard protocol without IV contrast. RADIATION DOSE REDUCTION: This exam was performed  according to the departmental dose-optimization program which includes automated exposure control, adjustment of the mA and/or kV according to patient size and/or use of iterative reconstruction technique. COMPARISON:  CT chest from 11/28/2021 FINDINGS: CT CHEST FINDINGS Cardiovascular: Heart size is normal. Aortic atherosclerosis. Coronary artery calcifications. No pericardial effusion. Mediastinum/Nodes: Asymmetric heterogeneous enlargement of the left lobe of thyroid gland. This has been evaluated on previous imaging. (ref: J Am Coll Radiol. 2015 Feb;12(2): 143-50). Trachea appears patent and midline. Normal appearance of the esophagus. No mediastinal or axillary adenopathy. Hilar structures are suboptimally evaluated due to lack of IV contrast. Lungs/Pleura: Central airways appear patent. No pleural effusion or airspace consolidation. Mild to moderate emphysema. Status post left lower lobectomy. Scattered lung nodules are identified, including: -within the periphery of the right lower lobe there is an irregular nodule measuring 1 cm, image 63/4. Previously this measured 0.9 cm. -Sub solid nodule in the posterior left lower lung measures 0.8 cm, image 93/4. Formally 0.6 cm. -Subpleural nodular density over the posterolateral left lower lung measures 1.2 x 0.5 cm, image 95/4. Previously this measured the same. Musculoskeletal: Status post ACDF within the cervical spine. No acute or suspicious bone lesions. No chest wall mass. CT ABDOMEN PELVIS FINDINGS Hepatobiliary: There are 2 small low-attenuation foci within the left lobe of liver and posteromedial right lobe of liver measuring up to 8 mm. These are technically too small to reliably characterize but appear unchanged. Gallbladder is unremarkable. Pancreas: Unremarkable. No pancreatic ductal dilatation or surrounding inflammatory changes. Spleen: Normal in size without focal abnormality. Adrenals/Urinary Tract: Normal appearance of the adrenal glands. No  nephrolithiasis or hydronephrosis. Bilateral simple appearing kidney cysts are noted. The largest is in the lateral cortex of the right kidney measuring 3.6 cm, image 65/3. No follow-up imaging recommended. No hydronephrosis identified bilaterally. Urinary bladder appears normal. Stomach/Bowel: Small hiatal hernia. Stomach is nondistended. The appendix is visualized and appears normal. No pathologic dilatation of the large or small bowel loops. Distal colonic diverticula noted without signs of acute diverticulitis. Vascular/Lymphatic: Aortic atherosclerosis. No aneurysm. No signs of abdominopelvic adenopathy. Reproductive: Partially calcified fibroid is noted anteriorly measuring 1.9 cm. No adnexal mass identified. Other: No ascites or focal fluid collections. No signs of peritoneal nodularity or mass. Musculoskeletal: Previous PLIF at L4-5. Degenerative disc disease is noted at the remaining lumbar spine levels. Bilateral facet arthropathy. No acute or suspicious osseous findings. IMPRESSION: 1. Several bilateral lung nodules are again noted. There has been no significant change in the size of these nodules (within 1-2 mm). No new pulmonary lesions identified when compared with previous exam. 2. No evidence for metastatic disease to the abdomen or pelvis. 3. Coronary artery calcifications. 4. Aortic Atherosclerosis (ICD10-I70.0) and Emphysema (ICD10-J43.9). Electronically Signed   By: Signa Kell M.D.   On: 05/06/2022 13:56   CT ABDOMEN PELVIS WO CONTRAST  Result Date: 05/06/2022 CLINICAL DATA:  History of lung cancer. New hemorrhagic mass in the cerebellum. Restage lung cancer. * Tracking Code: BO * EXAM: CT CHEST, ABDOMEN AND PELVIS WITHOUT CONTRAST TECHNIQUE: Multidetector CT imaging of the chest, abdomen and pelvis was performed following the standard protocol without IV contrast. RADIATION DOSE REDUCTION: This  exam was performed according to the departmental dose-optimization program which includes  automated exposure control, adjustment of the mA and/or kV according to patient size and/or use of iterative reconstruction technique. COMPARISON:  CT chest from 11/28/2021 FINDINGS: CT CHEST FINDINGS Cardiovascular: Heart size is normal. Aortic atherosclerosis. Coronary artery calcifications. No pericardial effusion. Mediastinum/Nodes: Asymmetric heterogeneous enlargement of the left lobe of thyroid gland. This has been evaluated on previous imaging. (ref: J Am Coll Radiol. 2015 Feb;12(2): 143-50). Trachea appears patent and midline. Normal appearance of the esophagus. No mediastinal or axillary adenopathy. Hilar structures are suboptimally evaluated due to lack of IV contrast. Lungs/Pleura: Central airways appear patent. No pleural effusion or airspace consolidation. Mild to moderate emphysema. Status post left lower lobectomy. Scattered lung nodules are identified, including: -within the periphery of the right lower lobe there is an irregular nodule measuring 1 cm, image 63/4. Previously this measured 0.9 cm. -Sub solid nodule in the posterior left lower lung measures 0.8 cm, image 93/4. Formally 0.6 cm. -Subpleural nodular density over the posterolateral left lower lung measures 1.2 x 0.5 cm, image 95/4. Previously this measured the same. Musculoskeletal: Status post ACDF within the cervical spine. No acute or suspicious bone lesions. No chest wall mass. CT ABDOMEN PELVIS FINDINGS Hepatobiliary: There are 2 small low-attenuation foci within the left lobe of liver and posteromedial right lobe of liver measuring up to 8 mm. These are technically too small to reliably characterize but appear unchanged. Gallbladder is unremarkable. Pancreas: Unremarkable. No pancreatic ductal dilatation or surrounding inflammatory changes. Spleen: Normal in size without focal abnormality. Adrenals/Urinary Tract: Normal appearance of the adrenal glands. No nephrolithiasis or hydronephrosis. Bilateral simple appearing kidney cysts are  noted. The largest is in the lateral cortex of the right kidney measuring 3.6 cm, image 65/3. No follow-up imaging recommended. No hydronephrosis identified bilaterally. Urinary bladder appears normal. Stomach/Bowel: Small hiatal hernia. Stomach is nondistended. The appendix is visualized and appears normal. No pathologic dilatation of the large or small bowel loops. Distal colonic diverticula noted without signs of acute diverticulitis. Vascular/Lymphatic: Aortic atherosclerosis. No aneurysm. No signs of abdominopelvic adenopathy. Reproductive: Partially calcified fibroid is noted anteriorly measuring 1.9 cm. No adnexal mass identified. Other: No ascites or focal fluid collections. No signs of peritoneal nodularity or mass. Musculoskeletal: Previous PLIF at L4-5. Degenerative disc disease is noted at the remaining lumbar spine levels. Bilateral facet arthropathy. No acute or suspicious osseous findings. IMPRESSION: 1. Several bilateral lung nodules are again noted. There has been no significant change in the size of these nodules (within 1-2 mm). No new pulmonary lesions identified when compared with previous exam. 2. No evidence for metastatic disease to the abdomen or pelvis. 3. Coronary artery calcifications. 4. Aortic Atherosclerosis (ICD10-I70.0) and Emphysema (ICD10-J43.9). Electronically Signed   By: Signa Kell M.D.   On: 05/06/2022 13:56   MR BRAIN WO CONTRAST  Result Date: 05/06/2022 CLINICAL DATA:  Memory impairment.  Recent headaches and imbalance EXAM: MRI HEAD WITHOUT CONTRAST TECHNIQUE: Multiplanar, multiecho pulse sequences of the brain and surrounding structures were obtained without intravenous contrast. COMPARISON:  10/16/2005 FINDINGS: Brain: Isointense mass with hemorrhagic features (hemosiderin at its inferior periphery) measuring up to 2.7 cm. Moderate vasogenic edema in the mass causes effacement of the lower fourth ventricle with third and lateral ventriculomegaly. Mild  periventricular FLAIR hyperintensity at the lateral ventricles which is more likely chronic small vessel ischemia than transependymal flow given incomplete appearance in the degree of ventriculomegaly. Gliosis and volume loss at the anterior left temporal  lobe which was also seen on prior, possibly posttraumatic in this location. Case discussed with the patient's daughter who is driving the patient and ER referral has been made. [facility] made aware. Vascular: Major flow voids are preserved Skull and upper cervical spine: Normal marrow signal. Sinuses/Orbits: No significant finding. Other: Small proteinaceous cystic intensity anterior to the hyoid, consistent with thyroglossal duct cyst measuring 1 cm. IMPRESSION: Nearly 3 cm hemorrhagic mass with vasogenic edema in the lower cerebellum. There is effacement of the fourth ventricle with ventriculomegaly. Presumed metastasis in this patient with history malignancy, recommend postcontrast assessment. Electronically Signed   By: Tiburcio Pea M.D.   On: 05/06/2022 09:48      Assessment/Plan Malignant neoplasm metastatic to brain  Boyce Medici Troyer presents with clinical and radiographic syndrome localizing to the posterior fossa.  Etiology is likely metastasis secondary to primary NSCLC.  The timing, growth rate of the lesion are unclear.  3T MRI will need to be obtained for more complete characterization and treatment planning.    We discussed and recommended radiosurgery, based on recommendations made following discussion this morning in CNS tumor board.    They are agreeable with [facility].  Once 3T study is complete, will meet with radiation team for simulation.    Because of lack of symptoms currently, will hold off on corticosteroids.  If headaches recur or dystaxia develops, will start low dose decadron.  Still needs to meet with Dr. Bertis Ruddy to review recent restaging studies.    We spent twenty additional minutes teaching regarding the natural  history, biology, and historical experience in the treatment of neurologic complications of cancer.   We appreciate the opportunity to participate in the care of EVAMAE ROWEN.  We ask that SHYASIA FUNCHES return to clinic in 3 months following next brain MRI, or sooner as needed.  All questions were answered. The patient knows to call the clinic with any problems, questions or concerns. No barriers to learning were detected.  The total time spent in the encounter was 40 minutes and more than 50% was on counseling and review of test results   Henreitta Leber, MD Medical Director of Neuro-Oncology [facility] at [place] 05/08/22 2:41 PM

## 2022-05-08 NOTE — Telephone Encounter (Signed)
Called daughter back. Scheduled appt at 2:45 pm today and they are aware of appt.

## 2022-05-08 NOTE — Progress Notes (Signed)
[facility] FOLLOW-UP progress notes  Patient Care Team: Georgina Quint, MD as PCP - General (Internal Medicine) Artis Delay, MD as Consulting Physician (Hematology and Oncology) Delight Ovens, MD (Inactive) as Consulting Physician (Cardiothoracic Surgery) Gelene Mink, OD as Referring Physician (Optometry) Gwynneth Munson, Corrie Dandy (Neurology)  CHIEF COMPLAINTS/PURPOSE OF VISIT:  Metastatic cancer to the brain  HISTORY OF PRESENTING ILLNESS:  [name] 87 y.o. female is seen urgently due to recent findings of metastatic cancer to the brain I have not seen the patient for over 3 years because she was deemed to be cured. I first saw her in 2015; in the course of evaluation for benign hematology reason, she was found to have new lung mass and subsequent diagnosis of lung cancer.  She had surgery followed by adjuvant chemotherapy She has been doing well until several months ago, started to have intermittent headaches, nausea and balance difficulties She never had syncopal episode of focal neurological deficit She also have intermittent changes in her memory Ultimately, she was sent to the emergency department for urgent evaluation last week CT imaging showed no definitive signs of cancer but MRI came back abnormal.  The patient was recommended admission but declined Today, she saw neuro oncologist and myself for further evaluation She denies recent cough, chest pain or shortness of breath She is up to date with her screening  She denies any recent abnormal breast examination, palpable mass, abnormal breast appearance or nipple changes  I reviewed the patient's records extensive and collaborated the history with the patient. Summary of her history is as follows: Oncology History Overview Note  Non-small cell lung cancer, adenocarcinoma   Primary site: Lung (Left)   Staging method: AJCC 7th Edition   Clinical: Stage IB (T2a, N0, M0) signed by Artis Delay, MD on  04/29/2013  9:01 PM   Pathologic: Stage IB (T2a, N0, cM0) signed by Delight Ovens, MD on 04/09/2013 12:51 PM   Summary: Stage IB (T2a, N0, cM0)     History of lung cancer  02/26/2013 Imaging   CXR for evaluation of cough showed new left lung mass   02/28/2013 Imaging   CT chest showed left new lung nodule   03/14/2013 Imaging   PET/CT scan showed localized disease in the left lung   04/07/2013 Surgery   She underwent bronchoscopy and left lower lobe resection and lymph node sampling with negative margins   05/06/2013 Procedure   The patient has placement of Infuse-a-Port   05/21/2013 - 07/30/2013 Chemotherapy   She completed 4 cycles of adjuvant chemotherapy with navelbine and cisplatin   07/01/2013 Imaging   CT scan of the chest shows stable appearance.   07/02/2013 Adverse Reaction   Dose of chemotherapy was adjusted further due to severe anemia.   12/31/2013 Imaging   CT scan of the chest show no evidence of disease recurrence   07/03/2014 Imaging   Chest x-ray looks normal   12/31/2014 Imaging   Ct chest showed no evidence of recurrence   05/31/2015 Imaging   Status post resection of a left lower lobe lung lesion without findings for recurrent tumor or metastatic disease.    05/29/2016 Imaging   1. Stable exam. Status post left lower lobectomy without evidence for new or progressive findings. 2. Emphysema. 3. Thoracic aortic atherosclerosis   05/30/2017 Imaging   1. Slight interval increase in size of right upper lobe nodule. Recommend attention on follow-up. 2. Stable appearance of the left hemithorax with left  lower lobectomy. 3. Aortic Atherosclerosis (ICD10-I70.0) and Emphysema (ICD10-J43.9).   11/13/2018 Imaging   1. No evidence of recurrent or metastatic disease. 2. Clustered peribronchovascular nodularity in the right upper lobe appears stable. 3. 7 mm left upper lobe ground-glass nodule, stable. Continued attention on follow-up exams is warranted. 4. Aortic  atherosclerosis (ICD10-170.0). Coronary artery calcification. 5.  Emphysema (ICD10-J43.9).     MEDICAL HISTORY:  Past Medical History:  Diagnosis Date   Arthritis    Benign hypertensive kidney disease with chronic kidney disease stage I through stage IV, or unspecified(403.10)    sees dr. Hyman Hopes again in april   Cancer    lung ca dx'd 02/2013   Chronic kidney disease, stage III (moderate)    Cough 02/26/2013   GERD (gastroesophageal reflux disease)    Headache    Hypertension    Hyperthyroidism    took iodine treatment for this   Lung mass 02/27/2013   Neurologic disorder    2009   Shortness of breath    exertion   Thrombocytopenia, unspecified 02/26/2013   Unspecified vitamin D deficiency     SURGICAL HISTORY: Past Surgical History:  Procedure Laterality Date   BACK SURGERY     EYE SURGERY     cataract   EYE SURGERY Left    open up tear duct   LYMPH NODE DISSECTION Left 04/07/2013   Procedure: LYMPH NODE DISSECTION;  Surgeon: Delight Ovens, MD;  Location: [facility] OR;  Service: Thoracic;  Laterality: Left;   NECK SURGERY     PORT-A-CATH REMOVAL Left 01/09/2014   Procedure: REMOVAL PORT-A-CATH;  Surgeon: Delight Ovens, MD;  Location: [facility] OR;  Service: Thoracic;  Laterality: Left;   PORTACATH PLACEMENT Left 05/06/2013   Procedure: INSERTION PORT-A-CATH;  Surgeon: Delight Ovens, MD;  Location: [facility] OR;  Service: Thoracic;  Laterality: Left;   VIDEO ASSISTED THORACOSCOPY (VATS)/ LOBECTOMY Left 04/07/2013   Procedure: VIDEO ASSISTED THORACOSCOPY (VATS)/ LOBECTOMY; INSERTION OF ON-Q PUMP;  Surgeon: Delight Ovens, MD;  Location: [facility] OR;  Service: Thoracic;  Laterality: Left;   VIDEO BRONCHOSCOPY N/A 04/07/2013   Procedure: VIDEO BRONCHOSCOPY;  Surgeon: Delight Ovens, MD;  Location: [facility] OR;  Service: Thoracic;  Laterality: N/A;    SOCIAL HISTORY: Social History   Socioeconomic History   Marital status: Single    Spouse name: Not on file   Number of children: 2   Years of  education: 4   Highest education level: Not on file  Occupational History   Not on file  Tobacco Use   Smoking status: Former    Packs/day: 0.50    Years: 60.00    Additional pack years: 0.00    Total pack years: 30.00    Types: Cigarettes    Quit date: 02/25/2012    Years since quitting: 10.2   Smokeless tobacco: Never  Vaping Use   Vaping Use: Never used  Substance and Sexual Activity   Alcohol use: No   Drug use: No   Sexual activity: Not on file  Other Topics Concern   Not on file  Social History Narrative   Right handed   Drinks caffeine   One story home   Lives alone   retired   Chief Executive Officer Determinants of Health   Financial Resource Strain: Low Risk  (11/11/2020)   Overall Financial Resource Strain (CARDIA)    Difficulty of Paying Living Expenses: Not hard at all  Food Insecurity: No Food Insecurity (05/08/2022)   Hunger Vital Sign  Worried About Programme researcher, broadcasting/film/video in the Last Year: Never true    Ran Out of Food in the Last Year: Never true  Transportation Needs: No Transportation Needs (05/08/2022)   PRAPARE - Administrator, Civil Service (Medical): No    Lack of Transportation (Non-Medical): No  Physical Activity: Sufficiently Active (11/11/2020)   Exercise Vital Sign    Days of Exercise per Week: 5 days    Minutes of Exercise per Session: 30 min  Stress: No Stress Concern Present (11/11/2020)   [employer] of Occupational Health - Occupational Stress Questionnaire    Feeling of Stress : Not at all  Social Connections: Moderately Integrated (11/11/2020)   Social Connection and Isolation Panel [NHANES]    Frequency of Communication with Friends and Family: More than three times a week    Frequency of Social Gatherings with Friends and Family: More than three times a week    Attends Religious Services: More than 4 times per year    Active Member of [employer] or Organizations: Yes    Attends Engineer, structural: More than 4 times per year     Marital Status: Never married  Intimate Partner Violence: Not At Risk (05/08/2022)   Humiliation, Afraid, Rape, and Kick questionnaire    Fear of Current or Ex-Partner: No    Emotionally Abused: No    Physically Abused: No    Sexually Abused: No    FAMILY HISTORY: Family History  Problem Relation Age of Onset   Breast cancer Mother    Lung cancer Father        smoked   Lung cancer Brother        smoked   Rectal cancer Neg Hx    Stomach cancer Neg Hx    Esophageal cancer Neg Hx    Colon cancer Neg Hx     ALLERGIES:  is allergic to aspirin, lactose intolerance (gi), adhesive [tape], lisinopril, and percocet [oxycodone-acetaminophen].  MEDICATIONS:  Current Outpatient Medications  Medication Sig Dispense Refill   LORazepam (ATIVAN) 0.5 MG tablet Take 1 tablet (0.5 mg total) by mouth 2 (two) times daily as needed for anxiety. 4 tablet 0   acetaminophen (TYLENOL) 500 MG tablet Take 1 tablet (500 mg total) by mouth every 6 (six) hours as needed. 30 tablet 0   atorvastatin (LIPITOR) 10 MG tablet TAKE 1 TABLET (10 MG TOTAL) BY MOUTH DAILY AT 6 PM. 90 tablet 3   chlorthalidone (HYGROTON) 25 MG tablet TAKE 1 TABLET BY MOUTH EVERY DAY 90 tablet 3   Cholecalciferol (VITAMIN D-3) 1000 units CAPS Take 1 capsule (1,000 Units total) by mouth daily. 90 capsule 1   clopidogrel (PLAVIX) 75 MG tablet TAKE 1 TABLET BY MOUTH EVERY DAY WITH BREAKFAST 90 tablet 2   gabapentin (NEURONTIN) 300 MG capsule Take 300 mg by mouth daily.     magnesium oxide (MAG-OX) 400 MG tablet Take 1 tablet (400 mg total) by mouth daily. 90 tablet 1   methocarbamol (ROBAXIN) 500 MG tablet TAKE 1 TABLET BY MOUTH AT BEDTIME AS NEEDED FOR MUSCLE SPASMS. 30 tablet 2   metoprolol succinate (TOPROL-XL) 25 MG 24 hr tablet TAKE 1 TABLET (25 MG) BY ORAL ROUTE TWICE A DAY 180 tablet 3   Multiple Vitamin (MULTIVITAMIN) tablet Take 1 tablet by mouth at bedtime.      telmisartan (MICARDIS) 40 MG tablet TAKE 1 TABLET BY MOUTH EVERY  DAY 90 tablet 3   No current facility-administered medications for this visit.  REVIEW OF SYSTEMS:   Constitutional: Denies fevers, chills or abnormal night sweats Eyes: Denies blurriness of vision, double vision or watery eyes Ears, nose, mouth, throat, and face: Denies mucositis or sore throat Respiratory: Denies cough, dyspnea or wheezes Cardiovascular: Denies palpitation, chest discomfort or lower extremity swelling Gastrointestinal:  Denies nausea, heartburn or change in bowel habits Skin: Denies abnormal skin rashes Lymphatics: Denies new lymphadenopathy or easy bruising Behavioral/Psych: Mood is stable, no new changes  All other systems were reviewed with the patient and are negative.  PHYSICAL EXAMINATION: ECOG PERFORMANCE STATUS: 1 - Symptomatic but completely ambulatory  Vitals:   05/08/22 1443  BP: (!) 148/55  Pulse: (!) 54  Resp: 18  Temp: (!) 97.4 F (36.3 C)  SpO2: 100%   Filed Weights   05/08/22 1443  Weight: 138 lb 3.2 oz (62.7 kg)    GENERAL:alert, no distress and comfortable SKIN: skin color, texture, turgor are normal, no rashes or significant lesions EYES: normal, conjunctiva are pink and non-injected, sclera clear OROPHARYNX:no exudate, normal lips, buccal mucosa, and tongue  NECK: supple, thyroid normal size, non-tender, without nodularity LYMPH:  no palpable lymphadenopathy in the cervical, axillary or inguinal LUNGS: clear to auscultation and percussion with normal breathing effort HEART: regular rate & rhythm and no murmurs without lower extremity edema ABDOMEN:abdomen soft, non-tender and normal bowel sounds Musculoskeletal:no cyanosis of digits and no clubbing  PSYCH: alert & oriented x 3 with fluent speech NEURO: no focal motor/sensory deficits  LABORATORY DATA:  I have reviewed the data as listed Lab Results  Component Value Date   WBC 7.5 05/06/2022   HGB 11.7 (L) 05/06/2022   HCT 36.4 05/06/2022   MCV 95.0 05/06/2022   PLT 250  05/06/2022   Recent Labs    05/23/21 1336 05/06/22 1126  NA 141 140  K 4.1 3.9  CL 106 106  CO2 28 21*  GLUCOSE 89 88  BUN 26* 24*  CREATININE 1.81* 2.00*  CALCIUM 9.9 9.7  GFRNONAA  --  24*  PROT 7.3  --   ALBUMIN 4.2  --   AST 24  --   ALT 17  --   ALKPHOS 64  --   BILITOT 0.5  --     RADIOGRAPHIC STUDIES: I have reviewed imaging study with the patient and her daughter I have personally reviewed the radiological images as listed and agreed with the findings in the report. MR BRAIN W CONTRAST  Result Date: 05/06/2022 CLINICAL DATA:  Brain metastases, unknown primary EXAM: MRI HEAD WITH CONTRAST TECHNIQUE: Multiplanar, multiecho pulse sequences of the brain and surrounding structures were obtained with intravenous contrast. CONTRAST:  6.67mL GADAVIST GADOBUTROL 1 MMOL/ML IV SOLN COMPARISON:  Same day MRI head. FINDINGS: Postcontrast only imaging was performed to further evaluate the abnormality seen on same day noncontrast MRI. The mass within the left cerebellum is enhancing, compatible with a metastasis given the clinical history. No other enhancing lesions identified. Please see same day MRI head for further characterization, including effacement of the fourth ventricle in ventriculomegaly. IMPRESSION: Postcontrast only imaging. The mass within the left cerebellum is enhancing, compatible with a metastasis given the clinical history. No other enhancing lesions identified. Electronically Signed   By: Feliberto Harts M.D.   On: 05/06/2022 15:17   CT Chest Wo Contrast  Result Date: 05/06/2022 CLINICAL DATA:  History of lung cancer. New hemorrhagic mass in the cerebellum. Restage lung cancer. * Tracking Code: BO * EXAM: CT CHEST, ABDOMEN AND PELVIS WITHOUT CONTRAST TECHNIQUE:  Multidetector CT imaging of the chest, abdomen and pelvis was performed following the standard protocol without IV contrast. RADIATION DOSE REDUCTION: This exam was performed according to the departmental  dose-optimization program which includes automated exposure control, adjustment of the mA and/or kV according to patient size and/or use of iterative reconstruction technique. COMPARISON:  CT chest from 11/28/2021 FINDINGS: CT CHEST FINDINGS Cardiovascular: Heart size is normal. Aortic atherosclerosis. Coronary artery calcifications. No pericardial effusion. Mediastinum/Nodes: Asymmetric heterogeneous enlargement of the left lobe of thyroid gland. This has been evaluated on previous imaging. (ref: J Am Coll Radiol. 2015 Feb;12(2): 143-50). Trachea appears patent and midline. Normal appearance of the esophagus. No mediastinal or axillary adenopathy. Hilar structures are suboptimally evaluated due to lack of IV contrast. Lungs/Pleura: Central airways appear patent. No pleural effusion or airspace consolidation. Mild to moderate emphysema. Status post left lower lobectomy. Scattered lung nodules are identified, including: -within the periphery of the right lower lobe there is an irregular nodule measuring 1 cm, image 63/4. Previously this measured 0.9 cm. -Sub solid nodule in the posterior left lower lung measures 0.8 cm, image 93/4. Formally 0.6 cm. -Subpleural nodular density over the posterolateral left lower lung measures 1.2 x 0.5 cm, image 95/4. Previously this measured the same. Musculoskeletal: Status post ACDF within the cervical spine. No acute or suspicious bone lesions. No chest wall mass. CT ABDOMEN PELVIS FINDINGS Hepatobiliary: There are 2 small low-attenuation foci within the left lobe of liver and posteromedial right lobe of liver measuring up to 8 mm. These are technically too small to reliably characterize but appear unchanged. Gallbladder is unremarkable. Pancreas: Unremarkable. No pancreatic ductal dilatation or surrounding inflammatory changes. Spleen: Normal in size without focal abnormality. Adrenals/Urinary Tract: Normal appearance of the adrenal glands. No nephrolithiasis or hydronephrosis.  Bilateral simple appearing kidney cysts are noted. The largest is in the lateral cortex of the right kidney measuring 3.6 cm, image 65/3. No follow-up imaging recommended. No hydronephrosis identified bilaterally. Urinary bladder appears normal. Stomach/Bowel: Small hiatal hernia. Stomach is nondistended. The appendix is visualized and appears normal. No pathologic dilatation of the large or small bowel loops. Distal colonic diverticula noted without signs of acute diverticulitis. Vascular/Lymphatic: Aortic atherosclerosis. No aneurysm. No signs of abdominopelvic adenopathy. Reproductive: Partially calcified fibroid is noted anteriorly measuring 1.9 cm. No adnexal mass identified. Other: No ascites or focal fluid collections. No signs of peritoneal nodularity or mass. Musculoskeletal: Previous PLIF at L4-5. Degenerative disc disease is noted at the remaining lumbar spine levels. Bilateral facet arthropathy. No acute or suspicious osseous findings. IMPRESSION: 1. Several bilateral lung nodules are again noted. There has been no significant change in the size of these nodules (within 1-2 mm). No new pulmonary lesions identified when compared with previous exam. 2. No evidence for metastatic disease to the abdomen or pelvis. 3. Coronary artery calcifications. 4. Aortic Atherosclerosis (ICD10-I70.0) and Emphysema (ICD10-J43.9). Electronically Signed   By: Signa Kell M.D.   On: 05/06/2022 13:56   CT ABDOMEN PELVIS WO CONTRAST  Result Date: 05/06/2022 CLINICAL DATA:  History of lung cancer. New hemorrhagic mass in the cerebellum. Restage lung cancer. * Tracking Code: BO * EXAM: CT CHEST, ABDOMEN AND PELVIS WITHOUT CONTRAST TECHNIQUE: Multidetector CT imaging of the chest, abdomen and pelvis was performed following the standard protocol without IV contrast. RADIATION DOSE REDUCTION: This exam was performed according to the departmental dose-optimization program which includes automated exposure control, adjustment  of the mA and/or kV according to patient size and/or use of iterative reconstruction technique.  COMPARISON:  CT chest from 11/28/2021 FINDINGS: CT CHEST FINDINGS Cardiovascular: Heart size is normal. Aortic atherosclerosis. Coronary artery calcifications. No pericardial effusion. Mediastinum/Nodes: Asymmetric heterogeneous enlargement of the left lobe of thyroid gland. This has been evaluated on previous imaging. (ref: J Am Coll Radiol. 2015 Feb;12(2): 143-50). Trachea appears patent and midline. Normal appearance of the esophagus. No mediastinal or axillary adenopathy. Hilar structures are suboptimally evaluated due to lack of IV contrast. Lungs/Pleura: Central airways appear patent. No pleural effusion or airspace consolidation. Mild to moderate emphysema. Status post left lower lobectomy. Scattered lung nodules are identified, including: -within the periphery of the right lower lobe there is an irregular nodule measuring 1 cm, image 63/4. Previously this measured 0.9 cm. -Sub solid nodule in the posterior left lower lung measures 0.8 cm, image 93/4. Formally 0.6 cm. -Subpleural nodular density over the posterolateral left lower lung measures 1.2 x 0.5 cm, image 95/4. Previously this measured the same. Musculoskeletal: Status post ACDF within the cervical spine. No acute or suspicious bone lesions. No chest wall mass. CT ABDOMEN PELVIS FINDINGS Hepatobiliary: There are 2 small low-attenuation foci within the left lobe of liver and posteromedial right lobe of liver measuring up to 8 mm. These are technically too small to reliably characterize but appear unchanged. Gallbladder is unremarkable. Pancreas: Unremarkable. No pancreatic ductal dilatation or surrounding inflammatory changes. Spleen: Normal in size without focal abnormality. Adrenals/Urinary Tract: Normal appearance of the adrenal glands. No nephrolithiasis or hydronephrosis. Bilateral simple appearing kidney cysts are noted. The largest is in the lateral  cortex of the right kidney measuring 3.6 cm, image 65/3. No follow-up imaging recommended. No hydronephrosis identified bilaterally. Urinary bladder appears normal. Stomach/Bowel: Small hiatal hernia. Stomach is nondistended. The appendix is visualized and appears normal. No pathologic dilatation of the large or small bowel loops. Distal colonic diverticula noted without signs of acute diverticulitis. Vascular/Lymphatic: Aortic atherosclerosis. No aneurysm. No signs of abdominopelvic adenopathy. Reproductive: Partially calcified fibroid is noted anteriorly measuring 1.9 cm. No adnexal mass identified. Other: No ascites or focal fluid collections. No signs of peritoneal nodularity or mass. Musculoskeletal: Previous PLIF at L4-5. Degenerative disc disease is noted at the remaining lumbar spine levels. Bilateral facet arthropathy. No acute or suspicious osseous findings. IMPRESSION: 1. Several bilateral lung nodules are again noted. There has been no significant change in the size of these nodules (within 1-2 mm). No new pulmonary lesions identified when compared with previous exam. 2. No evidence for metastatic disease to the abdomen or pelvis. 3. Coronary artery calcifications. 4. Aortic Atherosclerosis (ICD10-I70.0) and Emphysema (ICD10-J43.9). Electronically Signed   By: Signa Kell M.D.   On: 05/06/2022 13:56   MR BRAIN WO CONTRAST  Result Date: 05/06/2022 CLINICAL DATA:  Memory impairment.  Recent headaches and imbalance EXAM: MRI HEAD WITHOUT CONTRAST TECHNIQUE: Multiplanar, multiecho pulse sequences of the brain and surrounding structures were obtained without intravenous contrast. COMPARISON:  10/16/2005 FINDINGS: Brain: Isointense mass with hemorrhagic features (hemosiderin at its inferior periphery) measuring up to 2.7 cm. Moderate vasogenic edema in the mass causes effacement of the lower fourth ventricle with third and lateral ventriculomegaly. Mild periventricular FLAIR hyperintensity at the  lateral ventricles which is more likely chronic small vessel ischemia than transependymal flow given incomplete appearance in the degree of ventriculomegaly. Gliosis and volume loss at the anterior left temporal lobe which was also seen on prior, possibly posttraumatic in this location. Case discussed with the patient's daughter who is driving the patient and ER referral has been made. [facility]  ER made aware. Vascular: Major flow voids are preserved Skull and upper cervical spine: Normal marrow signal. Sinuses/Orbits: No significant finding. Other: Small proteinaceous cystic intensity anterior to the hyoid, consistent with thyroglossal duct cyst measuring 1 cm. IMPRESSION: Nearly 3 cm hemorrhagic mass with vasogenic edema in the lower cerebellum. There is effacement of the fourth ventricle with ventriculomegaly. Presumed metastasis in this patient with history malignancy, recommend postcontrast assessment. Electronically Signed   By: Tiburcio Pea M.D.   On: 05/06/2022 09:48    ASSESSMENT & PLAN:  History of lung cancer I have reviewed her recent CT imaging as well as MRI There is nothing definitive for source of brain metastasis I recommend PET/CT imaging for evaluation and she is in agreement I will see if we can add molecular testing to her previous surgical sample  Malignant neoplasm metastatic to brain She denies symptomatic headache or nausea today She has been evaluated by neuro oncologist with plan for treatment soon We discussed signs and symptoms to watch out for disease progression For now, I would defer to neuro oncologist and radiation team for treatment  Orders Placed This Encounter  Procedures   NM PET Image Restage (PS) Skull Base to Thigh (F-18 FDG)    Standing Status:   Future    Standing Expiration Date:   05/08/2023    Order Specific Question:   If indicated for the ordered procedure, I authorize the administration of a radiopharmaceutical per Radiology protocol    Answer:    Yes    Order Specific Question:   Preferred imaging location?    Answer:   [facility]    Order Specific Question:   Radiology Contrast Protocol - do NOT remove file path    Answer:   [email or web address]    All questions were answered. The patient knows to call the clinic with any problems, questions or concerns. The total time spent in the appointment was 60 minutes encounter with patients including review of chart and various tests results, discussions about plan of care and coordination of care plan   Artis Delay, MD 05/08/2022 3:40 PM

## 2022-05-09 ENCOUNTER — Other Ambulatory Visit: Payer: Self-pay | Admitting: Radiation Therapy

## 2022-05-09 ENCOUNTER — Telehealth: Payer: Self-pay

## 2022-05-09 DIAGNOSIS — C7931 Secondary malignant neoplasm of brain: Secondary | ICD-10-CM

## 2022-05-09 NOTE — Transitions of Care (Post Inpatient/ED Visit) (Signed)
   05/09/2022  Name: [name] MRN: [id number] DOB: December 30, 1935  Today's TOC FU Call Status: Today's TOC FU Call Status:: Unsuccessful Call (2nd Attempt) Unsuccessful Call (1st Attempt) Date: 05/08/22 Unsuccessful Call (2nd Attempt) Date: 05/09/22  Attempted to reach the patient regarding the most recent Inpatient/ED visit.  Follow Up Plan: Additional outreach attempts will be made to reach the patient to complete the Transitions of Care (Post Inpatient/ED visit) call.   Signature Karena Addison, LPN [facility] Nurse Health Advisor Direct Dial [phone]

## 2022-05-09 NOTE — Progress Notes (Signed)
Histology and Location of Primary Cancer: lung cancer  04-07-2013   Location(s) of Symptomatic tumor(s):   MRI on 05-06-22 IMPRESSION: Postcontrast only imaging. The mass within the left cerebellum is enhancing, compatible with a metastasis given the clinical history. No other enhancing lesions identified.  MR BRAIN WO CONTRAST 05-06-22 IMPRESSION: Nearly 3 cm hemorrhagic mass with vasogenic edema in the lower cerebellum. There is effacement of the fourth ventricle with ventriculomegaly. Presumed metastasis in this patient with history malignancy, recommend postcontrast assessment.  CT ABDOMEN PELVIS WO CONTRAST 05-06-22 IMPRESSION: 1. Several bilateral lung nodules are again noted. There has been no significant change in the size of these nodules (within 1-2 mm). No new pulmonary lesions identified when compared with previous exam. 2. No evidence for metastatic disease to the abdomen or pelvis. 3. Coronary artery calcifications. 4. Aortic Atherosclerosis (ICD10-I70.0) and Emphysema (ICD10-J43.9).    Past/Anticipated chemotherapy by medical oncology, if any:  Dr. Bertis Ruddy on 05-08-22 Oncology History Overview Note   Non-small cell lung cancer, adenocarcinoma   Primary site: Lung (Left)   Staging method: AJCC 7th Edition   Clinical: Stage IB (T2a, N0, M0) signed by Artis Delay, MD on 04/29/2013  9:01 PM   Pathologic: Stage IB (T2a, N0, cM0) signed by Delight Ovens, MD on 04/09/2013 12:51 PM   Summary: Stage IB (T2a, N0, cM0)        History of lung cancer   02/26/2013 Imaging     CXR for evaluation of cough showed new left lung mass     02/28/2013 Imaging     CT chest showed left new lung nodule     03/14/2013 Imaging     PET/CT scan showed localized disease in the left lung     04/07/2013 Surgery     She underwent bronchoscopy and left lower lobe resection and lymph node sampling with negative margins     05/06/2013 Procedure     The patient has placement of Infuse-a-Port      05/21/2013 - 07/30/2013 Chemotherapy     She completed 4 cycles of adjuvant chemotherapy with navelbine and cisplatin     07/01/2013 Imaging     CT scan of the chest shows stable appearance.     07/02/2013 Adverse Reaction     Dose of chemotherapy was adjusted further due to severe anemia.     12/31/2013 Imaging     CT scan of the chest show no evidence of disease recurrence     07/03/2014 Imaging     Chest x-ray looks normal     12/31/2014 Imaging     Ct chest showed no evidence of recurrence     05/31/2015 Imaging     Status post resection of a left lower lobe lung lesion without findings for recurrent tumor or metastatic disease.      05/29/2016 Imaging     1. Stable exam. Status post left lower lobectomy without evidence for new or progressive findings. 2. Emphysema. 3. Thoracic aortic atherosclerosis     05/30/2017 Imaging     1. Slight interval increase in size of right upper lobe nodule. Recommend attention on follow-up. 2. Stable appearance of the left hemithorax with left lower lobectomy. 3. Aortic Atherosclerosis (ICD10-I70.0) and Emphysema (ICD10-J43.9).     11/13/2018 Imaging     1. No evidence of recurrent or metastatic disease. 2. Clustered peribronchovascular nodularity in the right upper lobe appears stable. 3. 7 mm left upper lobe ground-glass nodule, stable. Continued attention on follow-up exams is warranted.  4. Aortic atherosclerosis (ICD10-170.0). Coronary artery calcification. 5.  Emphysema (ICD10-J43.9).    ASSESSMENT & PLAN:  History of lung cancer I have reviewed her recent CT imaging as well as MRI There is nothing definitive for source of brain metastasis I recommend PET/CT imaging for evaluation and she is in agreement I will see if we can add molecular testing to her previous surgical sample   Malignant neoplasm metastatic to brain She denies symptomatic headache or nausea today She has been evaluated by neuro oncologist with plan for treatment soon We  discussed signs and symptoms to watch out for disease progression For now, I would defer to neuro oncologist and radiation team for treatment  Dr. Barbaraann Cao on 05-08-22 History of Present Illness: The patient's records from the referring physician were obtained and reviewed and the patient interviewed to confirm this HPI.  [name] presents today to review recent MRI, CNS complaints.  She describes several weeks history of headaches, new from prior.  She attributes them to chemicals sprayed in her house for fleas and bugs, noting that the headache actually resolved once the spray was discontinued last week.  PCP ordered a brain MRI, which demonstrated an enhancing mass in the posterior fossa.  She denies any issues with balance, gait is independent.  No other systemic complains, hasn't followed up regarding history of lung cancer since 2020.  Assessment/Plan Malignant neoplasm metastatic to brain   [name] presents with clinical and radiographic syndrome localizing to the posterior fossa.  Etiology is likely metastasis secondary to primary NSCLC.  The timing, growth rate of the lesion are unclear.  3T MRI will need to be obtained for more complete characterization and treatment planning.     We discussed and recommended radiosurgery, based on recommendations made following discussion this morning in CNS tumor board.     They are agreeable with SRS.  Once 3T study is complete, will meet with radiation team for simulation.     Because of lack of symptoms currently, will hold off on corticosteroids.  If headaches recur or dystaxia develops, will start low dose decadron.   Still needs to meet with Dr. Bertis Ruddy to review recent restaging studies.     We spent twenty additional minutes teaching regarding the natural history, biology, and historical experience in the treatment of neurologic complications of cancer.    We appreciate the opportunity to participate in the care of [name].   We ask that [name] return to clinic in 3 months following next brain MRI, or sooner as needed.  Patient's main complaints related to symptomatic tumor(s) are:  (Per Dr. Liana Gerold note on 05-08-22 She describes several weeks history of headaches, new from prior. She attributes them to chemicals sprayed in her house for fleas and bugs, noting that the headache actually resolved once the spray was discontinued last week. PCP ordered a brain MRI, which demonstrated an enhancing mass in the posterior fossa   Pain on a scale of 0-10 is: denies pain this am, she does report intermittent headaches to the front of her head   If Spine Met(s), symptoms, if any, include: Bowel/Bladder retention or incontinence (please describe): none to report Numbness or weakness in extremities (please describe): none Current Decadron regimen, if applicable: none  Ambulatory status? Walker? Wheelchair?: able to walk with no assistive devices  SAFETY ISSUES: Prior radiation? no Pacemaker/ICD? no Possible current pregnancy? no Is the patient on methotrexate? no  Additional Complaints / other details:  Going for MRI 05-17-22, PET scan on 05-18-22, pt was told to bring her anxiety medication with her to pre medicate for ct sim as well.

## 2022-05-09 NOTE — Telephone Encounter (Signed)
-----   Message from Artis Delay, MD sent at 05/09/2022  8:08 AM EDT ----- Pls remind daughter to call and schedule PET scan

## 2022-05-10 ENCOUNTER — Telehealth: Payer: Self-pay | Admitting: Radiation Therapy

## 2022-05-10 NOTE — Telephone Encounter (Signed)
Called to introduce myself and to inform pt of the radiation treatment planning appointments we have scheduled for her. I spoke with her daughter, [name]. She does not currently have access to MyChart, so she has written out her mother's upcoming appointments. [name] is claustrophobic, her medical oncologist has already written her a prescription for ativan to take as needed. I have instructed them to have her take this prior to her MRI and to also bring it with them the day of her simulation, 4/26.   [name] was thankful for the call and has my contact information in case she has other question or concerns.   [name] R.T.(R)(T) Radiation Special Procedures Navigator

## 2022-05-10 NOTE — Transitions of Care (Post Inpatient/ED Visit) (Signed)
   05/10/2022  Name: [name] MRN: [id number] DOB: 11/19/1935  Today's TOC FU Call Status: Today's TOC FU Call Status:: Unsuccessful Call (3rd Attempt) Unsuccessful Call (1st Attempt) Date: 05/08/22 Unsuccessful Call (2nd Attempt) Date: 05/09/22 Unsuccessful Call (3rd Attempt) Date: 05/10/22  Attempted to reach the patient regarding the most recent Inpatient/ED visit.  Follow Up Plan: No further outreach attempts will be made at this time. We have been unable to contact the patient.  Signature Karena Addison, LPN [facility] Nurse Health Advisor Direct Dial [phone]

## 2022-05-12 ENCOUNTER — Telehealth: Payer: Self-pay

## 2022-05-12 NOTE — Telephone Encounter (Signed)
-----   Message from Artis Delay, MD sent at 05/12/2022  9:07 AM EDT ----- I can see her next Friday at 1240 to review PET scan with her, please schedule

## 2022-05-12 NOTE — Telephone Encounter (Signed)
Called and scheduled appt with daughter for 4/26 at 1240. She is aware of appt time/date.

## 2022-05-16 ENCOUNTER — Telehealth: Payer: Self-pay | Admitting: Internal Medicine

## 2022-05-16 NOTE — Telephone Encounter (Signed)
Called patient regarding upcoming May appointment, patient is notified. ?

## 2022-05-17 ENCOUNTER — Telehealth: Payer: Self-pay

## 2022-05-17 ENCOUNTER — Ambulatory Visit
Admission: RE | Admit: 2022-05-17 | Discharge: 2022-05-17 | Disposition: A | Discharge status: Home / Self Care | Payer: 59 | Source: Ambulatory Visit | Attending: Internal Medicine | Admitting: Internal Medicine

## 2022-05-17 ENCOUNTER — Inpatient Hospital Stay: Payer: 59 | Admitting: Licensed Clinical Social Worker

## 2022-05-17 ENCOUNTER — Encounter: Payer: Self-pay | Admitting: Radiation Oncology

## 2022-05-17 DIAGNOSIS — C7931 Secondary malignant neoplasm of brain: Secondary | ICD-10-CM

## 2022-05-17 MED ORDER — GADOPICLENOL 0.5 MMOL/ML IV SOLN
6.5000 mL | Freq: Once | INTRAVENOUS | Status: AC | PRN
  Administered 2022-05-17: 6.5 mL via INTRAVENOUS

## 2022-05-17 NOTE — Progress Notes (Signed)
[facility] Clinical Social Work  Initial Assessment   GENEA RHEAUME is a 87 y.o. year old female. CSW contacted caregiver by phone (daughterAlvino Chapel). Clinical Social Work was referred by new patient protocol for assessment of psychosocial needs.   SDOH (Social Determinants of Health) assessments performed: Yes SDOH Interventions    Flowsheet Row Office Visit from 05/08/2022 in [facility]  SDOH Interventions   Food Insecurity Interventions Intervention Not Indicated  Housing Interventions Intervention Not Indicated  Utilities Interventions Intervention Not Indicated       SDOH Screenings   Food Insecurity: No Food Insecurity (05/08/2022)  Housing: Low Risk  (05/08/2022)  Transportation Needs: No Transportation Needs (05/08/2022)  Utilities: Not At Risk (05/08/2022)  Alcohol Screen: Low Risk  (11/11/2020)  Depression (PHQ2-9): Low Risk  (05/08/2022)  Financial Resource Strain: Low Risk  (11/11/2020)  Physical Activity: Sufficiently Active (11/11/2020)  Social Connections: Moderately Integrated (11/11/2020)  Stress: No Stress Concern Present (11/11/2020)  Tobacco Use: Medium Risk (05/08/2022)     Distress Screen completed: No     No data to display            Family/Social Information:  Housing Arrangement: patient lives alone in her own home. Daughter is nearby and has offered to move in to help if pt needs. Family members/support persons in your life? Family Transportation concerns: yes, pt no longer allowed to drive. Family is helping, but may be an issue if they are not able to make it one day  Employment: Retired.  Income source: Actor concerns: No Type of concern: None Food access concerns: no Services Currently in place:  [facility] Medicare & Medicaid  Coping/ Adjustment to diagnosis: Patient/family understands treatment plan and what happens next? Daughter expressed understanding of the plan. It is difficult  for her to see her mom going through this again, especially with the memory issues that are coming along with the metastases Concerns about diagnosis and/or treatment: rides to appts; impact on pt Current coping skills/ strengths: Supportive family/friends     SUMMARY: Current SDOH Barriers:  Transportation  Clinical Social Work Clinical Goal(s):  Pt's daughter will assist pt in utilizing transportation benefit through insurance for rides to appts if needed  Interventions: Discussed common feeling and emotions when being diagnosed with cancer, and the importance of support during treatment Informed pt's daughter of the support team roles and support services at [facility] Provided CSW contact information and encouraged pt/family to call with any questions or concerns Provided information on transportation benefit through insurance and how to contact   Follow Up Plan: Family/Patient will contact CSW with any support or resource needs Patient/family verbalizes understanding of plan: Yes    Elex Mainwaring E Kaiyden Simkin, LCSW

## 2022-05-17 NOTE — Telephone Encounter (Signed)
Rn called pt to obtain meaningful use and to complete consult note for pt consult on 05-19-22 with Dr. Basilio Cairo. Pt is doing well overall with no major concerns.

## 2022-05-18 ENCOUNTER — Telehealth: Payer: Self-pay | Admitting: Internal Medicine

## 2022-05-18 ENCOUNTER — Ambulatory Visit: Payer: 59 | Admitting: Radiation Oncology

## 2022-05-18 ENCOUNTER — Institutional Professional Consult (permissible substitution): Payer: 59 | Admitting: Radiation Oncology

## 2022-05-18 ENCOUNTER — Ambulatory Visit: Payer: 59

## 2022-05-18 ENCOUNTER — Encounter (HOSPITAL_COMMUNITY)
Admission: RE | Admit: 2022-05-18 | Discharge: 2022-05-18 | Disposition: A | Discharge status: Home / Self Care | Payer: 59 | Source: Ambulatory Visit | Attending: Hematology and Oncology | Admitting: Hematology and Oncology

## 2022-05-18 DIAGNOSIS — C7931 Secondary malignant neoplasm of brain: Secondary | ICD-10-CM | POA: Diagnosis not present

## 2022-05-18 LAB — GLUCOSE, CAPILLARY: Glucose-Capillary: 98 mg/dL (ref 70–99)

## 2022-05-18 MED ORDER — FLUDEOXYGLUCOSE F - 18 (FDG) INJECTION
6.8000 | Freq: Once | INTRAVENOUS | Status: AC
  Administered 2022-05-18: 6.8 via INTRAVENOUS

## 2022-05-18 NOTE — Progress Notes (Signed)
Radiation Oncology         [phone] ________________________________  Initial Outpatient Consultation  Name: [name] MRN: [id number]  Date: 05/19/2022  DOB: 1935-12-02  WU:[id number], Eilleen Kempf, MD  Barbaraann Cao, Georgeanna Lea, MD   REFERRING PHYSICIAN: Henreitta Leber, MD  DIAGNOSIS:     ICD-10-CM   1. Malignant neoplasm metastatic to brain (HCC)  C79.31 dexamethasone (DECADRON) 4 MG tablet    2. Metastasis to brain [facility])  C79.31        HISTORY OF PRESENT ILLNESS::[name] is a 87 y.o. female who presents today for consideration of brain SRS in management of her recently diagnosed brain metastasis from a left lung cancer primary.   In March of 2015 the patient was diagnosed with non-small cell left lung cancer, adenocarcinoma. She originally presented to medical attention in Febrary 2015 with cough and has a chest x-ray performed on 02/26/2013 which showed a new left lung mass. This prompted a chest CT on 02/28/13 which redemonstrated a new left lung nodule. PET on 03/15/23 showed no evidence of metastatic disease, and the patient underwent bronchoscopy with left lower lobe resection and lymph node sampling on 04/07/13. Pathology from the procedure (LLL resection) showed poorly differentiated invasive adenocarcinoma with pleural involvement and negative margins. She went on to receive 4 cycles of adjuvant chemotherapy consisting of navelbine and cisplatin from 05/21/2013 - 07/30/2013 under the care of Dr. Bertis Ruddy. She continued under surveillance after completing chemotherapy. She remained NED on subsequent follow-up imaging studies. She did have a follow-up chest CT on 05/30/2017 which showed a very slight interval increase in size of the right upper lobe nodule. Dr. Bertis Ruddy reviewed her CT and recommended continuing with annual imaging given that the nodule appeared overall similar on repeat evaluation, and given that the patient was asymptomatic at that time. She has since remained  without evidence of recurrent disease in the lungs.   This past February, the patient's daughter began noticing instances of memory loss in the patient. She brought this to the attention of her PCP who referred her [facility] on 04/12/22 for further management. Her changes in memory were thought to be in part attributed to her advanced age, and the patient was counseled on life style changes that can help preserve her memory.   However, soon after this visit, the patient developed persistent headaches which prompted an MRI of the brain on 05/06/22 which unfortunately demonstrated a nearly 3 cm hemorrhagic mass with vasogenic edema in the lower cerebellum, with effacement of the fourth ventricle with ventriculomegaly. Findings were/are presumed to represent metastatic disease in light of the patient's history.   The patient was subsequently instructed to present to the ED that same day for further evaluation. In the ED, the patient also reported having episodes of intermittent vomiting for the past month or so (in addition to her memory issues and recent headaches). She denied any other symptoms and was noted to be otherwise fully independent in her ADL's. Repeat MRI of the brain performed in the ED redemonstrated the enhancing mass in the left cerebellum concerning for metastatic disease  CT CAP performed in the ED on 04/13 again showed stability of the bilateral lung nodules and no new or progressive findings concerning for disease recurrence or metastatic disease in the chest, abdomen, or pelvis.   Accordingly, the patient was referred to Dr. Barbaraann Cao on 05/08/22 for further management. Her case was presented at the CNS tumor board earlier that day by Dr. Barbaraann Cao.  Recommendation concluded is for [facility] which we will discuss in detail today. Given her current lack of symptoms, Dr. Barbaraann Cao would like to hold off on any corticosteroids. If her headaches recur or dystaxia develops, he may consider starting  her on low dose decadron. She will continue to follow-up with Dr. Bertis Ruddy for repeat imaging studies.   Pertinent imaging performed thus far includes a 3T brain MRI on 05/17/22 which demonstrates no interval change in the left cerebral hemisphere mass measuring 2.7 cm, with surrounding vasogenic edema. No new or enhancing lesions are appreciated.   The patient also had a restaging PET scan performed yesterday. Results are pending at this time.   She and her daughter report that recently she has developed severe headaches and more balance issues.  She was not having these when she was seen by Dr. Barbaraann Cao She walks with a cane as needed at home but does not use it consistently.  PREVIOUS RADIATION THERAPY: No  PAST MEDICAL HISTORY:  has a past medical history of Arthritis, Benign hypertensive kidney disease with chronic kidney disease stage I through stage IV, or unspecified(403.10), Cancer (HCC), Chronic kidney disease, stage III (moderate) (HCC), Cough (02/26/2013), GERD (gastroesophageal reflux disease), Headache, Hypertension, Hyperthyroidism, Lung mass (02/27/2013), Neurologic disorder, Shortness of breath, Thrombocytopenia, unspecified (HCC) (02/26/2013), and Unspecified vitamin D deficiency.    PAST SURGICAL HISTORY: Past Surgical History:  Procedure Laterality Date   BACK SURGERY     EYE SURGERY     cataract   EYE SURGERY Left    open up tear duct   LYMPH NODE DISSECTION Left 04/07/2013   Procedure: LYMPH NODE DISSECTION;  Surgeon: Delight Ovens, MD;  Location: [facility] OR;  Service: Thoracic;  Laterality: Left;   NECK SURGERY     PORT-A-CATH REMOVAL Left 01/09/2014   Procedure: REMOVAL PORT-A-CATH;  Surgeon: Delight Ovens, MD;  Location: [facility] OR;  Service: Thoracic;  Laterality: Left;   PORTACATH PLACEMENT Left 05/06/2013   Procedure: INSERTION PORT-A-CATH;  Surgeon: Delight Ovens, MD;  Location: [facility] OR;  Service: Thoracic;  Laterality: Left;   VIDEO ASSISTED THORACOSCOPY (VATS)/  LOBECTOMY Left 04/07/2013   Procedure: VIDEO ASSISTED THORACOSCOPY (VATS)/ LOBECTOMY; INSERTION OF ON-Q PUMP;  Surgeon: Delight Ovens, MD;  Location: [facility] OR;  Service: Thoracic;  Laterality: Left;   VIDEO BRONCHOSCOPY N/A 04/07/2013   Procedure: VIDEO BRONCHOSCOPY;  Surgeon: Delight Ovens, MD;  Location: [facility] OR;  Service: Thoracic;  Laterality: N/A;    FAMILY HISTORY: family history includes Breast cancer in her mother; Lung cancer in her brother and father.  SOCIAL HISTORY:  reports that she quit smoking about 10 years ago. Her smoking use included cigarettes. She has a 30.00 pack-year smoking history. She has never used smokeless tobacco. She reports that she does not drink alcohol and does not use drugs.  ALLERGIES: Aspirin, Lactose intolerance (gi), Adhesive [tape], Lisinopril, and Percocet [oxycodone-acetaminophen]  MEDICATIONS:  Current Outpatient Medications  Medication Sig Dispense Refill   acetaminophen (TYLENOL) 500 MG tablet Take 1 tablet (500 mg total) by mouth every 6 (six) hours as needed. 30 tablet 0   atorvastatin (LIPITOR) 10 MG tablet TAKE 1 TABLET (10 MG TOTAL) BY MOUTH DAILY AT 6 PM. 90 tablet 3   chlorthalidone (HYGROTON) 25 MG tablet TAKE 1 TABLET BY MOUTH EVERY DAY 90 tablet 3   Cholecalciferol (VITAMIN D-3) 1000 units CAPS Take 1 capsule (1,000 Units total) by mouth daily. 90 capsule 1   clopidogrel (PLAVIX) 75 MG tablet TAKE 1  TABLET BY MOUTH EVERY DAY WITH BREAKFAST 90 tablet 2   dexamethasone (DECADRON) 4 MG tablet Take with food. Take 1 tablet TID initially. Then, on 5/13, taper to 1 tablet BID. On 5/27, taper to 1 tablet daily. On 6/17 taper to 1/2 tablet daily. Last dose: 7/5. 45 tablet 1   gabapentin (NEURONTIN) 300 MG capsule Take 300 mg by mouth daily.     LORazepam (ATIVAN) 0.5 MG tablet Take 1 tablet (0.5 mg total) by mouth 2 (two) times daily as needed for anxiety. 4 tablet 0   magnesium oxide (MAG-OX) 400 MG tablet Take 1 tablet (400 mg total) by mouth  daily. 90 tablet 1   methocarbamol (ROBAXIN) 500 MG tablet TAKE 1 TABLET BY MOUTH AT BEDTIME AS NEEDED FOR MUSCLE SPASMS. 30 tablet 2   metoprolol succinate (TOPROL-XL) 25 MG 24 hr tablet TAKE 1 TABLET (25 MG) BY ORAL ROUTE TWICE A DAY 180 tablet 3   Multiple Vitamin (MULTIVITAMIN) tablet Take 1 tablet by mouth at bedtime.      telmisartan (MICARDIS) 40 MG tablet TAKE 1 TABLET BY MOUTH EVERY DAY 90 tablet 3   No current facility-administered medications for this encounter.    REVIEW OF SYSTEMS:  As above.   PHYSICAL EXAM:  height is 5' (1.524 m). Her temperature is 97.6 F (36.4 C). Her blood pressure is 124/58 (abnormal) and her pulse is 58 (abnormal). Her respiration is 20 and oxygen saturation is 100%.   General: Alert and oriented, in no acute distress  HEENT: Head is normocephalic. Extraocular movements are intact. Oropharynx is clear.  Heart: Distant heart sounds Chest: Clear to auscultation bilaterally, with no rhonchi, wheezes, or rales. Abdomen: Soft, nontender, nondistended, with no rigidity or guarding. Extremities: No cyanosis or edema. Skin: No concerning lesions. Musculoskeletal: symmetric strength and muscle tone throughout. Neurologic: Cranial nerves II through XII are grossly intact. No obvious focalities. Speech is fluent.  Finger-to-nose testing is slow but grossly accurate.  No nystagmus. Psychiatric: Judgment and insight are intact. Affect is appropriate.  KPS = 70  100 - Normal; no complaints; no evidence of disease. 90   - Able to carry on normal activity; minor signs or symptoms of disease. 80   - Normal activity with effort; some signs or symptoms of disease. 54   - Cares for self; unable to carry on normal activity or to do active work. 60   - Requires occasional assistance, but is able to care for most of his personal needs. 50   - Requires considerable assistance and frequent medical care. 40   - Disabled; requires special care and assistance. 30   -  Severely disabled; hospital admission is indicated although death not imminent. 20   - Very sick; hospital admission necessary; active supportive treatment necessary. 10   - Moribund; fatal processes progressing rapidly. 0     - Dead  Karnofsky DA, Abelmann WH, Craver LS and Burchenal [facility] [phone]) The use of the nitrogen mustards in the palliative treatment of carcinoma: with particular reference to bronchogenic carcinoma Cancer 1 [id number]   LABORATORY DATA:  Lab Results  Component Value Date   WBC 7.5 05/06/2022   HGB 11.7 (L) 05/06/2022   HCT 36.4 05/06/2022   MCV 95.0 05/06/2022   PLT 250 05/06/2022   CMP     Component Value Date/Time   NA 140 05/06/2022 1126   NA 144 07/30/2018 1217   NA 144 05/29/2016 1119   K 3.9 05/06/2022 1126   K 4.4  05/29/2016 1119   CL 106 05/06/2022 1126   CO2 21 (L) 05/06/2022 1126   CO2 23 05/29/2016 1119   GLUCOSE 88 05/06/2022 1126   GLUCOSE 91 05/29/2016 1119   BUN 24 (H) 05/06/2022 1126   BUN 21 07/30/2018 1217   BUN 20.8 05/29/2016 1119   CREATININE 2.00 (H) 05/06/2022 1126   CREATININE 1.7 (H) 05/29/2016 1119   CALCIUM 9.7 05/06/2022 1126   CALCIUM 9.5 05/29/2016 1119   PROT 7.3 05/23/2021 1336   PROT 7.1 07/30/2018 1217   PROT 6.8 05/29/2016 1119   ALBUMIN 4.2 05/23/2021 1336   ALBUMIN 4.6 07/30/2018 1217   ALBUMIN 3.7 05/29/2016 1119   AST 24 05/23/2021 1336   AST 28 05/29/2016 1119   ALT 17 05/23/2021 1336   ALT 29 05/29/2016 1119   ALKPHOS 64 05/23/2021 1336   ALKPHOS 68 05/29/2016 1119   BILITOT 0.5 05/23/2021 1336   BILITOT 0.3 07/30/2018 1217   BILITOT 0.24 05/29/2016 1119   GFRNONAA 24 (L) 05/06/2022 1126   GFRAA 32 (L) 11/04/2018 1120         RADIOGRAPHY: NM PET Image Restage (PS) Skull Base to Thigh (F-18 FDG)  Result Date: 05/19/2022 CLINICAL DATA:  Subsequent treatment strategy for metastatic lung cancer. EXAM: NUCLEAR MEDICINE PET SKULL BASE TO THIGH TECHNIQUE: 6.84 mCi F-18 FDG was injected intravenously.  Full-ring PET imaging was performed from the skull base to thigh after the radiotracer. CT data was obtained and used for attenuation correction and anatomic localization. Fasting blood glucose: 98 mg/dl COMPARISON:  CT 40/98/1191 FINDINGS: Mediastinal blood pool activity: SUV max 3.67 Liver activity: SUV max NA NECK: Mass within the cerebellum noted on MRI from 05/17/2022 is tracer avid within SUV max of 26.70. Incidental CT findings: No tracer avid cervical lymph nodes. Nodule in the left lobe of thyroid gland containing calcifications measures 2.1 cm. No significant increased radiotracer uptake identified within this nodule. This has been evaluated on previous imaging. (ref: J Am Coll Radiol. 2015 Feb;12(2): 143-50). CHEST: No tracer avid mediastinal, hilar or axillary nodes. There are no tracer avid pulmonary nodules identified. Subsolid nodule in the posterior left lower lobe measures 7 mm without significant tracer uptake, image 38/7. This is technically too small to reliably characterize however low by PET-CT. The irregular nodule within the periphery of the right upper lobe measuring 1 cm is stable from the previous exam without significant tracer uptake, image 20/7. Focal nodular thickening over the posterior lateral left lower lobe measures approximately 1.1 by 0.7 cm without significant tracer uptake, image 79/4. Incidental CT findings: Emphysema.Aortic atherosclerosis coronary artery calcifications. ABDOMEN/PELVIS: No abnormal tracer uptake identified within the liver, pancreas, spleen, or adrenal glands. Incidental CT findings: Aortic atherosclerosis. Small hiatal hernia. 3.7 cm right kidney cyst. No follow-up imaging recommended. SKELETON: No focal hypermetabolic activity to suggest skeletal metastasis. Incidental CT findings: None. IMPRESSION: 1. The mass within the cerebellum noted on MRI from 05/17/2022 is tracer avid with SUV max of 26.70. 2. No signs of tracer avid nodal metastasis or solid organ  metastasis within the chest, abdomen or pelvis. 3. Stable small pulmonary nodules are identified without significant tracer uptake. 4. Coronary artery calcifications. 5.  Aortic Atherosclerosis (ICD10-I70.0). Electronically Signed   By: Signa Kell M.D.   On: 05/19/2022 09:28   MR Brain W Wo Contrast  Result Date: 05/18/2022 CLINICAL DATA:  Brain/CNS neoplasm, staging 3T SRS Protocol for radiation treatment planning. EXAM: MRI HEAD WITHOUT AND WITH CONTRAST TECHNIQUE: Multiplanar, multiecho pulse sequences  of the brain and surrounding structures were obtained without and with intravenous contrast. CONTRAST:  6.5 mL Vueway. COMPARISON:  MRI brain 05/06/2022. FINDINGS: Brain: Unchanged 27 x 22 mm enhancing mass with peripheral hemosiderin staining and surrounding vasogenic edema centered within the medial aspect of the left cerebellar hemisphere (image 44 series 13). No new enhancing lesions. Unchanged mass effect on the inferior fourth ventricle. Stable size and configuration of the third and lateral ventricles. No acute infarct or acute hemorrhage. Unchanged encephalomalacia in the left anterior temporal lobe, likely secondary to prior trauma. Vascular: Normal flow voids and enhancement. Skull and upper cervical spine: Normal marrow signal and enhancement. Sinuses/Orbits: Unremarkable. Other: None. IMPRESSION: 1. Unchanged 27 mm enhancing mass in the left cerebellar hemisphere with surrounding vasogenic edema. No new enhancing lesions. 2. Stable size and configuration of the third and lateral ventricles. Electronically Signed   By: Orvan Falconer M.D.   On: 05/18/2022 11:18   MR BRAIN W CONTRAST  Result Date: 05/06/2022 CLINICAL DATA:  Brain metastases, unknown primary EXAM: MRI HEAD WITH CONTRAST TECHNIQUE: Multiplanar, multiecho pulse sequences of the brain and surrounding structures were obtained with intravenous contrast. CONTRAST:  6.35mL GADAVIST GADOBUTROL 1 MMOL/ML IV SOLN COMPARISON:  Same day  MRI head. FINDINGS: Postcontrast only imaging was performed to further evaluate the abnormality seen on same day noncontrast MRI. The mass within the left cerebellum is enhancing, compatible with a metastasis given the clinical history. No other enhancing lesions identified. Please see same day MRI head for further characterization, including effacement of the fourth ventricle in ventriculomegaly. IMPRESSION: Postcontrast only imaging. The mass within the left cerebellum is enhancing, compatible with a metastasis given the clinical history. No other enhancing lesions identified. Electronically Signed   By: Feliberto Harts M.D.   On: 05/06/2022 15:17   CT Chest Wo Contrast  Result Date: 05/06/2022 CLINICAL DATA:  History of lung cancer. New hemorrhagic mass in the cerebellum. Restage lung cancer. * Tracking Code: BO * EXAM: CT CHEST, ABDOMEN AND PELVIS WITHOUT CONTRAST TECHNIQUE: Multidetector CT imaging of the chest, abdomen and pelvis was performed following the standard protocol without IV contrast. RADIATION DOSE REDUCTION: This exam was performed according to the departmental dose-optimization program which includes automated exposure control, adjustment of the mA and/or kV according to patient size and/or use of iterative reconstruction technique. COMPARISON:  CT chest from 11/28/2021 FINDINGS: CT CHEST FINDINGS Cardiovascular: Heart size is normal. Aortic atherosclerosis. Coronary artery calcifications. No pericardial effusion. Mediastinum/Nodes: Asymmetric heterogeneous enlargement of the left lobe of thyroid gland. This has been evaluated on previous imaging. (ref: J Am Coll Radiol. 2015 Feb;12(2): 143-50). Trachea appears patent and midline. Normal appearance of the esophagus. No mediastinal or axillary adenopathy. Hilar structures are suboptimally evaluated due to lack of IV contrast. Lungs/Pleura: Central airways appear patent. No pleural effusion or airspace consolidation. Mild to moderate  emphysema. Status post left lower lobectomy. Scattered lung nodules are identified, including: -within the periphery of the right lower lobe there is an irregular nodule measuring 1 cm, image 63/4. Previously this measured 0.9 cm. -Sub solid nodule in the posterior left lower lung measures 0.8 cm, image 93/4. Formally 0.6 cm. -Subpleural nodular density over the posterolateral left lower lung measures 1.2 x 0.5 cm, image 95/4. Previously this measured the same. Musculoskeletal: Status post ACDF within the cervical spine. No acute or suspicious bone lesions. No chest wall mass. CT ABDOMEN PELVIS FINDINGS Hepatobiliary: There are 2 small low-attenuation foci within the left lobe of liver and posteromedial  right lobe of liver measuring up to 8 mm. These are technically too small to reliably characterize but appear unchanged. Gallbladder is unremarkable. Pancreas: Unremarkable. No pancreatic ductal dilatation or surrounding inflammatory changes. Spleen: Normal in size without focal abnormality. Adrenals/Urinary Tract: Normal appearance of the adrenal glands. No nephrolithiasis or hydronephrosis. Bilateral simple appearing kidney cysts are noted. The largest is in the lateral cortex of the right kidney measuring 3.6 cm, image 65/3. No follow-up imaging recommended. No hydronephrosis identified bilaterally. Urinary bladder appears normal. Stomach/Bowel: Small hiatal hernia. Stomach is nondistended. The appendix is visualized and appears normal. No pathologic dilatation of the large or small bowel loops. Distal colonic diverticula noted without signs of acute diverticulitis. Vascular/Lymphatic: Aortic atherosclerosis. No aneurysm. No signs of abdominopelvic adenopathy. Reproductive: Partially calcified fibroid is noted anteriorly measuring 1.9 cm. No adnexal mass identified. Other: No ascites or focal fluid collections. No signs of peritoneal nodularity or mass. Musculoskeletal: Previous PLIF at L4-5. Degenerative disc  disease is noted at the remaining lumbar spine levels. Bilateral facet arthropathy. No acute or suspicious osseous findings. IMPRESSION: 1. Several bilateral lung nodules are again noted. There has been no significant change in the size of these nodules (within 1-2 mm). No new pulmonary lesions identified when compared with previous exam. 2. No evidence for metastatic disease to the abdomen or pelvis. 3. Coronary artery calcifications. 4. Aortic Atherosclerosis (ICD10-I70.0) and Emphysema (ICD10-J43.9). Electronically Signed   By: Signa Kell M.D.   On: 05/06/2022 13:56   CT ABDOMEN PELVIS WO CONTRAST  Result Date: 05/06/2022 CLINICAL DATA:  History of lung cancer. New hemorrhagic mass in the cerebellum. Restage lung cancer. * Tracking Code: BO * EXAM: CT CHEST, ABDOMEN AND PELVIS WITHOUT CONTRAST TECHNIQUE: Multidetector CT imaging of the chest, abdomen and pelvis was performed following the standard protocol without IV contrast. RADIATION DOSE REDUCTION: This exam was performed according to the departmental dose-optimization program which includes automated exposure control, adjustment of the mA and/or kV according to patient size and/or use of iterative reconstruction technique. COMPARISON:  CT chest from 11/28/2021 FINDINGS: CT CHEST FINDINGS Cardiovascular: Heart size is normal. Aortic atherosclerosis. Coronary artery calcifications. No pericardial effusion. Mediastinum/Nodes: Asymmetric heterogeneous enlargement of the left lobe of thyroid gland. This has been evaluated on previous imaging. (ref: J Am Coll Radiol. 2015 Feb;12(2): 143-50). Trachea appears patent and midline. Normal appearance of the esophagus. No mediastinal or axillary adenopathy. Hilar structures are suboptimally evaluated due to lack of IV contrast. Lungs/Pleura: Central airways appear patent. No pleural effusion or airspace consolidation. Mild to moderate emphysema. Status post left lower lobectomy. Scattered lung nodules are  identified, including: -within the periphery of the right lower lobe there is an irregular nodule measuring 1 cm, image 63/4. Previously this measured 0.9 cm. -Sub solid nodule in the posterior left lower lung measures 0.8 cm, image 93/4. Formally 0.6 cm. -Subpleural nodular density over the posterolateral left lower lung measures 1.2 x 0.5 cm, image 95/4. Previously this measured the same. Musculoskeletal: Status post ACDF within the cervical spine. No acute or suspicious bone lesions. No chest wall mass. CT ABDOMEN PELVIS FINDINGS Hepatobiliary: There are 2 small low-attenuation foci within the left lobe of liver and posteromedial right lobe of liver measuring up to 8 mm. These are technically too small to reliably characterize but appear unchanged. Gallbladder is unremarkable. Pancreas: Unremarkable. No pancreatic ductal dilatation or surrounding inflammatory changes. Spleen: Normal in size without focal abnormality. Adrenals/Urinary Tract: Normal appearance of the adrenal glands. No nephrolithiasis or hydronephrosis. Bilateral  simple appearing kidney cysts are noted. The largest is in the lateral cortex of the right kidney measuring 3.6 cm, image 65/3. No follow-up imaging recommended. No hydronephrosis identified bilaterally. Urinary bladder appears normal. Stomach/Bowel: Small hiatal hernia. Stomach is nondistended. The appendix is visualized and appears normal. No pathologic dilatation of the large or small bowel loops. Distal colonic diverticula noted without signs of acute diverticulitis. Vascular/Lymphatic: Aortic atherosclerosis. No aneurysm. No signs of abdominopelvic adenopathy. Reproductive: Partially calcified fibroid is noted anteriorly measuring 1.9 cm. No adnexal mass identified. Other: No ascites or focal fluid collections. No signs of peritoneal nodularity or mass. Musculoskeletal: Previous PLIF at L4-5. Degenerative disc disease is noted at the remaining lumbar spine levels. Bilateral facet  arthropathy. No acute or suspicious osseous findings. IMPRESSION: 1. Several bilateral lung nodules are again noted. There has been no significant change in the size of these nodules (within 1-2 mm). No new pulmonary lesions identified when compared with previous exam. 2. No evidence for metastatic disease to the abdomen or pelvis. 3. Coronary artery calcifications. 4. Aortic Atherosclerosis (ICD10-I70.0) and Emphysema (ICD10-J43.9). Electronically Signed   By: Signa Kell M.D.   On: 05/06/2022 13:56   MR BRAIN WO CONTRAST  Result Date: 05/06/2022 CLINICAL DATA:  Memory impairment.  Recent headaches and imbalance EXAM: MRI HEAD WITHOUT CONTRAST TECHNIQUE: Multiplanar, multiecho pulse sequences of the brain and surrounding structures were obtained without intravenous contrast. COMPARISON:  10/16/2005 FINDINGS: Brain: Isointense mass with hemorrhagic features (hemosiderin at its inferior periphery) measuring up to 2.7 cm. Moderate vasogenic edema in the mass causes effacement of the lower fourth ventricle with third and lateral ventriculomegaly. Mild periventricular FLAIR hyperintensity at the lateral ventricles which is more likely chronic small vessel ischemia than transependymal flow given incomplete appearance in the degree of ventriculomegaly. Gliosis and volume loss at the anterior left temporal lobe which was also seen on prior, possibly posttraumatic in this location. Case discussed with the patient's daughter who is driving the patient and ER referral has been made. [facility] made aware. Vascular: Major flow voids are preserved Skull and upper cervical spine: Normal marrow signal. Sinuses/Orbits: No significant finding. Other: Small proteinaceous cystic intensity anterior to the hyoid, consistent with thyroglossal duct cyst measuring 1 cm. IMPRESSION: Nearly 3 cm hemorrhagic mass with vasogenic edema in the lower cerebellum. There is effacement of the fourth ventricle with ventriculomegaly.  Presumed metastasis in this patient with history malignancy, recommend postcontrast assessment. Electronically Signed   By: Tiburcio Pea M.D.   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 PET scan results are pending. History of NSCLC.  She has progressive headaches that are becoming severe as well as balance issues.  I will start her on a dexamethasone taper today.  We discussed the risks and benefits of dexamethasone in detail.  I advised her and her family to make sure that she takes it with food.  I will not start omeprazole as it is contraindicated with her other medications upon review of her chart.  I had a lengthy discussion with the patient after reviewing their MRI results with them.  We spoke about the tumor board recommendations for her to undergo stereotactic radiosurgery.  I recommend that she receive this in 3 fractions to reduce the risk of peritumoral swelling and radionecrosis.  We will plan her treatment today and start her treatment in about a week.  The patient and her daughter are enthusiastic about this.  They understand there are no guarantees of treatment.  They understand that side effects may include but not necessarily be limited to fatigue, headache, worsening neurologic symptoms, injury to the brain, radionecrosis, need for surgical removal of mass.     Consent form was signed and placed in her chart.  I look forward to participating in her care.    On date of service, in total, I spent 60 minutes on this encounter. Patient was seen in person.   __________________________________________   Lonie Peak, MD  This document serves as a record of services personally performed by Lonie Peak, MD. It was created on her behalf by Neena Rhymes, a trained medical scribe. The creation of this record is based on the scribe's personal observations and the provider's  statements to them. This document has been checked and approved by the attending provider.

## 2022-05-18 NOTE — Telephone Encounter (Signed)
patient daughter called to verify time and date of appointemnts tomorrow.

## 2022-05-19 ENCOUNTER — Ambulatory Visit
Admission: RE | Admit: 2022-05-19 | Discharge: 2022-05-19 | Disposition: A | Discharge status: Home / Self Care | Payer: 59 | Source: Ambulatory Visit | Attending: Radiation Oncology | Admitting: Radiation Oncology

## 2022-05-19 ENCOUNTER — Ambulatory Visit
Admission: RE | Admit: 2022-05-19 | Discharge: 2022-05-19 | Disposition: A | Discharge status: Home / Self Care | Payer: 59 | Source: Ambulatory Visit

## 2022-05-19 ENCOUNTER — Encounter: Payer: Self-pay | Admitting: Hematology and Oncology

## 2022-05-19 ENCOUNTER — Ambulatory Visit: Payer: 59

## 2022-05-19 ENCOUNTER — Encounter (HOSPITAL_COMMUNITY): Payer: Self-pay

## 2022-05-19 ENCOUNTER — Other Ambulatory Visit: Payer: Self-pay

## 2022-05-19 ENCOUNTER — Inpatient Hospital Stay (HOSPITAL_BASED_OUTPATIENT_CLINIC_OR_DEPARTMENT_OTHER): Payer: 59 | Admitting: Hematology and Oncology

## 2022-05-19 ENCOUNTER — Institutional Professional Consult (permissible substitution): Payer: 59 | Admitting: Radiation Oncology

## 2022-05-19 ENCOUNTER — Encounter: Payer: Self-pay | Admitting: Radiation Oncology

## 2022-05-19 VITALS — BP 124/58 | HR 58 | Temp 97.6°F | Resp 20 | Ht 60.0 in

## 2022-05-19 VITALS — BP 143/82 | HR 65 | Temp 97.9°F | Resp 18

## 2022-05-19 DIAGNOSIS — J439 Emphysema, unspecified: Secondary | ICD-10-CM | POA: Diagnosis not present

## 2022-05-19 DIAGNOSIS — E559 Vitamin D deficiency, unspecified: Secondary | ICD-10-CM | POA: Diagnosis not present

## 2022-05-19 DIAGNOSIS — N183 Chronic kidney disease, stage 3 unspecified: Secondary | ICD-10-CM | POA: Insufficient documentation

## 2022-05-19 DIAGNOSIS — Z87891 Personal history of nicotine dependence: Secondary | ICD-10-CM | POA: Insufficient documentation

## 2022-05-19 DIAGNOSIS — C3432 Malignant neoplasm of lower lobe, left bronchus or lung: Secondary | ICD-10-CM | POA: Insufficient documentation

## 2022-05-19 DIAGNOSIS — Z7952 Long term (current) use of systemic steroids: Secondary | ICD-10-CM | POA: Insufficient documentation

## 2022-05-19 DIAGNOSIS — D696 Thrombocytopenia, unspecified: Secondary | ICD-10-CM | POA: Insufficient documentation

## 2022-05-19 DIAGNOSIS — I700 Atherosclerosis of aorta: Secondary | ICD-10-CM | POA: Diagnosis not present

## 2022-05-19 DIAGNOSIS — K219 Gastro-esophageal reflux disease without esophagitis: Secondary | ICD-10-CM | POA: Diagnosis not present

## 2022-05-19 DIAGNOSIS — I2510 Atherosclerotic heart disease of native coronary artery without angina pectoris: Secondary | ICD-10-CM | POA: Insufficient documentation

## 2022-05-19 DIAGNOSIS — Z85118 Personal history of other malignant neoplasm of bronchus and lung: Secondary | ICD-10-CM | POA: Diagnosis present

## 2022-05-19 DIAGNOSIS — Z801 Family history of malignant neoplasm of trachea, bronchus and lung: Secondary | ICD-10-CM | POA: Insufficient documentation

## 2022-05-19 DIAGNOSIS — Z7902 Long term (current) use of antithrombotics/antiplatelets: Secondary | ICD-10-CM | POA: Insufficient documentation

## 2022-05-19 DIAGNOSIS — C7931 Secondary malignant neoplasm of brain: Secondary | ICD-10-CM | POA: Insufficient documentation

## 2022-05-19 DIAGNOSIS — I129 Hypertensive chronic kidney disease with stage 1 through stage 4 chronic kidney disease, or unspecified chronic kidney disease: Secondary | ICD-10-CM | POA: Diagnosis not present

## 2022-05-19 DIAGNOSIS — Z803 Family history of malignant neoplasm of breast: Secondary | ICD-10-CM | POA: Diagnosis not present

## 2022-05-19 DIAGNOSIS — G939 Disorder of brain, unspecified: Secondary | ICD-10-CM | POA: Diagnosis not present

## 2022-05-19 DIAGNOSIS — Z79899 Other long term (current) drug therapy: Secondary | ICD-10-CM | POA: Insufficient documentation

## 2022-05-19 MED ORDER — DEXAMETHASONE 4 MG PO TABS: ORAL_TABLET | ORAL | 1 refills | Status: DC

## 2022-05-19 NOTE — Assessment & Plan Note (Signed)
I have reviewed PET CT imaging with the patient There is no signs of primary site of disease Her presentation is most likely due to recurrent lung cancer although the primary disease cannot be located on imaging studies She will continue radiation therapy as prescribed I plan to see her again in a few months for further follow-up

## 2022-05-19 NOTE — Progress Notes (Signed)
[facility] OFFICE PROGRESS NOTE  Patient Care Team: Georgina Quint, MD as PCP - General (Internal Medicine) Artis Delay, MD as Consulting Physician (Hematology and Oncology) Delight Ovens, MD (Inactive) as Consulting Physician (Cardiothoracic Surgery) Gelene Mink, OD as Referring Physician (Optometry) Gwynneth Munson, Corrie Dandy (Neurology)  ASSESSMENT & PLAN:  History of lung cancer I have reviewed PET CT imaging with the patient There is no signs of primary site of disease Her presentation is most likely due to recurrent lung cancer although the primary disease cannot be located on imaging studies She will continue radiation therapy as prescribed I plan to see her again in a few months for further follow-up  No orders of the defined types were placed in this encounter.   All questions were answered. The patient knows to call the clinic with any problems, questions or concerns. The total time spent in the appointment was 25 minutes encounter with patients including review of chart and various tests results, discussions about plan of care and coordination of care plan   Artis Delay, MD 05/19/2022 12:15 PM  INTERVAL HISTORY: Please see below for problem oriented charting. she returns for review test results with her daughter The patient is somewhat sleepy today No new neurological deficits She is scheduled to start radiation treatment We discussed recent PET/CT imaging results  REVIEW OF SYSTEMS:   Constitutional: Denies fevers, chills or abnormal weight loss Eyes: Denies blurriness of vision Ears, nose, mouth, throat, and face: Denies mucositis or sore throat Respiratory: Denies cough, dyspnea or wheezes Cardiovascular: Denies palpitation, chest discomfort or lower extremity swelling Gastrointestinal:  Denies nausea, heartburn or change in bowel habits Skin: Denies abnormal skin rashes Lymphatics: Denies new lymphadenopathy or easy  bruising Neurological:Denies numbness, tingling or new weaknesses Behavioral/Psych: Mood is stable, no new changes  All other systems were reviewed with the patient and are negative.  I have reviewed the past medical history, past surgical history, social history and family history with the patient and they are unchanged from previous note.  ALLERGIES:  is allergic to aspirin, lactose intolerance (gi), adhesive [tape], lisinopril, and percocet [oxycodone-acetaminophen].  MEDICATIONS:  Current Outpatient Medications  Medication Sig Dispense Refill   acetaminophen (TYLENOL) 500 MG tablet Take 1 tablet (500 mg total) by mouth every 6 (six) hours as needed. 30 tablet 0   atorvastatin (LIPITOR) 10 MG tablet TAKE 1 TABLET (10 MG TOTAL) BY MOUTH DAILY AT 6 PM. 90 tablet 3   chlorthalidone (HYGROTON) 25 MG tablet TAKE 1 TABLET BY MOUTH EVERY DAY 90 tablet 3   Cholecalciferol (VITAMIN D-3) 1000 units CAPS Take 1 capsule (1,000 Units total) by mouth daily. 90 capsule 1   clopidogrel (PLAVIX) 75 MG tablet TAKE 1 TABLET BY MOUTH EVERY DAY WITH BREAKFAST 90 tablet 2   dexamethasone (DECADRON) 4 MG tablet Take with food. Take 1 tablet TID initially. Then, on 5/13, taper to 1 tablet BID. On 5/27, taper to 1 tablet daily. On 6/17 taper to 1/2 tablet daily. Last dose: 7/5. 45 tablet 1   gabapentin (NEURONTIN) 300 MG capsule Take 300 mg by mouth daily.     LORazepam (ATIVAN) 0.5 MG tablet Take 1 tablet (0.5 mg total) by mouth 2 (two) times daily as needed for anxiety. 4 tablet 0   magnesium oxide (MAG-OX) 400 MG tablet Take 1 tablet (400 mg total) by mouth daily. 90 tablet 1   methocarbamol (ROBAXIN) 500 MG tablet TAKE 1 TABLET BY MOUTH AT BEDTIME AS NEEDED FOR  MUSCLE SPASMS. 30 tablet 2   metoprolol succinate (TOPROL-XL) 25 MG 24 hr tablet TAKE 1 TABLET (25 MG) BY ORAL ROUTE TWICE A DAY 180 tablet 3   Multiple Vitamin (MULTIVITAMIN) tablet Take 1 tablet by mouth at bedtime.      telmisartan (MICARDIS) 40 MG  tablet TAKE 1 TABLET BY MOUTH EVERY DAY 90 tablet 3   No current facility-administered medications for this visit.    SUMMARY OF ONCOLOGIC HISTORY: Oncology History Overview Note  Non-small cell lung cancer, adenocarcinoma   Primary site: Lung (Left)   Staging method: AJCC 7th Edition   Clinical: Stage IB (T2a, N0, M0) signed by Artis Delay, MD on 04/29/2013  9:01 PM   Pathologic: Stage IB (T2a, N0, cM0) signed by Delight Ovens, MD on 04/09/2013 12:51 PM   Summary: Stage IB (T2a, N0, cM0) Molecular studies done on sample from 2105 showed no actionable mutations. MSI stable, low tumor mutation burden    History of lung cancer  02/26/2013 Imaging   CXR for evaluation of cough showed new left lung mass   02/28/2013 Imaging   CT chest showed left new lung nodule   03/14/2013 Imaging   PET/CT scan showed localized disease in the left lung   04/07/2013 Surgery   She underwent bronchoscopy and left lower lobe resection and lymph node sampling with negative margins   05/06/2013 Procedure   The patient has placement of Infuse-a-Port   05/21/2013 - 07/30/2013 Chemotherapy   She completed 4 cycles of adjuvant chemotherapy with navelbine and cisplatin   07/01/2013 Imaging   CT scan of the chest shows stable appearance.   07/02/2013 Adverse Reaction   Dose of chemotherapy was adjusted further due to severe anemia.   12/31/2013 Imaging   CT scan of the chest show no evidence of disease recurrence   07/03/2014 Imaging   Chest x-ray looks normal   12/31/2014 Imaging   Ct chest showed no evidence of recurrence   05/31/2015 Imaging   Status post resection of a left lower lobe lung lesion without findings for recurrent tumor or metastatic disease.    05/29/2016 Imaging   1. Stable exam. Status post left lower lobectomy without evidence for new or progressive findings. 2. Emphysema. 3. Thoracic aortic atherosclerosis   05/30/2017 Imaging   1. Slight interval increase in size of right upper lobe  nodule. Recommend attention on follow-up. 2. Stable appearance of the left hemithorax with left lower lobectomy. 3. Aortic Atherosclerosis (ICD10-I70.0) and Emphysema (ICD10-J43.9).   11/13/2018 Imaging   1. No evidence of recurrent or metastatic disease. 2. Clustered peribronchovascular nodularity in the right upper lobe appears stable. 3. 7 mm left upper lobe ground-glass nodule, stable. Continued attention on follow-up exams is warranted. 4. Aortic atherosclerosis (ICD10-170.0). Coronary artery calcification. 5.  Emphysema (ICD10-J43.9).   05/19/2022 PET scan   NM PET Image Restage (PS) Skull Base to Thigh (F-18 FDG)  Result Date: 05/19/2022 CLINICAL DATA:  Subsequent treatment strategy for metastatic lung cancer. EXAM: NUCLEAR MEDICINE PET SKULL BASE TO THIGH TECHNIQUE: 6.84 mCi F-18 FDG was injected intravenously. Full-ring PET imaging was performed from the skull base to thigh after the radiotracer. CT data was obtained and used for attenuation correction and anatomic localization. Fasting blood glucose: 98 mg/dl COMPARISON:  CT 16/10/9602 FINDINGS: Mediastinal blood pool activity: SUV max 3.67 Liver activity: SUV max NA NECK: Mass within the cerebellum noted on MRI from 05/17/2022 is tracer avid within SUV max of 26.70. Incidental CT findings: No tracer avid  cervical lymph nodes. Nodule in the left lobe of thyroid gland containing calcifications measures 2.1 cm. No significant increased radiotracer uptake identified within this nodule. This has been evaluated on previous imaging. (ref: J Am Coll Radiol. 2015 Feb;12(2): 143-50). CHEST: No tracer avid mediastinal, hilar or axillary nodes. There are no tracer avid pulmonary nodules identified. Subsolid nodule in the posterior left lower lobe measures 7 mm without significant tracer uptake, image 38/7. This is technically too small to reliably characterize however low by PET-CT. The irregular nodule within the periphery of the right upper lobe  measuring 1 cm is stable from the previous exam without significant tracer uptake, image 20/7. Focal nodular thickening over the posterior lateral left lower lobe measures approximately 1.1 by 0.7 cm without significant tracer uptake, image 79/4. Incidental CT findings: Emphysema.Aortic atherosclerosis coronary artery calcifications. ABDOMEN/PELVIS: No abnormal tracer uptake identified within the liver, pancreas, spleen, or adrenal glands. Incidental CT findings: Aortic atherosclerosis. Small hiatal hernia. 3.7 cm right kidney cyst. No follow-up imaging recommended. SKELETON: No focal hypermetabolic activity to suggest skeletal metastasis. Incidental CT findings: None. IMPRESSION: 1. The mass within the cerebellum noted on MRI from 05/17/2022 is tracer avid with SUV max of 26.70. 2. No signs of tracer avid nodal metastasis or solid organ metastasis within the chest, abdomen or pelvis. 3. Stable small pulmonary nodules are identified without significant tracer uptake. 4. Coronary artery calcifications. 5.  Aortic Atherosclerosis (ICD10-I70.0). Electronically Signed   By: Signa Kell M.D.   On: 05/19/2022 09:28   MR Brain W Wo Contrast  Result Date: 05/18/2022 CLINICAL DATA:  Brain/CNS neoplasm, staging 3T SRS Protocol for radiation treatment planning. EXAM: MRI HEAD WITHOUT AND WITH CONTRAST TECHNIQUE: Multiplanar, multiecho pulse sequences of the brain and surrounding structures were obtained without and with intravenous contrast. CONTRAST:  6.5 mL Vueway. COMPARISON:  MRI brain 05/06/2022. FINDINGS: Brain: Unchanged 27 x 22 mm enhancing mass with peripheral hemosiderin staining and surrounding vasogenic edema centered within the medial aspect of the left cerebellar hemisphere (image 44 series 13). No new enhancing lesions. Unchanged mass effect on the inferior fourth ventricle. Stable size and configuration of the third and lateral ventricles. No acute infarct or acute hemorrhage. Unchanged encephalomalacia  in the left anterior temporal lobe, likely secondary to prior trauma. Vascular: Normal flow voids and enhancement. Skull and upper cervical spine: Normal marrow signal and enhancement. Sinuses/Orbits: Unremarkable. Other: None. IMPRESSION: 1. Unchanged 27 mm enhancing mass in the left cerebellar hemisphere with surrounding vasogenic edema. No new enhancing lesions. 2. Stable size and configuration of the third and lateral ventricles. Electronically Signed   By: Orvan Falconer M.D.   On: 05/18/2022 11:18   MR BRAIN W CONTRAST  Result Date: 05/06/2022 CLINICAL DATA:  Brain metastases, unknown primary EXAM: MRI HEAD WITH CONTRAST TECHNIQUE: Multiplanar, multiecho pulse sequences of the brain and surrounding structures were obtained with intravenous contrast. CONTRAST:  6.22mL GADAVIST GADOBUTROL 1 MMOL/ML IV SOLN COMPARISON:  Same day MRI head. FINDINGS: Postcontrast only imaging was performed to further evaluate the abnormality seen on same day noncontrast MRI. The mass within the left cerebellum is enhancing, compatible with a metastasis given the clinical history. No other enhancing lesions identified. Please see same day MRI head for further characterization, including effacement of the fourth ventricle in ventriculomegaly. IMPRESSION: Postcontrast only imaging. The mass within the left cerebellum is enhancing, compatible with a metastasis given the clinical history. No other enhancing lesions identified. Electronically Signed   By: Juluis Mire.D.  On: 05/06/2022 15:17   CT Chest Wo Contrast  Result Date: 05/06/2022 CLINICAL DATA:  History of lung cancer. New hemorrhagic mass in the cerebellum. Restage lung cancer. * Tracking Code: BO * EXAM: CT CHEST, ABDOMEN AND PELVIS WITHOUT CONTRAST TECHNIQUE: Multidetector CT imaging of the chest, abdomen and pelvis was performed following the standard protocol without IV contrast. RADIATION DOSE REDUCTION: This exam was performed according to the  departmental dose-optimization program which includes automated exposure control, adjustment of the mA and/or kV according to patient size and/or use of iterative reconstruction technique. COMPARISON:  CT chest from 11/28/2021 FINDINGS: CT CHEST FINDINGS Cardiovascular: Heart size is normal. Aortic atherosclerosis. Coronary artery calcifications. No pericardial effusion. Mediastinum/Nodes: Asymmetric heterogeneous enlargement of the left lobe of thyroid gland. This has been evaluated on previous imaging. (ref: J Am Coll Radiol. 2015 Feb;12(2): 143-50). Trachea appears patent and midline. Normal appearance of the esophagus. No mediastinal or axillary adenopathy. Hilar structures are suboptimally evaluated due to lack of IV contrast. Lungs/Pleura: Central airways appear patent. No pleural effusion or airspace consolidation. Mild to moderate emphysema. Status post left lower lobectomy. Scattered lung nodules are identified, including: -within the periphery of the right lower lobe there is an irregular nodule measuring 1 cm, image 63/4. Previously this measured 0.9 cm. -Sub solid nodule in the posterior left lower lung measures 0.8 cm, image 93/4. Formally 0.6 cm. -Subpleural nodular density over the posterolateral left lower lung measures 1.2 x 0.5 cm, image 95/4. Previously this measured the same. Musculoskeletal: Status post ACDF within the cervical spine. No acute or suspicious bone lesions. No chest wall mass. CT ABDOMEN PELVIS FINDINGS Hepatobiliary: There are 2 small low-attenuation foci within the left lobe of liver and posteromedial right lobe of liver measuring up to 8 mm. These are technically too small to reliably characterize but appear unchanged. Gallbladder is unremarkable. Pancreas: Unremarkable. No pancreatic ductal dilatation or surrounding inflammatory changes. Spleen: Normal in size without focal abnormality. Adrenals/Urinary Tract: Normal appearance of the adrenal glands. No nephrolithiasis or  hydronephrosis. Bilateral simple appearing kidney cysts are noted. The largest is in the lateral cortex of the right kidney measuring 3.6 cm, image 65/3. No follow-up imaging recommended. No hydronephrosis identified bilaterally. Urinary bladder appears normal. Stomach/Bowel: Small hiatal hernia. Stomach is nondistended. The appendix is visualized and appears normal. No pathologic dilatation of the large or small bowel loops. Distal colonic diverticula noted without signs of acute diverticulitis. Vascular/Lymphatic: Aortic atherosclerosis. No aneurysm. No signs of abdominopelvic adenopathy. Reproductive: Partially calcified fibroid is noted anteriorly measuring 1.9 cm. No adnexal mass identified. Other: No ascites or focal fluid collections. No signs of peritoneal nodularity or mass. Musculoskeletal: Previous PLIF at L4-5. Degenerative disc disease is noted at the remaining lumbar spine levels. Bilateral facet arthropathy. No acute or suspicious osseous findings. IMPRESSION: 1. Several bilateral lung nodules are again noted. There has been no significant change in the size of these nodules (within 1-2 mm). No new pulmonary lesions identified when compared with previous exam. 2. No evidence for metastatic disease to the abdomen or pelvis. 3. Coronary artery calcifications. 4. Aortic Atherosclerosis (ICD10-I70.0) and Emphysema (ICD10-J43.9). Electronically Signed   By: Signa Kell M.D.   On: 05/06/2022 13:56   CT ABDOMEN PELVIS WO CONTRAST  Result Date: 05/06/2022 CLINICAL DATA:  History of lung cancer. New hemorrhagic mass in the cerebellum. Restage lung cancer. * Tracking Code: BO * EXAM: CT CHEST, ABDOMEN AND PELVIS WITHOUT CONTRAST TECHNIQUE: Multidetector CT imaging of the chest, abdomen and pelvis was  performed following the standard protocol without IV contrast. RADIATION DOSE REDUCTION: This exam was performed according to the departmental dose-optimization program which includes automated exposure  control, adjustment of the mA and/or kV according to patient size and/or use of iterative reconstruction technique. COMPARISON:  CT chest from 11/28/2021 FINDINGS: CT CHEST FINDINGS Cardiovascular: Heart size is normal. Aortic atherosclerosis. Coronary artery calcifications. No pericardial effusion. Mediastinum/Nodes: Asymmetric heterogeneous enlargement of the left lobe of thyroid gland. This has been evaluated on previous imaging. (ref: J Am Coll Radiol. 2015 Feb;12(2): 143-50). Trachea appears patent and midline. Normal appearance of the esophagus. No mediastinal or axillary adenopathy. Hilar structures are suboptimally evaluated due to lack of IV contrast. Lungs/Pleura: Central airways appear patent. No pleural effusion or airspace consolidation. Mild to moderate emphysema. Status post left lower lobectomy. Scattered lung nodules are identified, including: -within the periphery of the right lower lobe there is an irregular nodule measuring 1 cm, image 63/4. Previously this measured 0.9 cm. -Sub solid nodule in the posterior left lower lung measures 0.8 cm, image 93/4. Formally 0.6 cm. -Subpleural nodular density over the posterolateral left lower lung measures 1.2 x 0.5 cm, image 95/4. Previously this measured the same. Musculoskeletal: Status post ACDF within the cervical spine. No acute or suspicious bone lesions. No chest wall mass. CT ABDOMEN PELVIS FINDINGS Hepatobiliary: There are 2 small low-attenuation foci within the left lobe of liver and posteromedial right lobe of liver measuring up to 8 mm. These are technically too small to reliably characterize but appear unchanged. Gallbladder is unremarkable. Pancreas: Unremarkable. No pancreatic ductal dilatation or surrounding inflammatory changes. Spleen: Normal in size without focal abnormality. Adrenals/Urinary Tract: Normal appearance of the adrenal glands. No nephrolithiasis or hydronephrosis. Bilateral simple appearing kidney cysts are noted. The largest  is in the lateral cortex of the right kidney measuring 3.6 cm, image 65/3. No follow-up imaging recommended. No hydronephrosis identified bilaterally. Urinary bladder appears normal. Stomach/Bowel: Small hiatal hernia. Stomach is nondistended. The appendix is visualized and appears normal. No pathologic dilatation of the large or small bowel loops. Distal colonic diverticula noted without signs of acute diverticulitis. Vascular/Lymphatic: Aortic atherosclerosis. No aneurysm. No signs of abdominopelvic adenopathy. Reproductive: Partially calcified fibroid is noted anteriorly measuring 1.9 cm. No adnexal mass identified. Other: No ascites or focal fluid collections. No signs of peritoneal nodularity or mass. Musculoskeletal: Previous PLIF at L4-5. Degenerative disc disease is noted at the remaining lumbar spine levels. Bilateral facet arthropathy. No acute or suspicious osseous findings. IMPRESSION: 1. Several bilateral lung nodules are again noted. There has been no significant change in the size of these nodules (within 1-2 mm). No new pulmonary lesions identified when compared with previous exam. 2. No evidence for metastatic disease to the abdomen or pelvis. 3. Coronary artery calcifications. 4. Aortic Atherosclerosis (ICD10-I70.0) and Emphysema (ICD10-J43.9). Electronically Signed   By: Signa Kell M.D.   On: 05/06/2022 13:56   MR BRAIN WO CONTRAST  Result Date: 05/06/2022 CLINICAL DATA:  Memory impairment.  Recent headaches and imbalance EXAM: MRI HEAD WITHOUT CONTRAST TECHNIQUE: Multiplanar, multiecho pulse sequences of the brain and surrounding structures were obtained without intravenous contrast. COMPARISON:  10/16/2005 FINDINGS: Brain: Isointense mass with hemorrhagic features (hemosiderin at its inferior periphery) measuring up to 2.7 cm. Moderate vasogenic edema in the mass causes effacement of the lower fourth ventricle with third and lateral ventriculomegaly. Mild periventricular FLAIR  hyperintensity at the lateral ventricles which is more likely chronic small vessel ischemia than transependymal flow given incomplete appearance in the  degree of ventriculomegaly. Gliosis and volume loss at the anterior left temporal lobe which was also seen on prior, possibly posttraumatic in this location. Case discussed with the patient's daughter who is driving the patient and ER referral has been made. [facility] made aware. Vascular: Major flow voids are preserved Skull and upper cervical spine: Normal marrow signal. Sinuses/Orbits: No significant finding. Other: Small proteinaceous cystic intensity anterior to the hyoid, consistent with thyroglossal duct cyst measuring 1 cm. IMPRESSION: Nearly 3 cm hemorrhagic mass with vasogenic edema in the lower cerebellum. There is effacement of the fourth ventricle with ventriculomegaly. Presumed metastasis in this patient with history malignancy, recommend postcontrast assessment. Electronically Signed   By: Tiburcio Pea M.D.   On: 05/06/2022 09:48        PHYSICAL EXAMINATION: ECOG PERFORMANCE STATUS: 2 - Symptomatic, <50% confined to bed  Vitals:   05/19/22 1150  BP: (!) 143/82  Pulse: 65  Resp: 18  Temp: 97.9 F (36.6 C)  SpO2: 100%   There were no vitals filed for this visit.  GENERAL:alert, no distress and comfortable NEURO: alert & oriented x 3 with fluent speech, no focal motor/sensory deficits  LABORATORY DATA:  I have reviewed the data as listed    Component Value Date/Time   NA 140 05/06/2022 1126   NA 144 07/30/2018 1217   NA 144 05/29/2016 1119   K 3.9 05/06/2022 1126   K 4.4 05/29/2016 1119   CL 106 05/06/2022 1126   CO2 21 (L) 05/06/2022 1126   CO2 23 05/29/2016 1119   GLUCOSE 88 05/06/2022 1126   GLUCOSE 91 05/29/2016 1119   BUN 24 (H) 05/06/2022 1126   BUN 21 07/30/2018 1217   BUN 20.8 05/29/2016 1119   CREATININE 2.00 (H) 05/06/2022 1126   CREATININE 1.7 (H) 05/29/2016 1119   CALCIUM 9.7 05/06/2022 1126    CALCIUM 9.5 05/29/2016 1119   PROT 7.3 05/23/2021 1336   PROT 7.1 07/30/2018 1217   PROT 6.8 05/29/2016 1119   ALBUMIN 4.2 05/23/2021 1336   ALBUMIN 4.6 07/30/2018 1217   ALBUMIN 3.7 05/29/2016 1119   AST 24 05/23/2021 1336   AST 28 05/29/2016 1119   ALT 17 05/23/2021 1336   ALT 29 05/29/2016 1119   ALKPHOS 64 05/23/2021 1336   ALKPHOS 68 05/29/2016 1119   BILITOT 0.5 05/23/2021 1336   BILITOT 0.3 07/30/2018 1217   BILITOT 0.24 05/29/2016 1119   GFRNONAA 24 (L) 05/06/2022 1126   GFRAA 32 (L) 11/04/2018 1120    No results found for: "SPEP", "UPEP"  Lab Results  Component Value Date   WBC 7.5 05/06/2022   NEUTROABS 5.3 05/06/2022   HGB 11.7 (L) 05/06/2022   HCT 36.4 05/06/2022   MCV 95.0 05/06/2022   PLT 250 05/06/2022      Chemistry      Component Value Date/Time   NA 140 05/06/2022 1126   NA 144 07/30/2018 1217   NA 144 05/29/2016 1119   K 3.9 05/06/2022 1126   K 4.4 05/29/2016 1119   CL 106 05/06/2022 1126   CO2 21 (L) 05/06/2022 1126   CO2 23 05/29/2016 1119   BUN 24 (H) 05/06/2022 1126   BUN 21 07/30/2018 1217   BUN 20.8 05/29/2016 1119   CREATININE 2.00 (H) 05/06/2022 1126   CREATININE 1.7 (H) 05/29/2016 1119      Component Value Date/Time   CALCIUM 9.7 05/06/2022 1126   CALCIUM 9.5 05/29/2016 1119   ALKPHOS 64 05/23/2021 1336  ALKPHOS 68 05/29/2016 1119   AST 24 05/23/2021 1336   AST 28 05/29/2016 1119   ALT 17 05/23/2021 1336   ALT 29 05/29/2016 1119   BILITOT 0.5 05/23/2021 1336   BILITOT 0.3 07/30/2018 1217   BILITOT 0.24 05/29/2016 1119       RADIOGRAPHIC STUDIES: I have personally reviewed the radiological images as listed and agreed with the findings in the report. NM PET Image Restage (PS) Skull Base to Thigh (F-18 FDG)  Result Date: 05/19/2022 CLINICAL DATA:  Subsequent treatment strategy for metastatic lung cancer. EXAM: NUCLEAR MEDICINE PET SKULL BASE TO THIGH TECHNIQUE: 6.84 mCi F-18 FDG was injected intravenously. Full-ring PET  imaging was performed from the skull base to thigh after the radiotracer. CT data was obtained and used for attenuation correction and anatomic localization. Fasting blood glucose: 98 mg/dl COMPARISON:  CT 16/10/9602 FINDINGS: Mediastinal blood pool activity: SUV max 3.67 Liver activity: SUV max NA NECK: Mass within the cerebellum noted on MRI from 05/17/2022 is tracer avid within SUV max of 26.70. Incidental CT findings: No tracer avid cervical lymph nodes. Nodule in the left lobe of thyroid gland containing calcifications measures 2.1 cm. No significant increased radiotracer uptake identified within this nodule. This has been evaluated on previous imaging. (ref: J Am Coll Radiol. 2015 Feb;12(2): 143-50). CHEST: No tracer avid mediastinal, hilar or axillary nodes. There are no tracer avid pulmonary nodules identified. Subsolid nodule in the posterior left lower lobe measures 7 mm without significant tracer uptake, image 38/7. This is technically too small to reliably characterize however low by PET-CT. The irregular nodule within the periphery of the right upper lobe measuring 1 cm is stable from the previous exam without significant tracer uptake, image 20/7. Focal nodular thickening over the posterior lateral left lower lobe measures approximately 1.1 by 0.7 cm without significant tracer uptake, image 79/4. Incidental CT findings: Emphysema.Aortic atherosclerosis coronary artery calcifications. ABDOMEN/PELVIS: No abnormal tracer uptake identified within the liver, pancreas, spleen, or adrenal glands. Incidental CT findings: Aortic atherosclerosis. Small hiatal hernia. 3.7 cm right kidney cyst. No follow-up imaging recommended. SKELETON: No focal hypermetabolic activity to suggest skeletal metastasis. Incidental CT findings: None. IMPRESSION: 1. The mass within the cerebellum noted on MRI from 05/17/2022 is tracer avid with SUV max of 26.70. 2. No signs of tracer avid nodal metastasis or solid organ metastasis  within the chest, abdomen or pelvis. 3. Stable small pulmonary nodules are identified without significant tracer uptake. 4. Coronary artery calcifications. 5.  Aortic Atherosclerosis (ICD10-I70.0). Electronically Signed   By: Signa Kell M.D.   On: 05/19/2022 09:28   MR Brain W Wo Contrast  Result Date: 05/18/2022 CLINICAL DATA:  Brain/CNS neoplasm, staging 3T SRS Protocol for radiation treatment planning. EXAM: MRI HEAD WITHOUT AND WITH CONTRAST TECHNIQUE: Multiplanar, multiecho pulse sequences of the brain and surrounding structures were obtained without and with intravenous contrast. CONTRAST:  6.5 mL Vueway. COMPARISON:  MRI brain 05/06/2022. FINDINGS: Brain: Unchanged 27 x 22 mm enhancing mass with peripheral hemosiderin staining and surrounding vasogenic edema centered within the medial aspect of the left cerebellar hemisphere (image 44 series 13). No new enhancing lesions. Unchanged mass effect on the inferior fourth ventricle. Stable size and configuration of the third and lateral ventricles. No acute infarct or acute hemorrhage. Unchanged encephalomalacia in the left anterior temporal lobe, likely secondary to prior trauma. Vascular: Normal flow voids and enhancement. Skull and upper cervical spine: Normal marrow signal and enhancement. Sinuses/Orbits: Unremarkable. Other: None. IMPRESSION: 1. Unchanged 27 mm  enhancing mass in the left cerebellar hemisphere with surrounding vasogenic edema. No new enhancing lesions. 2. Stable size and configuration of the third and lateral ventricles. Electronically Signed   By: Orvan Falconer M.D.   On: 05/18/2022 11:18   MR BRAIN W CONTRAST  Result Date: 05/06/2022 CLINICAL DATA:  Brain metastases, unknown primary EXAM: MRI HEAD WITH CONTRAST TECHNIQUE: Multiplanar, multiecho pulse sequences of the brain and surrounding structures were obtained with intravenous contrast. CONTRAST:  6.57mL GADAVIST GADOBUTROL 1 MMOL/ML IV SOLN COMPARISON:  Same day MRI head.  FINDINGS: Postcontrast only imaging was performed to further evaluate the abnormality seen on same day noncontrast MRI. The mass within the left cerebellum is enhancing, compatible with a metastasis given the clinical history. No other enhancing lesions identified. Please see same day MRI head for further characterization, including effacement of the fourth ventricle in ventriculomegaly. IMPRESSION: Postcontrast only imaging. The mass within the left cerebellum is enhancing, compatible with a metastasis given the clinical history. No other enhancing lesions identified. Electronically Signed   By: Feliberto Harts M.D.   On: 05/06/2022 15:17   CT Chest Wo Contrast  Result Date: 05/06/2022 CLINICAL DATA:  History of lung cancer. New hemorrhagic mass in the cerebellum. Restage lung cancer. * Tracking Code: BO * EXAM: CT CHEST, ABDOMEN AND PELVIS WITHOUT CONTRAST TECHNIQUE: Multidetector CT imaging of the chest, abdomen and pelvis was performed following the standard protocol without IV contrast. RADIATION DOSE REDUCTION: This exam was performed according to the departmental dose-optimization program which includes automated exposure control, adjustment of the mA and/or kV according to patient size and/or use of iterative reconstruction technique. COMPARISON:  CT chest from 11/28/2021 FINDINGS: CT CHEST FINDINGS Cardiovascular: Heart size is normal. Aortic atherosclerosis. Coronary artery calcifications. No pericardial effusion. Mediastinum/Nodes: Asymmetric heterogeneous enlargement of the left lobe of thyroid gland. This has been evaluated on previous imaging. (ref: J Am Coll Radiol. 2015 Feb;12(2): 143-50). Trachea appears patent and midline. Normal appearance of the esophagus. No mediastinal or axillary adenopathy. Hilar structures are suboptimally evaluated due to lack of IV contrast. Lungs/Pleura: Central airways appear patent. No pleural effusion or airspace consolidation. Mild to moderate emphysema. Status  post left lower lobectomy. Scattered lung nodules are identified, including: -within the periphery of the right lower lobe there is an irregular nodule measuring 1 cm, image 63/4. Previously this measured 0.9 cm. -Sub solid nodule in the posterior left lower lung measures 0.8 cm, image 93/4. Formally 0.6 cm. -Subpleural nodular density over the posterolateral left lower lung measures 1.2 x 0.5 cm, image 95/4. Previously this measured the same. Musculoskeletal: Status post ACDF within the cervical spine. No acute or suspicious bone lesions. No chest wall mass. CT ABDOMEN PELVIS FINDINGS Hepatobiliary: There are 2 small low-attenuation foci within the left lobe of liver and posteromedial right lobe of liver measuring up to 8 mm. These are technically too small to reliably characterize but appear unchanged. Gallbladder is unremarkable. Pancreas: Unremarkable. No pancreatic ductal dilatation or surrounding inflammatory changes. Spleen: Normal in size without focal abnormality. Adrenals/Urinary Tract: Normal appearance of the adrenal glands. No nephrolithiasis or hydronephrosis. Bilateral simple appearing kidney cysts are noted. The largest is in the lateral cortex of the right kidney measuring 3.6 cm, image 65/3. No follow-up imaging recommended. No hydronephrosis identified bilaterally. Urinary bladder appears normal. Stomach/Bowel: Small hiatal hernia. Stomach is nondistended. The appendix is visualized and appears normal. No pathologic dilatation of the large or small bowel loops. Distal colonic diverticula noted without signs of acute diverticulitis.  Vascular/Lymphatic: Aortic atherosclerosis. No aneurysm. No signs of abdominopelvic adenopathy. Reproductive: Partially calcified fibroid is noted anteriorly measuring 1.9 cm. No adnexal mass identified. Other: No ascites or focal fluid collections. No signs of peritoneal nodularity or mass. Musculoskeletal: Previous PLIF at L4-5. Degenerative disc disease is noted at  the remaining lumbar spine levels. Bilateral facet arthropathy. No acute or suspicious osseous findings. IMPRESSION: 1. Several bilateral lung nodules are again noted. There has been no significant change in the size of these nodules (within 1-2 mm). No new pulmonary lesions identified when compared with previous exam. 2. No evidence for metastatic disease to the abdomen or pelvis. 3. Coronary artery calcifications. 4. Aortic Atherosclerosis (ICD10-I70.0) and Emphysema (ICD10-J43.9). Electronically Signed   By: Signa Kell M.D.   On: 05/06/2022 13:56   CT ABDOMEN PELVIS WO CONTRAST  Result Date: 05/06/2022 CLINICAL DATA:  History of lung cancer. New hemorrhagic mass in the cerebellum. Restage lung cancer. * Tracking Code: BO * EXAM: CT CHEST, ABDOMEN AND PELVIS WITHOUT CONTRAST TECHNIQUE: Multidetector CT imaging of the chest, abdomen and pelvis was performed following the standard protocol without IV contrast. RADIATION DOSE REDUCTION: This exam was performed according to the departmental dose-optimization program which includes automated exposure control, adjustment of the mA and/or kV according to patient size and/or use of iterative reconstruction technique. COMPARISON:  CT chest from 11/28/2021 FINDINGS: CT CHEST FINDINGS Cardiovascular: Heart size is normal. Aortic atherosclerosis. Coronary artery calcifications. No pericardial effusion. Mediastinum/Nodes: Asymmetric heterogeneous enlargement of the left lobe of thyroid gland. This has been evaluated on previous imaging. (ref: J Am Coll Radiol. 2015 Feb;12(2): 143-50). Trachea appears patent and midline. Normal appearance of the esophagus. No mediastinal or axillary adenopathy. Hilar structures are suboptimally evaluated due to lack of IV contrast. Lungs/Pleura: Central airways appear patent. No pleural effusion or airspace consolidation. Mild to moderate emphysema. Status post left lower lobectomy. Scattered lung nodules are identified, including:  -within the periphery of the right lower lobe there is an irregular nodule measuring 1 cm, image 63/4. Previously this measured 0.9 cm. -Sub solid nodule in the posterior left lower lung measures 0.8 cm, image 93/4. Formally 0.6 cm. -Subpleural nodular density over the posterolateral left lower lung measures 1.2 x 0.5 cm, image 95/4. Previously this measured the same. Musculoskeletal: Status post ACDF within the cervical spine. No acute or suspicious bone lesions. No chest wall mass. CT ABDOMEN PELVIS FINDINGS Hepatobiliary: There are 2 small low-attenuation foci within the left lobe of liver and posteromedial right lobe of liver measuring up to 8 mm. These are technically too small to reliably characterize but appear unchanged. Gallbladder is unremarkable. Pancreas: Unremarkable. No pancreatic ductal dilatation or surrounding inflammatory changes. Spleen: Normal in size without focal abnormality. Adrenals/Urinary Tract: Normal appearance of the adrenal glands. No nephrolithiasis or hydronephrosis. Bilateral simple appearing kidney cysts are noted. The largest is in the lateral cortex of the right kidney measuring 3.6 cm, image 65/3. No follow-up imaging recommended. No hydronephrosis identified bilaterally. Urinary bladder appears normal. Stomach/Bowel: Small hiatal hernia. Stomach is nondistended. The appendix is visualized and appears normal. No pathologic dilatation of the large or small bowel loops. Distal colonic diverticula noted without signs of acute diverticulitis. Vascular/Lymphatic: Aortic atherosclerosis. No aneurysm. No signs of abdominopelvic adenopathy. Reproductive: Partially calcified fibroid is noted anteriorly measuring 1.9 cm. No adnexal mass identified. Other: No ascites or focal fluid collections. No signs of peritoneal nodularity or mass. Musculoskeletal: Previous PLIF at L4-5. Degenerative disc disease is noted at the remaining lumbar spine  levels. Bilateral facet arthropathy. No acute or  suspicious osseous findings. IMPRESSION: 1. Several bilateral lung nodules are again noted. There has been no significant change in the size of these nodules (within 1-2 mm). No new pulmonary lesions identified when compared with previous exam. 2. No evidence for metastatic disease to the abdomen or pelvis. 3. Coronary artery calcifications. 4. Aortic Atherosclerosis (ICD10-I70.0) and Emphysema (ICD10-J43.9). Electronically Signed   By: Signa Kell M.D.   On: 05/06/2022 13:56   MR BRAIN WO CONTRAST  Result Date: 05/06/2022 CLINICAL DATA:  Memory impairment.  Recent headaches and imbalance EXAM: MRI HEAD WITHOUT CONTRAST TECHNIQUE: Multiplanar, multiecho pulse sequences of the brain and surrounding structures were obtained without intravenous contrast. COMPARISON:  10/16/2005 FINDINGS: Brain: Isointense mass with hemorrhagic features (hemosiderin at its inferior periphery) measuring up to 2.7 cm. Moderate vasogenic edema in the mass causes effacement of the lower fourth ventricle with third and lateral ventriculomegaly. Mild periventricular FLAIR hyperintensity at the lateral ventricles which is more likely chronic small vessel ischemia than transependymal flow given incomplete appearance in the degree of ventriculomegaly. Gliosis and volume loss at the anterior left temporal lobe which was also seen on prior, possibly posttraumatic in this location. Case discussed with the patient's daughter who is driving the patient and ER referral has been made. [facility] made aware. Vascular: Major flow voids are preserved Skull and upper cervical spine: Normal marrow signal. Sinuses/Orbits: No significant finding. Other: Small proteinaceous cystic intensity anterior to the hyoid, consistent with thyroglossal duct cyst measuring 1 cm. IMPRESSION: Nearly 3 cm hemorrhagic mass with vasogenic edema in the lower cerebellum. There is effacement of the fourth ventricle with ventriculomegaly. Presumed metastasis in this  patient with history malignancy, recommend postcontrast assessment. Electronically Signed   By: Tiburcio Pea M.D.   On: 05/06/2022 09:48

## 2022-05-23 DIAGNOSIS — C7931 Secondary malignant neoplasm of brain: Secondary | ICD-10-CM | POA: Diagnosis not present

## 2022-05-26 ENCOUNTER — Ambulatory Visit
Admission: RE | Admit: 2022-05-26 | Discharge: 2022-05-26 | Disposition: A | Discharge status: Home / Self Care | Payer: 59 | Source: Ambulatory Visit | Attending: Radiation Oncology | Admitting: Radiation Oncology

## 2022-05-26 ENCOUNTER — Other Ambulatory Visit: Payer: Self-pay

## 2022-05-26 DIAGNOSIS — C7931 Secondary malignant neoplasm of brain: Secondary | ICD-10-CM | POA: Diagnosis not present

## 2022-05-26 DIAGNOSIS — Z85118 Personal history of other malignant neoplasm of bronchus and lung: Secondary | ICD-10-CM | POA: Insufficient documentation

## 2022-05-26 LAB — RAD ONC ARIA SESSION SUMMARY
Course Elapsed Days: 0
Plan Fractions Treated to Date: 1
Plan Prescribed Dose Per Fraction: 9 Gy
Plan Total Fractions Prescribed: 3
Plan Total Prescribed Dose: 27 Gy
Reference Point Dosage Given to Date: 9 Gy
Reference Point Session Dosage Given: 9 Gy
Session Number: 1

## 2022-05-26 NOTE — Progress Notes (Signed)
Patient ID: [name], female   DOB: 1935-05-20, 87 y.o.   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  I participated closely in the radiation treatment planning afterwards. The patient underwent planning CT which was fused to 3T high resolution MRI with 1 mm axial slices.  These images were fused on the planning system.  We contoured the gross target volumes and subsequently expanded this to yield the Planning Target Volume. I actively participated in the planning process.  I helped to define and review the target contours and also the contours of the optic pathway, eyes, brainstem and selected nearby organs at risk.  All the dose constraints for critical structures were reviewed and compared to AAPM Task Group 101.  The prescription dose conformity was reviewed.  I approved the plan electronically.    Accordingly, [name] was brought to the TrueBeam stereotactic radiation treatment linac and placed in the custom immobilization mask.  The patient was aligned according to the IR fiducial markers with BrainLab Exactrac, then orthogonal x-rays were used in ExacTrac with the 6DOF robotic table and the shifts were made to align the patient  [name] received stereotactic radiosurgery uneventfully.    The detailed description of the procedure is recorded in the radiation oncology procedure note.  I was present for the duration of the procedure.  DISPOSITION:  Following delivery, the patient was transported to nursing in  stable condition and monitored for possible acute effects to be discharged to home in stable condition with follow-up in one month.  Tia Alert, MD 05/26/2022 10:56 AM

## 2022-05-26 NOTE — Progress Notes (Signed)
Mrs. Willsey rested with [place] for 15 minutes following her SRS treatment.  Patient denies headache, dizziness, nausea, diplopia or ringing in the ears. Denies fatigue. Patient without complaints. Understands to avoid strenuous activity for the next 24 hours and call [phone] with needs.   BP (!) 145/62 (BP Location: Left Arm)   Pulse (!) 52   Temp 98.2 F (36.8 C)   Resp 18   SpO2 100%

## 2022-05-30 ENCOUNTER — Ambulatory Visit: Payer: Medicare Other | Admitting: Emergency Medicine

## 2022-05-30 ENCOUNTER — Ambulatory Visit: Payer: 59 | Admitting: Radiation Oncology

## 2022-05-30 ENCOUNTER — Ambulatory Visit: Payer: 59

## 2022-05-31 ENCOUNTER — Telehealth: Payer: Self-pay | Admitting: Radiation Therapy

## 2022-05-31 NOTE — Telephone Encounter (Signed)
Received a return call from a family member, Makenzi Scotland, [phone]. He said that the patient did not come for treatment yesterday because her daughter was sick and unable to bring her. This missed fraction has been rescheduled to Monday 5/13. We will give them an updated calendar when she comes in for her next treatment on 5/9.  [name] R.T.(R)(T) Radiation Special Procedures Navigator

## 2022-05-31 NOTE — Telephone Encounter (Signed)
[name] was a no show for the second fraction of her SRS course on 05/30/22. I have attempted to contact the patient and her daughter but have not been able to speak with either of them. I left a message on 5/7 but was unable to leave a message on 5/8 because her daughter's mail box is full and the patient's answering machine at home does not give the option to leave a message. I will call again later today.   [name] R.T.(R)(T) Radiation Special Procedures Navigator

## 2022-06-01 ENCOUNTER — Other Ambulatory Visit: Payer: Self-pay

## 2022-06-01 ENCOUNTER — Ambulatory Visit
Admission: RE | Admit: 2022-06-01 | Discharge: 2022-06-01 | Disposition: A | Discharge status: Home / Self Care | Payer: 59 | Source: Ambulatory Visit | Attending: Radiation Oncology | Admitting: Radiation Oncology

## 2022-06-01 DIAGNOSIS — C7931 Secondary malignant neoplasm of brain: Secondary | ICD-10-CM | POA: Diagnosis not present

## 2022-06-01 LAB — RAD ONC ARIA SESSION SUMMARY
Course Elapsed Days: 6
Plan Fractions Treated to Date: 2
Plan Prescribed Dose Per Fraction: 9 Gy
Plan Total Fractions Prescribed: 3
Plan Total Prescribed Dose: 27 Gy
Reference Point Dosage Given to Date: 18 Gy
Reference Point Session Dosage Given: 9 Gy
Session Number: 2

## 2022-06-01 NOTE — Progress Notes (Signed)
Patient rested with [place] for 15 minutes status post 2 of 3 SRS treatments. Patient without complaints. Patient denies new or worsening neurologic symptoms. Vitals stable. Instructed patient to avoid strenuous activity for the next 24 hours.  Instructed patient to call [phone] with needs related to treatment after hours or over the weekend. Patient verbalized understanding and confirmed awareness of final treatment on Monday 06/05/22. Patient ambulated out of clinic unassisted without incident to her brother's vehicle.  Vitals:   06/01/22 1326  BP: 119/66  Pulse: (!) 58  Resp: 18  Temp: 98.1 F (36.7 C)  SpO2: 100%

## 2022-06-05 ENCOUNTER — Ambulatory Visit
Admission: RE | Admit: 2022-06-05 | Discharge: 2022-06-05 | Disposition: A | Discharge status: Home / Self Care | Payer: 59 | Source: Ambulatory Visit | Attending: Radiation Oncology | Admitting: Radiation Oncology

## 2022-06-05 ENCOUNTER — Other Ambulatory Visit: Payer: Self-pay

## 2022-06-05 ENCOUNTER — Inpatient Hospital Stay: Payer: 59 | Attending: Hematology and Oncology | Admitting: Internal Medicine

## 2022-06-05 ENCOUNTER — Ambulatory Visit: Payer: 59 | Admitting: Emergency Medicine

## 2022-06-05 ENCOUNTER — Inpatient Hospital Stay: Payer: 59 | Admitting: Internal Medicine

## 2022-06-05 VITALS — BP 136/65 | HR 53 | Temp 97.5°F | Resp 18 | Ht 60.0 in | Wt 132.1 lb

## 2022-06-05 DIAGNOSIS — Z510 Encounter for antineoplastic radiation therapy: Secondary | ICD-10-CM | POA: Insufficient documentation

## 2022-06-05 DIAGNOSIS — C7931 Secondary malignant neoplasm of brain: Secondary | ICD-10-CM | POA: Diagnosis present

## 2022-06-05 LAB — RAD ONC ARIA SESSION SUMMARY
Course Elapsed Days: 10
Plan Fractions Treated to Date: 3
Plan Prescribed Dose Per Fraction: 9 Gy
Plan Total Fractions Prescribed: 3
Plan Total Prescribed Dose: 27 Gy
Reference Point Dosage Given to Date: 27 Gy
Reference Point Session Dosage Given: 9 Gy
Session Number: 3

## 2022-06-05 NOTE — Progress Notes (Signed)
[facility] [address]  [place], [place] [address] [phone]   Interval Evaluation  Date of Service: 06/05/22 Patient Name: MARKAYLA DIVIN Patient MRN: [id number] Patient DOB: 1935-05-23 Provider: Henreitta Leber, MD  Identifying Statement:  LEISL DENMAN is a 87 y.o. female with Malignant neoplasm metastatic to brain [facility])    Primary Cancer:  Oncologic History: Oncology History Overview Note  Non-small cell lung cancer, adenocarcinoma   Primary site: Lung (Left)   Staging method: AJCC 7th Edition   Clinical: Stage IB (T2a, N0, M0) signed by Artis Delay, MD on 04/29/2013  9:01 PM   Pathologic: Stage IB (T2a, N0, cM0) signed by Delight Ovens, MD on 04/09/2013 12:51 PM   Summary: Stage IB (T2a, N0, cM0) Molecular studies done on sample from 2105 showed no actionable mutations. MSI stable, low tumor mutation burden    History of lung cancer  02/26/2013 Imaging   CXR for evaluation of cough showed new left lung mass   02/28/2013 Imaging   CT chest showed left new lung nodule   03/14/2013 Imaging   PET/CT scan showed localized disease in the left lung   04/07/2013 Surgery   She underwent bronchoscopy and left lower lobe resection and lymph node sampling with negative margins   05/06/2013 Procedure   The patient has placement of Infuse-a-Port   05/21/2013 - 07/30/2013 Chemotherapy   She completed 4 cycles of adjuvant chemotherapy with navelbine and cisplatin   07/01/2013 Imaging   CT scan of the chest shows stable appearance.   07/02/2013 Adverse Reaction   Dose of chemotherapy was adjusted further due to severe anemia.   12/31/2013 Imaging   CT scan of the chest show no evidence of disease recurrence   07/03/2014 Imaging   Chest x-ray looks normal   12/31/2014 Imaging   Ct chest showed no evidence of recurrence   05/31/2015 Imaging   Status post resection of a left lower lobe lung lesion without findings for recurrent tumor or  metastatic disease.    05/29/2016 Imaging   1. Stable exam. Status post left lower lobectomy without evidence for new or progressive findings. 2. Emphysema. 3. Thoracic aortic atherosclerosis   05/30/2017 Imaging   1. Slight interval increase in size of right upper lobe nodule. Recommend attention on follow-up. 2. Stable appearance of the left hemithorax with left lower lobectomy. 3. Aortic Atherosclerosis (ICD10-I70.0) and Emphysema (ICD10-J43.9).   11/13/2018 Imaging   1. No evidence of recurrent or metastatic disease. 2. Clustered peribronchovascular nodularity in the right upper lobe appears stable. 3. 7 mm left upper lobe ground-glass nodule, stable. Continued attention on follow-up exams is warranted. 4. Aortic atherosclerosis (ICD10-170.0). Coronary artery calcification. 5.  Emphysema (ICD10-J43.9).   05/19/2022 PET scan   NM PET Image Restage (PS) Skull Base to Thigh (F-18 FDG)  Result Date: 05/19/2022 CLINICAL DATA:  Subsequent treatment strategy for metastatic lung cancer. EXAM: NUCLEAR MEDICINE PET SKULL BASE TO THIGH TECHNIQUE: 6.84 mCi F-18 FDG was injected intravenously. Full-ring PET imaging was performed from the skull base to thigh after the radiotracer. CT data was obtained and used for attenuation correction and anatomic localization. Fasting blood glucose: 98 mg/dl COMPARISON:  CT 21/30/8657 FINDINGS: Mediastinal blood pool activity: SUV max 3.67 Liver activity: SUV max NA NECK: Mass within the cerebellum noted on MRI from 05/17/2022 is tracer avid within SUV max of 26.70. Incidental CT findings: No tracer avid cervical lymph nodes. Nodule in the left lobe of  thyroid gland containing calcifications measures 2.1 cm. No significant increased radiotracer uptake identified within this nodule. This has been evaluated on previous imaging. (ref: J Am Coll Radiol. 2015 Feb;12(2): 143-50). CHEST: No tracer avid mediastinal, hilar or axillary nodes. There are no tracer avid pulmonary  nodules identified. Subsolid nodule in the posterior left lower lobe measures 7 mm without significant tracer uptake, image 38/7. This is technically too small to reliably characterize however low by PET-CT. The irregular nodule within the periphery of the right upper lobe measuring 1 cm is stable from the previous exam without significant tracer uptake, image 20/7. Focal nodular thickening over the posterior lateral left lower lobe measures approximately 1.1 by 0.7 cm without significant tracer uptake, image 79/4. Incidental CT findings: Emphysema.Aortic atherosclerosis coronary artery calcifications. ABDOMEN/PELVIS: No abnormal tracer uptake identified within the liver, pancreas, spleen, or adrenal glands. Incidental CT findings: Aortic atherosclerosis. Small hiatal hernia. 3.7 cm right kidney cyst. No follow-up imaging recommended. SKELETON: No focal hypermetabolic activity to suggest skeletal metastasis. Incidental CT findings: None. IMPRESSION: 1. The mass within the cerebellum noted on MRI from 05/17/2022 is tracer avid with SUV max of 26.70. 2. No signs of tracer avid nodal metastasis or solid organ metastasis within the chest, abdomen or pelvis. 3. Stable small pulmonary nodules are identified without significant tracer uptake. 4. Coronary artery calcifications. 5.  Aortic Atherosclerosis (ICD10-I70.0). Electronically Signed   By: Signa Kell M.D.   On: 05/19/2022 09:28   MR Brain W Wo Contrast  Result Date: 05/18/2022 CLINICAL DATA:  Brain/CNS neoplasm, staging 3T SRS Protocol for radiation treatment planning. EXAM: MRI HEAD WITHOUT AND WITH CONTRAST TECHNIQUE: Multiplanar, multiecho pulse sequences of the brain and surrounding structures were obtained without and with intravenous contrast. CONTRAST:  6.5 mL Vueway. COMPARISON:  MRI brain 05/06/2022. FINDINGS: Brain: Unchanged 27 x 22 mm enhancing mass with peripheral hemosiderin staining and surrounding vasogenic edema centered within the medial  aspect of the left cerebellar hemisphere (image 44 series 13). No new enhancing lesions. Unchanged mass effect on the inferior fourth ventricle. Stable size and configuration of the third and lateral ventricles. No acute infarct or acute hemorrhage. Unchanged encephalomalacia in the left anterior temporal lobe, likely secondary to prior trauma. Vascular: Normal flow voids and enhancement. Skull and upper cervical spine: Normal marrow signal and enhancement. Sinuses/Orbits: Unremarkable. Other: None. IMPRESSION: 1. Unchanged 27 mm enhancing mass in the left cerebellar hemisphere with surrounding vasogenic edema. No new enhancing lesions. 2. Stable size and configuration of the third and lateral ventricles. Electronically Signed   By: Orvan Falconer M.D.   On: 05/18/2022 11:18   MR BRAIN W CONTRAST  Result Date: 05/06/2022 CLINICAL DATA:  Brain metastases, unknown primary EXAM: MRI HEAD WITH CONTRAST TECHNIQUE: Multiplanar, multiecho pulse sequences of the brain and surrounding structures were obtained with intravenous contrast. CONTRAST:  6.45mL GADAVIST GADOBUTROL 1 MMOL/ML IV SOLN COMPARISON:  Same day MRI head. FINDINGS: Postcontrast only imaging was performed to further evaluate the abnormality seen on same day noncontrast MRI. The mass within the left cerebellum is enhancing, compatible with a metastasis given the clinical history. No other enhancing lesions identified. Please see same day MRI head for further characterization, including effacement of the fourth ventricle in ventriculomegaly. IMPRESSION: Postcontrast only imaging. The mass within the left cerebellum is enhancing, compatible with a metastasis given the clinical history. No other enhancing lesions identified. Electronically Signed   By: Feliberto Harts M.D.   On: 05/06/2022 15:17   CT Chest Wo Contrast  Result Date: 05/06/2022 CLINICAL DATA:  History of lung cancer. New hemorrhagic mass in the cerebellum. Restage lung cancer. * Tracking  Code: BO * EXAM: CT CHEST, ABDOMEN AND PELVIS WITHOUT CONTRAST TECHNIQUE: Multidetector CT imaging of the chest, abdomen and pelvis was performed following the standard protocol without IV contrast. RADIATION DOSE REDUCTION: This exam was performed according to the departmental dose-optimization program which includes automated exposure control, adjustment of the mA and/or kV according to patient size and/or use of iterative reconstruction technique. COMPARISON:  CT chest from 11/28/2021 FINDINGS: CT CHEST FINDINGS Cardiovascular: Heart size is normal. Aortic atherosclerosis. Coronary artery calcifications. No pericardial effusion. Mediastinum/Nodes: Asymmetric heterogeneous enlargement of the left lobe of thyroid gland. This has been evaluated on previous imaging. (ref: J Am Coll Radiol. 2015 Feb;12(2): 143-50). Trachea appears patent and midline. Normal appearance of the esophagus. No mediastinal or axillary adenopathy. Hilar structures are suboptimally evaluated due to lack of IV contrast. Lungs/Pleura: Central airways appear patent. No pleural effusion or airspace consolidation. Mild to moderate emphysema. Status post left lower lobectomy. Scattered lung nodules are identified, including: -within the periphery of the right lower lobe there is an irregular nodule measuring 1 cm, image 63/4. Previously this measured 0.9 cm. -Sub solid nodule in the posterior left lower lung measures 0.8 cm, image 93/4. Formally 0.6 cm. -Subpleural nodular density over the posterolateral left lower lung measures 1.2 x 0.5 cm, image 95/4. Previously this measured the same. Musculoskeletal: Status post ACDF within the cervical spine. No acute or suspicious bone lesions. No chest wall mass. CT ABDOMEN PELVIS FINDINGS Hepatobiliary: There are 2 small low-attenuation foci within the left lobe of liver and posteromedial right lobe of liver measuring up to 8 mm. These are technically too small to reliably characterize but appear  unchanged. Gallbladder is unremarkable. Pancreas: Unremarkable. No pancreatic ductal dilatation or surrounding inflammatory changes. Spleen: Normal in size without focal abnormality. Adrenals/Urinary Tract: Normal appearance of the adrenal glands. No nephrolithiasis or hydronephrosis. Bilateral simple appearing kidney cysts are noted. The largest is in the lateral cortex of the right kidney measuring 3.6 cm, image 65/3. No follow-up imaging recommended. No hydronephrosis identified bilaterally. Urinary bladder appears normal. Stomach/Bowel: Small hiatal hernia. Stomach is nondistended. The appendix is visualized and appears normal. No pathologic dilatation of the large or small bowel loops. Distal colonic diverticula noted without signs of acute diverticulitis. Vascular/Lymphatic: Aortic atherosclerosis. No aneurysm. No signs of abdominopelvic adenopathy. Reproductive: Partially calcified fibroid is noted anteriorly measuring 1.9 cm. No adnexal mass identified. Other: No ascites or focal fluid collections. No signs of peritoneal nodularity or mass. Musculoskeletal: Previous PLIF at L4-5. Degenerative disc disease is noted at the remaining lumbar spine levels. Bilateral facet arthropathy. No acute or suspicious osseous findings. IMPRESSION: 1. Several bilateral lung nodules are again noted. There has been no significant change in the size of these nodules (within 1-2 mm). No new pulmonary lesions identified when compared with previous exam. 2. No evidence for metastatic disease to the abdomen or pelvis. 3. Coronary artery calcifications. 4. Aortic Atherosclerosis (ICD10-I70.0) and Emphysema (ICD10-J43.9). Electronically Signed   By: Signa Kell M.D.   On: 05/06/2022 13:56   CT ABDOMEN PELVIS WO CONTRAST  Result Date: 05/06/2022 CLINICAL DATA:  History of lung cancer. New hemorrhagic mass in the cerebellum. Restage lung cancer. * Tracking Code: BO * EXAM: CT CHEST, ABDOMEN AND PELVIS WITHOUT CONTRAST TECHNIQUE:  Multidetector CT imaging of the chest, abdomen and pelvis was performed following the standard protocol without IV contrast. RADIATION  DOSE REDUCTION: This exam was performed according to the departmental dose-optimization program which includes automated exposure control, adjustment of the mA and/or kV according to patient size and/or use of iterative reconstruction technique. COMPARISON:  CT chest from 11/28/2021 FINDINGS: CT CHEST FINDINGS Cardiovascular: Heart size is normal. Aortic atherosclerosis. Coronary artery calcifications. No pericardial effusion. Mediastinum/Nodes: Asymmetric heterogeneous enlargement of the left lobe of thyroid gland. This has been evaluated on previous imaging. (ref: J Am Coll Radiol. 2015 Feb;12(2): 143-50). Trachea appears patent and midline. Normal appearance of the esophagus. No mediastinal or axillary adenopathy. Hilar structures are suboptimally evaluated due to lack of IV contrast. Lungs/Pleura: Central airways appear patent. No pleural effusion or airspace consolidation. Mild to moderate emphysema. Status post left lower lobectomy. Scattered lung nodules are identified, including: -within the periphery of the right lower lobe there is an irregular nodule measuring 1 cm, image 63/4. Previously this measured 0.9 cm. -Sub solid nodule in the posterior left lower lung measures 0.8 cm, image 93/4. Formally 0.6 cm. -Subpleural nodular density over the posterolateral left lower lung measures 1.2 x 0.5 cm, image 95/4. Previously this measured the same. Musculoskeletal: Status post ACDF within the cervical spine. No acute or suspicious bone lesions. No chest wall mass. CT ABDOMEN PELVIS FINDINGS Hepatobiliary: There are 2 small low-attenuation foci within the left lobe of liver and posteromedial right lobe of liver measuring up to 8 mm. These are technically too small to reliably characterize but appear unchanged. Gallbladder is unremarkable. Pancreas: Unremarkable. No pancreatic  ductal dilatation or surrounding inflammatory changes. Spleen: Normal in size without focal abnormality. Adrenals/Urinary Tract: Normal appearance of the adrenal glands. No nephrolithiasis or hydronephrosis. Bilateral simple appearing kidney cysts are noted. The largest is in the lateral cortex of the right kidney measuring 3.6 cm, image 65/3. No follow-up imaging recommended. No hydronephrosis identified bilaterally. Urinary bladder appears normal. Stomach/Bowel: Small hiatal hernia. Stomach is nondistended. The appendix is visualized and appears normal. No pathologic dilatation of the large or small bowel loops. Distal colonic diverticula noted without signs of acute diverticulitis. Vascular/Lymphatic: Aortic atherosclerosis. No aneurysm. No signs of abdominopelvic adenopathy. Reproductive: Partially calcified fibroid is noted anteriorly measuring 1.9 cm. No adnexal mass identified. Other: No ascites or focal fluid collections. No signs of peritoneal nodularity or mass. Musculoskeletal: Previous PLIF at L4-5. Degenerative disc disease is noted at the remaining lumbar spine levels. Bilateral facet arthropathy. No acute or suspicious osseous findings. IMPRESSION: 1. Several bilateral lung nodules are again noted. There has been no significant change in the size of these nodules (within 1-2 mm). No new pulmonary lesions identified when compared with previous exam. 2. No evidence for metastatic disease to the abdomen or pelvis. 3. Coronary artery calcifications. 4. Aortic Atherosclerosis (ICD10-I70.0) and Emphysema (ICD10-J43.9). Electronically Signed   By: Signa Kell M.D.   On: 05/06/2022 13:56   MR BRAIN WO CONTRAST  Result Date: 05/06/2022 CLINICAL DATA:  Memory impairment.  Recent headaches and imbalance EXAM: MRI HEAD WITHOUT CONTRAST TECHNIQUE: Multiplanar, multiecho pulse sequences of the brain and surrounding structures were obtained without intravenous contrast. COMPARISON:  10/16/2005 FINDINGS:  Brain: Isointense mass with hemorrhagic features (hemosiderin at its inferior periphery) measuring up to 2.7 cm. Moderate vasogenic edema in the mass causes effacement of the lower fourth ventricle with third and lateral ventriculomegaly. Mild periventricular FLAIR hyperintensity at the lateral ventricles which is more likely chronic small vessel ischemia than transependymal flow given incomplete appearance in the degree of ventriculomegaly. Gliosis and volume loss at the anterior  left temporal lobe which was also seen on prior, possibly posttraumatic in this location. Case discussed with the patient's daughter who is driving the patient and ER referral has been made. [facility] made aware. Vascular: Major flow voids are preserved Skull and upper cervical spine: Normal marrow signal. Sinuses/Orbits: No significant finding. Other: Small proteinaceous cystic intensity anterior to the hyoid, consistent with thyroglossal duct cyst measuring 1 cm. IMPRESSION: Nearly 3 cm hemorrhagic mass with vasogenic edema in the lower cerebellum. There is effacement of the fourth ventricle with ventriculomegaly. Presumed metastasis in this patient with history malignancy, recommend postcontrast assessment. Electronically Signed   By: Tiburcio Pea M.D.   On: 05/06/2022 09:48       CNS Oncologic History 06/05/22: Completes fractionated SRS to cerebellar metastasis [facility])  Interval History: FAIREN DELISIO presents today for follow up.  She feels improved with regards to her balance since she started the decadron.  Now down to 4mg  twice per day.  No side effects from the treatment so far.  No other new or progressive changes.  Scheduled to complete radiation today.  H+P (05/08/22) Patient presents today to review recent MRI, CNS complaints.  She describes several weeks history of headaches, new from prior.  She attributes them to chemicals sprayed in her house for fleas and bugs, noting that the headache actually resolved  once the spray was discontinued last week.  PCP ordered a brain MRI, which demonstrated an enhancing mass in the posterior fossa.  She denies any issues with balance, gait is independent.  No other systemic complains, hasn't followed up regarding history of lung cancer since 2020.  Medications: Current Outpatient Medications on File Prior to Visit  Medication Sig Dispense Refill   acetaminophen (TYLENOL) 500 MG tablet Take 1 tablet (500 mg total) by mouth every 6 (six) hours as needed. 30 tablet 0   atorvastatin (LIPITOR) 10 MG tablet TAKE 1 TABLET (10 MG TOTAL) BY MOUTH DAILY AT 6 PM. 90 tablet 3   chlorthalidone (HYGROTON) 25 MG tablet TAKE 1 TABLET BY MOUTH EVERY DAY 90 tablet 3   Cholecalciferol (VITAMIN D-3) 1000 units CAPS Take 1 capsule (1,000 Units total) by mouth daily. 90 capsule 1   clopidogrel (PLAVIX) 75 MG tablet TAKE 1 TABLET BY MOUTH EVERY DAY WITH BREAKFAST 90 tablet 2   dexamethasone (DECADRON) 4 MG tablet Take with food. Take 1 tablet TID initially. Then, on 5/13, taper to 1 tablet BID. On 5/27, taper to 1 tablet daily. On 6/17 taper to 1/2 tablet daily. Last dose: 7/5. 45 tablet 1   gabapentin (NEURONTIN) 300 MG capsule Take 300 mg by mouth daily.     LORazepam (ATIVAN) 0.5 MG tablet Take 1 tablet (0.5 mg total) by mouth 2 (two) times daily as needed for anxiety. 4 tablet 0   magnesium oxide (MAG-OX) 400 MG tablet Take 1 tablet (400 mg total) by mouth daily. 90 tablet 1   methocarbamol (ROBAXIN) 500 MG tablet TAKE 1 TABLET BY MOUTH AT BEDTIME AS NEEDED FOR MUSCLE SPASMS. 30 tablet 2   metoprolol succinate (TOPROL-XL) 25 MG 24 hr tablet TAKE 1 TABLET (25 MG) BY ORAL ROUTE TWICE A DAY 180 tablet 3   Multiple Vitamin (MULTIVITAMIN) tablet Take 1 tablet by mouth at bedtime.      telmisartan (MICARDIS) 40 MG tablet TAKE 1 TABLET BY MOUTH EVERY DAY 90 tablet 3   No current facility-administered medications on file prior to visit.    Allergies:  Allergies  Allergen  Reactions    Aspirin Other (See Comments)    REACTION: stomach upset   Lactose Intolerance (Gi) Diarrhea and Other (See Comments)    "can't drink milk. It tears my stomach up."   Adhesive [Tape] Rash   Lisinopril Other (See Comments)    cough   Percocet [Oxycodone-Acetaminophen] Itching   Past Medical History:  Past Medical History:  Diagnosis Date   Arthritis    Benign hypertensive kidney disease with chronic kidney disease stage I through stage IV, or unspecified(403.10)    sees dr. Hyman Hopes again in april   Cancer [facility])    lung ca dx'd 02/2013   Chronic kidney disease, stage III (moderate) (HCC)    Cough 02/26/2013   GERD (gastroesophageal reflux disease)    Headache    Hypertension    Hyperthyroidism    took iodine treatment for this   Lung mass 02/27/2013   Neurologic disorder    2009   Shortness of breath    exertion   Thrombocytopenia, unspecified (HCC) 02/26/2013   Unspecified vitamin D deficiency    Past Surgical History:  Past Surgical History:  Procedure Laterality Date   BACK SURGERY     EYE SURGERY     cataract   EYE SURGERY Left    open up tear duct   LYMPH NODE DISSECTION Left 04/07/2013   Procedure: LYMPH NODE DISSECTION;  Surgeon: Delight Ovens, MD;  Location: [facility] OR;  Service: Thoracic;  Laterality: Left;   NECK SURGERY     PORT-A-CATH REMOVAL Left 01/09/2014   Procedure: REMOVAL PORT-A-CATH;  Surgeon: Delight Ovens, MD;  Location: [facility] OR;  Service: Thoracic;  Laterality: Left;   PORTACATH PLACEMENT Left 05/06/2013   Procedure: INSERTION PORT-A-CATH;  Surgeon: Delight Ovens, MD;  Location: [facility] OR;  Service: Thoracic;  Laterality: Left;   VIDEO ASSISTED THORACOSCOPY (VATS)/ LOBECTOMY Left 04/07/2013   Procedure: VIDEO ASSISTED THORACOSCOPY (VATS)/ LOBECTOMY; INSERTION OF ON-Q PUMP;  Surgeon: Delight Ovens, MD;  Location: [facility] OR;  Service: Thoracic;  Laterality: Left;   VIDEO BRONCHOSCOPY N/A 04/07/2013   Procedure: VIDEO BRONCHOSCOPY;  Surgeon: Delight Ovens,  MD;  Location: [facility] OR;  Service: Thoracic;  Laterality: N/A;   Social History:  Social History   Socioeconomic History   Marital status: Single    Spouse name: Not on file   Number of children: 2   Years of education: 4   Highest education level: Not on file  Occupational History   Not on file  Tobacco Use   Smoking status: Former    Packs/day: 0.50    Years: 60.00    Additional pack years: 0.00    Total pack years: 30.00    Types: Cigarettes    Quit date: 02/25/2012    Years since quitting: 10.2   Smokeless tobacco: Never  Vaping Use   Vaping Use: Never used  Substance and Sexual Activity   Alcohol use: No   Drug use: No   Sexual activity: Not on file  Other Topics Concern   Not on file  Social History Narrative   Right handed   Drinks caffeine   One story home   Lives alone   retired   Chief Executive Officer Determinants of Health   Financial Resource Strain: Low Risk  (11/11/2020)   Overall Financial Resource Strain (CARDIA)    Difficulty of Paying Living Expenses: Not hard at all  Food Insecurity: No Food Insecurity (05/19/2022)   Hunger Vital Sign    Worried About Running  Out of Food in the Last Year: Never true    Ran Out of Food in the Last Year: Never true  Transportation Needs: No Transportation Needs (05/08/2022)   PRAPARE - Administrator, Civil Service (Medical): No    Lack of Transportation (Non-Medical): No  Physical Activity: Sufficiently Active (11/11/2020)   Exercise Vital Sign    Days of Exercise per Week: 5 days    Minutes of Exercise per Session: 30 min  Stress: No Stress Concern Present (11/11/2020)   [employer] of Occupational Health - Occupational Stress Questionnaire    Feeling of Stress : Not at all  Social Connections: Moderately Integrated (11/11/2020)   Social Connection and Isolation Panel [NHANES]    Frequency of Communication with Friends and Family: More than three times a week    Frequency of Social Gatherings with Friends and  Family: More than three times a week    Attends Religious Services: More than 4 times per year    Active Member of [employer] or Organizations: Yes    Attends Engineer, structural: More than 4 times per year    Marital Status: Never married  Intimate Partner Violence: Not At Risk (05/19/2022)   Humiliation, Afraid, Rape, and Kick questionnaire    Fear of Current or Ex-Partner: No    Emotionally Abused: No    Physically Abused: No    Sexually Abused: No   Family History:  Family History  Problem Relation Age of Onset   Breast cancer Mother    Lung cancer Father        smoked   Lung cancer Brother        smoked   Rectal cancer Neg Hx    Stomach cancer Neg Hx    Esophageal cancer Neg Hx    Colon cancer Neg Hx     Review of Systems: Constitutional: Doesn't report fevers, chills or abnormal weight loss Eyes: Doesn't report blurriness of vision Ears, nose, mouth, throat, and face: Doesn't report sore throat Respiratory: Doesn't report cough, dyspnea or wheezes Cardiovascular: Doesn't report palpitation, chest discomfort  Gastrointestinal:  Doesn't report nausea, constipation, diarrhea GU: Doesn't report incontinence Skin: Doesn't report skin rashes Neurological: Per HPI Musculoskeletal: Doesn't report joint pain Behavioral/Psych: Doesn't report anxiety  Physical Exam: There were no vitals filed for this visit.  KPS: 80. General: Alert, cooperative, pleasant, in no acute distress Head: Normal EENT: No conjunctival injection or scleral icterus.  Lungs: Resp effort normal Cardiac: Regular rate Abdomen: Non-distended abdomen Skin: No rashes cyanosis or petechiae. Extremities: No clubbing or edema  Neurologic Exam: Mental Status: Awake, alert, attentive to examiner. Oriented to self and environment. Language is fluent with intact comprehension.  Cranial Nerves: Visual acuity is grossly normal. Visual fields are full. Extra-ocular movements intact. No ptosis. Face is  symmetric Motor: Tone and bulk are normal. Power is full in both arms and legs. Reflexes are symmetric, no pathologic reflexes present.  Sensory: Intact to light touch Gait: Normal.   Labs: I have reviewed the data as listed    Component Value Date/Time   NA 140 05/06/2022 1126   NA 144 07/30/2018 1217   NA 144 05/29/2016 1119   K 3.9 05/06/2022 1126   K 4.4 05/29/2016 1119   CL 106 05/06/2022 1126   CO2 21 (L) 05/06/2022 1126   CO2 23 05/29/2016 1119   GLUCOSE 88 05/06/2022 1126   GLUCOSE 91 05/29/2016 1119   BUN 24 (H) 05/06/2022 1126   BUN  21 07/30/2018 1217   BUN 20.8 05/29/2016 1119   CREATININE 2.00 (H) 05/06/2022 1126   CREATININE 1.7 (H) 05/29/2016 1119   CALCIUM 9.7 05/06/2022 1126   CALCIUM 9.5 05/29/2016 1119   PROT 7.3 05/23/2021 1336   PROT 7.1 07/30/2018 1217   PROT 6.8 05/29/2016 1119   ALBUMIN 4.2 05/23/2021 1336   ALBUMIN 4.6 07/30/2018 1217   ALBUMIN 3.7 05/29/2016 1119   AST 24 05/23/2021 1336   AST 28 05/29/2016 1119   ALT 17 05/23/2021 1336   ALT 29 05/29/2016 1119   ALKPHOS 64 05/23/2021 1336   ALKPHOS 68 05/29/2016 1119   BILITOT 0.5 05/23/2021 1336   BILITOT 0.3 07/30/2018 1217   BILITOT 0.24 05/29/2016 1119   GFRNONAA 24 (L) 05/06/2022 1126   GFRAA 32 (L) 11/04/2018 1120   Lab Results  Component Value Date   WBC 7.5 05/06/2022   NEUTROABS 5.3 05/06/2022   HGB 11.7 (L) 05/06/2022   HCT 36.4 05/06/2022   MCV 95.0 05/06/2022   PLT 250 05/06/2022    Imaging:  NM PET Image Restage (PS) Skull Base to Thigh (F-18 FDG)  Result Date: 05/19/2022 CLINICAL DATA:  Subsequent treatment strategy for metastatic lung cancer. EXAM: NUCLEAR MEDICINE PET SKULL BASE TO THIGH TECHNIQUE: 6.84 mCi F-18 FDG was injected intravenously. Full-ring PET imaging was performed from the skull base to thigh after the radiotracer. CT data was obtained and used for attenuation correction and anatomic localization. Fasting blood glucose: 98 mg/dl COMPARISON:  CT  16/10/9602 FINDINGS: Mediastinal blood pool activity: SUV max 3.67 Liver activity: SUV max NA NECK: Mass within the cerebellum noted on MRI from 05/17/2022 is tracer avid within SUV max of 26.70. Incidental CT findings: No tracer avid cervical lymph nodes. Nodule in the left lobe of thyroid gland containing calcifications measures 2.1 cm. No significant increased radiotracer uptake identified within this nodule. This has been evaluated on previous imaging. (ref: J Am Coll Radiol. 2015 Feb;12(2): 143-50). CHEST: No tracer avid mediastinal, hilar or axillary nodes. There are no tracer avid pulmonary nodules identified. Subsolid nodule in the posterior left lower lobe measures 7 mm without significant tracer uptake, image 38/7. This is technically too small to reliably characterize however low by PET-CT. The irregular nodule within the periphery of the right upper lobe measuring 1 cm is stable from the previous exam without significant tracer uptake, image 20/7. Focal nodular thickening over the posterior lateral left lower lobe measures approximately 1.1 by 0.7 cm without significant tracer uptake, image 79/4. Incidental CT findings: Emphysema.Aortic atherosclerosis coronary artery calcifications. ABDOMEN/PELVIS: No abnormal tracer uptake identified within the liver, pancreas, spleen, or adrenal glands. Incidental CT findings: Aortic atherosclerosis. Small hiatal hernia. 3.7 cm right kidney cyst. No follow-up imaging recommended. SKELETON: No focal hypermetabolic activity to suggest skeletal metastasis. Incidental CT findings: None. IMPRESSION: 1. The mass within the cerebellum noted on MRI from 05/17/2022 is tracer avid with SUV max of 26.70. 2. No signs of tracer avid nodal metastasis or solid organ metastasis within the chest, abdomen or pelvis. 3. Stable small pulmonary nodules are identified without significant tracer uptake. 4. Coronary artery calcifications. 5.  Aortic Atherosclerosis (ICD10-I70.0).  Electronically Signed   By: Signa Kell M.D.   On: 05/19/2022 09:28   MR Brain W Wo Contrast  Result Date: 05/18/2022 CLINICAL DATA:  Brain/CNS neoplasm, staging 3T SRS Protocol for radiation treatment planning. EXAM: MRI HEAD WITHOUT AND WITH CONTRAST TECHNIQUE: Multiplanar, multiecho pulse sequences of the brain and surrounding structures were obtained  without and with intravenous contrast. CONTRAST:  6.5 mL Vueway. COMPARISON:  MRI brain 05/06/2022. FINDINGS: Brain: Unchanged 27 x 22 mm enhancing mass with peripheral hemosiderin staining and surrounding vasogenic edema centered within the medial aspect of the left cerebellar hemisphere (image 44 series 13). No new enhancing lesions. Unchanged mass effect on the inferior fourth ventricle. Stable size and configuration of the third and lateral ventricles. No acute infarct or acute hemorrhage. Unchanged encephalomalacia in the left anterior temporal lobe, likely secondary to prior trauma. Vascular: Normal flow voids and enhancement. Skull and upper cervical spine: Normal marrow signal and enhancement. Sinuses/Orbits: Unremarkable. Other: None. IMPRESSION: 1. Unchanged 27 mm enhancing mass in the left cerebellar hemisphere with surrounding vasogenic edema. No new enhancing lesions. 2. Stable size and configuration of the third and lateral ventricles. Electronically Signed   By: Orvan Falconer M.D.   On: 05/18/2022 11:18   MR BRAIN W CONTRAST  Result Date: 05/06/2022 CLINICAL DATA:  Brain metastases, unknown primary EXAM: MRI HEAD WITH CONTRAST TECHNIQUE: Multiplanar, multiecho pulse sequences of the brain and surrounding structures were obtained with intravenous contrast. CONTRAST:  6.3mL GADAVIST GADOBUTROL 1 MMOL/ML IV SOLN COMPARISON:  Same day MRI head. FINDINGS: Postcontrast only imaging was performed to further evaluate the abnormality seen on same day noncontrast MRI. The mass within the left cerebellum is enhancing, compatible with a metastasis  given the clinical history. No other enhancing lesions identified. Please see same day MRI head for further characterization, including effacement of the fourth ventricle in ventriculomegaly. IMPRESSION: Postcontrast only imaging. The mass within the left cerebellum is enhancing, compatible with a metastasis given the clinical history. No other enhancing lesions identified. Electronically Signed   By: Feliberto Harts M.D.   On: 05/06/2022 15:17   CT Chest Wo Contrast  Result Date: 05/06/2022 CLINICAL DATA:  History of lung cancer. New hemorrhagic mass in the cerebellum. Restage lung cancer. * Tracking Code: BO * EXAM: CT CHEST, ABDOMEN AND PELVIS WITHOUT CONTRAST TECHNIQUE: Multidetector CT imaging of the chest, abdomen and pelvis was performed following the standard protocol without IV contrast. RADIATION DOSE REDUCTION: This exam was performed according to the departmental dose-optimization program which includes automated exposure control, adjustment of the mA and/or kV according to patient size and/or use of iterative reconstruction technique. COMPARISON:  CT chest from 11/28/2021 FINDINGS: CT CHEST FINDINGS Cardiovascular: Heart size is normal. Aortic atherosclerosis. Coronary artery calcifications. No pericardial effusion. Mediastinum/Nodes: Asymmetric heterogeneous enlargement of the left lobe of thyroid gland. This has been evaluated on previous imaging. (ref: J Am Coll Radiol. 2015 Feb;12(2): 143-50). Trachea appears patent and midline. Normal appearance of the esophagus. No mediastinal or axillary adenopathy. Hilar structures are suboptimally evaluated due to lack of IV contrast. Lungs/Pleura: Central airways appear patent. No pleural effusion or airspace consolidation. Mild to moderate emphysema. Status post left lower lobectomy. Scattered lung nodules are identified, including: -within the periphery of the right lower lobe there is an irregular nodule measuring 1 cm, image 63/4. Previously this  measured 0.9 cm. -Sub solid nodule in the posterior left lower lung measures 0.8 cm, image 93/4. Formally 0.6 cm. -Subpleural nodular density over the posterolateral left lower lung measures 1.2 x 0.5 cm, image 95/4. Previously this measured the same. Musculoskeletal: Status post ACDF within the cervical spine. No acute or suspicious bone lesions. No chest wall mass. CT ABDOMEN PELVIS FINDINGS Hepatobiliary: There are 2 small low-attenuation foci within the left lobe of liver and posteromedial right lobe of liver measuring up to 8  mm. These are technically too small to reliably characterize but appear unchanged. Gallbladder is unremarkable. Pancreas: Unremarkable. No pancreatic ductal dilatation or surrounding inflammatory changes. Spleen: Normal in size without focal abnormality. Adrenals/Urinary Tract: Normal appearance of the adrenal glands. No nephrolithiasis or hydronephrosis. Bilateral simple appearing kidney cysts are noted. The largest is in the lateral cortex of the right kidney measuring 3.6 cm, image 65/3. No follow-up imaging recommended. No hydronephrosis identified bilaterally. Urinary bladder appears normal. Stomach/Bowel: Small hiatal hernia. Stomach is nondistended. The appendix is visualized and appears normal. No pathologic dilatation of the large or small bowel loops. Distal colonic diverticula noted without signs of acute diverticulitis. Vascular/Lymphatic: Aortic atherosclerosis. No aneurysm. No signs of abdominopelvic adenopathy. Reproductive: Partially calcified fibroid is noted anteriorly measuring 1.9 cm. No adnexal mass identified. Other: No ascites or focal fluid collections. No signs of peritoneal nodularity or mass. Musculoskeletal: Previous PLIF at L4-5. Degenerative disc disease is noted at the remaining lumbar spine levels. Bilateral facet arthropathy. No acute or suspicious osseous findings. IMPRESSION: 1. Several bilateral lung nodules are again noted. There has been no significant  change in the size of these nodules (within 1-2 mm). No new pulmonary lesions identified when compared with previous exam. 2. No evidence for metastatic disease to the abdomen or pelvis. 3. Coronary artery calcifications. 4. Aortic Atherosclerosis (ICD10-I70.0) and Emphysema (ICD10-J43.9). Electronically Signed   By: Signa Kell M.D.   On: 05/06/2022 13:56   CT ABDOMEN PELVIS WO CONTRAST  Result Date: 05/06/2022 CLINICAL DATA:  History of lung cancer. New hemorrhagic mass in the cerebellum. Restage lung cancer. * Tracking Code: BO * EXAM: CT CHEST, ABDOMEN AND PELVIS WITHOUT CONTRAST TECHNIQUE: Multidetector CT imaging of the chest, abdomen and pelvis was performed following the standard protocol without IV contrast. RADIATION DOSE REDUCTION: This exam was performed according to the departmental dose-optimization program which includes automated exposure control, adjustment of the mA and/or kV according to patient size and/or use of iterative reconstruction technique. COMPARISON:  CT chest from 11/28/2021 FINDINGS: CT CHEST FINDINGS Cardiovascular: Heart size is normal. Aortic atherosclerosis. Coronary artery calcifications. No pericardial effusion. Mediastinum/Nodes: Asymmetric heterogeneous enlargement of the left lobe of thyroid gland. This has been evaluated on previous imaging. (ref: J Am Coll Radiol. 2015 Feb;12(2): 143-50). Trachea appears patent and midline. Normal appearance of the esophagus. No mediastinal or axillary adenopathy. Hilar structures are suboptimally evaluated due to lack of IV contrast. Lungs/Pleura: Central airways appear patent. No pleural effusion or airspace consolidation. Mild to moderate emphysema. Status post left lower lobectomy. Scattered lung nodules are identified, including: -within the periphery of the right lower lobe there is an irregular nodule measuring 1 cm, image 63/4. Previously this measured 0.9 cm. -Sub solid nodule in the posterior left lower lung measures 0.8  cm, image 93/4. Formally 0.6 cm. -Subpleural nodular density over the posterolateral left lower lung measures 1.2 x 0.5 cm, image 95/4. Previously this measured the same. Musculoskeletal: Status post ACDF within the cervical spine. No acute or suspicious bone lesions. No chest wall mass. CT ABDOMEN PELVIS FINDINGS Hepatobiliary: There are 2 small low-attenuation foci within the left lobe of liver and posteromedial right lobe of liver measuring up to 8 mm. These are technically too small to reliably characterize but appear unchanged. Gallbladder is unremarkable. Pancreas: Unremarkable. No pancreatic ductal dilatation or surrounding inflammatory changes. Spleen: Normal in size without focal abnormality. Adrenals/Urinary Tract: Normal appearance of the adrenal glands. No nephrolithiasis or hydronephrosis. Bilateral simple appearing kidney cysts are noted. The largest  is in the lateral cortex of the right kidney measuring 3.6 cm, image 65/3. No follow-up imaging recommended. No hydronephrosis identified bilaterally. Urinary bladder appears normal. Stomach/Bowel: Small hiatal hernia. Stomach is nondistended. The appendix is visualized and appears normal. No pathologic dilatation of the large or small bowel loops. Distal colonic diverticula noted without signs of acute diverticulitis. Vascular/Lymphatic: Aortic atherosclerosis. No aneurysm. No signs of abdominopelvic adenopathy. Reproductive: Partially calcified fibroid is noted anteriorly measuring 1.9 cm. No adnexal mass identified. Other: No ascites or focal fluid collections. No signs of peritoneal nodularity or mass. Musculoskeletal: Previous PLIF at L4-5. Degenerative disc disease is noted at the remaining lumbar spine levels. Bilateral facet arthropathy. No acute or suspicious osseous findings. IMPRESSION: 1. Several bilateral lung nodules are again noted. There has been no significant change in the size of these nodules (within 1-2 mm). No new pulmonary lesions  identified when compared with previous exam. 2. No evidence for metastatic disease to the abdomen or pelvis. 3. Coronary artery calcifications. 4. Aortic Atherosclerosis (ICD10-I70.0) and Emphysema (ICD10-J43.9). Electronically Signed   By: Signa Kell M.D.   On: 05/06/2022 13:56   MR BRAIN WO CONTRAST  Result Date: 05/06/2022 CLINICAL DATA:  Memory impairment.  Recent headaches and imbalance EXAM: MRI HEAD WITHOUT CONTRAST TECHNIQUE: Multiplanar, multiecho pulse sequences of the brain and surrounding structures were obtained without intravenous contrast. COMPARISON:  10/16/2005 FINDINGS: Brain: Isointense mass with hemorrhagic features (hemosiderin at its inferior periphery) measuring up to 2.7 cm. Moderate vasogenic edema in the mass causes effacement of the lower fourth ventricle with third and lateral ventriculomegaly. Mild periventricular FLAIR hyperintensity at the lateral ventricles which is more likely chronic small vessel ischemia than transependymal flow given incomplete appearance in the degree of ventriculomegaly. Gliosis and volume loss at the anterior left temporal lobe which was also seen on prior, possibly posttraumatic in this location. Case discussed with the patient's daughter who is driving the patient and ER referral has been made. [facility] made aware. Vascular: Major flow voids are preserved Skull and upper cervical spine: Normal marrow signal. Sinuses/Orbits: No significant finding. Other: Small proteinaceous cystic intensity anterior to the hyoid, consistent with thyroglossal duct cyst measuring 1 cm. IMPRESSION: Nearly 3 cm hemorrhagic mass with vasogenic edema in the lower cerebellum. There is effacement of the fourth ventricle with ventriculomegaly. Presumed metastasis in this patient with history malignancy, recommend postcontrast assessment. Electronically Signed   By: Tiburcio Pea M.D.   On: 05/06/2022 09:48      Assessment/Plan Malignant neoplasm metastatic to brain  [facility])  Laruen H Aarons is clinically stable today, now having completed SRS to posterior fossa metastasis.  We discussed her decadron taper schedule.  As written it will take her out an additional ~2 months with some coverage.  If focal symptoms recur, dose can be adjusted.  We appreciate the opportunity to participate in the care of CHIANTI PETSKA.  We ask that ERVA JARZABEK return to clinic in 3 months following next brain MRI, or sooner as needed.  All questions were answered. The patient knows to call the clinic with any problems, questions or concerns. No barriers to learning were detected.  The total time spent in the encounter was 30 minutes and more than 50% was on counseling and review of test results   Henreitta Leber, MD Medical Director of Neuro-Oncology [facility] [place] 06/05/22 9:29 AM

## 2022-06-05 NOTE — Progress Notes (Signed)
Patient to clinic for 15 minute observation SRS Brain.  Denies headache, fatigue, blurred vision, ringing in the ears, nausea, and no cognitive changes.  Speech clear and audible.  Currently on Decadron 4 mg taper.  Gait independently walked out of clinic without assistance.  Advised not to do anything strenuous for 24 hours and to call [phone] if develop symptoms.  Vitals [id number]/65 O2 sat 100%.

## 2022-06-06 ENCOUNTER — Ambulatory Visit: Payer: 59 | Admitting: Radiation Oncology

## 2022-06-07 ENCOUNTER — Ambulatory Visit (INDEPENDENT_AMBULATORY_CARE_PROVIDER_SITE_OTHER): Payer: 59 | Admitting: Emergency Medicine

## 2022-06-07 ENCOUNTER — Encounter: Payer: Self-pay | Admitting: Emergency Medicine

## 2022-06-07 VITALS — BP 130/66 | HR 55 | Temp 98.4°F | Ht 60.0 in | Wt 132.0 lb

## 2022-06-07 DIAGNOSIS — C7931 Secondary malignant neoplasm of brain: Secondary | ICD-10-CM

## 2022-06-07 DIAGNOSIS — I10 Essential (primary) hypertension: Secondary | ICD-10-CM | POA: Diagnosis not present

## 2022-06-07 DIAGNOSIS — I2510 Atherosclerotic heart disease of native coronary artery without angina pectoris: Secondary | ICD-10-CM

## 2022-06-07 DIAGNOSIS — I700 Atherosclerosis of aorta: Secondary | ICD-10-CM | POA: Diagnosis not present

## 2022-06-07 DIAGNOSIS — J439 Emphysema, unspecified: Secondary | ICD-10-CM

## 2022-06-07 NOTE — Assessment & Plan Note (Signed)
Well-controlled hypertension Continue chlorthalidone 25 mg daily with metoprolol succinate 25 mg daily and telmisartan 40 mg daily BP Readings from Last 3 Encounters:  06/07/22 130/66  06/05/22 136/65  06/01/22 119/66

## 2022-06-07 NOTE — Assessment & Plan Note (Signed)
No recent anginal episodes Continue Plavix 75 mg daily along with beta-blocker metoprolol succinate 25 mg daily

## 2022-06-07 NOTE — Patient Instructions (Signed)
Health Maintenance After Age 85 After age 85, you are at a higher risk for certain long-term diseases and infections as well as injuries from falls. Falls are a major cause of broken bones and head injuries in people who are older than age 85. Getting regular preventive care can help to keep you healthy and well. Preventive care includes getting regular testing and making lifestyle changes as recommended by your health care provider. Talk with your health care provider about: Which screenings and tests you should have. A screening is a test that checks for a disease when you have no symptoms. A diet and exercise plan that is right for you. What should I know about screenings and tests to prevent falls? Screening and testing are the best ways to find a health problem early. Early diagnosis and treatment give you the best chance of managing medical conditions that are common after age 85. Certain conditions and lifestyle choices may make you more likely to have a fall. Your health care provider may recommend: Regular vision checks. Poor vision and conditions such as cataracts can make you more likely to have a fall. If you wear glasses, make sure to get your prescription updated if your vision changes. Medicine review. Work with your health care provider to regularly review all of the medicines you are taking, including over-the-counter medicines. Ask your health care provider about any side effects that may make you more likely to have a fall. Tell your health care provider if any medicines that you take make you feel dizzy or sleepy. Strength and balance checks. Your health care provider may recommend certain tests to check your strength and balance while standing, walking, or changing positions. Foot health exam. Foot pain and numbness, as well as not wearing proper footwear, can make you more likely to have a fall. Screenings, including: Osteoporosis screening. Osteoporosis is a condition that causes  the bones to get weaker and break more easily. Blood pressure screening. Blood pressure changes and medicines to control blood pressure can make you feel dizzy. Depression screening. You may be more likely to have a fall if you have a fear of falling, feel depressed, or feel unable to do activities that you used to do. Alcohol use screening. Using too much alcohol can affect your balance and may make you more likely to have a fall. Follow these instructions at home: Lifestyle Do not drink alcohol if: Your health care provider tells you not to drink. If you drink alcohol: Limit how much you have to: 0-1 drink a day for women. 0-2 drinks a day for men. Know how much alcohol is in your drink. In the [place] one drink equals one 12 oz bottle of beer (355 mL), one 5 oz glass of wine (148 mL), or one 1 oz glass of hard liquor (44 mL). Do not use any products that contain nicotine or tobacco. These products include cigarettes, chewing tobacco, and vaping devices, such as e-cigarettes. If you need help quitting, ask your health care provider. Activity  Follow a regular exercise program to stay fit. This will help you maintain your balance. Ask your health care provider what types of exercise are appropriate for you. If you need a cane or walker, use it as recommended by your health care provider. Wear supportive shoes that have nonskid soles. Safety  Remove any tripping hazards, such as rugs, cords, and clutter. Install safety equipment such as grab bars in bathrooms and safety rails on stairs. Keep rooms and walkways   well-lit. General instructions Talk with your health care provider about your risks for falling. Tell your health care provider if: You fall. Be sure to tell your health care provider about all falls, even ones that seem minor. You feel dizzy, tiredness (fatigue), or off-balance. Take over-the-counter and prescription medicines only as told by your health care provider. These include  supplements. Eat a healthy diet and maintain a healthy weight. A healthy diet includes low-fat dairy products, low-fat (lean) meats, and fiber from whole grains, beans, and lots of fruits and vegetables. Stay current with your vaccines. Schedule regular health, dental, and eye exams. Summary Having a healthy lifestyle and getting preventive care can help to protect your health and wellness after age 85. Screening and testing are the best way to find a health problem early and help you avoid having a fall. Early diagnosis and treatment give you the best chance for managing medical conditions that are more common for people who are older than age 85. Falls are a major cause of broken bones and head injuries in people who are older than age 85. Take precautions to prevent a fall at home. Work with your health care provider to learn what changes you can make to improve your health and wellness and to prevent falls. This information is not intended to replace advice given to you by your health care provider. Make sure you discuss any questions you have with your health care provider. Document Revised: 05/31/2020 Document Reviewed: 05/31/2020 Elsevier Patient Education  2023 [employer]  

## 2022-06-07 NOTE — Assessment & Plan Note (Signed)
Stable.  Receiving radiation treatment.  Tolerating it well. No complications.

## 2022-06-07 NOTE — Progress Notes (Signed)
[name] 87 y.o.   Chief Complaint  Patient presents with   Medical Management of Chronic Issues    f/u appt, patient states she is having some constipation issues.     HISTORY OF PRESENT ILLNESS: This is a 87 y.o. female here for 42-month follow-up of chronic medical problems Recently diagnosed with metastatic brain lesions Has history of lung cancer in the past.  Recent PET scan unremarkable Overall doing well.  Occasional constipation issues No other complaints or medical concerns today.  HPI   Prior to Admission medications   Medication Sig Start Date End Date Taking? Authorizing Provider  acetaminophen (TYLENOL) 500 MG tablet Take 1 tablet (500 mg total) by mouth every 6 (six) hours as needed. 01/15/17  Yes Linus Mako B, NP  atorvastatin (LIPITOR) 10 MG tablet TAKE 1 TABLET (10 MG TOTAL) BY MOUTH DAILY AT 6 PM. 06/22/21  Yes Dezerae Freiberger, Eilleen Kempf, MD  chlorthalidone (HYGROTON) 25 MG tablet TAKE 1 TABLET BY MOUTH EVERY DAY 07/17/21  Yes Franciszek Platten, Eilleen Kempf, MD  Cholecalciferol (VITAMIN D-3) 1000 units CAPS Take 1 capsule (1,000 Units total) by mouth daily. 08/02/17  Yes Collie Siad A, MD  clopidogrel (PLAVIX) 75 MG tablet TAKE 1 TABLET BY MOUTH EVERY DAY WITH BREAKFAST 04/17/22  Yes Kathleena Freeman, Eilleen Kempf, MD  dexamethasone (DECADRON) 4 MG tablet Take with food. Take 1 tablet TID initially. Then, on 5/13, taper to 1 tablet BID. On 5/27, taper to 1 tablet daily. On 6/17 taper to 1/2 tablet daily. Last dose: 7/5. 05/19/22  Yes Lonie Peak, MD  gabapentin (NEURONTIN) 300 MG capsule Take 300 mg by mouth daily.   Yes [provider]  LORazepam (ATIVAN) 0.5 MG tablet Take 1 tablet (0.5 mg total) by mouth 2 (two) times daily as needed for anxiety. 05/08/22  Yes Gorsuch, Ni, MD  magnesium oxide (MAG-OX) 400 MG tablet Take 1 tablet (400 mg total) by mouth daily. 08/02/17  Yes Stallings, Zoe A, MD  methocarbamol (ROBAXIN) 500 MG tablet TAKE 1 TABLET BY MOUTH AT BEDTIME  AS NEEDED FOR MUSCLE SPASMS. 04/21/22  Yes Shantal Roan, Eilleen Kempf, MD  metoprolol succinate (TOPROL-XL) 25 MG 24 hr tablet TAKE 1 TABLET (25 MG) BY ORAL ROUTE TWICE A DAY 08/25/21  Yes Reshaun Briseno, Eilleen Kempf, MD  Multiple Vitamin (MULTIVITAMIN) tablet Take 1 tablet by mouth at bedtime.    Yes [provider]  telmisartan (MICARDIS) 40 MG tablet TAKE 1 TABLET BY MOUTH EVERY DAY 07/17/21  Yes Zylie Mumaw, Eilleen Kempf, MD    Allergies  Allergen Reactions   Aspirin Other (See Comments)    REACTION: stomach upset   Lactose Intolerance (Gi) Diarrhea and Other (See Comments)    "can't drink milk. It tears my stomach up."   Adhesive [Tape] Rash   Lisinopril Other (See Comments)    cough   Percocet [Oxycodone-Acetaminophen] Itching    Patient Active Problem List   Diagnosis Date Noted   Malignant neoplasm metastatic to brain (HCC) 05/08/2022   Memory impairment 04/12/2022   Hearing loss of right ear due to cerumen impaction 03/02/2022   Atherosclerosis of aorta (HCC) 06/14/2020   Pulmonary emphysema (HCC) 06/14/2020   Atherosclerosis of native coronary artery of native heart without angina pectoris 06/14/2020   Sacroiliac joint disease 08/02/2017   History of TIA (transient ischemic attack) 08/02/2017   Vitamin D deficiency 08/02/2017   Arthritis 08/02/2017   Long term current use of anticoagulant therapy 08/02/2017   Left thyroid nodule 01/01/2015   Benign  hypertension with CKD (chronic kidney disease) stage IV (HCC) 11/06/2013   Anemia in chronic kidney disease 05/28/2013   History of lung cancer 04/07/2013   Essential hypertension, benign 03/05/2013   Personal history of colonic polyps 10/29/2012    Past Medical History:  Diagnosis Date   Arthritis    Benign hypertensive kidney disease with chronic kidney disease stage I through stage IV, or unspecified(403.10)    sees dr. Hyman Hopes again in april   Cancer [facility])    lung ca dx'd 02/2013   Chronic kidney disease, stage III (moderate)  (HCC)    Cough 02/26/2013   GERD (gastroesophageal reflux disease)    Headache    Hypertension    Hyperthyroidism    took iodine treatment for this   Lung mass 02/27/2013   Neurologic disorder    2009   Shortness of breath    exertion   Thrombocytopenia, unspecified (HCC) 02/26/2013   Unspecified vitamin D deficiency     Past Surgical History:  Procedure Laterality Date   BACK SURGERY     EYE SURGERY     cataract   EYE SURGERY Left    open up tear duct   LYMPH NODE DISSECTION Left 04/07/2013   Procedure: LYMPH NODE DISSECTION;  Surgeon: Delight Ovens, MD;  Location: [facility] OR;  Service: Thoracic;  Laterality: Left;   NECK SURGERY     PORT-A-CATH REMOVAL Left 01/09/2014   Procedure: REMOVAL PORT-A-CATH;  Surgeon: Delight Ovens, MD;  Location: [facility] OR;  Service: Thoracic;  Laterality: Left;   PORTACATH PLACEMENT Left 05/06/2013   Procedure: INSERTION PORT-A-CATH;  Surgeon: Delight Ovens, MD;  Location: [facility] OR;  Service: Thoracic;  Laterality: Left;   VIDEO ASSISTED THORACOSCOPY (VATS)/ LOBECTOMY Left 04/07/2013   Procedure: VIDEO ASSISTED THORACOSCOPY (VATS)/ LOBECTOMY; INSERTION OF ON-Q PUMP;  Surgeon: Delight Ovens, MD;  Location: [facility] OR;  Service: Thoracic;  Laterality: Left;   VIDEO BRONCHOSCOPY N/A 04/07/2013   Procedure: VIDEO BRONCHOSCOPY;  Surgeon: Delight Ovens, MD;  Location: [facility] OR;  Service: Thoracic;  Laterality: N/A;    Social History   Socioeconomic History   Marital status: Single    Spouse name: Not on file   Number of children: 2   Years of education: 4   Highest education level: Not on file  Occupational History   Not on file  Tobacco Use   Smoking status: Former    Packs/day: 0.50    Years: 60.00    Additional pack years: 0.00    Total pack years: 30.00    Types: Cigarettes    Quit date: 02/25/2012    Years since quitting: 10.2   Smokeless tobacco: Never  Vaping Use   Vaping Use: Never used  Substance and Sexual Activity   Alcohol use: No    Drug use: No   Sexual activity: Not on file  Other Topics Concern   Not on file  Social History Narrative   Right handed   Drinks caffeine   One story home   Lives alone   retired   Chief Executive Officer Determinants of Health   Financial Resource Strain: Low Risk  (11/11/2020)   Overall Financial Resource Strain (CARDIA)    Difficulty of Paying Living Expenses: Not hard at all  Food Insecurity: No Food Insecurity (05/19/2022)   Hunger Vital Sign    Worried About Running Out of Food in the Last Year: Never true    Ran Out of Food in the Last Year: Never true  Transportation Needs: No Transportation Needs (05/08/2022)   PRAPARE - Administrator, Civil Service (Medical): No    Lack of Transportation (Non-Medical): No  Physical Activity: Sufficiently Active (11/11/2020)   Exercise Vital Sign    Days of Exercise per Week: 5 days    Minutes of Exercise per Session: 30 min  Stress: No Stress Concern Present (11/11/2020)   [employer] of Occupational Health - Occupational Stress Questionnaire    Feeling of Stress : Not at all  Social Connections: Moderately Integrated (11/11/2020)   Social Connection and Isolation Panel [NHANES]    Frequency of Communication with Friends and Family: More than three times a week    Frequency of Social Gatherings with Friends and Family: More than three times a week    Attends Religious Services: More than 4 times per year    Active Member of [employer] or Organizations: Yes    Attends Engineer, structural: More than 4 times per year    Marital Status: Never married  Intimate Partner Violence: Not At Risk (05/19/2022)   Humiliation, Afraid, Rape, and Kick questionnaire    Fear of Current or Ex-Partner: No    Emotionally Abused: No    Physically Abused: No    Sexually Abused: No    Family History  Problem Relation Age of Onset   Breast cancer Mother    Lung cancer Father        smoked   Lung cancer Brother        smoked   Rectal cancer  Neg Hx    Stomach cancer Neg Hx    Esophageal cancer Neg Hx    Colon cancer Neg Hx      Review of Systems  Constitutional: Negative.  Negative for chills and fever.  HENT: Negative.  Negative for congestion and sore throat.   Respiratory:  Negative for cough and shortness of breath.   Cardiovascular: Negative.  Negative for chest pain and palpitations.  Gastrointestinal:  Positive for constipation. Negative for nausea and vomiting.  Genitourinary: Negative.  Negative for dysuria and hematuria.  Skin: Negative.  Negative for rash.  Neurological: Negative.  Negative for dizziness and headaches.  All other systems reviewed and are negative.   Vitals:   06/07/22 1327  BP: 130/66  Pulse: (!) 55  Temp: 98.4 F (36.9 C)  SpO2: 98%    Physical Exam Vitals reviewed.  Constitutional:      Appearance: Normal appearance.  HENT:     Head: Normocephalic.  Eyes:     Extraocular Movements: Extraocular movements intact.     Pupils: Pupils are equal, round, and reactive to light.  Cardiovascular:     Rate and Rhythm: Normal rate and regular rhythm.     Pulses: Normal pulses.     Heart sounds: Normal heart sounds.  Pulmonary:     Effort: Pulmonary effort is normal.     Breath sounds: Normal breath sounds.  Abdominal:     Palpations: Abdomen is soft.     Tenderness: There is no abdominal tenderness.  Musculoskeletal:     Cervical back: No tenderness.     Right lower leg: No edema.     Left lower leg: No edema.  Lymphadenopathy:     Cervical: No cervical adenopathy.  Skin:    General: Skin is warm and dry.     Capillary Refill: Capillary refill takes less than 2 seconds.  Neurological:     Mental Status: She is alert and oriented  to person, place, and time.  Psychiatric:        Mood and Affect: Mood normal.        Behavior: Behavior normal.      ASSESSMENT & PLAN: A total of 44 minutes was spent with the patient and counseling/coordination of care regarding preparing for  this visit, review of most recent office visit notes, review of multiple chronic medical problems under management, review of all medications, review of most recent oncology office visit notes, review of most recent blood work results, documentation, prognosis, and need for follow-up.  Problem List Items Addressed This Visit       Cardiovascular and Mediastinum   Essential hypertension, benign - Primary    Well-controlled hypertension Continue chlorthalidone 25 mg daily with metoprolol succinate 25 mg daily and telmisartan 40 mg daily BP Readings from Last 3 Encounters:  06/07/22 130/66  06/05/22 136/65  06/01/22 119/66        Atherosclerosis of aorta (HCC)    Continue atorvastatin 10 mg daily.      Atherosclerosis of native coronary artery of native heart without angina pectoris    No recent anginal episodes Continue Plavix 75 mg daily along with beta-blocker metoprolol succinate 25 mg daily        Respiratory   Pulmonary emphysema (HCC)    Stable and asymptomatic        Nervous and Auditory   Malignant neoplasm metastatic to brain (HCC)    Stable.  Receiving radiation treatment.  Tolerating it well. No complications.      Patient Instructions  Health Maintenance After Age 16 After age 44, you are at a higher risk for certain long-term diseases and infections as well as injuries from falls. Falls are a major cause of broken bones and head injuries in people who are older than age 14. Getting regular preventive care can help to keep you healthy and well. Preventive care includes getting regular testing and making lifestyle changes as recommended by your health care provider. Talk with your health care provider about: Which screenings and tests you should have. A screening is a test that checks for a disease when you have no symptoms. A diet and exercise plan that is right for you. What should I know about screenings and tests to prevent falls? Screening and testing are  the best ways to find a health problem early. Early diagnosis and treatment give you the best chance of managing medical conditions that are common after age 52. Certain conditions and lifestyle choices may make you more likely to have a fall. Your health care provider may recommend: Regular vision checks. Poor vision and conditions such as cataracts can make you more likely to have a fall. If you wear glasses, make sure to get your prescription updated if your vision changes. Medicine review. Work with your health care provider to regularly review all of the medicines you are taking, including over-the-counter medicines. Ask your health care provider about any side effects that may make you more likely to have a fall. Tell your health care provider if any medicines that you take make you feel dizzy or sleepy. Strength and balance checks. Your health care provider may recommend certain tests to check your strength and balance while standing, walking, or changing positions. Foot health exam. Foot pain and numbness, as well as not wearing proper footwear, can make you more likely to have a fall. Screenings, including: Osteoporosis screening. Osteoporosis is a condition that causes the bones to get weaker  and break more easily. Blood pressure screening. Blood pressure changes and medicines to control blood pressure can make you feel dizzy. Depression screening. You may be more likely to have a fall if you have a fear of falling, feel depressed, or feel unable to do activities that you used to do. Alcohol use screening. Using too much alcohol can affect your balance and may make you more likely to have a fall. Follow these instructions at home: Lifestyle Do not drink alcohol if: Your health care provider tells you not to drink. If you drink alcohol: Limit how much you have to: 0-1 drink a day for women. 0-2 drinks a day for men. Know how much alcohol is in your drink. In the [place] one drink equals one  12 oz bottle of beer (355 mL), one 5 oz glass of wine (148 mL), or one 1 oz glass of hard liquor (44 mL). Do not use any products that contain nicotine or tobacco. These products include cigarettes, chewing tobacco, and vaping devices, such as e-cigarettes. If you need help quitting, ask your health care provider. Activity  Follow a regular exercise program to stay fit. This will help you maintain your balance. Ask your health care provider what types of exercise are appropriate for you. If you need a cane or walker, use it as recommended by your health care provider. Wear supportive shoes that have nonskid soles. Safety  Remove any tripping hazards, such as rugs, cords, and clutter. Install safety equipment such as grab bars in bathrooms and safety rails on stairs. Keep rooms and walkways well-lit. General instructions Talk with your health care provider about your risks for falling. Tell your health care provider if: You fall. Be sure to tell your health care provider about all falls, even ones that seem minor. You feel dizzy, tiredness (fatigue), or off-balance. Take over-the-counter and prescription medicines only as told by your health care provider. These include supplements. Eat a healthy diet and maintain a healthy weight. A healthy diet includes low-fat dairy products, low-fat (lean) meats, and fiber from whole grains, beans, and lots of fruits and vegetables. Stay current with your vaccines. Schedule regular health, dental, and eye exams. Summary Having a healthy lifestyle and getting preventive care can help to protect your health and wellness after age 6. Screening and testing are the best way to find a health problem early and help you avoid having a fall. Early diagnosis and treatment give you the best chance for managing medical conditions that are more common for people who are older than age 7. Falls are a major cause of broken bones and head injuries in people who are  older than age 41. Take precautions to prevent a fall at home. Work with your health care provider to learn what changes you can make to improve your health and wellness and to prevent falls. This information is not intended to replace advice given to you by your health care provider. Make sure you discuss any questions you have with your health care provider. Document Revised: 05/31/2020 Document Reviewed: 05/31/2020 Elsevier Patient Education  2023 Elsevier Inc.    Edwina Barth, MD [facility]

## 2022-06-07 NOTE — Assessment & Plan Note (Signed)
Stable and asymptomatic  

## 2022-06-07 NOTE — Assessment & Plan Note (Signed)
Continue atorvastatin 10 mg daily. 

## 2022-06-08 NOTE — Radiation Completion Notes (Signed)
Patient Name: [name], [name] MRN: [id number] Date of Birth: Jan 09, 1936 Referring Physician: Edwina Barth, M.D. Date of Service: 2022-06-08 Radiation Oncologist: Lonie Peak, M.D. [facility] [place]                             RADIATION ONCOLOGY END OF TREATMENT NOTE     Diagnosis: C79.31 Secondary malignant neoplasm of brain Staging on 2013-04-29: History of lung cancer T=T2a, N=N0, M=M1 Staging on 2013-04-09: History of lung cancer T=T2a, N=N0, M=cM0 Intent: Palliative     ==========DELIVERED PLANS==========  First Treatment Date: 2022-05-26 - Last Treatment Date: 2022-06-05   Plan Name: Brain_SRT Site: Brain Technique: SBRT/SRT-IMRT Mode: Photon Dose Per Fraction: 9 Gy Prescribed Dose (Delivered / Prescribed): 27 Gy / 27 Gy Prescribed Fxs (Delivered / Prescribed): 3 / 3     ==========ON TREATMENT VISIT DATES========== 2022-05-26, 2022-06-01, 2022-06-05     ==========UPCOMING VISITS==========       ==========APPENDIX - ON TREATMENT VISIT NOTES==========   See weekly On Treatment Notes is Epic for details.

## 2022-06-12 ENCOUNTER — Encounter: Payer: Self-pay | Admitting: Physician Assistant

## 2022-06-12 ENCOUNTER — Ambulatory Visit (INDEPENDENT_AMBULATORY_CARE_PROVIDER_SITE_OTHER): Payer: 59 | Admitting: Physician Assistant

## 2022-06-12 VITALS — BP 94/45 | HR 48 | Ht 60.0 in | Wt 131.0 lb

## 2022-06-12 DIAGNOSIS — R413 Other amnesia: Secondary | ICD-10-CM | POA: Diagnosis not present

## 2022-06-12 DIAGNOSIS — C7931 Secondary malignant neoplasm of brain: Secondary | ICD-10-CM | POA: Diagnosis not present
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
## 2022-06-12 NOTE — Patient Instructions (Addendum)
It was a pleasure to see you today at our office.   Recommendations:   If you have any severe symptoms of a stroke, or other severe issues such as confusion,severe chills or fever, etc call 911 or go to the ER as you may need to be evaluated further Follow up in 3 months  For psychiatric meds, mood meds: Please have your primary care physician manage these medications.  Consider [facility]  [address], [place] [address] [phone]  Hours of Operation Mondays to Thursdays: 8 am to 8 pm,Fridays: 9 am to 8 pm, Saturdays: 9 am to 1 pm Sundays: Closed  [email or web address]       RECOMMENDATIONS FOR ALL PATIENTS WITH MEMORY PROBLEMS: 1. Continue to exercise (Recommend 30 minutes of walking everyday, or 3 hours every week) 2. Increase social interactions - continue going to [place] and enjoy social gatherings with friends and family 3. Eat healthy, avoid fried foods and eat more fruits and vegetables 4. Maintain adequate blood pressure, blood sugar, and blood cholesterol level. Reducing the risk of stroke and cardiovascular disease also helps promoting better memory. 5. Avoid stressful situations. Live a simple life and avoid aggravations. Organize your time and prepare for the next day in anticipation. 6. Sleep well, avoid any interruptions of sleep and avoid any distractions in the bedroom that may interfere with adequate sleep quality 7. Avoid sugar, avoid sweets as there is a strong link between excessive sugar intake, diabetes, and cognitive impairment We discussed the Mediterranean diet, which has been shown to help patients reduce the risk of progressive memory disorders and reduces cardiovascular risk. This includes eating fish, eat fruits and green leafy vegetables, nuts like almonds and hazelnuts, walnuts, and also use olive oil. Avoid fast foods and fried foods as much as possible.  Avoid sweets and sugar as sugar use has been linked to worsening of memory function.  There is always a concern of gradual progression of memory problems. If this is the case, then we may need to adjust level of care according to patient needs. Support, both to the patient and caregiver, should then be put into place.         FALL PRECAUTIONS: Be cautious when walking. Scan the area for obstacles that may increase the risk of trips and falls. When getting up in the mornings, sit up at the edge of the bed for a few minutes before getting out of bed. Consider elevating the bed at the head end to avoid drop of blood pressure when getting up. Walk always in a well-lit room (use night lights in the walls). Avoid area rugs or power cords from appliances in the middle of the walkways. Use a walker or a cane if necessary and consider physical therapy for balance exercise. Get your eyesight checked regularly.  FINANCIAL OVERSIGHT: Supervision, especially oversight when making financial decisions or transactions is also recommended.  HOME SAFETY: Consider the safety of the kitchen when operating appliances like stoves, microwave oven, and blender. Consider having supervision and share cooking responsibilities until no longer able to participate in those. Accidents with firearms and other hazards in the house should be identified and addressed as well.   ABILITY TO BE LEFT ALONE: If patient is unable to contact 911 operator, consider using LifeLine, or when the need is there, arrange for someone to stay with patients. Smoking is a fire hazard, consider supervision or cessation. Risk of wandering should be assessed by caregiver and if detected at any  point, supervision and safe proof recommendations should be instituted.  MEDICATION SUPERVISION: Inability to self-administer medication needs to be constantly addressed. Implement a mechanism to ensure safe administration of the medications.   DRIVING:  Regarding driving, in patients with progressive memory problems, driving will be impaired. We advise to have someone else do the driving if trouble finding directions or if minor accidents are reported. Independent driving assessment is available to determine safety of driving.   If you are interested in the driving assessment, you can contact the following:  [employer] in [place] [phone]  [facility] [phone]  [facility] [phone] or [phone]    Mediterranean Diet A Mediterranean diet refers to food and lifestyle choices that are based on the traditions of countries located on the [employer]. This way of eating has been shown to help prevent certain conditions and improve outcomes for people who have chronic diseases, like kidney disease and heart disease. What are tips for following this plan? Lifestyle  Cook and eat meals together with your family, when possible. Drink enough fluid to keep your urine clear or pale yellow. Be physically active every day. This includes: Aerobic exercise like running or swimming. Leisure activities like gardening, walking, or housework. Get 7-8 hours of sleep each night. If recommended by your health care provider, drink red wine in moderation. This means 1 glass a day for nonpregnant women and 2 glasses a day for men. A glass of wine equals 5 oz (150 mL). Reading food labels  Check the serving size of packaged foods. For foods such as rice and pasta, the serving size refers to the amount of cooked product, not dry. Check the total fat in packaged foods. Avoid foods that have saturated fat or trans fats. Check the ingredients list for added sugars, such as corn syrup. Shopping  At the grocery store, buy most of your food from the areas near the walls of the store. This includes: Fresh fruits and vegetables (produce). Grains, beans, nuts, and seeds.  Some of these may be available in unpackaged forms or large amounts (in bulk). Fresh seafood. Poultry and eggs. Low-fat dairy products. Buy whole ingredients instead of prepackaged foods. Buy fresh fruits and vegetables in-season from local farmers markets. Buy frozen fruits and vegetables in resealable bags. If you do not have access to quality fresh seafood, buy precooked frozen shrimp or canned fish, such as tuna, salmon, or sardines. Buy small amounts of raw or cooked vegetables, salads, or olives from the deli or salad bar at your store. Stock your pantry so you always have certain foods on hand, such as olive oil, canned tuna, canned tomatoes, rice, pasta, and beans. Cooking  Cook foods with extra-virgin olive oil instead of using butter or other vegetable oils. Have meat as a side dish, and have vegetables or grains as your main dish. This means having meat in small portions or adding small amounts of meat to foods like pasta or stew. Use beans or vegetables instead of meat in common dishes like chili or lasagna. Experiment with different cooking methods. Try roasting or broiling vegetables instead of steaming or sauteing them. Add frozen vegetables to soups, stews, pasta, or rice. Add nuts or seeds for added healthy fat at each meal. You can add these to yogurt, salads, or vegetable dishes. Marinate fish or vegetables using olive oil, lemon juice, garlic, and fresh herbs. Meal planning  Plan to eat 1 vegetarian meal one day  each week. Try to work up to 2 vegetarian meals, if possible. Eat seafood 2 or more times a week. Have healthy snacks readily available, such as: Vegetable sticks with hummus. Greek yogurt. Fruit and nut trail mix. Eat balanced meals throughout the week. This includes: Fruit: 2-3 servings a day Vegetables: 4-5 servings a day Low-fat dairy: 2 servings a day Fish, poultry, or lean meat: 1 serving a day Beans and legumes: 2 or more servings a week Nuts and  seeds: 1-2 servings a day Whole grains: 6-8 servings a day Extra-virgin olive oil: 3-4 servings a day Limit red meat and sweets to only a few servings a month What are my food choices? Mediterranean diet Recommended Grains: Whole-grain pasta. Brown rice. Bulgar wheat. Polenta. Couscous. Whole-wheat bread. Orpah Cobb. Vegetables: Artichokes. Beets. Broccoli. Cabbage. Carrots. Eggplant. Green beans. Chard. Kale. Spinach. Onions. Leeks. Peas. Squash. Tomatoes. Peppers. Radishes. Fruits: Apples. Apricots. Avocado. Berries. Bananas. Cherries. Dates. Figs. Grapes. Lemons. Melon. Oranges. Peaches. Plums. Pomegranate. Meats and other protein foods: Beans. Almonds. Sunflower seeds. Pine nuts. Peanuts. Cod. Salmon. Scallops. Shrimp. Tuna. Tilapia. Clams. Oysters. Eggs. Dairy: Low-fat milk. Cheese. Greek yogurt. Beverages: Water. Red wine. Herbal tea. Fats and oils: Extra virgin olive oil. Avocado oil. Grape seed oil. Sweets and desserts: [place] yogurt with honey. Baked apples. Poached pears. Trail mix. Seasoning and other foods: Basil. Cilantro. Coriander. Cumin. Mint. Parsley. Sage. Rosemary. Tarragon. Garlic. Oregano. Thyme. Pepper. Balsalmic vinegar. Tahini. Hummus. Tomato sauce. Olives. Mushrooms. Limit these Grains: Prepackaged pasta or rice dishes. Prepackaged cereal with added sugar. Vegetables: Deep fried potatoes (french fries). Fruits: Fruit canned in syrup. Meats and other protein foods: Beef. Pork. Lamb. Poultry with skin. Hot dogs. Tomasa Blase. Dairy: Ice cream. Sour cream. Whole milk. Beverages: Juice. Sugar-sweetened soft drinks. Beer. Liquor and spirits. Fats and oils: Butter. Canola oil. Vegetable oil. Beef fat (tallow). Lard. Sweets and desserts: Cookies. Cakes. Pies. Candy. Seasoning and other foods: Mayonnaise. Premade sauces and marinades. The items listed may not be a complete list. Talk with your dietitian about what dietary choices are right for you. Summary The Mediterranean  diet includes both food and lifestyle choices. Eat a variety of fresh fruits and vegetables, beans, nuts, seeds, and whole grains. Limit the amount of red meat and sweets that you eat. Talk with your health care provider about whether it is safe for you to drink red wine in moderation. This means 1 glass a day for nonpregnant women and 2 glasses a day for men. A glass of wine equals 5 oz (150 mL). This information is not intended to replace advice given to you by your health care provider. Make sure you discuss any questions you have with your health care provider. Document Released: 09/02/2015 Document Revised: 10/05/2015 Document Reviewed: 09/02/2015 Elsevier Interactive Patient Education  2017 [employer].   Labs today [address] [place] [facility] [phone]

## 2022-06-14 NOTE — Progress Notes (Signed)
[name] presents today for follow up after completing SRS radiation treatment for brain cancer. She completed treatment on the 06-05-22.   Recent neurologic symptoms, if any:  Seizures: no Headaches: no Nausea: yes Dizziness/ataxia: yes, intermittent Difficulty with hand coordination: no Focal numbness/weakness: no Visual deficits/changes: no Confusion/Memory deficits: no  Other issues of note:  BP (!) 151/62 (BP Location: Left Arm, Patient Position: Sitting, Cuff Size: Normal)   Pulse 67   Temp 97.8 F (36.6 C)   Resp 20   Ht 5' (1.524 m)   Wt 138 lb 9.6 oz (62.9 kg)   SpO2 100%   BMI 27.07 kg/m

## 2022-06-19 ENCOUNTER — Other Ambulatory Visit: Payer: Self-pay | Admitting: Emergency Medicine

## 2022-06-19 DIAGNOSIS — Z8639 Personal history of other endocrine, nutritional and metabolic disease: Secondary | ICD-10-CM

## 2022-06-28 ENCOUNTER — Encounter: Payer: Self-pay | Admitting: Radiation Oncology

## 2022-06-28 ENCOUNTER — Ambulatory Visit
Admission: RE | Admit: 2022-06-28 | Discharge: 2022-06-28 | Disposition: A | Discharge status: Home / Self Care | Payer: 59 | Source: Ambulatory Visit | Attending: Radiation Oncology | Admitting: Radiation Oncology

## 2022-06-28 VITALS — BP 151/62 | HR 67 | Temp 97.8°F | Resp 20 | Ht 60.0 in | Wt 138.6 lb

## 2022-06-28 DIAGNOSIS — C3490 Malignant neoplasm of unspecified part of unspecified bronchus or lung: Secondary | ICD-10-CM | POA: Insufficient documentation

## 2022-06-28 DIAGNOSIS — Z7902 Long term (current) use of antithrombotics/antiplatelets: Secondary | ICD-10-CM | POA: Insufficient documentation

## 2022-06-28 DIAGNOSIS — C7931 Secondary malignant neoplasm of brain: Secondary | ICD-10-CM | POA: Diagnosis present

## 2022-06-28 DIAGNOSIS — Z79899 Other long term (current) drug therapy: Secondary | ICD-10-CM | POA: Insufficient documentation

## 2022-06-28 DIAGNOSIS — Z923 Personal history of irradiation: Secondary | ICD-10-CM | POA: Diagnosis not present

## 2022-06-29 ENCOUNTER — Encounter: Payer: Self-pay | Admitting: Radiation Oncology

## 2022-06-29 ENCOUNTER — Telehealth: Payer: Self-pay

## 2022-06-29 NOTE — Telephone Encounter (Signed)
Returned call the daughter. She saw Dr. Basilio Cairo yesterday, daughter came to appt but did not go back for visit. Daughter thought that she was still getting radiation.  Her Mom has been having facial swelling and leg/ feet swelling for about 2 days. She does not think that she is having any type of allergic reaction.  Recommendations?

## 2022-06-29 NOTE — Telephone Encounter (Signed)
Called back and offered appt at 3 pm today to see Dr. Bertis Ruddy, instructed to arrive 20 mins early. Daughter is sure if she can get transportation for appt. She ask that I call her back in 30 mins while she works on transportation.

## 2022-06-29 NOTE — Telephone Encounter (Signed)
Called daughter back. She spoke with her Mom and her Mom does not want to come today to appt. Transportation coordinator called daughter and they declined assistance today with appt. Instructed to go to the ER for worsening symptoms. Daughter verbalized.  FYI

## 2022-06-29 NOTE — Telephone Encounter (Signed)
Called and offered appt today at 3 pm. Daughter is unable to find transportation. She declined appt and will call the office back if she can find transportation. Sent referral to transportation program.

## 2022-06-29 NOTE — Progress Notes (Signed)
Radiation Oncology         [phone] ________________________________  Name: [name] MRN: [id number]  Date: 06/28/2022  DOB: 1935-04-03  Follow-Up Visit Note  Outpatient  CC: Cheryl Quint, MD  Cheryl Leber, MD  Diagnosis and Prior Radiotherapy:    ICD-10-CM   1. Malignant neoplasm metastatic to brain [facility]) [C79.31]  C79.31      First Treatment Date: 2022-05-26 - Last Treatment Date: 2022-06-05   Plan Name: Brain_SRT Site: Brain Technique: SBRT/SRT-IMRT Mode: Photon Dose Per Fraction: 9 Gy Prescribed Dose (Delivered / Prescribed): 27 Gy / 27 Gy Prescribed Fxs (Delivered / Prescribed): 3 / 3  CHIEF COMPLAINT: Here for follow-up and surveillance of metastatic cancer to brain  Narrative:  The patient returns today for routine follow-up.  [name] presents today for follow up after completing SRS radiation treatment for brain cancer. She completed treatment on the 06-05-22.   Recent neurologic symptoms, if any:  Seizures: no Headaches: no Nausea: yes Dizziness/ataxia: yes, intermittent Difficulty with hand coordination: no Focal numbness/weakness: no Visual deficits/changes: no Confusion/Memory deficits: no  Other issues of note: overall, feels well. Tapering dexamethasone but needs clarification on taper scheduled and is having trouble cutting tablets in half  BP (!) 151/62 (BP Location: Left Arm, Patient Position: Sitting, Cuff Size: Normal)   Pulse 67   Temp 97.8 F (36.6 C)   Resp 20   Ht 5' (1.524 m)   Wt 138 lb 9.6 oz (62.9 kg)   SpO2 100%   BMI 27.07 kg/m                               ALLERGIES:  is allergic to aspirin, lactose intolerance (gi), adhesive [tape], lisinopril, and percocet [oxycodone-acetaminophen].  Meds: Current Outpatient Medications  Medication Sig Dispense Refill   acetaminophen (TYLENOL) 500 MG tablet Take 1 tablet (500 mg total) by mouth every 6 (six) hours as needed. 30 tablet 0   atorvastatin (LIPITOR) 10 MG  tablet TAKE 1 TABLET (10 MG TOTAL) BY MOUTH DAILY AT 6 PM. 90 tablet 3   chlorthalidone (HYGROTON) 25 MG tablet TAKE 1 TABLET BY MOUTH EVERY DAY 90 tablet 3   Cholecalciferol (VITAMIN D-3) 1000 units CAPS Take 1 capsule (1,000 Units total) by mouth daily. 90 capsule 1   clopidogrel (PLAVIX) 75 MG tablet TAKE 1 TABLET BY MOUTH EVERY DAY WITH BREAKFAST 90 tablet 2   dexamethasone (DECADRON) 4 MG tablet Take with food. Take 1 tablet TID initially. Then, on 5/13, taper to 1 tablet BID. On 5/27, taper to 1 tablet daily. On 6/17 taper to 1/2 tablet daily. Last dose: 7/5. 45 tablet 1   gabapentin (NEURONTIN) 300 MG capsule Take 300 mg by mouth daily.     LORazepam (ATIVAN) 0.5 MG tablet Take 1 tablet (0.5 mg total) by mouth 2 (two) times daily as needed for anxiety. 4 tablet 0   magnesium oxide (MAG-OX) 400 MG tablet Take 1 tablet (400 mg total) by mouth daily. 90 tablet 1   methocarbamol (ROBAXIN) 500 MG tablet TAKE 1 TABLET BY MOUTH AT BEDTIME AS NEEDED FOR MUSCLE SPASMS. 30 tablet 2   metoprolol succinate (TOPROL-XL) 25 MG 24 hr tablet TAKE 1 TABLET (25 MG) BY ORAL ROUTE TWICE A DAY 180 tablet 3   Multiple Vitamin (MULTIVITAMIN) tablet Take 1 tablet by mouth at bedtime.      telmisartan (MICARDIS) 40 MG tablet TAKE 1 TABLET BY  MOUTH EVERY DAY 90 tablet 3   No current facility-administered medications for this encounter.    Physical Findings: The patient is in no acute distress. Patient is alert and oriented.  height is 5' (1.524 m) and weight is 138 lb 9.6 oz (62.9 kg). Her temperature is 97.8 F (36.6 C). Her blood pressure is 151/62 (abnormal) and her pulse is 67. Her respiration is 20 and oxygen saturation is 100%. .     General: Alert and oriented, in no acute distress HEENT: Head is normocephalic. Extraocular movements are intact. Oropharynx is clear. Musculoskeletal: symmetric strength and muscle tone throughout. Neurologic: Cranial nerves II through XII are grossly intact. No obvious  focalities. Speech is fluent. Coordination is intact. Psychiatric: Judgment and insight are intact. Affect is appropriate.  KPS = 80  100 - Normal; no complaints; no evidence of disease. 90   - Able to carry on normal activity; minor signs or symptoms of disease. 80   - Normal activity with effort; some signs or symptoms of disease. 58   - Cares for self; unable to carry on normal activity or to do active work. 60   - Requires occasional assistance, but is able to care for most of his personal needs. 50   - Requires considerable assistance and frequent medical care. 40   - Disabled; requires special care and assistance. 30   - Severely disabled; hospital admission is indicated although death not imminent. 20   - Very sick; hospital admission necessary; active supportive treatment necessary. 10   - Moribund; fatal processes progressing rapidly. 0     - Dead  Karnofsky DA, Abelmann WH, Craver LS and Burchenal [facility] [phone]) The use of the nitrogen mustards in the palliative treatment of carcinoma: with particular reference to bronchogenic carcinoma Cancer 1 [id number]  Lab Findings: Lab Results  Component Value Date   WBC 7.5 05/06/2022   HGB 11.7 (L) 05/06/2022   HCT 36.4 05/06/2022   MCV 95.0 05/06/2022   PLT 250 05/06/2022    Radiographic Findings: No results found.  Impression/Plan:  doing well after SRS to brain.  I handwrote instructions for her dexamethasone taper.  She knows it is okay to take 1 tablet every other day as opposed to 1/2 tablet daily at the end of her taper since it is hard for her to cut pills.  She is scheduled for MRI brain and f/u with Dr. Barbaraann Cao in 2 mo.  On date of service, in total, I spent 25 minutes on this encounter. Patient was seen in person.  Note signed after encounter date; minutes pertain to date of service, only.  _____________________________________   Lonie Peak, MD

## 2022-07-14 ENCOUNTER — Other Ambulatory Visit: Payer: Self-pay | Admitting: Emergency Medicine

## 2022-07-14 DIAGNOSIS — I129 Hypertensive chronic kidney disease with stage 1 through stage 4 chronic kidney disease, or unspecified chronic kidney disease: Secondary | ICD-10-CM

## 2022-08-28 ENCOUNTER — Other Ambulatory Visit: Payer: 59

## 2022-08-31 ENCOUNTER — Telehealth: Payer: Self-pay | Admitting: *Deleted

## 2022-08-31 ENCOUNTER — Inpatient Hospital Stay: Payer: 59 | Admitting: Internal Medicine

## 2022-08-31 ENCOUNTER — Telehealth: Payer: Self-pay | Admitting: Internal Medicine

## 2022-08-31 ENCOUNTER — Telehealth: Payer: Self-pay

## 2022-08-31 ENCOUNTER — Inpatient Hospital Stay: Payer: 59 | Admitting: Hematology and Oncology

## 2022-08-31 NOTE — Telephone Encounter (Signed)
Called and spoke with daughter. Rescheduled missed appt to 8/16 at 1 pm. Daughter is aware of appt.

## 2022-08-31 NOTE — Telephone Encounter (Signed)
Called to reschedule cancelled 08/08 appointments, spoke with patient's daughter. Appointments have been rescheduled.

## 2022-08-31 NOTE — Telephone Encounter (Signed)
Spoke with patients daughter to get MRI scheduled.  She stated that she would get the MRI scheduled and call back to schedule visit with Dr Barbaraann Cao.

## 2022-09-06 ENCOUNTER — Other Ambulatory Visit: Payer: Self-pay | Admitting: Radiation Therapy

## 2022-09-07 ENCOUNTER — Ambulatory Visit: Payer: 59 | Admitting: Hematology and Oncology

## 2022-09-08 ENCOUNTER — Inpatient Hospital Stay: Payer: 59 | Attending: Hematology and Oncology | Admitting: Hematology and Oncology

## 2022-09-08 ENCOUNTER — Encounter: Payer: Self-pay | Admitting: Hematology and Oncology

## 2022-09-08 VITALS — BP 154/70 | HR 85 | Resp 18 | Ht 60.0 in | Wt 134.8 lb

## 2022-09-08 DIAGNOSIS — I10 Essential (primary) hypertension: Secondary | ICD-10-CM | POA: Diagnosis not present

## 2022-09-08 DIAGNOSIS — N184 Chronic kidney disease, stage 4 (severe): Secondary | ICD-10-CM | POA: Diagnosis not present

## 2022-09-08 DIAGNOSIS — C7931 Secondary malignant neoplasm of brain: Secondary | ICD-10-CM

## 2022-09-08 DIAGNOSIS — I129 Hypertensive chronic kidney disease with stage 1 through stage 4 chronic kidney disease, or unspecified chronic kidney disease: Secondary | ICD-10-CM | POA: Insufficient documentation

## 2022-09-08 DIAGNOSIS — Z85118 Personal history of other malignant neoplasm of bronchus and lung: Secondary | ICD-10-CM | POA: Diagnosis present

## 2022-09-08 NOTE — Assessment & Plan Note (Signed)
She has no focal neurological deficits She has follow-up imaging study scheduled in the future as arranged by radiation oncologist She has completed dexamethasone taper without problems

## 2022-09-08 NOTE — Assessment & Plan Note (Signed)
She has stable chronic kidney disease stage IV She will continue risk factor modification Her blood pressure is grossly elevated during the clinic visit, likely exacerbated by anxiety According to the patient, her blood pressure monitoring at home is satisfactory

## 2022-09-08 NOTE — Assessment & Plan Note (Signed)
Her functional status is back to baseline Previous CT imaging and PET/CT imaging did not reviewed evidence of recurrent lung cancer Plan to order CT imaging of the chest without contrast in 2 weeks for further assessment

## 2022-09-08 NOTE — Progress Notes (Signed)
[facility] OFFICE PROGRESS NOTE  Patient Care Team: Georgina Quint, MD as PCP - General (Internal Medicine) Artis Delay, MD as Consulting Physician (Hematology and Oncology) Delight Ovens, MD (Inactive) as Consulting Physician (Cardiothoracic Surgery) Gelene Mink, OD as Referring Physician (Optometry) Gwynneth Munson, Corrie Dandy (Neurology)  ASSESSMENT & PLAN:  History of lung cancer Her functional status is back to baseline Previous CT imaging and PET/CT imaging did not reviewed evidence of recurrent lung cancer Plan to order CT imaging of the chest without contrast in 2 weeks for further assessment  Malignant neoplasm metastatic to brain [facility]) She has no focal neurological deficits She has follow-up imaging study scheduled in the future as arranged by radiation oncologist She has completed dexamethasone taper without problems  Essential hypertension, benign She has stable chronic kidney disease stage IV She will continue risk factor modification Her blood pressure is grossly elevated during the clinic visit, likely exacerbated by anxiety According to the patient, her blood pressure monitoring at home is satisfactory  Orders Placed This Encounter  Procedures   CT CHEST WO CONTRAST    Standing Status:   Future    Standing Expiration Date:   09/08/2023    Scheduling Instructions:     No IV contrast due to kidney failure    Order Specific Question:   Preferred imaging location?    Answer:   [facility]    Order Specific Question:   Radiology Contrast Protocol - do NOT remove file path    Answer:   [email or web address]    All questions were answered. The patient knows to call the clinic with any problems, questions or concerns. The total time spent in the appointment was 30 minutes encounter with patients including review of chart and various tests results, discussions about plan of care and coordination of care  plan   Artis Delay, MD 09/08/2022 1:22 PM  INTERVAL HISTORY: Please see below for problem oriented charting. she returns for surveillance follow-up after completion of radiation therapy for metastatic disease to the brain The last time I saw her, she was doing unwell Most recently, she is back to baseline.  She is active and mowing her yard Denies recent falls.  No recent seizures.  She has occasional headaches.  A few months ago, she noted facial swelling.  Today, her face appears normal I suspect her facial swelling was related to steroid induced fluid retention  REVIEW OF SYSTEMS:   Constitutional: Denies fevers, chills or abnormal weight loss Eyes: Denies blurriness of vision Ears, nose, mouth, throat, and face: Denies mucositis or sore throat Respiratory: Denies cough, dyspnea or wheezes Cardiovascular: Denies palpitation, chest discomfort or lower extremity swelling Gastrointestinal:  Denies nausea, heartburn or change in bowel habits Skin: Denies abnormal skin rashes Lymphatics: Denies new lymphadenopathy or easy bruising Neurological:Denies numbness, tingling or new weaknesses Behavioral/Psych: Mood is stable, no new changes  All other systems were reviewed with the patient and are negative.  I have reviewed the past medical history, past surgical history, social history and family history with the patient and they are unchanged from previous note.  ALLERGIES:  is allergic to aspirin, lactose intolerance (gi), adhesive [tape], lisinopril, and percocet [oxycodone-acetaminophen].  MEDICATIONS:  Current Outpatient Medications  Medication Sig Dispense Refill   acetaminophen (TYLENOL) 500 MG tablet Take 1 tablet (500 mg total) by mouth every 6 (six) hours as needed. 30 tablet 0   atorvastatin (LIPITOR) 10 MG tablet TAKE 1 TABLET (10 MG TOTAL) BY MOUTH  DAILY AT 6 PM. 90 tablet 3   chlorthalidone (HYGROTON) 25 MG tablet TAKE 1 TABLET BY MOUTH EVERY DAY 90 tablet 3   Cholecalciferol  (VITAMIN D-3) 1000 units CAPS Take 1 capsule (1,000 Units total) by mouth daily. 90 capsule 1   clopidogrel (PLAVIX) 75 MG tablet TAKE 1 TABLET BY MOUTH EVERY DAY WITH BREAKFAST 90 tablet 2   gabapentin (NEURONTIN) 300 MG capsule Take 300 mg by mouth daily.     LORazepam (ATIVAN) 0.5 MG tablet Take 1 tablet (0.5 mg total) by mouth 2 (two) times daily as needed for anxiety. 4 tablet 0   magnesium oxide (MAG-OX) 400 MG tablet Take 1 tablet (400 mg total) by mouth daily. 90 tablet 1   methocarbamol (ROBAXIN) 500 MG tablet TAKE 1 TABLET BY MOUTH AT BEDTIME AS NEEDED FOR MUSCLE SPASMS. 30 tablet 2   metoprolol succinate (TOPROL-XL) 25 MG 24 hr tablet TAKE 1 TABLET (25 MG) BY ORAL ROUTE TWICE A DAY 180 tablet 3   Multiple Vitamin (MULTIVITAMIN) tablet Take 1 tablet by mouth at bedtime.      telmisartan (MICARDIS) 40 MG tablet TAKE 1 TABLET BY MOUTH EVERY DAY 90 tablet 3   No current facility-administered medications for this visit.    SUMMARY OF ONCOLOGIC HISTORY: Oncology History Overview Note  Non-small cell lung cancer, adenocarcinoma   Primary site: Lung (Left)   Staging method: AJCC 7th Edition   Clinical: Stage IB (T2a, N0, M0) signed by Artis Delay, MD on 04/29/2013  9:01 PM   Pathologic: Stage IB (T2a, N0, cM0) signed by Delight Ovens, MD on 04/09/2013 12:51 PM   Summary: Stage IB (T2a, N0, cM0) Molecular studies done on sample from 2105 showed no actionable mutations. MSI stable, low tumor mutation burden    History of lung cancer  02/26/2013 Imaging   CXR for evaluation of cough showed new left lung mass   02/28/2013 Imaging   CT chest showed left new lung nodule   03/14/2013 Imaging   PET/CT scan showed localized disease in the left lung   04/07/2013 Surgery   She underwent bronchoscopy and left lower lobe resection and lymph node sampling with negative margins   05/06/2013 Procedure   The patient has placement of Infuse-a-Port   05/21/2013 - 07/30/2013 Chemotherapy   She  completed 4 cycles of adjuvant chemotherapy with navelbine and cisplatin   07/01/2013 Imaging   CT scan of the chest shows stable appearance.   07/02/2013 Adverse Reaction   Dose of chemotherapy was adjusted further due to severe anemia.   12/31/2013 Imaging   CT scan of the chest show no evidence of disease recurrence   07/03/2014 Imaging   Chest x-ray looks normal   12/31/2014 Imaging   Ct chest showed no evidence of recurrence   05/31/2015 Imaging   Status post resection of a left lower lobe lung lesion without findings for recurrent tumor or metastatic disease.    05/29/2016 Imaging   1. Stable exam. Status post left lower lobectomy without evidence for new or progressive findings. 2. Emphysema. 3. Thoracic aortic atherosclerosis   05/30/2017 Imaging   1. Slight interval increase in size of right upper lobe nodule. Recommend attention on follow-up. 2. Stable appearance of the left hemithorax with left lower lobectomy. 3. Aortic Atherosclerosis (ICD10-I70.0) and Emphysema (ICD10-J43.9).   11/13/2018 Imaging   1. No evidence of recurrent or metastatic disease. 2. Clustered peribronchovascular nodularity in the right upper lobe appears stable. 3. 7 mm left upper lobe  ground-glass nodule, stable. Continued attention on follow-up exams is warranted. 4. Aortic atherosclerosis (ICD10-170.0). Coronary artery calcification. 5.  Emphysema (ICD10-J43.9).   05/19/2022 PET scan   NM PET Image Restage (PS) Skull Base to Thigh (F-18 FDG)  Result Date: 05/19/2022 CLINICAL DATA:  Subsequent treatment strategy for metastatic lung cancer. EXAM: NUCLEAR MEDICINE PET SKULL BASE TO THIGH TECHNIQUE: 6.84 mCi F-18 FDG was injected intravenously. Full-ring PET imaging was performed from the skull base to thigh after the radiotracer. CT data was obtained and used for attenuation correction and anatomic localization. Fasting blood glucose: 98 mg/dl COMPARISON:  CT 16/10/9602 FINDINGS: Mediastinal blood pool  activity: SUV max 3.67 Liver activity: SUV max NA NECK: Mass within the cerebellum noted on MRI from 05/17/2022 is tracer avid within SUV max of 26.70. Incidental CT findings: No tracer avid cervical lymph nodes. Nodule in the left lobe of thyroid gland containing calcifications measures 2.1 cm. No significant increased radiotracer uptake identified within this nodule. This has been evaluated on previous imaging. (ref: J Am Coll Radiol. 2015 Feb;12(2): 143-50). CHEST: No tracer avid mediastinal, hilar or axillary nodes. There are no tracer avid pulmonary nodules identified. Subsolid nodule in the posterior left lower lobe measures 7 mm without significant tracer uptake, image 38/7. This is technically too small to reliably characterize however low by PET-CT. The irregular nodule within the periphery of the right upper lobe measuring 1 cm is stable from the previous exam without significant tracer uptake, image 20/7. Focal nodular thickening over the posterior lateral left lower lobe measures approximately 1.1 by 0.7 cm without significant tracer uptake, image 79/4. Incidental CT findings: Emphysema.Aortic atherosclerosis coronary artery calcifications. ABDOMEN/PELVIS: No abnormal tracer uptake identified within the liver, pancreas, spleen, or adrenal glands. Incidental CT findings: Aortic atherosclerosis. Small hiatal hernia. 3.7 cm right kidney cyst. No follow-up imaging recommended. SKELETON: No focal hypermetabolic activity to suggest skeletal metastasis. Incidental CT findings: None. IMPRESSION: 1. The mass within the cerebellum noted on MRI from 05/17/2022 is tracer avid with SUV max of 26.70. 2. No signs of tracer avid nodal metastasis or solid organ metastasis within the chest, abdomen or pelvis. 3. Stable small pulmonary nodules are identified without significant tracer uptake. 4. Coronary artery calcifications. 5.  Aortic Atherosclerosis (ICD10-I70.0). Electronically Signed   By: Signa Kell M.D.   On:  05/19/2022 09:28   MR Brain W Wo Contrast  Result Date: 05/18/2022 CLINICAL DATA:  Brain/CNS neoplasm, staging 3T SRS Protocol for radiation treatment planning. EXAM: MRI HEAD WITHOUT AND WITH CONTRAST TECHNIQUE: Multiplanar, multiecho pulse sequences of the brain and surrounding structures were obtained without and with intravenous contrast. CONTRAST:  6.5 mL Vueway. COMPARISON:  MRI brain 05/06/2022. FINDINGS: Brain: Unchanged 27 x 22 mm enhancing mass with peripheral hemosiderin staining and surrounding vasogenic edema centered within the medial aspect of the left cerebellar hemisphere (image 44 series 13). No new enhancing lesions. Unchanged mass effect on the inferior fourth ventricle. Stable size and configuration of the third and lateral ventricles. No acute infarct or acute hemorrhage. Unchanged encephalomalacia in the left anterior temporal lobe, likely secondary to prior trauma. Vascular: Normal flow voids and enhancement. Skull and upper cervical spine: Normal marrow signal and enhancement. Sinuses/Orbits: Unremarkable. Other: None. IMPRESSION: 1. Unchanged 27 mm enhancing mass in the left cerebellar hemisphere with surrounding vasogenic edema. No new enhancing lesions. 2. Stable size and configuration of the third and lateral ventricles. Electronically Signed   By: Orvan Falconer M.D.   On: 05/18/2022 11:18   MR  BRAIN W CONTRAST  Result Date: 05/06/2022 CLINICAL DATA:  Brain metastases, unknown primary EXAM: MRI HEAD WITH CONTRAST TECHNIQUE: Multiplanar, multiecho pulse sequences of the brain and surrounding structures were obtained with intravenous contrast. CONTRAST:  6.81mL GADAVIST GADOBUTROL 1 MMOL/ML IV SOLN COMPARISON:  Same day MRI head. FINDINGS: Postcontrast only imaging was performed to further evaluate the abnormality seen on same day noncontrast MRI. The mass within the left cerebellum is enhancing, compatible with a metastasis given the clinical history. No other enhancing lesions  identified. Please see same day MRI head for further characterization, including effacement of the fourth ventricle in ventriculomegaly. IMPRESSION: Postcontrast only imaging. The mass within the left cerebellum is enhancing, compatible with a metastasis given the clinical history. No other enhancing lesions identified. Electronically Signed   By: Feliberto Harts M.D.   On: 05/06/2022 15:17   CT Chest Wo Contrast  Result Date: 05/06/2022 CLINICAL DATA:  History of lung cancer. New hemorrhagic mass in the cerebellum. Restage lung cancer. * Tracking Code: BO * EXAM: CT CHEST, ABDOMEN AND PELVIS WITHOUT CONTRAST TECHNIQUE: Multidetector CT imaging of the chest, abdomen and pelvis was performed following the standard protocol without IV contrast. RADIATION DOSE REDUCTION: This exam was performed according to the departmental dose-optimization program which includes automated exposure control, adjustment of the mA and/or kV according to patient size and/or use of iterative reconstruction technique. COMPARISON:  CT chest from 11/28/2021 FINDINGS: CT CHEST FINDINGS Cardiovascular: Heart size is normal. Aortic atherosclerosis. Coronary artery calcifications. No pericardial effusion. Mediastinum/Nodes: Asymmetric heterogeneous enlargement of the left lobe of thyroid gland. This has been evaluated on previous imaging. (ref: J Am Coll Radiol. 2015 Feb;12(2): 143-50). Trachea appears patent and midline. Normal appearance of the esophagus. No mediastinal or axillary adenopathy. Hilar structures are suboptimally evaluated due to lack of IV contrast. Lungs/Pleura: Central airways appear patent. No pleural effusion or airspace consolidation. Mild to moderate emphysema. Status post left lower lobectomy. Scattered lung nodules are identified, including: -within the periphery of the right lower lobe there is an irregular nodule measuring 1 cm, image 63/4. Previously this measured 0.9 cm. -Sub solid nodule in the posterior left  lower lung measures 0.8 cm, image 93/4. Formally 0.6 cm. -Subpleural nodular density over the posterolateral left lower lung measures 1.2 x 0.5 cm, image 95/4. Previously this measured the same. Musculoskeletal: Status post ACDF within the cervical spine. No acute or suspicious bone lesions. No chest wall mass. CT ABDOMEN PELVIS FINDINGS Hepatobiliary: There are 2 small low-attenuation foci within the left lobe of liver and posteromedial right lobe of liver measuring up to 8 mm. These are technically too small to reliably characterize but appear unchanged. Gallbladder is unremarkable. Pancreas: Unremarkable. No pancreatic ductal dilatation or surrounding inflammatory changes. Spleen: Normal in size without focal abnormality. Adrenals/Urinary Tract: Normal appearance of the adrenal glands. No nephrolithiasis or hydronephrosis. Bilateral simple appearing kidney cysts are noted. The largest is in the lateral cortex of the right kidney measuring 3.6 cm, image 65/3. No follow-up imaging recommended. No hydronephrosis identified bilaterally. Urinary bladder appears normal. Stomach/Bowel: Small hiatal hernia. Stomach is nondistended. The appendix is visualized and appears normal. No pathologic dilatation of the large or small bowel loops. Distal colonic diverticula noted without signs of acute diverticulitis. Vascular/Lymphatic: Aortic atherosclerosis. No aneurysm. No signs of abdominopelvic adenopathy. Reproductive: Partially calcified fibroid is noted anteriorly measuring 1.9 cm. No adnexal mass identified. Other: No ascites or focal fluid collections. No signs of peritoneal nodularity or mass. Musculoskeletal: Previous PLIF at L4-5.  Degenerative disc disease is noted at the remaining lumbar spine levels. Bilateral facet arthropathy. No acute or suspicious osseous findings. IMPRESSION: 1. Several bilateral lung nodules are again noted. There has been no significant change in the size of these nodules (within 1-2 mm). No  new pulmonary lesions identified when compared with previous exam. 2. No evidence for metastatic disease to the abdomen or pelvis. 3. Coronary artery calcifications. 4. Aortic Atherosclerosis (ICD10-I70.0) and Emphysema (ICD10-J43.9). Electronically Signed   By: Signa Kell M.D.   On: 05/06/2022 13:56   CT ABDOMEN PELVIS WO CONTRAST  Result Date: 05/06/2022 CLINICAL DATA:  History of lung cancer. New hemorrhagic mass in the cerebellum. Restage lung cancer. * Tracking Code: BO * EXAM: CT CHEST, ABDOMEN AND PELVIS WITHOUT CONTRAST TECHNIQUE: Multidetector CT imaging of the chest, abdomen and pelvis was performed following the standard protocol without IV contrast. RADIATION DOSE REDUCTION: This exam was performed according to the departmental dose-optimization program which includes automated exposure control, adjustment of the mA and/or kV according to patient size and/or use of iterative reconstruction technique. COMPARISON:  CT chest from 11/28/2021 FINDINGS: CT CHEST FINDINGS Cardiovascular: Heart size is normal. Aortic atherosclerosis. Coronary artery calcifications. No pericardial effusion. Mediastinum/Nodes: Asymmetric heterogeneous enlargement of the left lobe of thyroid gland. This has been evaluated on previous imaging. (ref: J Am Coll Radiol. 2015 Feb;12(2): 143-50). Trachea appears patent and midline. Normal appearance of the esophagus. No mediastinal or axillary adenopathy. Hilar structures are suboptimally evaluated due to lack of IV contrast. Lungs/Pleura: Central airways appear patent. No pleural effusion or airspace consolidation. Mild to moderate emphysema. Status post left lower lobectomy. Scattered lung nodules are identified, including: -within the periphery of the right lower lobe there is an irregular nodule measuring 1 cm, image 63/4. Previously this measured 0.9 cm. -Sub solid nodule in the posterior left lower lung measures 0.8 cm, image 93/4. Formally 0.6 cm. -Subpleural nodular  density over the posterolateral left lower lung measures 1.2 x 0.5 cm, image 95/4. Previously this measured the same. Musculoskeletal: Status post ACDF within the cervical spine. No acute or suspicious bone lesions. No chest wall mass. CT ABDOMEN PELVIS FINDINGS Hepatobiliary: There are 2 small low-attenuation foci within the left lobe of liver and posteromedial right lobe of liver measuring up to 8 mm. These are technically too small to reliably characterize but appear unchanged. Gallbladder is unremarkable. Pancreas: Unremarkable. No pancreatic ductal dilatation or surrounding inflammatory changes. Spleen: Normal in size without focal abnormality. Adrenals/Urinary Tract: Normal appearance of the adrenal glands. No nephrolithiasis or hydronephrosis. Bilateral simple appearing kidney cysts are noted. The largest is in the lateral cortex of the right kidney measuring 3.6 cm, image 65/3. No follow-up imaging recommended. No hydronephrosis identified bilaterally. Urinary bladder appears normal. Stomach/Bowel: Small hiatal hernia. Stomach is nondistended. The appendix is visualized and appears normal. No pathologic dilatation of the large or small bowel loops. Distal colonic diverticula noted without signs of acute diverticulitis. Vascular/Lymphatic: Aortic atherosclerosis. No aneurysm. No signs of abdominopelvic adenopathy. Reproductive: Partially calcified fibroid is noted anteriorly measuring 1.9 cm. No adnexal mass identified. Other: No ascites or focal fluid collections. No signs of peritoneal nodularity or mass. Musculoskeletal: Previous PLIF at L4-5. Degenerative disc disease is noted at the remaining lumbar spine levels. Bilateral facet arthropathy. No acute or suspicious osseous findings. IMPRESSION: 1. Several bilateral lung nodules are again noted. There has been no significant change in the size of these nodules (within 1-2 mm). No new pulmonary lesions identified when compared with previous  exam. 2. No  evidence for metastatic disease to the abdomen or pelvis. 3. Coronary artery calcifications. 4. Aortic Atherosclerosis (ICD10-I70.0) and Emphysema (ICD10-J43.9). Electronically Signed   By: Signa Kell M.D.   On: 05/06/2022 13:56   MR BRAIN WO CONTRAST  Result Date: 05/06/2022 CLINICAL DATA:  Memory impairment.  Recent headaches and imbalance EXAM: MRI HEAD WITHOUT CONTRAST TECHNIQUE: Multiplanar, multiecho pulse sequences of the brain and surrounding structures were obtained without intravenous contrast. COMPARISON:  10/16/2005 FINDINGS: Brain: Isointense mass with hemorrhagic features (hemosiderin at its inferior periphery) measuring up to 2.7 cm. Moderate vasogenic edema in the mass causes effacement of the lower fourth ventricle with third and lateral ventriculomegaly. Mild periventricular FLAIR hyperintensity at the lateral ventricles which is more likely chronic small vessel ischemia than transependymal flow given incomplete appearance in the degree of ventriculomegaly. Gliosis and volume loss at the anterior left temporal lobe which was also seen on prior, possibly posttraumatic in this location. Case discussed with the patient's daughter who is driving the patient and ER referral has been made. [facility] made aware. Vascular: Major flow voids are preserved Skull and upper cervical spine: Normal marrow signal. Sinuses/Orbits: No significant finding. Other: Small proteinaceous cystic intensity anterior to the hyoid, consistent with thyroglossal duct cyst measuring 1 cm. IMPRESSION: Nearly 3 cm hemorrhagic mass with vasogenic edema in the lower cerebellum. There is effacement of the fourth ventricle with ventriculomegaly. Presumed metastasis in this patient with history malignancy, recommend postcontrast assessment. Electronically Signed   By: Tiburcio Pea M.D.   On: 05/06/2022 09:48      05/26/2022 - 06/05/2022 Radiation Therapy   First Treatment Date: 2022-05-26 - Last Treatment Date:  2022-06-05   Plan Name: Brain_SRT Site: Brain Technique: SBRT/SRT-IMRT Mode: Photon Dose Per Fraction: 9 Gy Prescribed Dose (Delivered / Prescribed): 27 Gy / 27 Gy Prescribed Fxs (Delivered / Prescribed): 3 / 3     PHYSICAL EXAMINATION: ECOG PERFORMANCE STATUS: 0 - Asymptomatic  Vitals:   09/08/22 1311  BP: (!) 154/70  Pulse: 85  Resp: 18  SpO2: 100%   Filed Weights   09/08/22 1311  Weight: 134 lb 12.8 oz (61.1 kg)    GENERAL:alert, no distress and comfortable SKIN: skin color, texture, turgor are normal, no rashes or significant lesions EYES: normal, Conjunctiva are pink and non-injected, sclera clear OROPHARYNX:no exudate, no erythema and lips, buccal mucosa, and tongue normal  NECK: supple, thyroid normal size, non-tender, without nodularity LYMPH:  no palpable lymphadenopathy in the cervical, axillary or inguinal LUNGS: clear to auscultation and percussion with normal breathing effort HEART: regular rate & rhythm and no murmurs and no lower extremity edema ABDOMEN:abdomen soft, non-tender and normal bowel sounds Musculoskeletal:no cyanosis of digits and no clubbing  NEURO: alert & oriented x 3 with fluent speech, no focal motor/sensory deficits  LABORATORY DATA:  I have reviewed the data as listed    Component Value Date/Time   NA 140 05/06/2022 1126   NA 144 07/30/2018 1217   NA 144 05/29/2016 1119   K 3.9 05/06/2022 1126   K 4.4 05/29/2016 1119   CL 106 05/06/2022 1126   CO2 21 (L) 05/06/2022 1126   CO2 23 05/29/2016 1119   GLUCOSE 88 05/06/2022 1126   GLUCOSE 91 05/29/2016 1119   BUN 24 (H) 05/06/2022 1126   BUN 21 07/30/2018 1217   BUN 20.8 05/29/2016 1119   CREATININE 2.00 (H) 05/06/2022 1126   CREATININE 1.7 (H) 05/29/2016 1119   CALCIUM 9.7 05/06/2022 1126  CALCIUM 9.5 05/29/2016 1119   PROT 7.3 05/23/2021 1336   PROT 7.1 07/30/2018 1217   PROT 6.8 05/29/2016 1119   ALBUMIN 4.2 05/23/2021 1336   ALBUMIN 4.6 07/30/2018 1217   ALBUMIN 3.7  05/29/2016 1119   AST 24 05/23/2021 1336   AST 28 05/29/2016 1119   ALT 17 05/23/2021 1336   ALT 29 05/29/2016 1119   ALKPHOS 64 05/23/2021 1336   ALKPHOS 68 05/29/2016 1119   BILITOT 0.5 05/23/2021 1336   BILITOT 0.3 07/30/2018 1217   BILITOT 0.24 05/29/2016 1119   GFRNONAA 24 (L) 05/06/2022 1126   GFRAA 32 (L) 11/04/2018 1120    No results found for: "SPEP", "UPEP"  Lab Results  Component Value Date   WBC 7.5 05/06/2022   NEUTROABS 5.3 05/06/2022   HGB 11.7 (L) 05/06/2022   HCT 36.4 05/06/2022   MCV 95.0 05/06/2022   PLT 250 05/06/2022      Chemistry      Component Value Date/Time   NA 140 05/06/2022 1126   NA 144 07/30/2018 1217   NA 144 05/29/2016 1119   K 3.9 05/06/2022 1126   K 4.4 05/29/2016 1119   CL 106 05/06/2022 1126   CO2 21 (L) 05/06/2022 1126   CO2 23 05/29/2016 1119   BUN 24 (H) 05/06/2022 1126   BUN 21 07/30/2018 1217   BUN 20.8 05/29/2016 1119   CREATININE 2.00 (H) 05/06/2022 1126   CREATININE 1.7 (H) 05/29/2016 1119      Component Value Date/Time   CALCIUM 9.7 05/06/2022 1126   CALCIUM 9.5 05/29/2016 1119   ALKPHOS 64 05/23/2021 1336   ALKPHOS 68 05/29/2016 1119   AST 24 05/23/2021 1336   AST 28 05/29/2016 1119   ALT 17 05/23/2021 1336   ALT 29 05/29/2016 1119   BILITOT 0.5 05/23/2021 1336   BILITOT 0.3 07/30/2018 1217   BILITOT 0.24 05/29/2016 1119

## 2022-09-12 ENCOUNTER — Ambulatory Visit: Payer: 59 | Admitting: Internal Medicine

## 2022-09-12 ENCOUNTER — Ambulatory Visit: Payer: 59 | Admitting: Emergency Medicine

## 2022-09-20 ENCOUNTER — Ambulatory Visit (INDEPENDENT_AMBULATORY_CARE_PROVIDER_SITE_OTHER): Payer: 59 | Admitting: Emergency Medicine

## 2022-09-20 ENCOUNTER — Encounter: Payer: Self-pay | Admitting: Emergency Medicine

## 2022-09-20 VITALS — BP 132/77 | HR 87 | Temp 98.2°F | Ht 60.0 in | Wt 126.4 lb

## 2022-09-20 DIAGNOSIS — M545 Low back pain, unspecified: Secondary | ICD-10-CM

## 2022-09-20 DIAGNOSIS — Z23 Encounter for immunization: Secondary | ICD-10-CM | POA: Diagnosis not present

## 2022-09-20 NOTE — Assessment & Plan Note (Addendum)
Most likely musculoskeletal. Mechanical in nature. Clinically stable.  Much better today. No red flag signs or symptoms. History of cancer.  Metastatic disease always a possibility PET scan done on 05/18/2022 negative for skeletal metastasis Advised to take Tylenol as needed for pain Advised to avoid NSAIDs

## 2022-09-20 NOTE — Progress Notes (Signed)
[name] 87 y.o.   Chief Complaint  Patient presents with   Back Pain    Patient is having some issues with her back, patient states both sides of her hips are hurting as well.     HISTORY OF PRESENT ILLNESS: Acute problem visit today. This is a 87 y.o. female complaining of back pain that was worse last week better today. No other associated symptoms.  Denies any injury. No other complaints or medical concerns today.  Back Pain Pertinent negatives include no abdominal pain, chest pain, dysuria, fever or headaches.     Prior to Admission medications   Medication Sig Start Date End Date Taking? Authorizing Provider  acetaminophen (TYLENOL) 500 MG tablet Take 1 tablet (500 mg total) by mouth every 6 (six) hours as needed. 01/15/17  Yes Linus Mako B, NP  atorvastatin (LIPITOR) 10 MG tablet TAKE 1 TABLET (10 MG TOTAL) BY MOUTH DAILY AT 6 PM. 06/19/22  Yes Amisadai Woodford, Eilleen Kempf, MD  chlorthalidone (HYGROTON) 25 MG tablet TAKE 1 TABLET BY MOUTH EVERY DAY 07/14/22  Yes Yousef Huge, Eilleen Kempf, MD  Cholecalciferol (VITAMIN D-3) 1000 units CAPS Take 1 capsule (1,000 Units total) by mouth daily. 08/02/17  Yes Collie Siad A, MD  clopidogrel (PLAVIX) 75 MG tablet TAKE 1 TABLET BY MOUTH EVERY DAY WITH BREAKFAST 04/17/22  Yes Cassian Torelli, Eilleen Kempf, MD  gabapentin (NEURONTIN) 300 MG capsule Take 300 mg by mouth daily.   Yes [provider]  LORazepam (ATIVAN) 0.5 MG tablet Take 1 tablet (0.5 mg total) by mouth 2 (two) times daily as needed for anxiety. 05/08/22  Yes Gorsuch, Ni, MD  magnesium oxide (MAG-OX) 400 MG tablet Take 1 tablet (400 mg total) by mouth daily. 08/02/17  Yes Stallings, Zoe A, MD  methocarbamol (ROBAXIN) 500 MG tablet TAKE 1 TABLET BY MOUTH AT BEDTIME AS NEEDED FOR MUSCLE SPASMS. 04/21/22  Yes Le Ferraz, Eilleen Kempf, MD  metoprolol succinate (TOPROL-XL) 25 MG 24 hr tablet TAKE 1 TABLET (25 MG) BY ORAL ROUTE TWICE A DAY 08/25/21  Yes Jillann Charette, Eilleen Kempf, MD  Multiple  Vitamin (MULTIVITAMIN) tablet Take 1 tablet by mouth at bedtime.    Yes [provider]  telmisartan (MICARDIS) 40 MG tablet TAKE 1 TABLET BY MOUTH EVERY DAY 07/14/22  Yes Lauriana Denes, Eilleen Kempf, MD    Allergies  Allergen Reactions   Aspirin Other (See Comments)    REACTION: stomach upset   Lactose Intolerance (Gi) Diarrhea and Other (See Comments)    "can't drink milk. It tears my stomach up."   Adhesive [Tape] Rash   Lisinopril Other (See Comments)    cough   Percocet [Oxycodone-Acetaminophen] Itching    Patient Active Problem List   Diagnosis Date Noted   Malignant neoplasm metastatic to brain (HCC) 05/08/2022   Memory impairment 04/12/2022   Hearing loss of right ear due to cerumen impaction 03/02/2022   Atherosclerosis of aorta (HCC) 06/14/2020   Pulmonary emphysema (HCC) 06/14/2020   Atherosclerosis of native coronary artery of native heart without angina pectoris 06/14/2020   Sacroiliac joint disease 08/02/2017   History of TIA (transient ischemic attack) 08/02/2017   Vitamin D deficiency 08/02/2017   Arthritis 08/02/2017   Long term current use of anticoagulant therapy 08/02/2017   Left thyroid nodule 01/01/2015   Benign hypertension with CKD (chronic kidney disease) stage IV (HCC) 11/06/2013   Anemia in chronic kidney disease 05/28/2013   History of lung cancer 04/07/2013   Essential hypertension, benign 03/05/2013   Personal history of  colonic polyps 10/29/2012    Past Medical History:  Diagnosis Date   Arthritis    Benign hypertensive kidney disease with chronic kidney disease stage I through stage IV, or unspecified(403.10)    sees dr. Hyman Hopes again in april   Cancer [facility])    lung ca dx'd 02/2013   Chronic kidney disease, stage III (moderate) (HCC)    Cough 02/26/2013   GERD (gastroesophageal reflux disease)    Headache    Hypertension    Hyperthyroidism    took iodine treatment for this   Lung mass 02/27/2013   Neurologic disorder    2009   Shortness  of breath    exertion   Thrombocytopenia, unspecified (HCC) 02/26/2013   Unspecified vitamin D deficiency     Past Surgical History:  Procedure Laterality Date   BACK SURGERY     EYE SURGERY     cataract   EYE SURGERY Left    open up tear duct   LYMPH NODE DISSECTION Left 04/07/2013   Procedure: LYMPH NODE DISSECTION;  Surgeon: Delight Ovens, MD;  Location: [facility] OR;  Service: Thoracic;  Laterality: Left;   NECK SURGERY     PORT-A-CATH REMOVAL Left 01/09/2014   Procedure: REMOVAL PORT-A-CATH;  Surgeon: Delight Ovens, MD;  Location: [facility] OR;  Service: Thoracic;  Laterality: Left;   PORTACATH PLACEMENT Left 05/06/2013   Procedure: INSERTION PORT-A-CATH;  Surgeon: Delight Ovens, MD;  Location: [facility] OR;  Service: Thoracic;  Laterality: Left;   VIDEO ASSISTED THORACOSCOPY (VATS)/ LOBECTOMY Left 04/07/2013   Procedure: VIDEO ASSISTED THORACOSCOPY (VATS)/ LOBECTOMY; INSERTION OF ON-Q PUMP;  Surgeon: Delight Ovens, MD;  Location: [facility] OR;  Service: Thoracic;  Laterality: Left;   VIDEO BRONCHOSCOPY N/A 04/07/2013   Procedure: VIDEO BRONCHOSCOPY;  Surgeon: Delight Ovens, MD;  Location: [facility] OR;  Service: Thoracic;  Laterality: N/A;    Social History   Socioeconomic History   Marital status: Single    Spouse name: Not on file   Number of children: 2   Years of education: 4   Highest education level: Not on file  Occupational History   Not on file  Tobacco Use   Smoking status: Former    Current packs/day: 0.00    Average packs/day: 0.5 packs/day for 60.0 years (30.0 ttl pk-yrs)    Types: Cigarettes    Start date: 02/25/1952    Quit date: 02/25/2012    Years since quitting: 10.5   Smokeless tobacco: Never  Vaping Use   Vaping status: Never Used  Substance and Sexual Activity   Alcohol use: No   Drug use: No   Sexual activity: Not on file  Other Topics Concern   Not on file  Social History Narrative   Right handed   Drinks caffeine   One story home   Lives alone   retired    Chief Executive Officer Determinants of Health   Financial Resource Strain: Low Risk  (11/11/2020)   Overall Financial Resource Strain (CARDIA)    Difficulty of Paying Living Expenses: Not hard at all  Food Insecurity: No Food Insecurity (05/19/2022)   Hunger Vital Sign    Worried About Running Out of Food in the Last Year: Never true    Ran Out of Food in the Last Year: Never true  Transportation Needs: No Transportation Needs (05/08/2022)   PRAPARE - Administrator, Civil Service (Medical): No    Lack of Transportation (Non-Medical): No  Physical Activity: Sufficiently Active (11/11/2020)  Exercise Vital Sign    Days of Exercise per Week: 5 days    Minutes of Exercise per Session: 30 min  Stress: No Stress Concern Present (11/11/2020)   [employer] of Occupational Health - Occupational Stress Questionnaire    Feeling of Stress : Not at all  Social Connections: Moderately Integrated (11/11/2020)   Social Connection and Isolation Panel [NHANES]    Frequency of Communication with Friends and Family: More than three times a week    Frequency of Social Gatherings with Friends and Family: More than three times a week    Attends Religious Services: More than 4 times per year    Active Member of [employer] or Organizations: Yes    Attends Engineer, structural: More than 4 times per year    Marital Status: Never married  Intimate Partner Violence: Not At Risk (05/19/2022)   Humiliation, Afraid, Rape, and Kick questionnaire    Fear of Current or Ex-Partner: No    Emotionally Abused: No    Physically Abused: No    Sexually Abused: No    Family History  Problem Relation Age of Onset   Breast cancer Mother    Lung cancer Father        smoked   Lung cancer Brother        smoked   Rectal cancer Neg Hx    Stomach cancer Neg Hx    Esophageal cancer Neg Hx    Colon cancer Neg Hx      Review of Systems  Constitutional: Negative.  Negative for chills and fever.  HENT:  Negative.  Negative for congestion and sore throat.   Respiratory: Negative.  Negative for cough and shortness of breath.   Cardiovascular: Negative.  Negative for chest pain and palpitations.  Gastrointestinal:  Negative for abdominal pain, diarrhea, nausea and vomiting.  Genitourinary: Negative.  Negative for dysuria and hematuria.  Musculoskeletal:  Positive for back pain.  Skin: Negative.  Negative for rash.  Neurological: Negative.  Negative for dizziness and headaches.    Vitals:   09/20/22 1317  BP: 132/77  Pulse: 87  Temp: 98.2 F (36.8 C)  SpO2: 97%    Physical Exam Vitals reviewed.  Constitutional:      Appearance: Normal appearance.  HENT:     Head: Normocephalic.  Eyes:     Extraocular Movements: Extraocular movements intact.  Cardiovascular:     Rate and Rhythm: Normal rate and regular rhythm.     Pulses: Normal pulses.     Heart sounds: Normal heart sounds.  Pulmonary:     Effort: Pulmonary effort is normal.     Breath sounds: Normal breath sounds.  Abdominal:     Palpations: Abdomen is soft.     Tenderness: There is no abdominal tenderness.  Musculoskeletal:     Comments: Lower back: Old surgical incision visible.  No spine tenderness.  No bony tenderness. Unremarkable exam  Skin:    General: Skin is warm and dry.  Neurological:     Mental Status: She is alert and oriented to person, place, and time.  Psychiatric:        Mood and Affect: Mood normal.        Behavior: Behavior normal.      ASSESSMENT & PLAN: A total of 40 minutes was spent with the patient and counseling/coordination of care regarding preparing for this visit, review of most recent office visit notes, review of chronic medical conditions under management, review of all medications, differential diagnosis  of lumbosacral pain and management, pain management, review of most recent PET scan imaging report, prognosis, ED precautions, documentation and need for follow-up.  Problem List  Items Addressed This Visit       Other   Lumbosacral pain - Primary    Most likely musculoskeletal. Mechanical in nature. Clinically stable.  Much better today. No red flag signs or symptoms. History of cancer.  Metastatic disease always a possibility PET scan done on 05/18/2022 negative for skeletal metastasis Advised to take Tylenol as needed for pain Advised to avoid NSAIDs      Other Visit Diagnoses     Need for vaccination       Relevant Orders   Flu Vaccine Trivalent High Dose (Fluad)      Patient Instructions  Acute Back Pain, Adult Acute back pain is sudden and usually short-lived. It is often caused by an injury to the muscles and tissues in the back. The injury may result from: A muscle, tendon, or ligament getting overstretched or torn. Ligaments are tissues that connect bones to each other. Lifting something improperly can cause a back strain. Wear and tear (degeneration) of the spinal disks. Spinal disks are circular tissue that provide cushioning between the bones of the spine (vertebrae). Twisting motions, such as while playing sports or doing yard work. A hit to the back. Arthritis. You may have a physical exam, lab tests, and imaging tests to find the cause of your pain. Acute back pain usually goes away with rest and home care. Follow these instructions at home: Managing pain, stiffness, and swelling Take over-the-counter and prescription medicines only as told by your health care provider. Treatment may include medicines for pain and inflammation that are taken by mouth or applied to the skin, or muscle relaxants. Your health care provider may recommend applying ice during the first 24-48 hours after your pain starts. To do this: Put ice in a plastic bag. Place a towel between your skin and the bag. Leave the ice on for 20 minutes, 2-3 times a day. Remove the ice if your skin turns bright red. This is very important. If you cannot feel pain, heat, or cold,  you have a greater risk of damage to the area. If directed, apply heat to the affected area as often as told by your health care provider. Use the heat source that your health care provider recommends, such as a moist heat pack or a heating pad. Place a towel between your skin and the heat source. Leave the heat on for 20-30 minutes. Remove the heat if your skin turns bright red. This is especially important if you are unable to feel pain, heat, or cold. You have a greater risk of getting burned. Activity  Do not stay in bed. Staying in bed for more than 1-2 days can delay your recovery. Sit up and stand up straight. Avoid leaning forward when you sit or hunching over when you stand. If you work at a desk, sit close to it so you do not need to lean over. Keep your chin tucked in. Keep your neck drawn back, and keep your elbows bent at a 90-degree angle (right angle). Sit high and close to the steering wheel when you drive. Add lower back (lumbar) support to your car seat, if needed. Take short walks on even surfaces as soon as you are able. Try to increase the length of time you walk each day. Do not sit, drive, or stand in one place for more  than 30 minutes at a time. Sitting or standing for long periods of time can put stress on your back. Do not drive or use heavy machinery while taking prescription pain medicine. Use proper lifting techniques. When you bend and lift, use positions that put less stress on your back: [employer] your knees. Keep the load close to your body. Avoid twisting. Exercise regularly as told by your health care provider. Exercising helps your back heal faster and helps prevent back injuries by keeping muscles strong and flexible. Work with a physical therapist to make a safe exercise program, as recommended by your health care provider. Do any exercises as told by your physical therapist. Lifestyle Maintain a healthy weight. Extra weight puts stress on your back and makes it  difficult to have good posture. Avoid activities or situations that make you feel anxious or stressed. Stress and anxiety increase muscle tension and can make back pain worse. Learn ways to manage anxiety and stress, such as through exercise. General instructions Sleep on a firm mattress in a comfortable position. Try lying on your side with your knees slightly bent. If you lie on your back, put a pillow under your knees. Keep your head and neck in a straight line with your spine (neutral position) when using electronic equipment like smartphones or pads. To do this: Raise your smartphone or pad to look at it instead of bending your head or neck to look down. Put the smartphone or pad at the level of your face while looking at the screen. Follow your treatment plan as told by your health care provider. This may include: Cognitive or behavioral therapy. Acupuncture or massage therapy. Meditation or yoga. Contact a health care provider if: You have pain that is not relieved with rest or medicine. You have increasing pain going down into your legs or buttocks. Your pain does not improve after 2 weeks. You have pain at night. You lose weight without trying. You have a fever or chills. You develop nausea or vomiting. You develop abdominal pain. Get help right away if: You develop new bowel or bladder control problems. You have unusual weakness or numbness in your arms or legs. You feel faint. These symptoms may represent a serious problem that is an emergency. Do not wait to see if the symptoms will go away. Get medical help right away. Call your local emergency services (911 in the [place] Do not drive yourself to the hospital. Summary Acute back pain is sudden and usually short-lived. Use proper lifting techniques. When you bend and lift, use positions that put less stress on your back. Take over-the-counter and prescription medicines only as told by your health care provider, and apply heat  or ice as told. This information is not intended to replace advice given to you by your health care provider. Make sure you discuss any questions you have with your health care provider. Document Revised: 04/02/2020 Document Reviewed: 04/02/2020 Elsevier Patient Education  2024 Elsevier Inc.    Edwina Barth, MD [facility]

## 2022-09-20 NOTE — Patient Instructions (Signed)
Acute Back Pain, Adult Acute back pain is sudden and usually short-lived. It is often caused by an injury to the muscles and tissues in the back. The injury may result from: A muscle, tendon, or ligament getting overstretched or torn. Ligaments are tissues that connect bones to each other. Lifting something improperly can cause a back strain. Wear and tear (degeneration) of the spinal disks. Spinal disks are circular tissue that provide cushioning between the bones of the spine (vertebrae). Twisting motions, such as while playing sports or doing yard work. A hit to the back. Arthritis. You may have a physical exam, lab tests, and imaging tests to find the cause of your pain. Acute back pain usually goes away with rest and home care. Follow these instructions at home: Managing pain, stiffness, and swelling Take over-the-counter and prescription medicines only as told by your health care provider. Treatment may include medicines for pain and inflammation that are taken by mouth or applied to the skin, or muscle relaxants. Your health care provider may recommend applying ice during the first 24-48 hours after your pain starts. To do this: Put ice in a plastic bag. Place a towel between your skin and the bag. Leave the ice on for 20 minutes, 2-3 times a day. Remove the ice if your skin turns bright red. This is very important. If you cannot feel pain, heat, or cold, you have a greater risk of damage to the area. If directed, apply heat to the affected area as often as told by your health care provider. Use the heat source that your health care provider recommends, such as a moist heat pack or a heating pad. Place a towel between your skin and the heat source. Leave the heat on for 20-30 minutes. Remove the heat if your skin turns bright red. This is especially important if you are unable to feel pain, heat, or cold. You have a greater risk of getting burned. Activity  Do not stay in bed. Staying in  bed for more than 1-2 days can delay your recovery. Sit up and stand up straight. Avoid leaning forward when you sit or hunching over when you stand. If you work at a desk, sit close to it so you do not need to lean over. Keep your chin tucked in. Keep your neck drawn back, and keep your elbows bent at a 90-degree angle (right angle). Sit high and close to the steering wheel when you drive. Add lower back (lumbar) support to your car seat, if needed. Take short walks on even surfaces as soon as you are able. Try to increase the length of time you walk each day. Do not sit, drive, or stand in one place for more than 30 minutes at a time. Sitting or standing for long periods of time can put stress on your back. Do not drive or use heavy machinery while taking prescription pain medicine. Use proper lifting techniques. When you bend and lift, use positions that put less stress on your back: Bend your knees. Keep the load close to your body. Avoid twisting. Exercise regularly as told by your health care provider. Exercising helps your back heal faster and helps prevent back injuries by keeping muscles strong and flexible. Work with a physical therapist to make a safe exercise program, as recommended by your health care provider. Do any exercises as told by your physical therapist. Lifestyle Maintain a healthy weight. Extra weight puts stress on your back and makes it difficult to have good   posture. Avoid activities or situations that make you feel anxious or stressed. Stress and anxiety increase muscle tension and can make back pain worse. Learn ways to manage anxiety and stress, such as through exercise. General instructions Sleep on a firm mattress in a comfortable position. Try lying on your side with your knees slightly bent. If you lie on your back, put a pillow under your knees. Keep your head and neck in a straight line with your spine (neutral position) when using electronic equipment like  smartphones or pads. To do this: Raise your smartphone or pad to look at it instead of bending your head or neck to look down. Put the smartphone or pad at the level of your face while looking at the screen. Follow your treatment plan as told by your health care provider. This may include: Cognitive or behavioral therapy. Acupuncture or massage therapy. Meditation or yoga. Contact a health care provider if: You have pain that is not relieved with rest or medicine. You have increasing pain going down into your legs or buttocks. Your pain does not improve after 2 weeks. You have pain at night. You lose weight without trying. You have a fever or chills. You develop nausea or vomiting. You develop abdominal pain. Get help right away if: You develop new bowel or bladder control problems. You have unusual weakness or numbness in your arms or legs. You feel faint. These symptoms may represent a serious problem that is an emergency. Do not wait to see if the symptoms will go away. Get medical help right away. Call your local emergency services (911 in the [place] Do not drive yourself to the hospital. Summary Acute back pain is sudden and usually short-lived. Use proper lifting techniques. When you bend and lift, use positions that put less stress on your back. Take over-the-counter and prescription medicines only as told by your health care provider, and apply heat or ice as told. This information is not intended to replace advice given to you by your health care provider. Make sure you discuss any questions you have with your health care provider. Document Revised: 04/02/2020 Document Reviewed: 04/02/2020 Elsevier Patient Education  2024 Elsevier Inc.  

## 2022-09-24 LAB — LAB REPORT - SCANNED
Creatinine, POC: 58.9 mg/dL
EGFR: 24

## 2022-09-26 ENCOUNTER — Ambulatory Visit (HOSPITAL_COMMUNITY)
Admission: RE | Admit: 2022-09-26 | Discharge: 2022-09-26 | Disposition: A | Discharge status: Home / Self Care | Payer: 59 | Source: Ambulatory Visit | Attending: Hematology and Oncology | Admitting: Hematology and Oncology

## 2022-09-26 DIAGNOSIS — Z85118 Personal history of other malignant neoplasm of bronchus and lung: Secondary | ICD-10-CM | POA: Diagnosis present

## 2022-09-27 ENCOUNTER — Encounter: Payer: Self-pay | Admitting: Nephrology

## 2022-09-28 ENCOUNTER — Inpatient Hospital Stay: Payer: 59 | Attending: Hematology and Oncology | Admitting: Hematology and Oncology

## 2022-09-28 ENCOUNTER — Ambulatory Visit: Payer: 59 | Admitting: Physician Assistant

## 2022-10-02 ENCOUNTER — Telehealth: Payer: Self-pay | Admitting: Hematology and Oncology

## 2022-10-02 NOTE — Telephone Encounter (Signed)
 Patient's daughter is aware of scheduled appointment times/dates

## 2022-10-13 ENCOUNTER — Other Ambulatory Visit: Payer: 59

## 2022-10-17 ENCOUNTER — Telehealth: Payer: Self-pay

## 2022-10-17 ENCOUNTER — Inpatient Hospital Stay: Payer: 59 | Admitting: Hematology and Oncology

## 2022-10-17 NOTE — Telephone Encounter (Signed)
Pt's daughter called back with next appt date/time. Pt's daughter verbalized understanding.

## 2022-10-17 NOTE — Telephone Encounter (Signed)
RN contacted pt's daughter to see if she was going to be able to make it to her 1:40 pm appointment. Pt's daughter was apologetic and states that she accidentally forgot about the appt. This RN states that we will get her mother's appt rescheduled. Pt's daughter stated understanding.

## 2022-10-19 ENCOUNTER — Inpatient Hospital Stay: Payer: 59 | Admitting: Internal Medicine

## 2022-10-24 ENCOUNTER — Ambulatory Visit
Admission: RE | Admit: 2022-10-24 | Discharge: 2022-10-24 | Disposition: A | Discharge status: Home / Self Care | Payer: 59 | Source: Ambulatory Visit | Attending: Radiation Oncology | Admitting: Radiation Oncology

## 2022-10-24 DIAGNOSIS — C7931 Secondary malignant neoplasm of brain: Secondary | ICD-10-CM

## 2022-10-24 MED ORDER — GADOPICLENOL 0.5 MMOL/ML IV SOLN
7.5000 mL | Freq: Once | INTRAVENOUS | Status: AC | PRN
  Administered 2022-10-24: 7.5 mL via INTRAVENOUS

## 2022-10-27 ENCOUNTER — Telehealth: Payer: Self-pay

## 2022-10-27 ENCOUNTER — Encounter: Payer: Self-pay | Admitting: Hematology and Oncology

## 2022-10-27 ENCOUNTER — Inpatient Hospital Stay: Payer: 59 | Attending: Hematology and Oncology | Admitting: Hematology and Oncology

## 2022-10-27 ENCOUNTER — Other Ambulatory Visit: Payer: Self-pay | Admitting: Hematology and Oncology

## 2022-10-27 VITALS — BP 158/74 | HR 73 | Resp 18 | Ht 60.0 in | Wt 132.8 lb

## 2022-10-27 DIAGNOSIS — Z85118 Personal history of other malignant neoplasm of bronchus and lung: Secondary | ICD-10-CM | POA: Diagnosis present

## 2022-10-27 DIAGNOSIS — C7931 Secondary malignant neoplasm of brain: Secondary | ICD-10-CM | POA: Insufficient documentation

## 2022-10-27 DIAGNOSIS — I129 Hypertensive chronic kidney disease with stage 1 through stage 4 chronic kidney disease, or unspecified chronic kidney disease: Secondary | ICD-10-CM | POA: Diagnosis not present

## 2022-10-27 DIAGNOSIS — N184 Chronic kidney disease, stage 4 (severe): Secondary | ICD-10-CM

## 2022-10-27 DIAGNOSIS — Z801 Family history of malignant neoplasm of trachea, bronchus and lung: Secondary | ICD-10-CM | POA: Insufficient documentation

## 2022-10-27 DIAGNOSIS — Z803 Family history of malignant neoplasm of breast: Secondary | ICD-10-CM | POA: Insufficient documentation

## 2022-10-27 NOTE — Telephone Encounter (Signed)
Pt LVM stating that she will be a few minutes late to her appt.

## 2022-10-27 NOTE — Progress Notes (Signed)
[facility] OFFICE PROGRESS NOTE  Patient Care Team: Georgina Quint, MD as PCP - General (Internal Medicine) Artis Delay, MD as Consulting Physician (Hematology and Oncology) Delight Ovens, MD (Inactive) as Consulting Physician (Cardiothoracic Surgery) Gelene Mink, OD as Referring Physician (Optometry) [name] (Neurology)  HISTORY OF PRESENTING ILLNESS: Discussed the use of AI scribe software for clinical note transcription with the patient, who gave verbal consent to proceed.  History of Present Illness   The patient, with a history of a left lung cancer surgically resected in 2015, presents for a follow-up consultation. She reports a new sensation in her cheek, describing it as a movable mass. The patient noticed this after a recent procedure involving a facial mask, which also caused temporary facial swelling. She denies any associated pain but describes a persistent 'funny feeling.'  The patient also reports a significant reduction in the size of a previously identified abnormal brain lesion, as confirmed by recent MRI scans. She has been managing her health well, with no new symptoms or complications reported.  The patient's blood work is generally satisfactory, although there are some concerns about kidney function. She reports a good hydration habit, primarily consuming water, with one cup of coffee in the morning.  The patient has been compliant with her medical regimen and preventive measures, including receiving her flu shot.         Assessment and Plan    Lung Cancer Status post resection in 2015 with no signs of recurrence on recent CT scan. Noted significant improvement in previously identified mass on MRI. -Continue current management. -Order CT chest in March 2025.  Salivary Gland lesion Patient reports feeling a movable mass in cheek. On examination, no abnormality detected. Likely swollen salivary gland. -Reassured patient  and advised to monitor.  Renal Function Blood work indicates possible renal insufficiency. Patient reports adequate water intake. -Advise patient to continue drinking 64 ounces of water daily. -Repeat blood work in 5 months (March 2025).  General Health Maintenance -Flu vaccination confirmed. -Follow-up appointment in 5 months (March 2025).          Orders Placed This Encounter  Procedures   CT CHEST WO CONTRAST    Standing Status:   Future    Standing Expiration Date:   10/27/2023    Order Specific Question:   Preferred imaging location?    Answer:   [facility]    Order Specific Question:   Radiology Contrast Protocol - do NOT remove file path    Answer:   [email or web address]   CBC with Differential (Cancer Center Only)    Standing Status:   Future    Standing Expiration Date:   10/27/2023   CMP (Cancer Center only)    Standing Status:   Future    Standing Expiration Date:   10/27/2023    All questions were answered. The patient knows to call the clinic with any problems, questions or concerns. The total time spent in the appointment was 30 minutes encounter with patients including review of chart and various tests results, discussions about plan of care and coordination of care plan   Artis Delay, MD 10/27/2022 11:29 AM  REVIEW OF SYSTEMS:   Constitutional: Denies fevers, chills or abnormal weight loss Eyes: Denies blurriness of vision Ears, nose, mouth, throat, and face: Denies mucositis or sore throat Respiratory: Denies cough, dyspnea or wheezes Cardiovascular: Denies palpitation, chest discomfort or lower extremity swelling Gastrointestinal:  Denies nausea, heartburn or change in bowel  habits Skin: Denies abnormal skin rashes Lymphatics: Denies new lymphadenopathy or easy bruising Neurological:Denies numbness, tingling or new weaknesses Behavioral/Psych: Mood is stable, no new changes  All other systems were reviewed  with the patient and are negative.  I have reviewed the past medical history, past surgical history, social history and family history with the patient and they are unchanged from previous note.  ALLERGIES:  is allergic to aspirin, lactose intolerance (gi), adhesive [tape], lisinopril, and percocet [oxycodone-acetaminophen].  MEDICATIONS:  Current Outpatient Medications  Medication Sig Dispense Refill   acetaminophen (TYLENOL) 500 MG tablet Take 1 tablet (500 mg total) by mouth every 6 (six) hours as needed. 30 tablet 0   atorvastatin (LIPITOR) 10 MG tablet TAKE 1 TABLET (10 MG TOTAL) BY MOUTH DAILY AT 6 PM. 90 tablet 3   chlorthalidone (HYGROTON) 25 MG tablet TAKE 1 TABLET BY MOUTH EVERY DAY 90 tablet 3   Cholecalciferol (VITAMIN D-3) 1000 units CAPS Take 1 capsule (1,000 Units total) by mouth daily. 90 capsule 1   clopidogrel (PLAVIX) 75 MG tablet TAKE 1 TABLET BY MOUTH EVERY DAY WITH BREAKFAST 90 tablet 2   gabapentin (NEURONTIN) 300 MG capsule Take 300 mg by mouth daily.     LORazepam (ATIVAN) 0.5 MG tablet Take 1 tablet (0.5 mg total) by mouth 2 (two) times daily as needed for anxiety. 4 tablet 0   magnesium oxide (MAG-OX) 400 MG tablet Take 1 tablet (400 mg total) by mouth daily. 90 tablet 1   methocarbamol (ROBAXIN) 500 MG tablet TAKE 1 TABLET BY MOUTH AT BEDTIME AS NEEDED FOR MUSCLE SPASMS. 30 tablet 2   metoprolol succinate (TOPROL-XL) 25 MG 24 hr tablet TAKE 1 TABLET (25 MG) BY ORAL ROUTE TWICE A DAY 180 tablet 3   Multiple Vitamin (MULTIVITAMIN) tablet Take 1 tablet by mouth at bedtime.      telmisartan (MICARDIS) 40 MG tablet TAKE 1 TABLET BY MOUTH EVERY DAY 90 tablet 3   No current facility-administered medications for this visit.    SUMMARY OF ONCOLOGIC HISTORY: Oncology History Overview Note  Non-small cell lung cancer, adenocarcinoma   Primary site: Lung (Left)   Staging method: AJCC 7th Edition   Clinical: Stage IB (T2a, N0, M0) signed by Artis Delay, MD on 04/29/2013   9:01 PM   Pathologic: Stage IB (T2a, N0, cM0) signed by Delight Ovens, MD on 04/09/2013 12:51 PM   Summary: Stage IB (T2a, N0, cM0) Molecular studies done on sample from 2105 showed no actionable mutations. MSI stable, low tumor mutation burden    History of lung cancer  02/26/2013 Imaging   CXR for evaluation of cough showed new left lung mass   02/28/2013 Imaging   CT chest showed left new lung nodule   03/14/2013 Imaging   PET/CT scan showed localized disease in the left lung   04/07/2013 Surgery   She underwent bronchoscopy and left lower lobe resection and lymph node sampling with negative margins   05/06/2013 Procedure   The patient has placement of Infuse-a-Port   05/21/2013 - 07/30/2013 Chemotherapy   She completed 4 cycles of adjuvant chemotherapy with navelbine and cisplatin   07/01/2013 Imaging   CT scan of the chest shows stable appearance.   07/02/2013 Adverse Reaction   Dose of chemotherapy was adjusted further due to severe anemia.   12/31/2013 Imaging   CT scan of the chest show no evidence of disease recurrence   07/03/2014 Imaging   Chest x-ray looks normal   12/31/2014 Imaging  Ct chest showed no evidence of recurrence   05/31/2015 Imaging   Status post resection of a left lower lobe lung lesion without findings for recurrent tumor or metastatic disease.    05/29/2016 Imaging   1. Stable exam. Status post left lower lobectomy without evidence for new or progressive findings. 2. Emphysema. 3. Thoracic aortic atherosclerosis   05/30/2017 Imaging   1. Slight interval increase in size of right upper lobe nodule. Recommend attention on follow-up. 2. Stable appearance of the left hemithorax with left lower lobectomy. 3. Aortic Atherosclerosis (ICD10-I70.0) and Emphysema (ICD10-J43.9).   11/13/2018 Imaging   1. No evidence of recurrent or metastatic disease. 2. Clustered peribronchovascular nodularity in the right upper lobe appears stable. 3. 7 mm left upper lobe  ground-glass nodule, stable. Continued attention on follow-up exams is warranted. 4. Aortic atherosclerosis (ICD10-170.0). Coronary artery calcification. 5.  Emphysema (ICD10-J43.9).   05/19/2022 PET scan   NM PET Image Restage (PS) Skull Base to Thigh (F-18 FDG)  Result Date: 05/19/2022 CLINICAL DATA:  Subsequent treatment strategy for metastatic lung cancer. EXAM: NUCLEAR MEDICINE PET SKULL BASE TO THIGH TECHNIQUE: 6.84 mCi F-18 FDG was injected intravenously. Full-ring PET imaging was performed from the skull base to thigh after the radiotracer. CT data was obtained and used for attenuation correction and anatomic localization. Fasting blood glucose: 98 mg/dl COMPARISON:  CT 16/10/9602 FINDINGS: Mediastinal blood pool activity: SUV max 3.67 Liver activity: SUV max NA NECK: Mass within the cerebellum noted on MRI from 05/17/2022 is tracer avid within SUV max of 26.70. Incidental CT findings: No tracer avid cervical lymph nodes. Nodule in the left lobe of thyroid gland containing calcifications measures 2.1 cm. No significant increased radiotracer uptake identified within this nodule. This has been evaluated on previous imaging. (ref: J Am Coll Radiol. 2015 Feb;12(2): 143-50). CHEST: No tracer avid mediastinal, hilar or axillary nodes. There are no tracer avid pulmonary nodules identified. Subsolid nodule in the posterior left lower lobe measures 7 mm without significant tracer uptake, image 38/7. This is technically too small to reliably characterize however low by PET-CT. The irregular nodule within the periphery of the right upper lobe measuring 1 cm is stable from the previous exam without significant tracer uptake, image 20/7. Focal nodular thickening over the posterior lateral left lower lobe measures approximately 1.1 by 0.7 cm without significant tracer uptake, image 79/4. Incidental CT findings: Emphysema.Aortic atherosclerosis coronary artery calcifications. ABDOMEN/PELVIS: No abnormal tracer uptake  identified within the liver, pancreas, spleen, or adrenal glands. Incidental CT findings: Aortic atherosclerosis. Small hiatal hernia. 3.7 cm right kidney cyst. No follow-up imaging recommended. SKELETON: No focal hypermetabolic activity to suggest skeletal metastasis. Incidental CT findings: None. IMPRESSION: 1. The mass within the cerebellum noted on MRI from 05/17/2022 is tracer avid with SUV max of 26.70. 2. No signs of tracer avid nodal metastasis or solid organ metastasis within the chest, abdomen or pelvis. 3. Stable small pulmonary nodules are identified without significant tracer uptake. 4. Coronary artery calcifications. 5.  Aortic Atherosclerosis (ICD10-I70.0). Electronically Signed   By: Signa Kell M.D.   On: 05/19/2022 09:28   MR Brain W Wo Contrast  Result Date: 05/18/2022 CLINICAL DATA:  Brain/CNS neoplasm, staging 3T SRS Protocol for radiation treatment planning. EXAM: MRI HEAD WITHOUT AND WITH CONTRAST TECHNIQUE: Multiplanar, multiecho pulse sequences of the brain and surrounding structures were obtained without and with intravenous contrast. CONTRAST:  6.5 mL Vueway. COMPARISON:  MRI brain 05/06/2022. FINDINGS: Brain: Unchanged 27 x 22 mm enhancing mass with peripheral  hemosiderin staining and surrounding vasogenic edema centered within the medial aspect of the left cerebellar hemisphere (image 44 series 13). No new enhancing lesions. Unchanged mass effect on the inferior fourth ventricle. Stable size and configuration of the third and lateral ventricles. No acute infarct or acute hemorrhage. Unchanged encephalomalacia in the left anterior temporal lobe, likely secondary to prior trauma. Vascular: Normal flow voids and enhancement. Skull and upper cervical spine: Normal marrow signal and enhancement. Sinuses/Orbits: Unremarkable. Other: None. IMPRESSION: 1. Unchanged 27 mm enhancing mass in the left cerebellar hemisphere with surrounding vasogenic edema. No new enhancing lesions. 2. Stable  size and configuration of the third and lateral ventricles. Electronically Signed   By: Orvan Falconer M.D.   On: 05/18/2022 11:18   MR BRAIN W CONTRAST  Result Date: 05/06/2022 CLINICAL DATA:  Brain metastases, unknown primary EXAM: MRI HEAD WITH CONTRAST TECHNIQUE: Multiplanar, multiecho pulse sequences of the brain and surrounding structures were obtained with intravenous contrast. CONTRAST:  6.77mL GADAVIST GADOBUTROL 1 MMOL/ML IV SOLN COMPARISON:  Same day MRI head. FINDINGS: Postcontrast only imaging was performed to further evaluate the abnormality seen on same day noncontrast MRI. The mass within the left cerebellum is enhancing, compatible with a metastasis given the clinical history. No other enhancing lesions identified. Please see same day MRI head for further characterization, including effacement of the fourth ventricle in ventriculomegaly. IMPRESSION: Postcontrast only imaging. The mass within the left cerebellum is enhancing, compatible with a metastasis given the clinical history. No other enhancing lesions identified. Electronically Signed   By: Feliberto Harts M.D.   On: 05/06/2022 15:17   CT Chest Wo Contrast  Result Date: 05/06/2022 CLINICAL DATA:  History of lung cancer. New hemorrhagic mass in the cerebellum. Restage lung cancer. * Tracking Code: BO * EXAM: CT CHEST, ABDOMEN AND PELVIS WITHOUT CONTRAST TECHNIQUE: Multidetector CT imaging of the chest, abdomen and pelvis was performed following the standard protocol without IV contrast. RADIATION DOSE REDUCTION: This exam was performed according to the departmental dose-optimization program which includes automated exposure control, adjustment of the mA and/or kV according to patient size and/or use of iterative reconstruction technique. COMPARISON:  CT chest from 11/28/2021 FINDINGS: CT CHEST FINDINGS Cardiovascular: Heart size is normal. Aortic atherosclerosis. Coronary artery calcifications. No pericardial effusion.  Mediastinum/Nodes: Asymmetric heterogeneous enlargement of the left lobe of thyroid gland. This has been evaluated on previous imaging. (ref: J Am Coll Radiol. 2015 Feb;12(2): 143-50). Trachea appears patent and midline. Normal appearance of the esophagus. No mediastinal or axillary adenopathy. Hilar structures are suboptimally evaluated due to lack of IV contrast. Lungs/Pleura: Central airways appear patent. No pleural effusion or airspace consolidation. Mild to moderate emphysema. Status post left lower lobectomy. Scattered lung nodules are identified, including: -within the periphery of the right lower lobe there is an irregular nodule measuring 1 cm, image 63/4. Previously this measured 0.9 cm. -Sub solid nodule in the posterior left lower lung measures 0.8 cm, image 93/4. Formally 0.6 cm. -Subpleural nodular density over the posterolateral left lower lung measures 1.2 x 0.5 cm, image 95/4. Previously this measured the same. Musculoskeletal: Status post ACDF within the cervical spine. No acute or suspicious bone lesions. No chest wall mass. CT ABDOMEN PELVIS FINDINGS Hepatobiliary: There are 2 small low-attenuation foci within the left lobe of liver and posteromedial right lobe of liver measuring up to 8 mm. These are technically too small to reliably characterize but appear unchanged. Gallbladder is unremarkable. Pancreas: Unremarkable. No pancreatic ductal dilatation or surrounding inflammatory changes. Spleen:  Normal in size without focal abnormality. Adrenals/Urinary Tract: Normal appearance of the adrenal glands. No nephrolithiasis or hydronephrosis. Bilateral simple appearing kidney cysts are noted. The largest is in the lateral cortex of the right kidney measuring 3.6 cm, image 65/3. No follow-up imaging recommended. No hydronephrosis identified bilaterally. Urinary bladder appears normal. Stomach/Bowel: Small hiatal hernia. Stomach is nondistended. The appendix is visualized and appears normal. No  pathologic dilatation of the large or small bowel loops. Distal colonic diverticula noted without signs of acute diverticulitis. Vascular/Lymphatic: Aortic atherosclerosis. No aneurysm. No signs of abdominopelvic adenopathy. Reproductive: Partially calcified fibroid is noted anteriorly measuring 1.9 cm. No adnexal mass identified. Other: No ascites or focal fluid collections. No signs of peritoneal nodularity or mass. Musculoskeletal: Previous PLIF at L4-5. Degenerative disc disease is noted at the remaining lumbar spine levels. Bilateral facet arthropathy. No acute or suspicious osseous findings. IMPRESSION: 1. Several bilateral lung nodules are again noted. There has been no significant change in the size of these nodules (within 1-2 mm). No new pulmonary lesions identified when compared with previous exam. 2. No evidence for metastatic disease to the abdomen or pelvis. 3. Coronary artery calcifications. 4. Aortic Atherosclerosis (ICD10-I70.0) and Emphysema (ICD10-J43.9). Electronically Signed   By: Signa Kell M.D.   On: 05/06/2022 13:56   CT ABDOMEN PELVIS WO CONTRAST  Result Date: 05/06/2022 CLINICAL DATA:  History of lung cancer. New hemorrhagic mass in the cerebellum. Restage lung cancer. * Tracking Code: BO * EXAM: CT CHEST, ABDOMEN AND PELVIS WITHOUT CONTRAST TECHNIQUE: Multidetector CT imaging of the chest, abdomen and pelvis was performed following the standard protocol without IV contrast. RADIATION DOSE REDUCTION: This exam was performed according to the departmental dose-optimization program which includes automated exposure control, adjustment of the mA and/or kV according to patient size and/or use of iterative reconstruction technique. COMPARISON:  CT chest from 11/28/2021 FINDINGS: CT CHEST FINDINGS Cardiovascular: Heart size is normal. Aortic atherosclerosis. Coronary artery calcifications. No pericardial effusion. Mediastinum/Nodes: Asymmetric heterogeneous enlargement of the left lobe of  thyroid gland. This has been evaluated on previous imaging. (ref: J Am Coll Radiol. 2015 Feb;12(2): 143-50). Trachea appears patent and midline. Normal appearance of the esophagus. No mediastinal or axillary adenopathy. Hilar structures are suboptimally evaluated due to lack of IV contrast. Lungs/Pleura: Central airways appear patent. No pleural effusion or airspace consolidation. Mild to moderate emphysema. Status post left lower lobectomy. Scattered lung nodules are identified, including: -within the periphery of the right lower lobe there is an irregular nodule measuring 1 cm, image 63/4. Previously this measured 0.9 cm. -Sub solid nodule in the posterior left lower lung measures 0.8 cm, image 93/4. Formally 0.6 cm. -Subpleural nodular density over the posterolateral left lower lung measures 1.2 x 0.5 cm, image 95/4. Previously this measured the same. Musculoskeletal: Status post ACDF within the cervical spine. No acute or suspicious bone lesions. No chest wall mass. CT ABDOMEN PELVIS FINDINGS Hepatobiliary: There are 2 small low-attenuation foci within the left lobe of liver and posteromedial right lobe of liver measuring up to 8 mm. These are technically too small to reliably characterize but appear unchanged. Gallbladder is unremarkable. Pancreas: Unremarkable. No pancreatic ductal dilatation or surrounding inflammatory changes. Spleen: Normal in size without focal abnormality. Adrenals/Urinary Tract: Normal appearance of the adrenal glands. No nephrolithiasis or hydronephrosis. Bilateral simple appearing kidney cysts are noted. The largest is in the lateral cortex of the right kidney measuring 3.6 cm, image 65/3. No follow-up imaging recommended. No hydronephrosis identified bilaterally. Urinary bladder appears normal.  Stomach/Bowel: Small hiatal hernia. Stomach is nondistended. The appendix is visualized and appears normal. No pathologic dilatation of the large or small bowel loops. Distal colonic  diverticula noted without signs of acute diverticulitis. Vascular/Lymphatic: Aortic atherosclerosis. No aneurysm. No signs of abdominopelvic adenopathy. Reproductive: Partially calcified fibroid is noted anteriorly measuring 1.9 cm. No adnexal mass identified. Other: No ascites or focal fluid collections. No signs of peritoneal nodularity or mass. Musculoskeletal: Previous PLIF at L4-5. Degenerative disc disease is noted at the remaining lumbar spine levels. Bilateral facet arthropathy. No acute or suspicious osseous findings. IMPRESSION: 1. Several bilateral lung nodules are again noted. There has been no significant change in the size of these nodules (within 1-2 mm). No new pulmonary lesions identified when compared with previous exam. 2. No evidence for metastatic disease to the abdomen or pelvis. 3. Coronary artery calcifications. 4. Aortic Atherosclerosis (ICD10-I70.0) and Emphysema (ICD10-J43.9). Electronically Signed   By: Signa Kell M.D.   On: 05/06/2022 13:56   MR BRAIN WO CONTRAST  Result Date: 05/06/2022 CLINICAL DATA:  Memory impairment.  Recent headaches and imbalance EXAM: MRI HEAD WITHOUT CONTRAST TECHNIQUE: Multiplanar, multiecho pulse sequences of the brain and surrounding structures were obtained without intravenous contrast. COMPARISON:  10/16/2005 FINDINGS: Brain: Isointense mass with hemorrhagic features (hemosiderin at its inferior periphery) measuring up to 2.7 cm. Moderate vasogenic edema in the mass causes effacement of the lower fourth ventricle with third and lateral ventriculomegaly. Mild periventricular FLAIR hyperintensity at the lateral ventricles which is more likely chronic small vessel ischemia than transependymal flow given incomplete appearance in the degree of ventriculomegaly. Gliosis and volume loss at the anterior left temporal lobe which was also seen on prior, possibly posttraumatic in this location. Case discussed with the patient's daughter who is driving the  patient and ER referral has been made. [facility] made aware. Vascular: Major flow voids are preserved Skull and upper cervical spine: Normal marrow signal. Sinuses/Orbits: No significant finding. Other: Small proteinaceous cystic intensity anterior to the hyoid, consistent with thyroglossal duct cyst measuring 1 cm. IMPRESSION: Nearly 3 cm hemorrhagic mass with vasogenic edema in the lower cerebellum. There is effacement of the fourth ventricle with ventriculomegaly. Presumed metastasis in this patient with history malignancy, recommend postcontrast assessment. Electronically Signed   By: Tiburcio Pea M.D.   On: 05/06/2022 09:48      05/26/2022 - 06/05/2022 Radiation Therapy   First Treatment Date: 2022-05-26 - Last Treatment Date: 2022-06-05   Plan Name: Brain_SRT Site: Brain Technique: SBRT/SRT-IMRT Mode: Photon Dose Per Fraction: 9 Gy Prescribed Dose (Delivered / Prescribed): 27 Gy / 27 Gy Prescribed Fxs (Delivered / Prescribed): 3 / 3   09/28/2022 Imaging   CT CHEST WO CONTRAST  Result Date: 09/27/2022 CLINICAL DATA:  Non-small cell lung cancer; * Tracking Code: BO * EXAM: CT CHEST WITHOUT CONTRAST TECHNIQUE: Multidetector CT imaging of the chest was performed following the standard protocol without IV contrast. RADIATION DOSE REDUCTION: This exam was performed according to the departmental dose-optimization program which includes automated exposure control, adjustment of the mA and/or kV according to patient size and/or use of iterative reconstruction technique. COMPARISON:  Multiple priors, most recent PET-CT dated May 18, 2022 and chest CT dated April 13th 2024 FINDINGS: Cardiovascular: Normal heart size. No pericardial effusion. Normal caliber thoracic aorta with severe calcified plaque. Severe coronary artery calcifications. Mediastinum/Nodes: Esophagus is unremarkable. Multinodular left thyroid goiter unchanged when compared with the prior exam. No enlarged lymph nodes seen in the  chest. Lungs/Pleura: Central airways  are patent. Moderate centrilobular emphysema. Stable postsurgical findings of prior left lower lobectomy. Stable subpleural linear opacities of the lower left lung, likely due to scarring. Scattered bilateral pulmonary nodules are stable. Reference irregular solid nodule of the right upper lobe measuring 10 mm with possible small ground-glass component located on series 6, image 49, unchanged. Stable irregular solid nodule of the left lung located on image 80. Stable ground-glass nodule of the right upper lobe measuring 9 mm on series 6, image 39. No pleural effusion. Upper Abdomen: Partially visualized simple appearing cyst of the right kidney, no specific follow-up imaging is necessary. Musculoskeletal: No chest wall mass or suspicious bone lesions identified. IMPRESSION: 1. Stable postsurgical findings of prior left lower lobectomy. No evidence of recurrent or metastatic disease. 2. Stable bilateral pulmonary nodules. Recommend attention on follow-up. 3. Aortic Atherosclerosis (ICD10-I70.0) and Emphysema (ICD10-J43.9). Electronically Signed   By: Allegra Lai M.D.   On: 09/27/2022 18:55        PHYSICAL EXAMINATION: ECOG PERFORMANCE STATUS: 0 - Asymptomatic  Vitals:   10/27/22 1110  BP: (!) 158/74  Pulse: 73  Resp: 18  SpO2: 100%   Filed Weights   10/27/22 1110  Weight: 132 lb 12.8 oz (60.2 kg)    GENERAL:alert, no distress and comfortable No abnormalities noted on left cheek  LABORATORY DATA:  I have reviewed the data as listed    Component Value Date/Time   NA 140 05/06/2022 1126   NA 144 07/30/2018 1217   NA 144 05/29/2016 1119   K 3.9 05/06/2022 1126   K 4.4 05/29/2016 1119   CL 106 05/06/2022 1126   CO2 21 (L) 05/06/2022 1126   CO2 23 05/29/2016 1119   GLUCOSE 88 05/06/2022 1126   GLUCOSE 91 05/29/2016 1119   BUN 24 (H) 05/06/2022 1126   BUN 21 07/30/2018 1217   BUN 20.8 05/29/2016 1119   CREATININE 2.00 (H) 05/06/2022 1126    CREATININE 1.7 (H) 05/29/2016 1119   CALCIUM 9.7 05/06/2022 1126   CALCIUM 9.5 05/29/2016 1119   PROT 7.3 05/23/2021 1336   PROT 7.1 07/30/2018 1217   PROT 6.8 05/29/2016 1119   ALBUMIN 4.2 05/23/2021 1336   ALBUMIN 4.6 07/30/2018 1217   ALBUMIN 3.7 05/29/2016 1119   AST 24 05/23/2021 1336   AST 28 05/29/2016 1119   ALT 17 05/23/2021 1336   ALT 29 05/29/2016 1119   ALKPHOS 64 05/23/2021 1336   ALKPHOS 68 05/29/2016 1119   BILITOT 0.5 05/23/2021 1336   BILITOT 0.3 07/30/2018 1217   BILITOT 0.24 05/29/2016 1119   GFRNONAA 24 (L) 05/06/2022 1126   GFRAA 32 (L) 11/04/2018 1120    No results found for: "SPEP", "UPEP"  Lab Results  Component Value Date   WBC 7.5 05/06/2022   NEUTROABS 5.3 05/06/2022   HGB 11.7 (L) 05/06/2022   HCT 36.4 05/06/2022   MCV 95.0 05/06/2022   PLT 250 05/06/2022      Chemistry      Component Value Date/Time   NA 140 05/06/2022 1126   NA 144 07/30/2018 1217   NA 144 05/29/2016 1119   K 3.9 05/06/2022 1126   K 4.4 05/29/2016 1119   CL 106 05/06/2022 1126   CO2 21 (L) 05/06/2022 1126   CO2 23 05/29/2016 1119   BUN 24 (H) 05/06/2022 1126   BUN 21 07/30/2018 1217   BUN 20.8 05/29/2016 1119   CREATININE 2.00 (H) 05/06/2022 1126   CREATININE 1.7 (H) 05/29/2016 1119  Component Value Date/Time   CALCIUM 9.7 05/06/2022 1126   CALCIUM 9.5 05/29/2016 1119   ALKPHOS 64 05/23/2021 1336   ALKPHOS 68 05/29/2016 1119   AST 24 05/23/2021 1336   AST 28 05/29/2016 1119   ALT 17 05/23/2021 1336   ALT 29 05/29/2016 1119   BILITOT 0.5 05/23/2021 1336   BILITOT 0.3 07/30/2018 1217   BILITOT 0.24 05/29/2016 1119       RADIOGRAPHIC STUDIES: I reviewed multiple imaging studies with the patient and her daughter I have personally reviewed the radiological images as listed and agreed with the findings in the report. MR Brain W Wo Contrast  Result Date: 10/26/2022 CLINICAL DATA:  History of lung cancer with brain metastases EXAM: MRI HEAD WITHOUT AND  WITH CONTRAST TECHNIQUE: Multiplanar, multiecho pulse sequences of the brain and surrounding structures were obtained without and with intravenous contrast. CONTRAST:  6 cc [employer] COMPARISON:  Brain MRI 05/13/2022 FINDINGS: Brain: The enhancing lesion in the left cerebellar hemisphere is significantly decreased in size, currently measuring 1.3 cm x 0.7 cm x 1.1 cm (previously measured 2.7 cm x 2.2 cm x 2.4 cm). Surrounding FLAIR signal abnormality has markedly decreased. SWI signal dropout within the lesion is consistent with blood products. There is no residual mass effect or fourth ventricular effacement. There are no new lesions. There is no acute intracranial hemorrhage, extra-axial fluid collection, or acute infarct Parenchymal volume is stable and within normal limits. The ventricles are stable in size. Mild background chronic small-vessel ischemic change is stable. Encephalomalacia in the left anterior temporal lobe is unchanged with stable slight ex vacuo dilatation of the left temporal horn. There is no midline shift. Vascular: Normal flow voids. Skull and upper cervical spine: Normal marrow signal. Sinuses/Orbits: The paranasal sinuses are clear. Bilateral lens implants are in place. The globes and orbits are otherwise unremarkable. Other: The mastoid air cells and middle ear cavities are clear. IMPRESSION: Significantly decreased size of the enhancing left cerebellar lesion with essentially resolved surrounding edema and mass effect. No new lesions. Electronically Signed   By: Lesia Hausen M.D.   On: 10/26/2022 14:32

## 2022-10-31 ENCOUNTER — Encounter: Payer: Self-pay | Admitting: Internal Medicine

## 2022-10-31 ENCOUNTER — Inpatient Hospital Stay (HOSPITAL_BASED_OUTPATIENT_CLINIC_OR_DEPARTMENT_OTHER): Payer: 59 | Admitting: Internal Medicine

## 2022-10-31 VITALS — BP 138/64 | HR 66 | Temp 97.0°F | Resp 18 | Wt 129.9 lb

## 2022-10-31 DIAGNOSIS — C7931 Secondary malignant neoplasm of brain: Secondary | ICD-10-CM

## 2022-10-31 NOTE — Progress Notes (Signed)
[facility] at [facility] [address]  [place], [place] [address] [phone]   Interval Evaluation  Date of Service: 10/31/22 Patient Name: TERI KILMAN Patient MRN: [id number] Patient DOB: 1935/12/01 Provider: Henreitta Leber, MD  Identifying Statement:  ANALIZA FIEN is a 87 y.o. female with Malignant neoplasm metastatic to brain [facility])    Primary Cancer:  Oncologic History: Oncology History Overview Note  Non-small cell lung cancer, adenocarcinoma   Primary site: Lung (Left)   Staging method: AJCC 7th Edition   Clinical: Stage IB (T2a, N0, M0) signed by Artis Delay, MD on 04/29/2013  9:01 PM   Pathologic: Stage IB (T2a, N0, cM0) signed by Delight Ovens, MD on 04/09/2013 12:51 PM   Summary: Stage IB (T2a, N0, cM0) Molecular studies done on sample from 2105 showed no actionable mutations. MSI stable, low tumor mutation burden    History of lung cancer  02/26/2013 Imaging   CXR for evaluation of cough showed new left lung mass   02/28/2013 Imaging   CT chest showed left new lung nodule   03/14/2013 Imaging   PET/CT scan showed localized disease in the left lung   04/07/2013 Surgery   She underwent bronchoscopy and left lower lobe resection and lymph node sampling with negative margins   05/06/2013 Procedure   The patient has placement of Infuse-a-Port   05/21/2013 - 07/30/2013 Chemotherapy   She completed 4 cycles of adjuvant chemotherapy with navelbine and cisplatin   07/01/2013 Imaging   CT scan of the chest shows stable appearance.   07/02/2013 Adverse Reaction   Dose of chemotherapy was adjusted further due to severe anemia.   12/31/2013 Imaging   CT scan of the chest show no evidence of disease recurrence   07/03/2014 Imaging   Chest x-ray looks normal   12/31/2014 Imaging   Ct chest showed no evidence of recurrence   05/31/2015 Imaging   Status post resection of a left lower lobe lung lesion without findings for recurrent tumor or  metastatic disease.    05/29/2016 Imaging   1. Stable exam. Status post left lower lobectomy without evidence for new or progressive findings. 2. Emphysema. 3. Thoracic aortic atherosclerosis   05/30/2017 Imaging   1. Slight interval increase in size of right upper lobe nodule. Recommend attention on follow-up. 2. Stable appearance of the left hemithorax with left lower lobectomy. 3. Aortic Atherosclerosis (ICD10-I70.0) and Emphysema (ICD10-J43.9).   11/13/2018 Imaging   1. No evidence of recurrent or metastatic disease. 2. Clustered peribronchovascular nodularity in the right upper lobe appears stable. 3. 7 mm left upper lobe ground-glass nodule, stable. Continued attention on follow-up exams is warranted. 4. Aortic atherosclerosis (ICD10-170.0). Coronary artery calcification. 5.  Emphysema (ICD10-J43.9).   05/19/2022 PET scan   NM PET Image Restage (PS) Skull Base to Thigh (F-18 FDG)  Result Date: 05/19/2022 CLINICAL DATA:  Subsequent treatment strategy for metastatic lung cancer. EXAM: NUCLEAR MEDICINE PET SKULL BASE TO THIGH TECHNIQUE: 6.84 mCi F-18 FDG was injected intravenously. Full-ring PET imaging was performed from the skull base to thigh after the radiotracer. CT data was obtained and used for attenuation correction and anatomic localization. Fasting blood glucose: 98 mg/dl COMPARISON:  CT 44/01/270 FINDINGS: Mediastinal blood pool activity: SUV max 3.67 Liver activity: SUV max NA NECK: Mass within the cerebellum noted on MRI from 05/17/2022 is tracer avid within SUV max of 26.70. Incidental CT findings: No tracer avid cervical lymph nodes. Nodule in the left lobe of  thyroid gland containing calcifications measures 2.1 cm. No significant increased radiotracer uptake identified within this nodule. This has been evaluated on previous imaging. (ref: J Am Coll Radiol. 2015 Feb;12(2): 143-50). CHEST: No tracer avid mediastinal, hilar or axillary nodes. There are no tracer avid pulmonary  nodules identified. Subsolid nodule in the posterior left lower lobe measures 7 mm without significant tracer uptake, image 38/7. This is technically too small to reliably characterize however low by PET-CT. The irregular nodule within the periphery of the right upper lobe measuring 1 cm is stable from the previous exam without significant tracer uptake, image 20/7. Focal nodular thickening over the posterior lateral left lower lobe measures approximately 1.1 by 0.7 cm without significant tracer uptake, image 79/4. Incidental CT findings: Emphysema.Aortic atherosclerosis coronary artery calcifications. ABDOMEN/PELVIS: No abnormal tracer uptake identified within the liver, pancreas, spleen, or adrenal glands. Incidental CT findings: Aortic atherosclerosis. Small hiatal hernia. 3.7 cm right kidney cyst. No follow-up imaging recommended. SKELETON: No focal hypermetabolic activity to suggest skeletal metastasis. Incidental CT findings: None. IMPRESSION: 1. The mass within the cerebellum noted on MRI from 05/17/2022 is tracer avid with SUV max of 26.70. 2. No signs of tracer avid nodal metastasis or solid organ metastasis within the chest, abdomen or pelvis. 3. Stable small pulmonary nodules are identified without significant tracer uptake. 4. Coronary artery calcifications. 5.  Aortic Atherosclerosis (ICD10-I70.0). Electronically Signed   By: Signa Kell M.D.   On: 05/19/2022 09:28   MR Brain W Wo Contrast  Result Date: 05/18/2022 CLINICAL DATA:  Brain/CNS neoplasm, staging 3T SRS Protocol for radiation treatment planning. EXAM: MRI HEAD WITHOUT AND WITH CONTRAST TECHNIQUE: Multiplanar, multiecho pulse sequences of the brain and surrounding structures were obtained without and with intravenous contrast. CONTRAST:  6.5 mL Vueway. COMPARISON:  MRI brain 05/06/2022. FINDINGS: Brain: Unchanged 27 x 22 mm enhancing mass with peripheral hemosiderin staining and surrounding vasogenic edema centered within the medial  aspect of the left cerebellar hemisphere (image 44 series 13). No new enhancing lesions. Unchanged mass effect on the inferior fourth ventricle. Stable size and configuration of the third and lateral ventricles. No acute infarct or acute hemorrhage. Unchanged encephalomalacia in the left anterior temporal lobe, likely secondary to prior trauma. Vascular: Normal flow voids and enhancement. Skull and upper cervical spine: Normal marrow signal and enhancement. Sinuses/Orbits: Unremarkable. Other: None. IMPRESSION: 1. Unchanged 27 mm enhancing mass in the left cerebellar hemisphere with surrounding vasogenic edema. No new enhancing lesions. 2. Stable size and configuration of the third and lateral ventricles. Electronically Signed   By: Orvan Falconer M.D.   On: 05/18/2022 11:18   MR BRAIN W CONTRAST  Result Date: 05/06/2022 CLINICAL DATA:  Brain metastases, unknown primary EXAM: MRI HEAD WITH CONTRAST TECHNIQUE: Multiplanar, multiecho pulse sequences of the brain and surrounding structures were obtained with intravenous contrast. CONTRAST:  6.78mL GADAVIST GADOBUTROL 1 MMOL/ML IV SOLN COMPARISON:  Same day MRI head. FINDINGS: Postcontrast only imaging was performed to further evaluate the abnormality seen on same day noncontrast MRI. The mass within the left cerebellum is enhancing, compatible with a metastasis given the clinical history. No other enhancing lesions identified. Please see same day MRI head for further characterization, including effacement of the fourth ventricle in ventriculomegaly. IMPRESSION: Postcontrast only imaging. The mass within the left cerebellum is enhancing, compatible with a metastasis given the clinical history. No other enhancing lesions identified. Electronically Signed   By: Feliberto Harts M.D.   On: 05/06/2022 15:17   CT Chest Wo Contrast  Result Date: 05/06/2022 CLINICAL DATA:  History of lung cancer. New hemorrhagic mass in the cerebellum. Restage lung cancer. * Tracking  Code: BO * EXAM: CT CHEST, ABDOMEN AND PELVIS WITHOUT CONTRAST TECHNIQUE: Multidetector CT imaging of the chest, abdomen and pelvis was performed following the standard protocol without IV contrast. RADIATION DOSE REDUCTION: This exam was performed according to the departmental dose-optimization program which includes automated exposure control, adjustment of the mA and/or kV according to patient size and/or use of iterative reconstruction technique. COMPARISON:  CT chest from 11/28/2021 FINDINGS: CT CHEST FINDINGS Cardiovascular: Heart size is normal. Aortic atherosclerosis. Coronary artery calcifications. No pericardial effusion. Mediastinum/Nodes: Asymmetric heterogeneous enlargement of the left lobe of thyroid gland. This has been evaluated on previous imaging. (ref: J Am Coll Radiol. 2015 Feb;12(2): 143-50). Trachea appears patent and midline. Normal appearance of the esophagus. No mediastinal or axillary adenopathy. Hilar structures are suboptimally evaluated due to lack of IV contrast. Lungs/Pleura: Central airways appear patent. No pleural effusion or airspace consolidation. Mild to moderate emphysema. Status post left lower lobectomy. Scattered lung nodules are identified, including: -within the periphery of the right lower lobe there is an irregular nodule measuring 1 cm, image 63/4. Previously this measured 0.9 cm. -Sub solid nodule in the posterior left lower lung measures 0.8 cm, image 93/4. Formally 0.6 cm. -Subpleural nodular density over the posterolateral left lower lung measures 1.2 x 0.5 cm, image 95/4. Previously this measured the same. Musculoskeletal: Status post ACDF within the cervical spine. No acute or suspicious bone lesions. No chest wall mass. CT ABDOMEN PELVIS FINDINGS Hepatobiliary: There are 2 small low-attenuation foci within the left lobe of liver and posteromedial right lobe of liver measuring up to 8 mm. These are technically too small to reliably characterize but appear  unchanged. Gallbladder is unremarkable. Pancreas: Unremarkable. No pancreatic ductal dilatation or surrounding inflammatory changes. Spleen: Normal in size without focal abnormality. Adrenals/Urinary Tract: Normal appearance of the adrenal glands. No nephrolithiasis or hydronephrosis. Bilateral simple appearing kidney cysts are noted. The largest is in the lateral cortex of the right kidney measuring 3.6 cm, image 65/3. No follow-up imaging recommended. No hydronephrosis identified bilaterally. Urinary bladder appears normal. Stomach/Bowel: Small hiatal hernia. Stomach is nondistended. The appendix is visualized and appears normal. No pathologic dilatation of the large or small bowel loops. Distal colonic diverticula noted without signs of acute diverticulitis. Vascular/Lymphatic: Aortic atherosclerosis. No aneurysm. No signs of abdominopelvic adenopathy. Reproductive: Partially calcified fibroid is noted anteriorly measuring 1.9 cm. No adnexal mass identified. Other: No ascites or focal fluid collections. No signs of peritoneal nodularity or mass. Musculoskeletal: Previous PLIF at L4-5. Degenerative disc disease is noted at the remaining lumbar spine levels. Bilateral facet arthropathy. No acute or suspicious osseous findings. IMPRESSION: 1. Several bilateral lung nodules are again noted. There has been no significant change in the size of these nodules (within 1-2 mm). No new pulmonary lesions identified when compared with previous exam. 2. No evidence for metastatic disease to the abdomen or pelvis. 3. Coronary artery calcifications. 4. Aortic Atherosclerosis (ICD10-I70.0) and Emphysema (ICD10-J43.9). Electronically Signed   By: Signa Kell M.D.   On: 05/06/2022 13:56   CT ABDOMEN PELVIS WO CONTRAST  Result Date: 05/06/2022 CLINICAL DATA:  History of lung cancer. New hemorrhagic mass in the cerebellum. Restage lung cancer. * Tracking Code: BO * EXAM: CT CHEST, ABDOMEN AND PELVIS WITHOUT CONTRAST TECHNIQUE:  Multidetector CT imaging of the chest, abdomen and pelvis was performed following the standard protocol without IV contrast. RADIATION  DOSE REDUCTION: This exam was performed according to the departmental dose-optimization program which includes automated exposure control, adjustment of the mA and/or kV according to patient size and/or use of iterative reconstruction technique. COMPARISON:  CT chest from 11/28/2021 FINDINGS: CT CHEST FINDINGS Cardiovascular: Heart size is normal. Aortic atherosclerosis. Coronary artery calcifications. No pericardial effusion. Mediastinum/Nodes: Asymmetric heterogeneous enlargement of the left lobe of thyroid gland. This has been evaluated on previous imaging. (ref: J Am Coll Radiol. 2015 Feb;12(2): 143-50). Trachea appears patent and midline. Normal appearance of the esophagus. No mediastinal or axillary adenopathy. Hilar structures are suboptimally evaluated due to lack of IV contrast. Lungs/Pleura: Central airways appear patent. No pleural effusion or airspace consolidation. Mild to moderate emphysema. Status post left lower lobectomy. Scattered lung nodules are identified, including: -within the periphery of the right lower lobe there is an irregular nodule measuring 1 cm, image 63/4. Previously this measured 0.9 cm. -Sub solid nodule in the posterior left lower lung measures 0.8 cm, image 93/4. Formally 0.6 cm. -Subpleural nodular density over the posterolateral left lower lung measures 1.2 x 0.5 cm, image 95/4. Previously this measured the same. Musculoskeletal: Status post ACDF within the cervical spine. No acute or suspicious bone lesions. No chest wall mass. CT ABDOMEN PELVIS FINDINGS Hepatobiliary: There are 2 small low-attenuation foci within the left lobe of liver and posteromedial right lobe of liver measuring up to 8 mm. These are technically too small to reliably characterize but appear unchanged. Gallbladder is unremarkable. Pancreas: Unremarkable. No pancreatic  ductal dilatation or surrounding inflammatory changes. Spleen: Normal in size without focal abnormality. Adrenals/Urinary Tract: Normal appearance of the adrenal glands. No nephrolithiasis or hydronephrosis. Bilateral simple appearing kidney cysts are noted. The largest is in the lateral cortex of the right kidney measuring 3.6 cm, image 65/3. No follow-up imaging recommended. No hydronephrosis identified bilaterally. Urinary bladder appears normal. Stomach/Bowel: Small hiatal hernia. Stomach is nondistended. The appendix is visualized and appears normal. No pathologic dilatation of the large or small bowel loops. Distal colonic diverticula noted without signs of acute diverticulitis. Vascular/Lymphatic: Aortic atherosclerosis. No aneurysm. No signs of abdominopelvic adenopathy. Reproductive: Partially calcified fibroid is noted anteriorly measuring 1.9 cm. No adnexal mass identified. Other: No ascites or focal fluid collections. No signs of peritoneal nodularity or mass. Musculoskeletal: Previous PLIF at L4-5. Degenerative disc disease is noted at the remaining lumbar spine levels. Bilateral facet arthropathy. No acute or suspicious osseous findings. IMPRESSION: 1. Several bilateral lung nodules are again noted. There has been no significant change in the size of these nodules (within 1-2 mm). No new pulmonary lesions identified when compared with previous exam. 2. No evidence for metastatic disease to the abdomen or pelvis. 3. Coronary artery calcifications. 4. Aortic Atherosclerosis (ICD10-I70.0) and Emphysema (ICD10-J43.9). Electronically Signed   By: Signa Kell M.D.   On: 05/06/2022 13:56   MR BRAIN WO CONTRAST  Result Date: 05/06/2022 CLINICAL DATA:  Memory impairment.  Recent headaches and imbalance EXAM: MRI HEAD WITHOUT CONTRAST TECHNIQUE: Multiplanar, multiecho pulse sequences of the brain and surrounding structures were obtained without intravenous contrast. COMPARISON:  10/16/2005 FINDINGS:  Brain: Isointense mass with hemorrhagic features (hemosiderin at its inferior periphery) measuring up to 2.7 cm. Moderate vasogenic edema in the mass causes effacement of the lower fourth ventricle with third and lateral ventriculomegaly. Mild periventricular FLAIR hyperintensity at the lateral ventricles which is more likely chronic small vessel ischemia than transependymal flow given incomplete appearance in the degree of ventriculomegaly. Gliosis and volume loss at the anterior  left temporal lobe which was also seen on prior, possibly posttraumatic in this location. Case discussed with the patient's daughter who is driving the patient and ER referral has been made. [facility] made aware. Vascular: Major flow voids are preserved Skull and upper cervical spine: Normal marrow signal. Sinuses/Orbits: No significant finding. Other: Small proteinaceous cystic intensity anterior to the hyoid, consistent with thyroglossal duct cyst measuring 1 cm. IMPRESSION: Nearly 3 cm hemorrhagic mass with vasogenic edema in the lower cerebellum. There is effacement of the fourth ventricle with ventriculomegaly. Presumed metastasis in this patient with history malignancy, recommend postcontrast assessment. Electronically Signed   By: Tiburcio Pea M.D.   On: 05/06/2022 09:48      05/26/2022 - 06/05/2022 Radiation Therapy   First Treatment Date: 2022-05-26 - Last Treatment Date: 2022-06-05   Plan Name: Brain_SRT Site: Brain Technique: SBRT/SRT-IMRT Mode: Photon Dose Per Fraction: 9 Gy Prescribed Dose (Delivered / Prescribed): 27 Gy / 27 Gy Prescribed Fxs (Delivered / Prescribed): 3 / 3   09/28/2022 Imaging   CT CHEST WO CONTRAST  Result Date: 09/27/2022 CLINICAL DATA:  Non-small cell lung cancer; * Tracking Code: BO * EXAM: CT CHEST WITHOUT CONTRAST TECHNIQUE: Multidetector CT imaging of the chest was performed following the standard protocol without IV contrast. RADIATION DOSE REDUCTION: This exam was performed  according to the departmental dose-optimization program which includes automated exposure control, adjustment of the mA and/or kV according to patient size and/or use of iterative reconstruction technique. COMPARISON:  Multiple priors, most recent PET-CT dated May 18, 2022 and chest CT dated April 13th 2024 FINDINGS: Cardiovascular: Normal heart size. No pericardial effusion. Normal caliber thoracic aorta with severe calcified plaque. Severe coronary artery calcifications. Mediastinum/Nodes: Esophagus is unremarkable. Multinodular left thyroid goiter unchanged when compared with the prior exam. No enlarged lymph nodes seen in the chest. Lungs/Pleura: Central airways are patent. Moderate centrilobular emphysema. Stable postsurgical findings of prior left lower lobectomy. Stable subpleural linear opacities of the lower left lung, likely due to scarring. Scattered bilateral pulmonary nodules are stable. Reference irregular solid nodule of the right upper lobe measuring 10 mm with possible small ground-glass component located on series 6, image 49, unchanged. Stable irregular solid nodule of the left lung located on image 80. Stable ground-glass nodule of the right upper lobe measuring 9 mm on series 6, image 39. No pleural effusion. Upper Abdomen: Partially visualized simple appearing cyst of the right kidney, no specific follow-up imaging is necessary. Musculoskeletal: No chest wall mass or suspicious bone lesions identified. IMPRESSION: 1. Stable postsurgical findings of prior left lower lobectomy. No evidence of recurrent or metastatic disease. 2. Stable bilateral pulmonary nodules. Recommend attention on follow-up. 3. Aortic Atherosclerosis (ICD10-I70.0) and Emphysema (ICD10-J43.9). Electronically Signed   By: Allegra Lai M.D.   On: 09/27/2022 18:55       CNS Oncologic History 06/05/22: Completes fractionated SRS to cerebellar metastasis [facility])  Interval History: LOURI JURGENSON presents today for  follow up.  No new or progressive changes.  Has weaned off the decadron.  Continues on observation for lung cancer with Dr. Bertis Ruddy.  H+P (05/08/22) Patient presents today to review recent MRI, CNS complaints.  She describes several weeks history of headaches, new from prior.  She attributes them to chemicals sprayed in her house for fleas and bugs, noting that the headache actually resolved once the spray was discontinued last week.  PCP ordered a brain MRI, which demonstrated an enhancing mass in the posterior fossa.  She denies any  issues with balance, gait is independent.  No other systemic complains, hasn't followed up regarding history of lung cancer since 2020.  Medications: Current Outpatient Medications on File Prior to Visit  Medication Sig Dispense Refill   acetaminophen (TYLENOL) 500 MG tablet Take 1 tablet (500 mg total) by mouth every 6 (six) hours as needed. 30 tablet 0   atorvastatin (LIPITOR) 10 MG tablet TAKE 1 TABLET (10 MG TOTAL) BY MOUTH DAILY AT 6 PM. 90 tablet 3   chlorthalidone (HYGROTON) 25 MG tablet TAKE 1 TABLET BY MOUTH EVERY DAY 90 tablet 3   Cholecalciferol (VITAMIN D-3) 1000 units CAPS Take 1 capsule (1,000 Units total) by mouth daily. 90 capsule 1   clopidogrel (PLAVIX) 75 MG tablet TAKE 1 TABLET BY MOUTH EVERY DAY WITH BREAKFAST 90 tablet 2   gabapentin (NEURONTIN) 300 MG capsule Take 300 mg by mouth daily.     LORazepam (ATIVAN) 0.5 MG tablet Take 1 tablet (0.5 mg total) by mouth 2 (two) times daily as needed for anxiety. 4 tablet 0   magnesium oxide (MAG-OX) 400 MG tablet Take 1 tablet (400 mg total) by mouth daily. 90 tablet 1   methocarbamol (ROBAXIN) 500 MG tablet TAKE 1 TABLET BY MOUTH AT BEDTIME AS NEEDED FOR MUSCLE SPASMS. 30 tablet 2   metoprolol succinate (TOPROL-XL) 25 MG 24 hr tablet TAKE 1 TABLET (25 MG) BY ORAL ROUTE TWICE A DAY 180 tablet 3   Multiple Vitamin (MULTIVITAMIN) tablet Take 1 tablet by mouth at bedtime.      telmisartan (MICARDIS) 40 MG  tablet TAKE 1 TABLET BY MOUTH EVERY DAY 90 tablet 3   No current facility-administered medications on file prior to visit.    Allergies:  Allergies  Allergen Reactions   Aspirin Other (See Comments)    REACTION: stomach upset   Lactose Intolerance (Gi) Diarrhea and Other (See Comments)    "can't drink milk. It tears my stomach up."   Adhesive [Tape] Rash   Lisinopril Other (See Comments)    cough   Percocet [Oxycodone-Acetaminophen] Itching   Past Medical History:  Past Medical History:  Diagnosis Date   Arthritis    Benign hypertensive kidney disease with chronic kidney disease stage I through stage IV, or unspecified(403.10)    sees dr. Hyman Hopes again in april   Cancer [facility])    lung ca dx'd 02/2013   Chronic kidney disease, stage III (moderate) (HCC)    Cough 02/26/2013   GERD (gastroesophageal reflux disease)    Headache    Hypertension    Hyperthyroidism    took iodine treatment for this   Lung mass 02/27/2013   Neurologic disorder    2009   Shortness of breath    exertion   Thrombocytopenia, unspecified (HCC) 02/26/2013   Unspecified vitamin D deficiency    Past Surgical History:  Past Surgical History:  Procedure Laterality Date   BACK SURGERY     EYE SURGERY     cataract   EYE SURGERY Left    open up tear duct   LYMPH NODE DISSECTION Left 04/07/2013   Procedure: LYMPH NODE DISSECTION;  Surgeon: Delight Ovens, MD;  Location: [facility] OR;  Service: Thoracic;  Laterality: Left;   NECK SURGERY     PORT-A-CATH REMOVAL Left 01/09/2014   Procedure: REMOVAL PORT-A-CATH;  Surgeon: Delight Ovens, MD;  Location[facility] OR;  Service: Thoracic;  Laterality: Left;   PORTACATH PLACEMENT Left 05/06/2013   Procedure: INSERTION PORT-A-CATH;  Surgeon: Delight Ovens, MD;  Location: [facility] OR;  Service: Thoracic;  Laterality: Left;   VIDEO ASSISTED THORACOSCOPY (VATS)/ LOBECTOMY Left 04/07/2013   Procedure: VIDEO ASSISTED THORACOSCOPY (VATS)/ LOBECTOMY; INSERTION OF ON-Q PUMP;  Surgeon:  Delight Ovens, MD;  Location: [facility] OR;  Service: Thoracic;  Laterality: Left;   VIDEO BRONCHOSCOPY N/A 04/07/2013   Procedure: VIDEO BRONCHOSCOPY;  Surgeon: Delight Ovens, MD;  Location: [facility] OR;  Service: Thoracic;  Laterality: N/A;   Social History:  Social History   Socioeconomic History   Marital status: Single    Spouse name: Not on file   Number of children: 2   Years of education: 4   Highest education level: Not on file  Occupational History   Not on file  Tobacco Use   Smoking status: Former    Current packs/day: 0.00    Average packs/day: 0.5 packs/day for 60.0 years (30.0 ttl pk-yrs)    Types: Cigarettes    Start date: 02/25/1950    Quit date: 02/25/2012    Years since quitting: 10.6   Smokeless tobacco: Never  Vaping Use   Vaping status: Never Used  Substance and Sexual Activity   Alcohol use: No   Drug use: No   Sexual activity: Not on file  Other Topics Concern   Not on file  Social History Narrative   Right handed   Drinks caffeine   One story home   Lives alone   retired   Chief Executive Officer Determinants of Health   Financial Resource Strain: Low Risk  (11/11/2020)   Overall Financial Resource Strain (CARDIA)    Difficulty of Paying Living Expenses: Not hard at all  Food Insecurity: No Food Insecurity (05/19/2022)   Hunger Vital Sign    Worried About Running Out of Food in the Last Year: Never true    Ran Out of Food in the Last Year: Never true  Transportation Needs: No Transportation Needs (05/08/2022)   PRAPARE - Administrator, Civil Service (Medical): No    Lack of Transportation (Non-Medical): No  Physical Activity: Sufficiently Active (11/11/2020)   Exercise Vital Sign    Days of Exercise per Week: 5 days    Minutes of Exercise per Session: 30 min  Stress: No Stress Concern Present (11/11/2020)   [employer] of Occupational Health - Occupational Stress Questionnaire    Feeling of Stress : Not at all  Social Connections: Moderately  Integrated (11/11/2020)   Social Connection and Isolation Panel [NHANES]    Frequency of Communication with Friends and Family: More than three times a week    Frequency of Social Gatherings with Friends and Family: More than three times a week    Attends Religious Services: More than 4 times per year    Active Member of [employer] or Organizations: Yes    Attends Banker Meetings: More than 4 times per year    Marital Status: Never married  Intimate Partner Violence: Not At Risk (05/19/2022)   Humiliation, Afraid, Rape, and Kick questionnaire    Fear of Current or Ex-Partner: No    Emotionally Abused: No    Physically Abused: No    Sexually Abused: No   Family History:  Family History  Problem Relation Age of Onset   Breast cancer Mother    Lung cancer Father        smoked   Lung cancer Brother        smoked   Rectal cancer Neg Hx    Stomach cancer Neg Hx  Esophageal cancer Neg Hx    Colon cancer Neg Hx     Review of Systems: Constitutional: Doesn't report fevers, chills or abnormal weight loss Eyes: Doesn't report blurriness of vision Ears, nose, mouth, throat, and face: Doesn't report sore throat Respiratory: Doesn't report cough, dyspnea or wheezes Cardiovascular: Doesn't report palpitation, chest discomfort  Gastrointestinal:  Doesn't report nausea, constipation, diarrhea GU: Doesn't report incontinence Skin: Doesn't report skin rashes Neurological: Per HPI Musculoskeletal: Doesn't report joint pain Behavioral/Psych: Doesn't report anxiety  Physical Exam: Vitals:   10/31/22 1125 10/31/22 1132  BP: (!) 167/75 138/64  Pulse: 66   Resp: 18   Temp: (!) 97 F (36.1 C)   SpO2: 99%     KPS: 80. General: Alert, cooperative, pleasant, in no acute distress Head: Normal EENT: No conjunctival injection or scleral icterus.  Lungs: Resp effort normal Cardiac: Regular rate Abdomen: Non-distended abdomen Skin: No rashes cyanosis or petechiae. Extremities: No  clubbing or edema  Neurologic Exam: Mental Status: Awake, alert, attentive to examiner. Oriented to self and environment. Language is fluent with intact comprehension.  Cranial Nerves: Visual acuity is grossly normal. Visual fields are full. Extra-ocular movements intact. No ptosis. Face is symmetric Motor: Tone and bulk are normal. Power is full in both arms and legs. Reflexes are symmetric, no pathologic reflexes present.  Sensory: Intact to light touch Gait: Normal.   Labs: I have reviewed the data as listed    Component Value Date/Time   NA 140 05/06/2022 1126   NA 144 07/30/2018 1217   NA 144 05/29/2016 1119   K 3.9 05/06/2022 1126   K 4.4 05/29/2016 1119   CL 106 05/06/2022 1126   CO2 21 (L) 05/06/2022 1126   CO2 23 05/29/2016 1119   GLUCOSE 88 05/06/2022 1126   GLUCOSE 91 05/29/2016 1119   BUN 24 (H) 05/06/2022 1126   BUN 21 07/30/2018 1217   BUN 20.8 05/29/2016 1119   CREATININE 2.00 (H) 05/06/2022 1126   CREATININE 1.7 (H) 05/29/2016 1119   CALCIUM 9.7 05/06/2022 1126   CALCIUM 9.5 05/29/2016 1119   PROT 7.3 05/23/2021 1336   PROT 7.1 07/30/2018 1217   PROT 6.8 05/29/2016 1119   ALBUMIN 4.2 05/23/2021 1336   ALBUMIN 4.6 07/30/2018 1217   ALBUMIN 3.7 05/29/2016 1119   AST 24 05/23/2021 1336   AST 28 05/29/2016 1119   ALT 17 05/23/2021 1336   ALT 29 05/29/2016 1119   ALKPHOS 64 05/23/2021 1336   ALKPHOS 68 05/29/2016 1119   BILITOT 0.5 05/23/2021 1336   BILITOT 0.3 07/30/2018 1217   BILITOT 0.24 05/29/2016 1119   GFRNONAA 24 (L) 05/06/2022 1126   GFRAA 32 (L) 11/04/2018 1120   Lab Results  Component Value Date   WBC 7.5 05/06/2022   NEUTROABS 5.3 05/06/2022   HGB 11.7 (L) 05/06/2022   HCT 36.4 05/06/2022   MCV 95.0 05/06/2022   PLT 250 05/06/2022    Imaging:  MR Brain W Wo Contrast  Result Date: 10/26/2022 CLINICAL DATA:  History of lung cancer with brain metastases EXAM: MRI HEAD WITHOUT AND WITH CONTRAST TECHNIQUE: Multiplanar, multiecho pulse  sequences of the brain and surrounding structures were obtained without and with intravenous contrast. CONTRAST:  6 cc Vueway COMPARISON:  Brain MRI 05/13/2022 FINDINGS: Brain: The enhancing lesion in the left cerebellar hemisphere is significantly decreased in size, currently measuring 1.3 cm x 0.7 cm x 1.1 cm (previously measured 2.7 cm x 2.2 cm x 2.4 cm). Surrounding FLAIR signal abnormality has  markedly decreased. SWI signal dropout within the lesion is consistent with blood products. There is no residual mass effect or fourth ventricular effacement. There are no new lesions. There is no acute intracranial hemorrhage, extra-axial fluid collection, or acute infarct Parenchymal volume is stable and within normal limits. The ventricles are stable in size. Mild background chronic small-vessel ischemic change is stable. Encephalomalacia in the left anterior temporal lobe is unchanged with stable slight ex vacuo dilatation of the left temporal horn. There is no midline shift. Vascular: Normal flow voids. Skull and upper cervical spine: Normal marrow signal. Sinuses/Orbits: The paranasal sinuses are clear. Bilateral lens implants are in place. The globes and orbits are otherwise unremarkable. Other: The mastoid air cells and middle ear cavities are clear. IMPRESSION: Significantly decreased size of the enhancing left cerebellar lesion with essentially resolved surrounding edema and mass effect. No new lesions. Electronically Signed   By: Lesia Hausen M.D.   On: 10/26/2022 14:32      Assessment/Plan Malignant neoplasm metastatic to brain [facility])  Kylah H Badman is clinically stable today, now s/p 4 months SRS to posterior fossa metastasis.  MRI demonstrates stable findings, reduction in enhancement and surrounding edema.    We appreciate the opportunity to participate in the care of WESTLYN HINDLEY.  We ask that ALONZO MCLENNAN return to clinic in 4 months following next brain MRI, or sooner as needed.  All  questions were answered. The patient knows to call the clinic with any problems, questions or concerns. No barriers to learning were detected.  The total time spent in the encounter was 30 minutes and more than 50% was on counseling and review of test results   Henreitta Leber, MD Medical Director of Neuro-Oncology [facility] at [facility] 10/31/22 11:08 AM

## 2022-11-07 ENCOUNTER — Ambulatory Visit (INDEPENDENT_AMBULATORY_CARE_PROVIDER_SITE_OTHER): Payer: 59 | Admitting: Physician Assistant

## 2022-11-07 ENCOUNTER — Encounter: Payer: Self-pay | Admitting: Physician Assistant

## 2022-11-07 VITALS — BP 182/84 | HR 74 | Resp 20 | Ht 60.0 in

## 2022-11-07 DIAGNOSIS — R413 Other amnesia: Secondary | ICD-10-CM | POA: Diagnosis not present
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
## 2022-11-07 NOTE — Patient Instructions (Signed)
It was a pleasure to see you today at our office.   Recommendations:   Follow up in 6 months    Consider memory pill with goal to low down any memory loss      RECOMMENDATIONS FOR ALL PATIENTS WITH MEMORY PROBLEMS: 1. Continue to exercise (Recommend 30 minutes of walking everyday, or 3 hours every week) 2. Increase social interactions - continue going to [place] and enjoy social gatherings with friends and family 3. Eat healthy, avoid fried foods and eat more fruits and vegetables 4. Maintain adequate blood pressure, blood sugar, and blood cholesterol level. Reducing the risk of stroke and cardiovascular disease also helps promoting better memory. 5. Avoid stressful situations. Live a simple life and avoid aggravations. Organize your time and prepare for the next day in anticipation. 6. Sleep well, avoid any interruptions of sleep and avoid any distractions in the bedroom that may interfere with adequate sleep quality 7. Avoid sugar, avoid sweets as there is a strong link between excessive sugar intake, diabetes, and cognitive impairment We discussed the Mediterranean diet, which has been shown to help patients reduce the risk of progressive memory disorders and reduces cardiovascular risk. This includes eating fish, eat fruits and green leafy vegetables, nuts like almonds and hazelnuts, walnuts, and also use olive oil. Avoid fast foods and fried foods as much as possible. Avoid sweets and sugar as sugar use has been linked to worsening of memory function.  There is always a concern of gradual progression of memory problems. If this is the case, then we may need to adjust level of care according to patient needs. Support, both to the patient and caregiver, should then be put into place.         FALL PRECAUTIONS: Be cautious when walking. Scan the area for obstacles that may increase the risk of trips and falls. When getting up in the mornings, sit up at the edge of the bed for a few minutes  before getting out of bed. Consider elevating the bed at the head end to avoid drop of blood pressure when getting up. Walk always in a well-lit room (use night lights in the walls). Avoid area rugs or power cords from appliances in the middle of the walkways. Use a walker or a cane if necessary and consider physical therapy for balance exercise. Get your eyesight checked regularly.  FINANCIAL OVERSIGHT: Supervision, especially oversight when making financial decisions or transactions is also recommended.  HOME SAFETY: Consider the safety of the kitchen when operating appliances like stoves, microwave oven, and blender. Consider having supervision and share cooking responsibilities until no longer able to participate in those. Accidents with firearms and other hazards in the house should be identified and addressed as well.   ABILITY TO BE LEFT ALONE: If patient is unable to contact 911 operator, consider using LifeLine, or when the need is there, arrange for someone to stay with patients. Smoking is a fire hazard, consider supervision or cessation. Risk of wandering should be assessed by caregiver and if detected at any point, supervision and safe proof recommendations should be instituted.  MEDICATION SUPERVISION: Inability to self-administer medication needs to be constantly addressed. Implement a mechanism to ensure safe administration of the medications.   DRIVING: Regarding driving, in patients with progressive memory problems, driving will be impaired. We advise to have someone else do the driving if trouble finding directions or if minor accidents are reported. Independent driving assessment is available to determine safety of driving.   If  you are interested in the driving assessment, you can contact the following:  [employer] in [place] [phone]  [facility] [phone]  [facility] [phone] or  [phone]    Mediterranean Diet A Mediterranean diet refers to food and lifestyle choices that are based on the traditions of countries located on the [employer]. This way of eating has been shown to help prevent certain conditions and improve outcomes for people who have chronic diseases, like kidney disease and heart disease. What are tips for following this plan? Lifestyle  Cook and eat meals together with your family, when possible. Drink enough fluid to keep your urine clear or pale yellow. Be physically active every day. This includes: Aerobic exercise like running or swimming. Leisure activities like gardening, walking, or housework. Get 7-8 hours of sleep each night. If recommended by your health care provider, drink red wine in moderation. This means 1 glass a day for nonpregnant women and 2 glasses a day for men. A glass of wine equals 5 oz (150 mL). Reading food labels  Check the serving size of packaged foods. For foods such as rice and pasta, the serving size refers to the amount of cooked product, not dry. Check the total fat in packaged foods. Avoid foods that have saturated fat or trans fats. Check the ingredients list for added sugars, such as corn syrup. Shopping  At the grocery store, buy most of your food from the areas near the walls of the store. This includes: Fresh fruits and vegetables (produce). Grains, beans, nuts, and seeds. Some of these may be available in unpackaged forms or large amounts (in bulk). Fresh seafood. Poultry and eggs. Low-fat dairy products. Buy whole ingredients instead of prepackaged foods. Buy fresh fruits and vegetables in-season from local farmers markets. Buy frozen fruits and vegetables in resealable bags. If you do not have access to quality fresh seafood, buy precooked frozen shrimp or canned fish, such as tuna, salmon, or sardines. Buy small amounts of raw or cooked vegetables, salads, or olives from the deli or salad bar  at your store. Stock your pantry so you always have certain foods on hand, such as olive oil, canned tuna, canned tomatoes, rice, pasta, and beans. Cooking  Cook foods with extra-virgin olive oil instead of using butter or other vegetable oils. Have meat as a side dish, and have vegetables or grains as your main dish. This means having meat in small portions or adding small amounts of meat to foods like pasta or stew. Use beans or vegetables instead of meat in common dishes like chili or lasagna. Experiment with different cooking methods. Try roasting or broiling vegetables instead of steaming or sauteing them. Add frozen vegetables to soups, stews, pasta, or rice. Add nuts or seeds for added healthy fat at each meal. You can add these to yogurt, salads, or vegetable dishes. Marinate fish or vegetables using olive oil, lemon juice, garlic, and fresh herbs. Meal planning  Plan to eat 1 vegetarian meal one day each week. Try to work up to 2 vegetarian meals, if possible. Eat seafood 2 or more times a week. Have healthy snacks readily available, such as: Vegetable sticks with hummus. Greek yogurt. Fruit and nut trail mix. Eat balanced meals throughout the week. This includes: Fruit: 2-3 servings a day Vegetables: 4-5 servings a day Low-fat dairy: 2 servings a day Fish, poultry, or lean meat: 1 serving a day Beans and legumes: 2 or more servings  a week Nuts and seeds: 1-2 servings a day Whole grains: 6-8 servings a day Extra-virgin olive oil: 3-4 servings a day Limit red meat and sweets to only a few servings a month What are my food choices? Mediterranean diet Recommended Grains: Whole-grain pasta. Brown rice. Bulgar wheat. Polenta. Couscous. Whole-wheat bread. Orpah Cobb. Vegetables: Artichokes. Beets. Broccoli. Cabbage. Carrots. Eggplant. Green beans. Chard. Kale. Spinach. Onions. Leeks. Peas. Squash. Tomatoes. Peppers. Radishes. Fruits: Apples. Apricots. Avocado. Berries.  Bananas. Cherries. Dates. Figs. Grapes. Lemons. Melon. Oranges. Peaches. Plums. Pomegranate. Meats and other protein foods: Beans. Almonds. Sunflower seeds. Pine nuts. Peanuts. Cod. Salmon. Scallops. Shrimp. Tuna. Tilapia. Clams. Oysters. Eggs. Dairy: Low-fat milk. Cheese. Greek yogurt. Beverages: Water. Red wine. Herbal tea. Fats and oils: Extra virgin olive oil. Avocado oil. Grape seed oil. Sweets and desserts: [place] yogurt with honey. Baked apples. Poached pears. Trail mix. Seasoning and other foods: Basil. Cilantro. Coriander. Cumin. Mint. Parsley. Sage. Rosemary. Tarragon. Garlic. Oregano. Thyme. Pepper. Balsalmic vinegar. Tahini. Hummus. Tomato sauce. Olives. Mushrooms. Limit these Grains: Prepackaged pasta or rice dishes. Prepackaged cereal with added sugar. Vegetables: Deep fried potatoes (french fries). Fruits: Fruit canned in syrup. Meats and other protein foods: Beef. Pork. Lamb. Poultry with skin. Hot dogs. Tomasa Blase. Dairy: Ice cream. Sour cream. Whole milk. Beverages: Juice. Sugar-sweetened soft drinks. Beer. Liquor and spirits. Fats and oils: Butter. Canola oil. Vegetable oil. Beef fat (tallow). Lard. Sweets and desserts: Cookies. Cakes. Pies. Candy. Seasoning and other foods: Mayonnaise. Premade sauces and marinades. The items listed may not be a complete list. Talk with your dietitian about what dietary choices are right for you. Summary The Mediterranean diet includes both food and lifestyle choices. Eat a variety of fresh fruits and vegetables, beans, nuts, seeds, and whole grains. Limit the amount of red meat and sweets that you eat. Talk with your health care provider about whether it is safe for you to drink red wine in moderation. This means 1 glass a day for nonpregnant women and 2 glasses a day for men. A glass of wine equals 5 oz (150 mL). This information is not intended to replace advice given to you by your health care provider. Make sure you discuss any questions you  have with your health care provider. Document Released: 09/02/2015 Document Revised: 10/05/2015 Document Reviewed: 09/02/2015 Elsevier Interactive Patient Education  2017 [employer].   Labs today [address] [place] [phone]

## 2022-12-25 ENCOUNTER — Ambulatory Visit: Payer: 59 | Admitting: Emergency Medicine

## 2022-12-26 ENCOUNTER — Ambulatory Visit: Payer: 59 | Admitting: Emergency Medicine

## 2023-01-18 ENCOUNTER — Ambulatory Visit
Admission: RE | Admit: 2023-01-18 | Discharge: 2023-01-18 | Disposition: A | Discharge status: Home / Self Care | Payer: 59 | Source: Ambulatory Visit | Attending: Emergency Medicine | Admitting: Emergency Medicine

## 2023-01-18 DIAGNOSIS — Z1231 Encounter for screening mammogram for malignant neoplasm of breast: Secondary | ICD-10-CM

## 2023-01-26 ENCOUNTER — Other Ambulatory Visit: Payer: Self-pay | Admitting: Radiology

## 2023-01-26 ENCOUNTER — Telehealth: Payer: Self-pay | Admitting: Emergency Medicine

## 2023-01-26 ENCOUNTER — Other Ambulatory Visit: Payer: Self-pay | Admitting: Emergency Medicine

## 2023-01-26 DIAGNOSIS — M62838 Other muscle spasm: Secondary | ICD-10-CM

## 2023-01-26 MED ORDER — METHOCARBAMOL 500 MG PO TABS: 500.0000 mg | ORAL_TABLET | ORAL | 2 refills | Status: DC | PRN

## 2023-01-26 NOTE — Telephone Encounter (Signed)
 Copied from CRM [id number]. Topic: Clinical - Medication Refill >> Jan 26, 2023 10:30 AM Isabell A wrote: Most Recent Primary Care Visit:  Provider: PURCELL EMIL SCHANZ  Department: [facility]  Visit Type: OFFICE VISIT  Date: 09/20/2022  Medication: methocarbamol  (ROBAXIN ) 500 MG tablet  Has the patient contacted their pharmacy? Yes (Agent: If no, request that the patient contact the pharmacy for the refill. If patient does not wish to contact the pharmacy document the reason why and proceed with request.) (Agent: If yes, when and what did the pharmacy advise?)  Is this the correct pharmacy for this prescription?  If no, delete pharmacy and type the correct one.  This is the patient's preferred pharmacy:   [facility] [address]    Has the prescription been filled recently?   Is the patient out of the medication?   Has the patient been seen for an appointment in the last year OR does the patient have an upcoming appointment?   Can we respond through MyChart?   Agent: Please be advised that Rx refills may take up to 3 business days. We ask that you follow-up with your pharmacy.

## 2023-01-26 NOTE — Telephone Encounter (Signed)
 Copied from CRM [id number]. Topic: Clinical - Medication Refill >> Jan 26, 2023 10:30 AM [name] wrote: Most Recent Primary Care Visit:  Provider: PURCELL EMIL SCHANZ  Department: [facility]  Visit Type: OFFICE VISIT  Date: 09/20/2022  Medication: methocarbamol  (ROBAXIN ) 500 MG tablet  Has the patient contacted their pharmacy? Yes (Agent: If no, request that the patient contact the pharmacy for the refill. If patient does not wish to contact the pharmacy document the reason why and proceed with request.) (Agent: If yes, when and what did the pharmacy advise?)  Is this the correct pharmacy for this prescription? Yes If no, delete pharmacy and type the correct one.  This is the patient's preferred pharmacy:  [facility] [address], [place], [place] [address]  Has the prescription been filled recently? Yes  Is the patient out of the medication? Yes  Has the patient been seen for an appointment in the last year OR does the patient have an upcoming appointment? Yes  Can we respond through MyChart? Yes  Agent: Please be advised that Rx refills may take up to 3 business days. We ask that you follow-up with your pharmacy.

## 2023-02-01 ENCOUNTER — Telehealth: Payer: Self-pay | Admitting: Emergency Medicine

## 2023-02-01 ENCOUNTER — Other Ambulatory Visit: Payer: Self-pay | Admitting: Emergency Medicine

## 2023-02-01 DIAGNOSIS — M62838 Other muscle spasm: Secondary | ICD-10-CM

## 2023-02-01 NOTE — Telephone Encounter (Signed)
Look into this please

## 2023-02-01 NOTE — Telephone Encounter (Signed)
 Copied from CRM [id number]. Topic: Clinical - Medication Refill >> Feb 01, 2023 10:05 AM Corin V wrote: Most Recent Primary Care Visit:  Provider: PURCELL EMIL SCHANZ  Department: [facility]  Visit Type: OFFICE VISIT  Date: 09/20/2022  Medication: methocarbamol  (ROBAXIN ) 500 MG tablet   Has the patient contacted their pharmacy? Yes (Agent: If no, request that the patient contact the pharmacy for the refill. If patient does not wish to contact the pharmacy document the reason why and proceed with request.) (Agent: If yes, when and what did the pharmacy advise?)  Is this the correct pharmacy for this prescription? Yes If no, delete pharmacy and type the correct one.  This is the patient's preferred pharmacy:  [id number] [place], [place] - [address] AT [facility] [address] [place] [address] Phone: [phone] Fax: [phone]  Has the prescription been filled recently? No  Is the patient out of the medication? No  Has the patient been seen for an appointment in the last year OR does the patient have an upcoming appointment? Yes  Can we respond through MyChart? No  Agent: Please be advised that Rx refills may take up to 3 business days. We ask that you follow-up with your pharmacy.

## 2023-02-01 NOTE — Telephone Encounter (Signed)
 Copied from CRM [id number]. Topic: Clinical - Medication Refill >> Feb 01, 2023 10:13 AM Corin V wrote: Most Recent Primary Care Visit:  Provider: PURCELL EMIL SCHANZ  Department: [facility]  Visit Type: OFFICE VISIT  Date: 09/20/2022  Medication: penicillin DR 500mg   Has the patient contacted their pharmacy? Yes (Agent: If no, request that the patient contact the pharmacy for the refill. If patient does not wish to contact the pharmacy document the reason why and proceed with request.) (Agent: If yes, when and what did the pharmacy advise?)  Is this the correct pharmacy for this prescription? Yes If no, delete pharmacy and type the correct one.  This is the patient's preferred pharmacy:  [id number] [place], [place] - [address] AT [facility] [address] [place] [address] Phone: [phone] Fax: [phone]  Has the prescription been filled recently? No  Is the patient out of the medication? No  Has the patient been seen for an appointment in the last year OR does the patient have an upcoming appointment? Yes  Can we respond through MyChart? No  Agent: Please be advised that Rx refills may take up to 3 business days. We ask that you follow-up with your pharmacy.

## 2023-02-08 DIAGNOSIS — I129 Hypertensive chronic kidney disease with stage 1 through stage 4 chronic kidney disease, or unspecified chronic kidney disease: Secondary | ICD-10-CM | POA: Diagnosis not present

## 2023-02-08 DIAGNOSIS — C3490 Malignant neoplasm of unspecified part of unspecified bronchus or lung: Secondary | ICD-10-CM | POA: Diagnosis not present

## 2023-02-08 DIAGNOSIS — R634 Abnormal weight loss: Secondary | ICD-10-CM | POA: Diagnosis not present

## 2023-02-08 DIAGNOSIS — N184 Chronic kidney disease, stage 4 (severe): Secondary | ICD-10-CM | POA: Diagnosis not present

## 2023-02-09 LAB — LAB REPORT - SCANNED
Albumin, Urine POC: 28.5
Albumin/Creatinine Ratio, Urine, POC: 24
Creatinine, POC: 117.5 mg/dL
EGFR: 26

## 2023-02-16 ENCOUNTER — Telehealth: Payer: Self-pay | Admitting: Internal Medicine

## 2023-02-16 NOTE — Telephone Encounter (Signed)
Scheduled appointment per 1/23 scheduling message. Spoke to the patient on the phone and appointment date and time was confirmed.

## 2023-02-22 ENCOUNTER — Ambulatory Visit
Admission: RE | Admit: 2023-02-22 | Discharge: 2023-02-22 | Disposition: A | Discharge status: Home / Self Care | Payer: 59 | Source: Ambulatory Visit | Attending: Internal Medicine | Admitting: Internal Medicine

## 2023-02-22 DIAGNOSIS — C7931 Secondary malignant neoplasm of brain: Secondary | ICD-10-CM | POA: Diagnosis not present

## 2023-02-22 DIAGNOSIS — C3490 Malignant neoplasm of unspecified part of unspecified bronchus or lung: Secondary | ICD-10-CM | POA: Diagnosis not present

## 2023-02-22 MED ORDER — GADOPICLENOL 0.5 MMOL/ML IV SOLN
6.0000 mL | Freq: Once | INTRAVENOUS | Status: AC | PRN
  Administered 2023-02-22: 6 mL via INTRAVENOUS

## 2023-02-27 ENCOUNTER — Telehealth: Payer: Self-pay

## 2023-02-27 ENCOUNTER — Ambulatory Visit (HOSPITAL_COMMUNITY): Payer: 59

## 2023-02-27 NOTE — Telephone Encounter (Signed)
Called and spoke with daughter. Given lab appt on 2/11 at 2 pm prior to CT scan and given appt with Dr. Bertis Ruddy on 2/20 at 1240. Daughter is aware of appts. Daughter rescheduled CT for today due her Mom having a cold and back pain.

## 2023-03-01 ENCOUNTER — Inpatient Hospital Stay: Payer: 59 | Attending: Internal Medicine | Admitting: Internal Medicine

## 2023-03-01 ENCOUNTER — Telehealth: Payer: Self-pay | Admitting: Internal Medicine

## 2023-03-01 VITALS — BP 126/63 | HR 87 | Temp 97.1°F | Resp 15 | Wt 122.3 lb

## 2023-03-01 DIAGNOSIS — Z803 Family history of malignant neoplasm of breast: Secondary | ICD-10-CM | POA: Insufficient documentation

## 2023-03-01 DIAGNOSIS — C3490 Malignant neoplasm of unspecified part of unspecified bronchus or lung: Secondary | ICD-10-CM | POA: Insufficient documentation

## 2023-03-01 DIAGNOSIS — Z801 Family history of malignant neoplasm of trachea, bronchus and lung: Secondary | ICD-10-CM | POA: Diagnosis not present

## 2023-03-01 DIAGNOSIS — C7931 Secondary malignant neoplasm of brain: Secondary | ICD-10-CM | POA: Insufficient documentation

## 2023-03-01 DIAGNOSIS — Z87891 Personal history of nicotine dependence: Secondary | ICD-10-CM | POA: Insufficient documentation

## 2023-03-01 NOTE — Progress Notes (Signed)
 [facility] at [facility] [address]  [place], [place] [address] [phone]   Interval Evaluation  Date of Service: 03/01/23 Patient Name: [name] Patient MRN: [id number] Patient DOB: 06-15-1935 Provider: Arthea MARLA Manns, MD  Identifying Statement:  [name] is a 88 y.o. female with Malignant neoplasm metastatic to brain [facility])    Primary Cancer:  Oncologic History: Oncology History Overview Note  Non-small cell lung cancer, adenocarcinoma   Primary site: Lung (Left)   Staging method: AJCC 7th Edition   Clinical: Stage IB (T2a, N0, M0) signed by Almarie Bedford, MD on 04/29/2013  9:01 PM   Pathologic: Stage IB (T2a, N0, cM0) signed by Dallas KATHEE Jude, MD on 04/09/2013 12:51 PM   Summary: Stage IB (T2a, N0, cM0) Molecular studies done on sample from 2105 showed no actionable mutations. MSI stable, low tumor mutation burden    History of lung cancer  02/26/2013 Imaging   CXR for evaluation of cough showed new left lung mass   02/28/2013 Imaging   CT chest showed left new lung nodule   03/14/2013 Imaging   PET/CT scan showed localized disease in the left lung   04/07/2013 Surgery   She underwent bronchoscopy and left lower lobe resection and lymph node sampling with negative margins   05/06/2013 Procedure   The patient has placement of Infuse-a-Port   05/21/2013 - 07/30/2013 Chemotherapy   She completed 4 cycles of adjuvant chemotherapy with navelbine  and cisplatin    07/01/2013 Imaging   CT scan of the chest shows stable appearance.   07/02/2013 Adverse Reaction   Dose of chemotherapy was adjusted further due to severe anemia.   12/31/2013 Imaging   CT scan of the chest show no evidence of disease recurrence   07/03/2014 Imaging   Chest x-ray looks normal   12/31/2014 Imaging   Ct chest showed no evidence of recurrence   05/31/2015 Imaging   Status post resection of a left lower lobe lung lesion without findings for recurrent tumor or  metastatic disease.    05/29/2016 Imaging   1. Stable exam. Status post left lower lobectomy without evidence for new or progressive findings. 2. Emphysema. 3. Thoracic aortic atherosclerosis   05/30/2017 Imaging   1. Slight interval increase in size of right upper lobe nodule. Recommend attention on follow-up. 2. Stable appearance of the left hemithorax with left lower lobectomy. 3. Aortic Atherosclerosis (ICD10-I70.0) and Emphysema (ICD10-J43.9).   11/13/2018 Imaging   1. No evidence of recurrent or metastatic disease. 2. Clustered peribronchovascular nodularity in the right upper lobe appears stable. 3. 7 mm left upper lobe ground-glass nodule, stable. Continued attention on follow-up exams is warranted. 4. Aortic atherosclerosis (ICD10-170.0). Coronary artery calcification. 5.  Emphysema (ICD10-J43.9).   05/19/2022 PET scan   NM PET Image Restage (PS) Skull Base to Thigh (F-18 FDG)  Result Date: 05/19/2022 CLINICAL DATA:  Subsequent treatment strategy for metastatic lung cancer. EXAM: NUCLEAR MEDICINE PET SKULL BASE TO THIGH TECHNIQUE: 6.84 mCi F-18 FDG was injected intravenously. Full-ring PET imaging was performed from the skull base to thigh after the radiotracer. CT data was obtained and used for attenuation correction and anatomic localization. Fasting blood glucose: 98 mg/dl COMPARISON:  CT 95/86/7975 FINDINGS: Mediastinal blood pool activity: SUV max 3.67 Liver activity: SUV max NA NECK: Mass within the cerebellum noted on MRI from 05/17/2022 is tracer avid within SUV max of 26.70. Incidental CT findings: No tracer avid cervical lymph nodes. Nodule in the left lobe of  thyroid  gland containing calcifications measures 2.1 cm. No significant increased radiotracer uptake identified within this nodule. This has been evaluated on previous imaging. (ref: J Am Coll Radiol. 2015 Feb;12(2): 143-50). CHEST: No tracer avid mediastinal, hilar or axillary nodes. There are no tracer avid pulmonary  nodules identified. Subsolid nodule in the posterior left lower lobe measures 7 mm without significant tracer uptake, image 38/7. This is technically too small to reliably characterize however low by PET-CT. The irregular nodule within the periphery of the right upper lobe measuring 1 cm is stable from the previous exam without significant tracer uptake, image 20/7. Focal nodular thickening over the posterior lateral left lower lobe measures approximately 1.1 by 0.7 cm without significant tracer uptake, image 79/4. Incidental CT findings: Emphysema.Aortic atherosclerosis coronary artery calcifications. ABDOMEN/PELVIS: No abnormal tracer uptake identified within the liver, pancreas, spleen, or adrenal glands. Incidental CT findings: Aortic atherosclerosis. Small hiatal hernia. 3.7 cm right kidney cyst. No follow-up imaging recommended. SKELETON: No focal hypermetabolic activity to suggest skeletal metastasis. Incidental CT findings: None. IMPRESSION: 1. The mass within the cerebellum noted on MRI from 05/17/2022 is tracer avid with SUV max of 26.70. 2. No signs of tracer avid nodal metastasis or solid organ metastasis within the chest, abdomen or pelvis. 3. Stable small pulmonary nodules are identified without significant tracer uptake. 4. Coronary artery calcifications. 5.  Aortic Atherosclerosis (ICD10-I70.0). Electronically Signed   By: Waddell Calk M.D.   On: 05/19/2022 09:28   MR Brain W Wo Contrast  Result Date: 05/18/2022 CLINICAL DATA:  Brain/CNS neoplasm, staging 3T SRS Protocol for radiation treatment planning. EXAM: MRI HEAD WITHOUT AND WITH CONTRAST TECHNIQUE: Multiplanar, multiecho pulse sequences of the brain and surrounding structures were obtained without and with intravenous contrast. CONTRAST:  6.5 mL Vueway . COMPARISON:  MRI brain 05/06/2022. FINDINGS: Brain: Unchanged 27 x 22 mm enhancing mass with peripheral hemosiderin staining and surrounding vasogenic edema centered within the medial  aspect of the left cerebellar hemisphere (image 44 series 13). No new enhancing lesions. Unchanged mass effect on the inferior fourth ventricle. Stable size and configuration of the third and lateral ventricles. No acute infarct or acute hemorrhage. Unchanged encephalomalacia in the left anterior temporal lobe, likely secondary to prior trauma. Vascular: Normal flow voids and enhancement. Skull and upper cervical spine: Normal marrow signal and enhancement. Sinuses/Orbits: Unremarkable. Other: None. IMPRESSION: 1. Unchanged 27 mm enhancing mass in the left cerebellar hemisphere with surrounding vasogenic edema. No new enhancing lesions. 2. Stable size and configuration of the third and lateral ventricles. Electronically Signed   By: Ryan Chess M.D.   On: 05/18/2022 11:18   MR BRAIN W CONTRAST  Result Date: 05/06/2022 CLINICAL DATA:  Brain metastases, unknown primary EXAM: MRI HEAD WITH CONTRAST TECHNIQUE: Multiplanar, multiecho pulse sequences of the brain and surrounding structures were obtained with intravenous contrast. CONTRAST:  6.2mL GADAVIST  GADOBUTROL  1 MMOL/ML IV SOLN COMPARISON:  Same day MRI head. FINDINGS: Postcontrast only imaging was performed to further evaluate the abnormality seen on same day noncontrast MRI. The mass within the left cerebellum is enhancing, compatible with a metastasis given the clinical history. No other enhancing lesions identified. Please see same day MRI head for further characterization, including effacement of the fourth ventricle in ventriculomegaly. IMPRESSION: Postcontrast only imaging. The mass within the left cerebellum is enhancing, compatible with a metastasis given the clinical history. No other enhancing lesions identified. Electronically Signed   By: Gilmore GORMAN Molt M.D.   On: 05/06/2022 15:17   CT Chest Wo Contrast  Result Date: 05/06/2022 CLINICAL DATA:  History of lung cancer. New hemorrhagic mass in the cerebellum. Restage lung cancer. * Tracking  Code: BO * EXAM: CT CHEST, ABDOMEN AND PELVIS WITHOUT CONTRAST TECHNIQUE: Multidetector CT imaging of the chest, abdomen and pelvis was performed following the standard protocol without IV contrast. RADIATION DOSE REDUCTION: This exam was performed according to the departmental dose-optimization program which includes automated exposure control, adjustment of the mA and/or kV according to patient size and/or use of iterative reconstruction technique. COMPARISON:  CT chest from 11/28/2021 FINDINGS: CT CHEST FINDINGS Cardiovascular: Heart size is normal. Aortic atherosclerosis. Coronary artery calcifications. No pericardial effusion. Mediastinum/Nodes: Asymmetric heterogeneous enlargement of the left lobe of thyroid  gland. This has been evaluated on previous imaging. (ref: J Am Coll Radiol. 2015 Feb;12(2): 143-50). Trachea appears patent and midline. Normal appearance of the esophagus. No mediastinal or axillary adenopathy. Hilar structures are suboptimally evaluated due to lack of IV contrast. Lungs/Pleura: Central airways appear patent. No pleural effusion or airspace consolidation. Mild to moderate emphysema. Status post left lower lobectomy. Scattered lung nodules are identified, including: -within the periphery of the right lower lobe there is an irregular nodule measuring 1 cm, image 63/4. Previously this measured 0.9 cm. -Sub solid nodule in the posterior left lower lung measures 0.8 cm, image 93/4. Formally 0.6 cm. -Subpleural nodular density over the posterolateral left lower lung measures 1.2 x 0.5 cm, image 95/4. Previously this measured the same. Musculoskeletal: Status post ACDF within the cervical spine. No acute or suspicious bone lesions. No chest wall mass. CT ABDOMEN PELVIS FINDINGS Hepatobiliary: There are 2 small low-attenuation foci within the left lobe of liver and posteromedial right lobe of liver measuring up to 8 mm. These are technically too small to reliably characterize but appear  unchanged. Gallbladder is unremarkable. Pancreas: Unremarkable. No pancreatic ductal dilatation or surrounding inflammatory changes. Spleen: Normal in size without focal abnormality. Adrenals/Urinary Tract: Normal appearance of the adrenal glands. No nephrolithiasis or hydronephrosis. Bilateral simple appearing kidney cysts are noted. The largest is in the lateral cortex of the right kidney measuring 3.6 cm, image 65/3. No follow-up imaging recommended. No hydronephrosis identified bilaterally. Urinary bladder appears normal. Stomach/Bowel: Small hiatal hernia. Stomach is nondistended. The appendix is visualized and appears normal. No pathologic dilatation of the large or small bowel loops. Distal colonic diverticula noted without signs of acute diverticulitis. Vascular/Lymphatic: Aortic atherosclerosis. No aneurysm. No signs of abdominopelvic adenopathy. Reproductive: Partially calcified fibroid is noted anteriorly measuring 1.9 cm. No adnexal mass identified. Other: No ascites or focal fluid collections. No signs of peritoneal nodularity or mass. Musculoskeletal: Previous PLIF at L4-5. Degenerative disc disease is noted at the remaining lumbar spine levels. Bilateral facet arthropathy. No acute or suspicious osseous findings. IMPRESSION: 1. Several bilateral lung nodules are again noted. There has been no significant change in the size of these nodules (within 1-2 mm). No new pulmonary lesions identified when compared with previous exam. 2. No evidence for metastatic disease to the abdomen or pelvis. 3. Coronary artery calcifications. 4. Aortic Atherosclerosis (ICD10-I70.0) and Emphysema (ICD10-J43.9). Electronically Signed   By: Waddell Calk M.D.   On: 05/06/2022 13:56   CT ABDOMEN PELVIS WO CONTRAST  Result Date: 05/06/2022 CLINICAL DATA:  History of lung cancer. New hemorrhagic mass in the cerebellum. Restage lung cancer. * Tracking Code: BO * EXAM: CT CHEST, ABDOMEN AND PELVIS WITHOUT CONTRAST TECHNIQUE:  Multidetector CT imaging of the chest, abdomen and pelvis was performed following the standard protocol without IV contrast. RADIATION  DOSE REDUCTION: This exam was performed according to the departmental dose-optimization program which includes automated exposure control, adjustment of the mA and/or kV according to patient size and/or use of iterative reconstruction technique. COMPARISON:  CT chest from 11/28/2021 FINDINGS: CT CHEST FINDINGS Cardiovascular: Heart size is normal. Aortic atherosclerosis. Coronary artery calcifications. No pericardial effusion. Mediastinum/Nodes: Asymmetric heterogeneous enlargement of the left lobe of thyroid  gland. This has been evaluated on previous imaging. (ref: J Am Coll Radiol. 2015 Feb;12(2): 143-50). Trachea appears patent and midline. Normal appearance of the esophagus. No mediastinal or axillary adenopathy. Hilar structures are suboptimally evaluated due to lack of IV contrast. Lungs/Pleura: Central airways appear patent. No pleural effusion or airspace consolidation. Mild to moderate emphysema. Status post left lower lobectomy. Scattered lung nodules are identified, including: -within the periphery of the right lower lobe there is an irregular nodule measuring 1 cm, image 63/4. Previously this measured 0.9 cm. -Sub solid nodule in the posterior left lower lung measures 0.8 cm, image 93/4. Formally 0.6 cm. -Subpleural nodular density over the posterolateral left lower lung measures 1.2 x 0.5 cm, image 95/4. Previously this measured the same. Musculoskeletal: Status post ACDF within the cervical spine. No acute or suspicious bone lesions. No chest wall mass. CT ABDOMEN PELVIS FINDINGS Hepatobiliary: There are 2 small low-attenuation foci within the left lobe of liver and posteromedial right lobe of liver measuring up to 8 mm. These are technically too small to reliably characterize but appear unchanged. Gallbladder is unremarkable. Pancreas: Unremarkable. No pancreatic  ductal dilatation or surrounding inflammatory changes. Spleen: Normal in size without focal abnormality. Adrenals/Urinary Tract: Normal appearance of the adrenal glands. No nephrolithiasis or hydronephrosis. Bilateral simple appearing kidney cysts are noted. The largest is in the lateral cortex of the right kidney measuring 3.6 cm, image 65/3. No follow-up imaging recommended. No hydronephrosis identified bilaterally. Urinary bladder appears normal. Stomach/Bowel: Small hiatal hernia. Stomach is nondistended. The appendix is visualized and appears normal. No pathologic dilatation of the large or small bowel loops. Distal colonic diverticula noted without signs of acute diverticulitis. Vascular/Lymphatic: Aortic atherosclerosis. No aneurysm. No signs of abdominopelvic adenopathy. Reproductive: Partially calcified fibroid is noted anteriorly measuring 1.9 cm. No adnexal mass identified. Other: No ascites or focal fluid collections. No signs of peritoneal nodularity or mass. Musculoskeletal: Previous PLIF at L4-5. Degenerative disc disease is noted at the remaining lumbar spine levels. Bilateral facet arthropathy. No acute or suspicious osseous findings. IMPRESSION: 1. Several bilateral lung nodules are again noted. There has been no significant change in the size of these nodules (within 1-2 mm). No new pulmonary lesions identified when compared with previous exam. 2. No evidence for metastatic disease to the abdomen or pelvis. 3. Coronary artery calcifications. 4. Aortic Atherosclerosis (ICD10-I70.0) and Emphysema (ICD10-J43.9). Electronically Signed   By: Waddell Calk M.D.   On: 05/06/2022 13:56   MR BRAIN WO CONTRAST  Result Date: 05/06/2022 CLINICAL DATA:  Memory impairment.  Recent headaches and imbalance EXAM: MRI HEAD WITHOUT CONTRAST TECHNIQUE: Multiplanar, multiecho pulse sequences of the brain and surrounding structures were obtained without intravenous contrast. COMPARISON:  10/16/2005 FINDINGS:  Brain: Isointense mass with hemorrhagic features (hemosiderin at its inferior periphery) measuring up to 2.7 cm. Moderate vasogenic edema in the mass causes effacement of the lower fourth ventricle with third and lateral ventriculomegaly. Mild periventricular FLAIR hyperintensity at the lateral ventricles which is more likely chronic small vessel ischemia than transependymal flow given incomplete appearance in the degree of ventriculomegaly. Gliosis and volume loss at the anterior  left temporal lobe which was also seen on prior, possibly posttraumatic in this location. Case discussed with the patient's daughter who is driving the patient and Bishop referral has been made. [name] made aware. Vascular: Major flow voids are preserved Skull and upper cervical spine: Normal marrow signal. Sinuses/Orbits: No significant finding. Other: Small proteinaceous cystic intensity anterior to the hyoid, consistent with thyroglossal duct cyst measuring 1 cm. IMPRESSION: Nearly 3 cm hemorrhagic mass with vasogenic edema in the lower cerebellum. There is effacement of the fourth ventricle with ventriculomegaly. Presumed metastasis in this patient with history malignancy, recommend postcontrast assessment. Electronically Signed   By: Dorn Roulette M.D.   On: 05/06/2022 09:48      05/26/2022 - 06/05/2022 Radiation Therapy   First Treatment Date: 2022-05-26 - Last Treatment Date: 2022-06-05   Plan Name: Brain_SRT Site: Brain Technique: SBRT/SRT-IMRT Mode: Photon Dose Per Fraction: 9 Gy Prescribed Dose (Delivered / Prescribed): 27 Gy / 27 Gy Prescribed Fxs (Delivered / Prescribed): 3 / 3   09/28/2022 Imaging   CT CHEST WO CONTRAST  Result Date: 09/27/2022 CLINICAL DATA:  Non-small cell lung cancer; * Tracking Code: BO * EXAM: CT CHEST WITHOUT CONTRAST TECHNIQUE: Multidetector CT imaging of the chest was performed following the standard protocol without IV contrast. RADIATION DOSE REDUCTION: This exam was performed  according to the departmental dose-optimization program which includes automated exposure control, adjustment of the mA and/or kV according to patient size and/or use of iterative reconstruction technique. COMPARISON:  Multiple priors, most recent PET-CT dated May 18, 2022 and chest CT dated April 13th 2024 FINDINGS: Cardiovascular: Normal heart size. No pericardial effusion. Normal caliber thoracic aorta with severe calcified plaque. Severe coronary artery calcifications. Mediastinum/Nodes: Esophagus is unremarkable. Multinodular left thyroid  goiter unchanged when compared with the prior exam. No enlarged lymph nodes seen in the chest. Lungs/Pleura: Central airways are patent. Moderate centrilobular emphysema. Stable postsurgical findings of prior left lower lobectomy. Stable subpleural linear opacities of the lower left lung, likely due to scarring. Scattered bilateral pulmonary nodules are stable. Reference irregular solid nodule of the right upper lobe measuring 10 mm with possible small ground-glass component located on series 6, image 49, unchanged. Stable irregular solid nodule of the left lung located on image 80. Stable ground-glass nodule of the right upper lobe measuring 9 mm on series 6, image 39. No pleural effusion. Upper Abdomen: Partially visualized simple appearing cyst of the right kidney, no specific follow-up imaging is necessary. Musculoskeletal: No chest wall mass or suspicious bone lesions identified. IMPRESSION: 1. Stable postsurgical findings of prior left lower lobectomy. No evidence of recurrent or metastatic disease. 2. Stable bilateral pulmonary nodules. Recommend attention on follow-up. 3. Aortic Atherosclerosis (ICD10-I70.0) and Emphysema (ICD10-J43.9). Electronically Signed   By: Rea Marc M.D.   On: 09/27/2022 18:55       CNS Oncologic History 06/05/22: Completes fractionated SRS to cerebellar metastasis [name])  Interval History: [name] presents today for  follow up.  Doesn't report any neurologic symptoms today.  Remains off the decadron .  Continues on observation for lung cancer with Dr. Lonn.  H+P (05/08/22) Patient presents today to review recent MRI, CNS complaints.  She describes several weeks history of headaches, new from prior.  She attributes them to chemicals sprayed in her house for fleas and bugs, noting that the headache actually resolved once the spray was discontinued last week.  PCP ordered a brain MRI, which demonstrated an enhancing mass in the posterior fossa.  She denies any  issues with balance, gait is independent.  No other systemic complains, hasn't followed up regarding history of lung cancer since 2020.  Medications: Current Outpatient Medications on File Prior to Visit  Medication Sig Dispense Refill   acetaminophen  (TYLENOL ) 500 MG tablet Take 1 tablet (500 mg total) by mouth every 6 (six) hours as needed. 30 tablet 0   atorvastatin  (LIPITOR) 10 MG tablet TAKE 1 TABLET (10 MG TOTAL) BY MOUTH DAILY AT 6 PM. 90 tablet 3   chlorthalidone  (HYGROTON ) 25 MG tablet TAKE 1 TABLET BY MOUTH EVERY DAY 90 tablet 3   Cholecalciferol  (VITAMIN D -3) 1000 units CAPS Take 1 capsule (1,000 Units total) by mouth daily. 90 capsule 1   clopidogrel  (PLAVIX ) 75 MG tablet TAKE 1 TABLET BY MOUTH EVERY DAY WITH BREAKFAST 90 tablet 2   gabapentin  (NEURONTIN ) 300 MG capsule Take 300 mg by mouth daily.     LORazepam  (ATIVAN ) 0.5 MG tablet Take 1 tablet (0.5 mg total) by mouth 2 (two) times daily as needed for anxiety. 4 tablet 0   magnesium  oxide (MAG-OX) 400 MG tablet Take 1 tablet (400 mg total) by mouth daily. 90 tablet 1   methocarbamol  (ROBAXIN ) 500 MG tablet Take 1 tablet (500 mg total) by mouth as needed for muscle spasms. 30 tablet 2   metoprolol  succinate (TOPROL -XL) 25 MG 24 hr tablet TAKE 1 TABLET (25 MG) BY ORAL ROUTE TWICE A DAY 180 tablet 3   Multiple Vitamin (MULTIVITAMIN) tablet Take 1 tablet by mouth at bedtime.      telmisartan   (MICARDIS ) 40 MG tablet TAKE 1 TABLET BY MOUTH EVERY DAY 90 tablet 3   No current facility-administered medications on file prior to visit.    Allergies:  Allergies  Allergen Reactions   Aspirin Other (See Comments)    REACTION: stomach upset   Lactose Intolerance (Gi) Diarrhea and Other (See Comments)    can't drink milk. It tears my stomach up.   Adhesive [Tape] Rash   Lisinopril Other (See Comments)    cough   Percocet [Oxycodone -Acetaminophen ] Itching   Past Medical History:  Past Medical History:  Diagnosis Date   Arthritis    Benign hypertensive kidney disease with chronic kidney disease stage I through stage IV, or unspecified(403.10)    sees dr. douglass again in april   Cancer [facility])    lung ca dx'd 02/2013   Chronic kidney disease, stage III (moderate) (HCC)    Cough 02/26/2013   GERD (gastroesophageal reflux disease)    Headache    Hypertension    Hyperthyroidism    took iodine treatment for this   Lung mass 02/27/2013   Neurologic disorder    2009   Shortness of breath    exertion   Thrombocytopenia, unspecified (HCC) 02/26/2013   Unspecified vitamin D  deficiency    Past Surgical History:  Past Surgical History:  Procedure Laterality Date   BACK SURGERY     EYE SURGERY     cataract   EYE SURGERY Left    open up tear duct   LYMPH NODE DISSECTION Left 04/07/2013   Procedure: LYMPH NODE DISSECTION;  Surgeon: Dallas KATHEE Jude, MD;  Location: [facility] OR;  Service: Thoracic;  Laterality: Left;   NECK SURGERY     PORT-A-CATH REMOVAL Left 01/09/2014   Procedure: REMOVAL PORT-A-CATH;  Surgeon: Dallas KATHEE Jude, MD;  Location: [facility] OR;  Service: Thoracic;  Laterality: Left;   PORTACATH PLACEMENT Left 05/06/2013   Procedure: INSERTION PORT-A-CATH;  Surgeon: Dallas KATHEE Jude, MD;  Location: [facility] OR;  Service: Thoracic;  Laterality: Left;   VIDEO ASSISTED THORACOSCOPY (VATS)/ LOBECTOMY Left 04/07/2013   Procedure: VIDEO ASSISTED THORACOSCOPY (VATS)/ LOBECTOMY; INSERTION OF ON-Q  PUMP;  Surgeon: Dallas KATHEE Jude, MD;  Location: [facility] OR;  Service: Thoracic;  Laterality: Left;   VIDEO BRONCHOSCOPY N/A 04/07/2013   Procedure: VIDEO BRONCHOSCOPY;  Surgeon: Dallas KATHEE Jude, MD;  Location: [facility] OR;  Service: Thoracic;  Laterality: N/A;   Social History:  Social History   Socioeconomic History   Marital status: Single    Spouse name: Not on file   Number of children: 2   Years of education: 4   Highest education level: Not on file  Occupational History   Not on file  Tobacco Use   Smoking status: Former    Current packs/day: 0.00    Average packs/day: 0.5 packs/day for 60.0 years (30.0 ttl pk-yrs)    Types: Cigarettes    Start date: 02/25/1950    Quit date: 02/25/2012    Years since quitting: 11.0   Smokeless tobacco: Never  Vaping Use   Vaping status: Never Used  Substance and Sexual Activity   Alcohol use: No   Drug use: No   Sexual activity: Not on file  Other Topics Concern   Not on file  Social History Narrative   Right handed   Drinks caffeine   One story home   Lives alone   retired   Social Drivers of Corporate Investment Banker Strain: Low Risk  (11/11/2020)   Overall Financial Resource Strain (CARDIA)    Difficulty of Paying Living Expenses: Not hard at all  Food Insecurity: No Food Insecurity (05/19/2022)   Hunger Vital Sign    Worried About Running Out of Food in the Last Year: Never true    Ran Out of Food in the Last Year: Never true  Transportation Needs: No Transportation Needs (05/08/2022)   PRAPARE - Administrator, Civil Service (Medical): No    Lack of Transportation (Non-Medical): No  Physical Activity: Sufficiently Active (11/11/2020)   Exercise Vital Sign    Days of Exercise per Week: 5 days    Minutes of Exercise per Session: 30 min  Stress: No Stress Concern Present (11/11/2020)   [employer] of Occupational Health - Occupational Stress Questionnaire    Feeling of Stress : Not at all  Social Connections:  Moderately Integrated (11/11/2020)   Social Connection and Isolation Panel [NHANES]    Frequency of Communication with Friends and Family: More than three times a week    Frequency of Social Gatherings with Friends and Family: More than three times a week    Attends Religious Services: More than 4 times per year    Active Member of [employer] or Organizations: Yes    Attends Banker Meetings: More than 4 times per year    Marital Status: Never married  Intimate Partner Violence: Not At Risk (05/19/2022)   Humiliation, Afraid, Rape, and Kick questionnaire    Fear of Current or Ex-Partner: No    Emotionally Abused: No    Physically Abused: No    Sexually Abused: No   Family History:  Family History  Problem Relation Age of Onset   Breast cancer Mother    Lung cancer Father        smoked   Lung cancer Brother        smoked   Rectal cancer Neg Hx    Stomach cancer Neg Hx  Esophageal cancer Neg Hx    Colon cancer Neg Hx     Review of Systems: Constitutional: Doesn't report fevers, chills or abnormal weight loss Eyes: Doesn't report blurriness of vision Ears, nose, mouth, throat, and face: Doesn't report sore throat Respiratory: Doesn't report cough, dyspnea or wheezes Cardiovascular: Doesn't report palpitation, chest discomfort  Gastrointestinal:  Doesn't report nausea, constipation, diarrhea GU: Doesn't report incontinence Skin: Doesn't report skin rashes Neurological: Per HPI Musculoskeletal: Doesn't report joint pain Behavioral/Psych: Doesn't report anxiety  Physical Exam: There were no vitals filed for this visit.   KPS: 80. General: Alert, cooperative, pleasant, in no acute distress Head: Normal EENT: No conjunctival injection or scleral icterus.  Lungs: Resp effort normal Cardiac: Regular rate Abdomen: Non-distended abdomen Skin: No rashes cyanosis or petechiae. Extremities: No clubbing or edema  Neurologic Exam: Mental Status: Awake, alert,  attentive to examiner. Oriented to self and environment. Language is fluent with intact comprehension.  Cranial Nerves: Visual acuity is grossly normal. Visual fields are full. Extra-ocular movements intact. No ptosis. Face is symmetric Motor: Tone and bulk are normal. Power is full in both arms and legs. Reflexes are symmetric, no pathologic reflexes present.  Sensory: Intact to light touch Gait: Normal.   Labs: I have reviewed the data as listed    Component Value Date/Time   NA 140 05/06/2022 1126   NA 144 07/30/2018 1217   NA 144 05/29/2016 1119   K 3.9 05/06/2022 1126   K 4.4 05/29/2016 1119   CL 106 05/06/2022 1126   CO2 21 (L) 05/06/2022 1126   CO2 23 05/29/2016 1119   GLUCOSE 88 05/06/2022 1126   GLUCOSE 91 05/29/2016 1119   BUN 24 (H) 05/06/2022 1126   BUN 21 07/30/2018 1217   BUN 20.8 05/29/2016 1119   CREATININE 2.00 (H) 05/06/2022 1126   CREATININE 1.7 (H) 05/29/2016 1119   CALCIUM  9.7 05/06/2022 1126   CALCIUM  9.5 05/29/2016 1119   PROT 7.3 05/23/2021 1336   PROT 7.1 07/30/2018 1217   PROT 6.8 05/29/2016 1119   ALBUMIN 4.2 05/23/2021 1336   ALBUMIN 4.6 07/30/2018 1217   ALBUMIN 3.7 05/29/2016 1119   AST 24 05/23/2021 1336   AST 28 05/29/2016 1119   ALT 17 05/23/2021 1336   ALT 29 05/29/2016 1119   ALKPHOS 64 05/23/2021 1336   ALKPHOS 68 05/29/2016 1119   BILITOT 0.5 05/23/2021 1336   BILITOT 0.3 07/30/2018 1217   BILITOT 0.24 05/29/2016 1119   GFRNONAA 24 (L) 05/06/2022 1126   GFRAA 32 (L) 11/04/2018 1120   Lab Results  Component Value Date   WBC 7.5 05/06/2022   NEUTROABS 5.3 05/06/2022   HGB 11.7 (L) 05/06/2022   HCT 36.4 05/06/2022   MCV 95.0 05/06/2022   PLT 250 05/06/2022    Imaging:  MR BRAIN W WO CONTRAST Result Date: 02/22/2023 CLINICAL DATA:  Metastatic lung cancer, assess treatment response EXAM: MRI HEAD WITHOUT AND WITH CONTRAST TECHNIQUE: Multiplanar, multiecho pulse sequences of the brain and surrounding structures were obtained  without and with intravenous contrast. CONTRAST:  6 mL Vueway  COMPARISON:  10/24/2022 MRI FINDINGS: Brain: Further interval decrease in the size of the left cerebellar lesion, which now measures 3 x 7 x 6 mm (AP x TR x CC) (series 15, image 36 and series 18, image 22), previously 10 x 7 x 10 mm when remeasured similarly. Surrounding T2 hyperintense signal is no longer seen. Redemonstrated susceptibility associated with the lesion, consistent with blood products. No associated diffusion restriction  or mass effect. No new abnormal parenchymal or meningeal enhancement. No restricted diffusion to suggest acute or subacute infarct. No acute hemorrhage, mass, mass effect, or midline shift. No hydrocephalus or extra-axial collection. Redemonstrated encephalomalacia in left anterior temporal lobe. Vascular: Normal arterial flow voids. Normal arterial and venous enhancement. Skull and upper cervical spine: Normal marrow signal. Sinuses/Orbits: Mucosal thickening in the ethmoid air cells. Status post bilateral lens replacements. Other: The mastoid air cells are well aerated. IMPRESSION: Further interval decrease in the size of the left cerebellar lesion, with resolution of surrounding T2 hyperintense signal. No new lesions. Electronically Signed   By: Donald Campion M.D.   On: 02/22/2023 17:51      Assessment/Plan Malignant neoplasm metastatic to brain (HCC)  Gianella H Sibert is clinically stable today, now s/p 9 months SRS to posterior fossa metastasis.  No new or progressive changes.  MRI demonstrates stable findings, ongoing reduction in enhancement and surrounding edema.    We appreciate the opportunity to participate in the care of MADIGAN ROSENSTEEL.  We ask that BREXLEY CUTSHAW return to clinic in 4 months following next brain MRI, or sooner as needed.  All questions were answered. The patient knows to call the clinic with any problems, questions or concerns. No barriers to learning were detected.  The total  time spent in the encounter was 30 minutes and more than 50% was on counseling and review of test results   Arthea MARLA Manns, MD Medical Director of Neuro-Oncology [facility] [place] 03/01/23 10:39 AM

## 2023-03-01 NOTE — Telephone Encounter (Signed)
 [name]

## 2023-03-06 ENCOUNTER — Other Ambulatory Visit: Payer: 59

## 2023-03-06 ENCOUNTER — Ambulatory Visit (HOSPITAL_COMMUNITY): Payer: 59

## 2023-03-12 ENCOUNTER — Telehealth: Payer: Self-pay

## 2023-03-12 NOTE — Telephone Encounter (Signed)
Called daughter and told her appt with Dr. Bertis Ruddy on 2/20 canceled due not having CT last week. Vanilla was sick is why she canceled. Alvino Chapel will call to reschedule CT and then call the office back.

## 2023-03-15 ENCOUNTER — Ambulatory Visit: Payer: 59 | Admitting: Hematology and Oncology

## 2023-03-27 ENCOUNTER — Ambulatory Visit: Payer: 59 | Admitting: Internal Medicine

## 2023-04-10 NOTE — Progress Notes (Signed)
 This encounter was created in error - please disregard.   Called patient and she asked if she could reschedule the appt due to her daughter not being there right now.  Patient stated that her daughter may have forgotten about this appt and that she would get her daughter to call office to reschedule.  Her daughter is currently at a doctor's appointment now.

## 2023-04-13 ENCOUNTER — Other Ambulatory Visit: Payer: Self-pay

## 2023-04-13 ENCOUNTER — Inpatient Hospital Stay: Attending: Internal Medicine | Admitting: Licensed Clinical Social Worker

## 2023-04-13 ENCOUNTER — Telehealth: Payer: Self-pay

## 2023-04-13 DIAGNOSIS — C7931 Secondary malignant neoplasm of brain: Secondary | ICD-10-CM

## 2023-04-13 DIAGNOSIS — Z85118 Personal history of other malignant neoplasm of bronchus and lung: Secondary | ICD-10-CM

## 2023-04-13 NOTE — Telephone Encounter (Signed)
 Called [name] back and given below message. She verbalized understanding. Given radiology scheduling # and she will call to schedule. She will call the office back when appt scheduled.

## 2023-04-13 NOTE — Progress Notes (Signed)
 [facility] Clinical Social Work  Clinical Social Work was referred by nurse for assessment of psychosocial needs.  Clinical Social Worker contacted caregiver by phone (daughterAlvino Chapel) to offer support and assess for needs.  Alvino Chapel is continuing to care for her mom (the pt) as well as her own health needs. Alvino Chapel reports that the primary issue right now is with the pharmacy which she is working on changing to a new pharmacy to resolve.  CSW discussed that there are other supports and resources available, such as referral for personal care services for pt if needed. Alvino Chapel declined at this time but voiced thanks and appreciation and stated she will reach out if needs arise.     Robecca Fulgham E Jhovani Griswold, LCSW  Clinical Social Worker [facility]

## 2023-04-13 NOTE — Telephone Encounter (Signed)
 Called back and reviewed appts. Daughter is aware of appts, lab at 1 pm on 4/1 prior to CT and see Dr. Bertis Ruddy at [phone] on 4/8. She will call the office back for questions.

## 2023-04-13 NOTE — Telephone Encounter (Signed)
 Attempted to call daughter, received a message call unable to be completed.  Called Lawrencia and told her appts on 3/27 canceled with Dr. Bertis Ruddy. She needs to have the CT scan with labs and then 1 week later see Dr. Bertis Ruddy. Laylia verbalized understanding. She ask that I try calling back after 2 pm today. Alvino Chapel is at dialysis now.  Will call back later today and try to speak with daughter.

## 2023-04-19 ENCOUNTER — Ambulatory Visit: Payer: 59 | Admitting: Hematology and Oncology

## 2023-04-19 ENCOUNTER — Other Ambulatory Visit: Payer: 59

## 2023-04-24 ENCOUNTER — Inpatient Hospital Stay: Attending: Internal Medicine

## 2023-04-24 ENCOUNTER — Ambulatory Visit (HOSPITAL_COMMUNITY)
Admission: RE | Admit: 2023-04-24 | Discharge: 2023-04-24 | Disposition: A | Discharge status: Home / Self Care | Source: Ambulatory Visit | Attending: Hematology and Oncology | Admitting: Hematology and Oncology

## 2023-04-24 DIAGNOSIS — Z87891 Personal history of nicotine dependence: Secondary | ICD-10-CM | POA: Insufficient documentation

## 2023-04-24 DIAGNOSIS — Z85118 Personal history of other malignant neoplasm of bronchus and lung: Secondary | ICD-10-CM | POA: Insufficient documentation

## 2023-04-24 DIAGNOSIS — I10 Essential (primary) hypertension: Secondary | ICD-10-CM | POA: Insufficient documentation

## 2023-04-24 DIAGNOSIS — C801 Malignant (primary) neoplasm, unspecified: Secondary | ICD-10-CM | POA: Diagnosis not present

## 2023-04-24 DIAGNOSIS — J432 Centrilobular emphysema: Secondary | ICD-10-CM | POA: Diagnosis not present

## 2023-04-24 DIAGNOSIS — C3490 Malignant neoplasm of unspecified part of unspecified bronchus or lung: Secondary | ICD-10-CM | POA: Diagnosis not present

## 2023-04-24 LAB — CBC WITH DIFFERENTIAL (CANCER CENTER ONLY)
Abs Immature Granulocytes: 0.01 10*3/uL (ref 0.00–0.07)
Basophils Absolute: 0.1 10*3/uL (ref 0.0–0.1)
Basophils Relative: 1 %
Eosinophils Absolute: 0.6 10*3/uL — ABNORMAL HIGH (ref 0.0–0.5)
Eosinophils Relative: 10 %
HCT: 35.1 % — ABNORMAL LOW (ref 36.0–46.0)
Hemoglobin: 11.7 g/dL — ABNORMAL LOW (ref 12.0–15.0)
Immature Granulocytes: 0 %
Lymphocytes Relative: 25 %
Lymphs Abs: 1.4 10*3/uL (ref 0.7–4.0)
MCH: 31.2 pg (ref 26.0–34.0)
MCHC: 33.3 g/dL (ref 30.0–36.0)
MCV: 93.6 fL (ref 80.0–100.0)
Monocytes Absolute: 0.7 10*3/uL (ref 0.1–1.0)
Monocytes Relative: 13 %
Neutro Abs: 2.8 10*3/uL (ref 1.7–7.7)
Neutrophils Relative %: 51 %
Platelet Count: 247 10*3/uL (ref 150–400)
RBC: 3.75 MIL/uL — ABNORMAL LOW (ref 3.87–5.11)
RDW: 14.7 % (ref 11.5–15.5)
WBC Count: 5.5 10*3/uL (ref 4.0–10.5)
nRBC: 0 % (ref 0.0–0.2)

## 2023-04-24 LAB — CMP (CANCER CENTER ONLY)
ALT: 17 U/L (ref 0–44)
AST: 26 U/L (ref 15–41)
Albumin: 4.1 g/dL (ref 3.5–5.0)
Alkaline Phosphatase: 59 U/L (ref 38–126)
Anion gap: 6 (ref 5–15)
BUN: 28 mg/dL — ABNORMAL HIGH (ref 8–23)
CO2: 27 mmol/L (ref 22–32)
Calcium: 9.7 mg/dL (ref 8.9–10.3)
Chloride: 110 mmol/L (ref 98–111)
Creatinine: 1.94 mg/dL — ABNORMAL HIGH (ref 0.44–1.00)
GFR, Estimated: 25 mL/min — ABNORMAL LOW (ref >60)
Glucose, Bld: 83 mg/dL (ref 70–99)
Potassium: 4.2 mmol/L (ref 3.5–5.1)
Sodium: 143 mmol/L (ref 135–145)
Total Bilirubin: 0.4 mg/dL (ref 0.0–1.2)
Total Protein: 6.7 g/dL (ref 6.5–8.1)

## 2023-04-30 NOTE — Assessment & Plan Note (Signed)
 The patient was originally diagnosed with lung cancer after presentation with abnormal cough on background history of smoking, status post surgery, stage Ib Pathology: Adenocarcinoma, Molecular studies done on sample from 2105 showed no actionable mutations. MSI stable, low tumor mutation burden  She completed adjuvant chemotherapy with cisplatin and Elson Areas being in 2015  She was found to have recurrent stage IV metastatic cancer to the brain in April 2024 with no signs of systemic disease and completed palliative radiation therapy in May 2024  I reviewed imaging study from April 2025 with the patient which showed no evidence of disease recurrence in her chest MRI of the brain from January 2025 show positive response to treatment with no signs of recurrence We discussed future follow-up I plan to repeat imaging study again in 6 months, due in October 2025

## 2023-05-01 ENCOUNTER — Inpatient Hospital Stay (HOSPITAL_BASED_OUTPATIENT_CLINIC_OR_DEPARTMENT_OTHER): Admitting: Hematology and Oncology

## 2023-05-01 ENCOUNTER — Other Ambulatory Visit: Payer: Self-pay | Admitting: Emergency Medicine

## 2023-05-01 ENCOUNTER — Encounter: Payer: Self-pay | Admitting: Hematology and Oncology

## 2023-05-01 VITALS — BP 167/74 | HR 69 | Resp 18 | Ht 60.0 in | Wt 131.4 lb

## 2023-05-01 DIAGNOSIS — I129 Hypertensive chronic kidney disease with stage 1 through stage 4 chronic kidney disease, or unspecified chronic kidney disease: Secondary | ICD-10-CM | POA: Diagnosis not present

## 2023-05-01 DIAGNOSIS — Z85118 Personal history of other malignant neoplasm of bronchus and lung: Secondary | ICD-10-CM | POA: Diagnosis not present

## 2023-05-01 DIAGNOSIS — Z8673 Personal history of transient ischemic attack (TIA), and cerebral infarction without residual deficits: Secondary | ICD-10-CM

## 2023-05-01 DIAGNOSIS — Z8639 Personal history of other endocrine, nutritional and metabolic disease: Secondary | ICD-10-CM

## 2023-05-01 DIAGNOSIS — N184 Chronic kidney disease, stage 4 (severe): Secondary | ICD-10-CM

## 2023-05-01 DIAGNOSIS — I10 Essential (primary) hypertension: Secondary | ICD-10-CM | POA: Diagnosis not present

## 2023-05-01 DIAGNOSIS — Z87891 Personal history of nicotine dependence: Secondary | ICD-10-CM | POA: Diagnosis not present

## 2023-05-01 DIAGNOSIS — M62838 Other muscle spasm: Secondary | ICD-10-CM

## 2023-05-01 MED ORDER — MAGNESIUM OXIDE 400 MG PO TABS: 400.0000 mg | ORAL_TABLET | Freq: Every day | ORAL | 1 refills | Status: DC

## 2023-05-01 MED ORDER — CLOPIDOGREL BISULFATE 75 MG PO TABS: 75.0000 mg | ORAL_TABLET | Freq: Every day | ORAL | 2 refills | Status: DC

## 2023-05-01 MED ORDER — ATORVASTATIN CALCIUM 10 MG PO TABS: 10.0000 mg | ORAL_TABLET | Freq: Every day | ORAL | 3 refills | Status: DC

## 2023-05-01 MED ORDER — CHLORTHALIDONE 25 MG PO TABS: 25.0000 mg | ORAL_TABLET | Freq: Every day | ORAL | 0 refills | Status: AC

## 2023-05-01 MED ORDER — GABAPENTIN 300 MG PO CAPS: 300.0000 mg | ORAL_CAPSULE | Freq: Every day | ORAL | 3 refills | Status: AC

## 2023-05-01 MED ORDER — ONE-DAILY MULTI VITAMINS PO TABS: 1.0000 | ORAL_TABLET | Freq: Every day | ORAL | 3 refills | Status: DC

## 2023-05-01 MED ORDER — TELMISARTAN 40 MG PO TABS: 40.0000 mg | ORAL_TABLET | Freq: Every day | ORAL | 0 refills | Status: DC

## 2023-05-01 MED ORDER — ACETAMINOPHEN 500 MG PO TABS: 500.0000 mg | ORAL_TABLET | Freq: Four times a day (QID) | ORAL | 0 refills | Status: DC | PRN

## 2023-05-01 MED ORDER — VITAMIN D-3 25 MCG (1000 UT) PO CAPS: 1000.0000 [IU] | ORAL_CAPSULE | Freq: Every day | ORAL | 1 refills | Status: DC

## 2023-05-01 MED ORDER — METHOCARBAMOL 500 MG PO TABS: 500.0000 mg | ORAL_TABLET | ORAL | 2 refills | Status: DC | PRN

## 2023-05-01 MED ORDER — LORAZEPAM 0.5 MG PO TABS: 0.5000 mg | ORAL_TABLET | Freq: Two times a day (BID) | ORAL | 0 refills | Status: DC | PRN

## 2023-05-01 MED ORDER — METOPROLOL SUCCINATE ER 25 MG PO TB24: ORAL_TABLET | ORAL | 0 refills | Status: DC

## 2023-05-01 NOTE — Assessment & Plan Note (Addendum)
 She has poorly controlled hypertension According to the patient, she ran out of her medications recently  I refilled all her antihypertensives today and recommend the patient to follow-up with primary care doctor

## 2023-05-01 NOTE — Progress Notes (Signed)
 [facility] OFFICE PROGRESS NOTE  Patient Care Team: Georgina Quint, MD as PCP - General (Internal Medicine) Artis Delay, MD as Consulting Physician (Hematology and Oncology) Delight Ovens, MD (Inactive) as Consulting Physician (Cardiothoracic Surgery) Gelene Mink, OD as Referring Physician (Optometry) Elwyn Reach (Neurology)  Assessment & Plan History of lung cancer The patient was originally diagnosed with lung cancer after presentation with abnormal cough on background history of smoking, status post surgery, stage Ib Pathology: Adenocarcinoma, Molecular studies done on sample from 2105 showed no actionable mutations. MSI stable, low tumor mutation burden  She completed adjuvant chemotherapy with cisplatin and Elson Areas being in 2015  She was found to have recurrent stage IV metastatic cancer to the brain in April 2024 with no signs of systemic disease and completed palliative radiation therapy in May 2024  I reviewed imaging study from April 2025 with the patient which showed no evidence of disease recurrence in her chest MRI of the brain from January 2025 show positive response to treatment with no signs of recurrence We discussed future follow-up I plan to repeat imaging study again in 6 months, due in October 2025  Benign hypertension with CKD (chronic kidney disease) stage IV (HCC) She has chronic kidney disease since she has last received treatment She will continue close monitoring and risk factor modifications Essential hypertension, benign She has poorly controlled hypertension According to the patient, she ran out of her medications recently  I refilled all her antihypertensives today and recommend the patient to follow-up with primary care doctor  Orders Placed This Encounter  Procedures   CT CHEST WO CONTRAST    Standing Status:   Future    Expected Date:   10/31/2023    Expiration Date:   04/30/2024    Preferred imaging location?:    [facility]    Radiology Contrast Protocol - do NOT remove file path:   [email or web address]   CBC with Differential (Cancer Center Only)    Standing Status:   Future    Expiration Date:   04/30/2024   CMP (Cancer Center only)    Standing Status:   Future    Expiration Date:   04/30/2024     Artis Delay, MD  INTERVAL HISTORY: she returns for surveillance follow-up She denies new symptoms Review blood work as well as CT imaging with the patient which showed no evidence of disease I am concerned about her uncontrolled hypertension and poor renal function  PHYSICAL EXAMINATION: ECOG PERFORMANCE STATUS: 0 - Asymptomatic  Vitals:   05/01/23 0928  BP: (!) 167/74  Pulse: 69  Resp: 18  SpO2: 100%   Filed Weights   05/01/23 0928  Weight: 131 lb 6.4 oz (59.6 kg)    Relevant data reviewed during this visit included CBC, CMP and CT imaging of the lung Apr 2025

## 2023-05-01 NOTE — Telephone Encounter (Signed)
 Copied from CRM [id number]. Topic: Clinical - Medication Refill >> May 01, 2023 10:23 AM Sonny Dandy B wrote: Most Recent Primary Care Visit:  Provider: Georgina Quint  Department: [facility]  Visit Type: OFFICE VISIT  Date: 09/20/2022  Medication: acetaminophen (TYLENOL) 500 MG tablet , atorvastatin (LIPITOR) 10 MG tablet, Cholecalciferol (VITAMIN D-3) 1000 units CAPS,clopidogrel (PLAVIX) 75 MG tablet,gabapentin (NEURONTIN) 300 MG capsule , LORazepam (ATIVAN) 0.5 MG tablet, magnesium oxide (MAG-OX) 400 MG tablet , methocarbamol (ROBAXIN) 500 MG tablet, Multiple Vitamin (MULTIVITAMIN) tablet  Has the patient contacted their pharmacy? Yes (Agent: If no, request that the patient contact the pharmacy for the refill. If patient does not wish to contact the pharmacy document the reason why and proceed with request.) (Agent: If yes, when and what did the pharmacy advise?)  Is this the correct pharmacy for this prescription? Yes If no, delete pharmacy and type the correct one.  This is the patient's preferred pharmacy:  [facility] [phone] - [place], [place] - [address] AT [facility] [address] [place] [address] Phone: [phone] Fax: [phone]   Has the prescription been filled recently? Yes  Is the patient out of the medication? Yes  Has the patient been seen for an appointment in the last year OR does the patient have an upcoming appointment? Yes  Can we respond through MyChart? Yes  Agent: Please be advised that Rx refills may take up to 3 business days. We ask that you follow-up with your pharmacy.

## 2023-05-01 NOTE — Assessment & Plan Note (Addendum)
She has chronic kidney disease since she has last received treatment She will continue close monitoring and risk factor modifications

## 2023-05-15 ENCOUNTER — Encounter: Payer: Self-pay | Admitting: Emergency Medicine

## 2023-05-15 ENCOUNTER — Ambulatory Visit (INDEPENDENT_AMBULATORY_CARE_PROVIDER_SITE_OTHER): Admitting: Emergency Medicine

## 2023-05-15 VITALS — BP 122/70 | HR 56 | Temp 98.8°F | Ht 60.0 in | Wt 135.0 lb

## 2023-05-15 DIAGNOSIS — D1721 Benign lipomatous neoplasm of skin and subcutaneous tissue of right arm: Secondary | ICD-10-CM | POA: Insufficient documentation

## 2023-05-15 DIAGNOSIS — I700 Atherosclerosis of aorta: Secondary | ICD-10-CM

## 2023-05-15 DIAGNOSIS — J439 Emphysema, unspecified: Secondary | ICD-10-CM

## 2023-05-15 DIAGNOSIS — I2510 Atherosclerotic heart disease of native coronary artery without angina pectoris: Secondary | ICD-10-CM

## 2023-05-15 DIAGNOSIS — C7931 Secondary malignant neoplasm of brain: Secondary | ICD-10-CM

## 2023-05-15 DIAGNOSIS — Z85118 Personal history of other malignant neoplasm of bronchus and lung: Secondary | ICD-10-CM

## 2023-05-15 DIAGNOSIS — I10 Essential (primary) hypertension: Secondary | ICD-10-CM

## 2023-05-15 NOTE — Progress Notes (Signed)
 [name] 88 y.o.   Chief Complaint  Patient presents with   Cyst    Patient states having a knot on her right upper arm that has been there for about a month. No pain, she did ask her oncologist about it but they told her she needed to f/u with her pcp.     HISTORY OF PRESENT ILLNESS: This is a 88 y.o. female complaining of nonpainful lump to right upper arm for about a month Also here for follow-up of chronic medical conditions Overall doing well.  Has no other complaints or medical concerns today.  HPI   Prior to Admission medications   Medication Sig Start Date End Date Taking? Authorizing Provider  acetaminophen  (TYLENOL ) 500 MG tablet Take 1 tablet (500 mg total) by mouth every 6 (six) hours as needed. 05/01/23  Yes Torri Michalski, Isidro Margo, MD  atorvastatin  (LIPITOR) 10 MG tablet Take 1 tablet (10 mg total) by mouth daily at 6 PM. 05/01/23  Yes Nikkolas Coomes, Isidro Margo, MD  chlorthalidone  (HYGROTON ) 25 MG tablet Take 1 tablet (25 mg total) by mouth daily. 05/01/23  Yes Gorsuch, Ni, MD  Cholecalciferol  (VITAMIN D -3) 25 MCG (1000 UT) CAPS Take 1 capsule (1,000 Units total) by mouth daily. 05/01/23  Yes Rashied Corallo, Isidro Margo, MD  clopidogrel  (PLAVIX ) 75 MG tablet Take 1 tablet (75 mg total) by mouth daily. 05/01/23  Yes Brandyn Thien, Isidro Margo, MD  gabapentin  (NEURONTIN ) 300 MG capsule Take 1 capsule (300 mg total) by mouth at bedtime. 05/01/23  Yes Adison Reifsteck, Isidro Margo, MD  LORazepam  (ATIVAN ) 0.5 MG tablet Take 1 tablet (0.5 mg total) by mouth 2 (two) times daily as needed for anxiety. 05/01/23  Yes Toba Claudio, Isidro Margo, MD  magnesium  oxide (MAG-OX) 400 MG tablet Take 1 tablet (400 mg total) by mouth daily. 05/01/23  Yes Ferry Matthis, Isidro Margo, MD  methocarbamol  (ROBAXIN ) 500 MG tablet Take 1 tablet (500 mg total) by mouth as needed for muscle spasms. 05/01/23  Yes Suzanne Kho, Isidro Margo, MD  metoprolol  succinate (TOPROL -XL) 25 MG 24 hr tablet Take 1 tablet (25 mg) by oral route twice a day 05/01/23  Yes  Gorsuch, Ni, MD  Multiple Vitamin (MULTIVITAMIN) tablet Take 1 tablet by mouth at bedtime. 05/01/23  Yes Kristoff Coonradt, Isidro Margo, MD  telmisartan  (MICARDIS ) 40 MG tablet Take 1 tablet (40 mg total) by mouth daily. 05/01/23  Yes Joven Mom, Isidro Margo, MD    Allergies  Allergen Reactions   Aspirin Other (See Comments)    REACTION: stomach upset   Lactose Intolerance (Gi) Diarrhea and Other (See Comments)    "can't drink milk. It tears my stomach up."   Adhesive [Tape] Rash   Lisinopril Other (See Comments)    cough   Percocet [Oxycodone -Acetaminophen ] Itching    Patient Active Problem List   Diagnosis Date Noted   Lumbosacral pain 09/20/2022   Malignant neoplasm metastatic to brain [facility]) 05/08/2022   Memory impairment 04/12/2022   Hearing loss of right ear due to cerumen impaction 03/02/2022   Atherosclerosis of aorta (HCC) 06/14/2020   Pulmonary emphysema (HCC) 06/14/2020   Atherosclerosis of native coronary artery of native heart without angina pectoris 06/14/2020   Sacroiliac joint disease 08/02/2017   History of TIA (transient ischemic attack) 08/02/2017   Vitamin D  deficiency 08/02/2017   Arthritis 08/02/2017   Long term current use of anticoagulant therapy 08/02/2017   Left thyroid  nodule 01/01/2015   Benign hypertension with CKD (chronic kidney disease) stage IV (HCC) 11/06/2013   Anemia in chronic  kidney disease 05/28/2013   History of lung cancer 04/07/2013   Essential hypertension, benign 03/05/2013   History of colonic polyps 10/29/2012    Past Medical History:  Diagnosis Date   Arthritis    Benign hypertensive kidney disease with chronic kidney disease stage I through stage IV, or unspecified(403.10)    sees dr. Arlis Lakes again in april   Cancer [facility])    lung ca dx'd 02/2013   Chronic kidney disease, stage III (moderate) (HCC)    Cough 02/26/2013   GERD (gastroesophageal reflux disease)    Headache    Hypertension    Hyperthyroidism    took iodine treatment for this    Lung mass 02/27/2013   Neurologic disorder    2009   Shortness of breath    exertion   Thrombocytopenia, unspecified (HCC) 02/26/2013   Unspecified vitamin D  deficiency     Past Surgical History:  Procedure Laterality Date   BACK SURGERY     EYE SURGERY     cataract   EYE SURGERY Left    open up tear duct   LYMPH NODE DISSECTION Left 04/07/2013   Procedure: LYMPH NODE DISSECTION;  Surgeon: Norita Beauvais, MD;  Location: [facility] OR;  Service: Thoracic;  Laterality: Left;   NECK SURGERY     PORT-A-CATH REMOVAL Left 01/09/2014   Procedure: REMOVAL PORT-A-CATH;  Surgeon: Norita Beauvais, MD;  Location: [facility] OR;  Service: Thoracic;  Laterality: Left;   PORTACATH PLACEMENT Left 05/06/2013   Procedure: INSERTION PORT-A-CATH;  Surgeon: Norita Beauvais, MD;  Location: [facility] OR;  Service: Thoracic;  Laterality: Left;   VIDEO ASSISTED THORACOSCOPY (VATS)/ LOBECTOMY Left 04/07/2013   Procedure: VIDEO ASSISTED THORACOSCOPY (VATS)/ LOBECTOMY; INSERTION OF ON-Q PUMP;  Surgeon: Norita Beauvais, MD;  Location: [facility] OR;  Service: Thoracic;  Laterality: Left;   VIDEO BRONCHOSCOPY N/A 04/07/2013   Procedure: VIDEO BRONCHOSCOPY;  Surgeon: Norita Beauvais, MD;  Location: [facility] OR;  Service: Thoracic;  Laterality: N/A;    Social History   Socioeconomic History   Marital status: Single    Spouse name: Not on file   Number of children: 2   Years of education: 4   Highest education level: Not on file  Occupational History   Not on file  Tobacco Use   Smoking status: Former    Current packs/day: 0.00    Average packs/day: 0.5 packs/day for 60.0 years (30.0 ttl pk-yrs)    Types: Cigarettes    Start date: 02/25/1952    Quit date: 02/25/2012    Years since quitting: 11.2   Smokeless tobacco: Never  Vaping Use   Vaping status: Never Used  Substance and Sexual Activity   Alcohol use: No   Drug use: No   Sexual activity: Not on file  Other Topics Concern   Not on file  Social History Narrative   Right handed    Drinks caffeine   One story home   Lives alone   retired   Social Drivers of Health   Financial Resource Strain: Low Risk  (11/11/2020)   Overall Financial Resource Strain (CARDIA)    Difficulty of Paying Living Expenses: Not hard at all  Food Insecurity: No Food Insecurity (05/19/2022)   Hunger Vital Sign    Worried About Running Out of Food in the Last Year: Never true    Ran Out of Food in the Last Year: Never true  Transportation Needs: No Transportation Needs (05/08/2022)   PRAPARE - Transportation    Lack  of Transportation (Medical): No    Lack of Transportation (Non-Medical): No  Physical Activity: Sufficiently Active (11/11/2020)   Exercise Vital Sign    Days of Exercise per Week: 5 days    Minutes of Exercise per Session: 30 min  Stress: No Stress Concern Present (11/11/2020)   [employer] of Occupational Health - Occupational Stress Questionnaire    Feeling of Stress : Not at all  Social Connections: Moderately Integrated (11/11/2020)   Social Connection and Isolation Panel [NHANES]    Frequency of Communication with Friends and Family: More than three times a week    Frequency of Social Gatherings with Friends and Family: More than three times a week    Attends Religious Services: More than 4 times per year    Active Member of [employer] or Organizations: Yes    Attends Engineer, structural: More than 4 times per year    Marital Status: Never married  Intimate Partner Violence: Not At Risk (05/19/2022)   Humiliation, Afraid, Rape, and Kick questionnaire    Fear of Current or Ex-Partner: No    Emotionally Abused: No    Physically Abused: No    Sexually Abused: No    Family History  Problem Relation Age of Onset   Breast cancer Mother    Lung cancer Father        smoked   Lung cancer Brother        smoked   Rectal cancer Neg Hx    Stomach cancer Neg Hx    Esophageal cancer Neg Hx    Colon cancer Neg Hx      Review of Systems  Constitutional:  Negative.  Negative for chills and fever.  HENT: Negative.  Negative for congestion and sore throat.   Respiratory: Negative.  Negative for cough and shortness of breath.   Cardiovascular: Negative.  Negative for chest pain and palpitations.  Gastrointestinal:  Negative for abdominal pain, diarrhea, nausea and vomiting.  Genitourinary: Negative.  Negative for dysuria and hematuria.  Skin: Negative.  Negative for rash.  Neurological: Negative.  Negative for dizziness and headaches.  All other systems reviewed and are negative.   Vitals:   05/15/23 0901  BP: 122/70  Pulse: (!) 56  Temp: 98.8 F (37.1 C)  SpO2: 99%    Physical Exam Vitals reviewed.  Constitutional:      Appearance: Normal appearance.  HENT:     Head: Normocephalic.     Mouth/Throat:     Mouth: Mucous membranes are moist.     Pharynx: Oropharynx is clear.  Eyes:     Extraocular Movements: Extraocular movements intact.     Pupils: Pupils are equal, round, and reactive to light.  Cardiovascular:     Rate and Rhythm: Normal rate and regular rhythm.     Pulses: Normal pulses.     Heart sounds: Normal heart sounds.  Pulmonary:     Effort: Pulmonary effort is normal.     Breath sounds: Normal breath sounds.  Abdominal:     Palpations: Abdomen is soft.     Tenderness: There is no abdominal tenderness.  Musculoskeletal:     Cervical back: No tenderness.  Lymphadenopathy:     Cervical: No cervical adenopathy.  Skin:    General: Skin is warm and dry.     Capillary Refill: Capillary refill takes less than 2 seconds.     Comments: Moderate sized lipoma right upper arm.  Nontender  Neurological:     General: No focal deficit  present.     Mental Status: She is alert and oriented to person, place, and time.  Psychiatric:        Mood and Affect: Mood normal.        Behavior: Behavior normal.      ASSESSMENT & PLAN: A total of 43 minutes was spent with the patient and counseling/coordination of care regarding  preparing for this visit, review of most recent office visit notes, review of multiple chronic medical conditions and their management, review of all medications, review of most recent bloodwork results, review of health maintenance items, education on nutrition, prognosis, documentation, and need for follow up.   Problem List Items Addressed This Visit       Cardiovascular and Mediastinum   Essential hypertension, benign - Primary   BP Readings from Last 3 Encounters:  05/15/23 122/70  05/01/23 (!) 167/74  03/01/23 126/63  Well-controlled hypertension Continue chlorthalidone  25 mg and telmisartan  40 mg daily       Atherosclerosis of aorta (HCC)   Continue atorvastatin  10 mg daily.       Atherosclerosis of native coronary artery of native heart without angina pectoris   No recent anginal episodes Continue Plavix  75 mg daily along with beta-blocker metoprolol  succinate 25 mg daily        Respiratory   Pulmonary emphysema (HCC)   Stable and asymptomatic        Nervous and Auditory   Malignant neoplasm metastatic to brain (HCC)   Clinically stable.  Recently told metastatic lesion to brain was gone        Other   History of lung cancer   The patient was originally diagnosed with lung cancer after presentation with abnormal cough on background history of smoking, status post surgery, stage Ib Pathology: Adenocarcinoma, Molecular studies done on sample from 2105 showed no actionable mutations. MSI stable, low tumor mutation burden  She completed adjuvant chemotherapy with cisplatin  and Deborha Falls being in 2015   She was found to have recurrent stage IV metastatic cancer to the brain in April 2024 with no signs of systemic disease and completed palliative radiation therapy in May 2024   I reviewed imaging study from April 2025 with the patient which showed no evidence of disease recurrence in her chest MRI of the brain from January 2025 show positive response to treatment with  no signs of recurrence We discussed future follow-up I plan to repeat imaging study again in 6 months, due in October 2025      Lipoma of right upper extremity   Benign mass.  No infection. No concerns.      Patient Instructions  Health Maintenance After Age 34 After age 37, you are at a higher risk for certain long-term diseases and infections as well as injuries from falls. Falls are a major cause of broken bones and head injuries in people who are older than age 72. Getting regular preventive care can help to keep you healthy and well. Preventive care includes getting regular testing and making lifestyle changes as recommended by your health care provider. Talk with your health care provider about: Which screenings and tests you should have. A screening is a test that checks for a disease when you have no symptoms. A diet and exercise plan that is right for you. What should I know about screenings and tests to prevent falls? Screening and testing are the best ways to find a health problem early. Early diagnosis and treatment give you the best chance of  managing medical conditions that are common after age 28. Certain conditions and lifestyle choices may make you more likely to have a fall. Your health care provider may recommend: Regular vision checks. Poor vision and conditions such as cataracts can make you more likely to have a fall. If you wear glasses, make sure to get your prescription updated if your vision changes. Medicine review. Work with your health care provider to regularly review all of the medicines you are taking, including over-the-counter medicines. Ask your health care provider about any side effects that may make you more likely to have a fall. Tell your health care provider if any medicines that you take make you feel dizzy or sleepy. Strength and balance checks. Your health care provider may recommend certain tests to check your strength and balance while standing,  walking, or changing positions. Foot health exam. Foot pain and numbness, as well as not wearing proper footwear, can make you more likely to have a fall. Screenings, including: Osteoporosis screening. Osteoporosis is a condition that causes the bones to get weaker and break more easily. Blood pressure screening. Blood pressure changes and medicines to control blood pressure can make you feel dizzy. Depression screening. You may be more likely to have a fall if you have a fear of falling, feel depressed, or feel unable to do activities that you used to do. Alcohol use screening. Using too much alcohol can affect your balance and may make you more likely to have a fall. Follow these instructions at home: Lifestyle Do not drink alcohol if: Your health care provider tells you not to drink. If you drink alcohol: Limit how much you have to: 0-1 drink a day for women. 0-2 drinks a day for men. Know how much alcohol is in your drink. In the [place] one drink equals one 12 oz bottle of beer (355 mL), one 5 oz glass of wine (148 mL), or one 1 oz glass of hard liquor (44 mL). Do not use any products that contain nicotine or tobacco. These products include cigarettes, chewing tobacco, and vaping devices, such as e-cigarettes. If you need help quitting, ask your health care provider. Activity  Follow a regular exercise program to stay fit. This will help you maintain your balance. Ask your health care provider what types of exercise are appropriate for you. If you need a cane or walker, use it as recommended by your health care provider. Wear supportive shoes that have nonskid soles. Safety  Remove any tripping hazards, such as rugs, cords, and clutter. Install safety equipment such as grab bars in bathrooms and safety rails on stairs. Keep rooms and walkways well-lit. General instructions Talk with your health care provider about your risks for falling. Tell your health care provider if: You fall.  Be sure to tell your health care provider about all falls, even ones that seem minor. You feel dizzy, tiredness (fatigue), or off-balance. Take over-the-counter and prescription medicines only as told by your health care provider. These include supplements. Eat a healthy diet and maintain a healthy weight. A healthy diet includes low-fat dairy products, low-fat (lean) meats, and fiber from whole grains, beans, and lots of fruits and vegetables. Stay current with your vaccines. Schedule regular health, dental, and eye exams. Summary Having a healthy lifestyle and getting preventive care can help to protect your health and wellness after age 35. Screening and testing are the best way to find a health problem early and help you avoid having a fall. Early diagnosis  and treatment give you the best chance for managing medical conditions that are more common for people who are older than age 36. Falls are a major cause of broken bones and head injuries in people who are older than age 5. Take precautions to prevent a fall at home. Work with your health care provider to learn what changes you can make to improve your health and wellness and to prevent falls. This information is not intended to replace advice given to you by your health care provider. Make sure you discuss any questions you have with your health care provider. Document Revised: 05/31/2020 Document Reviewed: 05/31/2020 Elsevier Patient Education  2024 Elsevier Inc.     Maryagnes Small, MD [facility]

## 2023-05-15 NOTE — Assessment & Plan Note (Signed)
 Benign mass.  No infection. No concerns.

## 2023-05-15 NOTE — Assessment & Plan Note (Signed)
 BP Readings from Last 3 Encounters:  05/15/23 122/70  05/01/23 (!) 167/74  03/01/23 126/63  Well-controlled hypertension Continue chlorthalidone  25 mg and telmisartan  40 mg daily

## 2023-05-15 NOTE — Assessment & Plan Note (Signed)
No recent anginal episodes Continue Plavix 75 mg daily along with beta-blocker metoprolol succinate 25 mg daily

## 2023-05-15 NOTE — Assessment & Plan Note (Signed)
 Clinically stable.  Recently told metastatic lesion to brain was gone

## 2023-05-15 NOTE — Assessment & Plan Note (Signed)
 The patient was originally diagnosed with lung cancer after presentation with abnormal cough on background history of smoking, status post surgery, stage Ib Pathology: Adenocarcinoma, Molecular studies done on sample from 2105 showed no actionable mutations. MSI stable, low tumor mutation burden  She completed adjuvant chemotherapy with cisplatin and Elson Areas being in 2015  She was found to have recurrent stage IV metastatic cancer to the brain in April 2024 with no signs of systemic disease and completed palliative radiation therapy in May 2024  I reviewed imaging study from April 2025 with the patient which showed no evidence of disease recurrence in her chest MRI of the brain from January 2025 show positive response to treatment with no signs of recurrence We discussed future follow-up I plan to repeat imaging study again in 6 months, due in October 2025

## 2023-05-15 NOTE — Patient Instructions (Signed)
 Health Maintenance After Age 86 After age 4, you are at a higher risk for certain long-term diseases and infections as well as injuries from falls. Falls are a major cause of broken bones and head injuries in people who are older than age 47. Getting regular preventive care can help to keep you healthy and well. Preventive care includes getting regular testing and making lifestyle changes as recommended by your health care provider. Talk with your health care provider about: Which screenings and tests you should have. A screening is a test that checks for a disease when you have no symptoms. A diet and exercise plan that is right for you. What should I know about screenings and tests to prevent falls? Screening and testing are the best ways to find a health problem early. Early diagnosis and treatment give you the best chance of managing medical conditions that are common after age 37. Certain conditions and lifestyle choices may make you more likely to have a fall. Your health care provider may recommend: Regular vision checks. Poor vision and conditions such as cataracts can make you more likely to have a fall. If you wear glasses, make sure to get your prescription updated if your vision changes. Medicine review. Work with your health care provider to regularly review all of the medicines you are taking, including over-the-counter medicines. Ask your health care provider about any side effects that may make you more likely to have a fall. Tell your health care provider if any medicines that you take make you feel dizzy or sleepy. Strength and balance checks. Your health care provider may recommend certain tests to check your strength and balance while standing, walking, or changing positions. Foot health exam. Foot pain and numbness, as well as not wearing proper footwear, can make you more likely to have a fall. Screenings, including: Osteoporosis screening. Osteoporosis is a condition that causes  the bones to get weaker and break more easily. Blood pressure screening. Blood pressure changes and medicines to control blood pressure can make you feel dizzy. Depression screening. You may be more likely to have a fall if you have a fear of falling, feel depressed, or feel unable to do activities that you used to do. Alcohol use screening. Using too much alcohol can affect your balance and may make you more likely to have a fall. Follow these instructions at home: Lifestyle Do not drink alcohol if: Your health care provider tells you not to drink. If you drink alcohol: Limit how much you have to: 0-1 drink a day for women. 0-2 drinks a day for men. Know how much alcohol is in your drink. In the [place] one drink equals one 12 oz bottle of beer (355 mL), one 5 oz glass of wine (148 mL), or one 1 oz glass of hard liquor (44 mL). Do not use any products that contain nicotine or tobacco. These products include cigarettes, chewing tobacco, and vaping devices, such as e-cigarettes. If you need help quitting, ask your health care provider. Activity  Follow a regular exercise program to stay fit. This will help you maintain your balance. Ask your health care provider what types of exercise are appropriate for you. If you need a cane or walker, use it as recommended by your health care provider. Wear supportive shoes that have nonskid soles. Safety  Remove any tripping hazards, such as rugs, cords, and clutter. Install safety equipment such as grab bars in bathrooms and safety rails on stairs. Keep rooms and walkways  well-lit. General instructions Talk with your health care provider about your risks for falling. Tell your health care provider if: You fall. Be sure to tell your health care provider about all falls, even ones that seem minor. You feel dizzy, tiredness (fatigue), or off-balance. Take over-the-counter and prescription medicines only as told by your health care provider. These include  supplements. Eat a healthy diet and maintain a healthy weight. A healthy diet includes low-fat dairy products, low-fat (lean) meats, and fiber from whole grains, beans, and lots of fruits and vegetables. Stay current with your vaccines. Schedule regular health, dental, and eye exams. Summary Having a healthy lifestyle and getting preventive care can help to protect your health and wellness after age 11. Screening and testing are the best way to find a health problem early and help you avoid having a fall. Early diagnosis and treatment give you the best chance for managing medical conditions that are more common for people who are older than age 28. Falls are a major cause of broken bones and head injuries in people who are older than age 48. Take precautions to prevent a fall at home. Work with your health care provider to learn what changes you can make to improve your health and wellness and to prevent falls. This information is not intended to replace advice given to you by your health care provider. Make sure you discuss any questions you have with your health care provider. Document Revised: 05/31/2020 Document Reviewed: 05/31/2020 Elsevier Patient Education  2024 [employer].

## 2023-05-15 NOTE — Assessment & Plan Note (Signed)
 Stable and asymptomatic

## 2023-05-15 NOTE — Assessment & Plan Note (Signed)
 Continue atorvastatin 10 mg daily

## 2023-05-16 ENCOUNTER — Ambulatory Visit: Payer: 59 | Admitting: Physician Assistant

## 2023-05-16 ENCOUNTER — Encounter: Payer: Self-pay | Admitting: Physician Assistant

## 2023-05-29 ENCOUNTER — Ambulatory Visit (INDEPENDENT_AMBULATORY_CARE_PROVIDER_SITE_OTHER): Admitting: Physician Assistant

## 2023-05-29 ENCOUNTER — Encounter: Payer: Self-pay | Admitting: Physician Assistant

## 2023-05-29 VITALS — BP 141/61 | HR 52 | Resp 18 | Wt 138.0 lb

## 2023-05-29 DIAGNOSIS — R413 Other amnesia: Secondary | ICD-10-CM

## 2023-05-29 NOTE — Patient Instructions (Signed)
 It was a pleasure to see you today at our office.   Recommendations:   Follow up in 6 months    Consider memory pill if memory worse     RECOMMENDATIONS FOR ALL PATIENTS WITH MEMORY PROBLEMS: 1. Continue to exercise (Recommend 30 minutes of walking everyday, or 3 hours every week) 2. Increase social interactions - continue going to [place] and enjoy social gatherings with friends and family 3. Eat healthy, avoid fried foods and eat more fruits and vegetables 4. Maintain adequate blood pressure, blood sugar, and blood cholesterol level. Reducing the risk of stroke and cardiovascular disease also helps promoting better memory. 5. Avoid stressful situations. Live a simple life and avoid aggravations. Organize your time and prepare for the next day in anticipation. 6. Sleep well, avoid any interruptions of sleep and avoid any distractions in the bedroom that may interfere with adequate sleep quality 7. Avoid sugar, avoid sweets as there is a strong link between excessive sugar intake, diabetes, and cognitive impairment We discussed the Mediterranean diet, which has been shown to help patients reduce the risk of progressive memory disorders and reduces cardiovascular risk. This includes eating fish, eat fruits and green leafy vegetables, nuts like almonds and hazelnuts, walnuts, and also use olive oil. Avoid fast foods and fried foods as much as possible. Avoid sweets and sugar as sugar use has been linked to worsening of memory function.  There is always a concern of gradual progression of memory problems. If this is the case, then we may need to adjust level of care according to patient needs. Support, both to the patient and caregiver, should then be put into place.         FALL PRECAUTIONS: Be cautious when walking. Scan the area for obstacles that may increase the risk of trips and falls. When getting up in the mornings, sit up at the edge of the bed for a few minutes before getting out of  bed. Consider elevating the bed at the head end to avoid drop of blood pressure when getting up. Walk always in a well-lit room (use night lights in the walls). Avoid area rugs or power cords from appliances in the middle of the walkways. Use a walker or a cane if necessary and consider physical therapy for balance exercise. Get your eyesight checked regularly.  FINANCIAL OVERSIGHT: Supervision, especially oversight when making financial decisions or transactions is also recommended.  HOME SAFETY: Consider the safety of the kitchen when operating appliances like stoves, microwave oven, and blender. Consider having supervision and share cooking responsibilities until no longer able to participate in those. Accidents with firearms and other hazards in the house should be identified and addressed as well.   ABILITY TO BE LEFT ALONE: If patient is unable to contact 911 operator, consider using LifeLine, or when the need is there, arrange for someone to stay with patients. Smoking is a fire hazard, consider supervision or cessation. Risk of wandering should be assessed by caregiver and if detected at any point, supervision and safe proof recommendations should be instituted.  MEDICATION SUPERVISION: Inability to self-administer medication needs to be constantly addressed. Implement a mechanism to ensure safe administration of the medications.   DRIVING: Regarding driving, in patients with progressive memory problems, driving will be impaired. We advise to have someone else do the driving if trouble finding directions or if minor accidents are reported. Independent driving assessment is available to determine safety of driving.   If you are interested in the driving  assessment, you can contact the following:  [facility] [employer] [facility] in [place] [phone]  [facility] [phone]  [facility] [phone] or  [phone]    Mediterranean Diet A Mediterranean diet refers to food and lifestyle choices that are based on the traditions of countries located on the [employer]. This way of eating has been shown to help prevent certain conditions and improve outcomes for people who have chronic diseases, like kidney disease and heart disease. What are tips for following this plan? Lifestyle  Cook and eat meals together with your family, when possible. Drink enough fluid to keep your urine clear or pale yellow. Be physically active every day. This includes: Aerobic exercise like running or swimming. Leisure activities like gardening, walking, or housework. Get 7-8 hours of sleep each night. If recommended by your health care provider, drink red wine in moderation. This means 1 glass a day for nonpregnant women and 2 glasses a day for men. A glass of wine equals 5 oz (150 mL). Reading food labels  Check the serving size of packaged foods. For foods such as rice and pasta, the serving size refers to the amount of cooked product, not dry. Check the total fat in packaged foods. Avoid foods that have saturated fat or trans fats. Check the ingredients list for added sugars, such as corn syrup. Shopping  At the grocery store, buy most of your food from the areas near the walls of the store. This includes: Fresh fruits and vegetables (produce). Grains, beans, nuts, and seeds. Some of these may be available in unpackaged forms or large amounts (in bulk). Fresh seafood. Poultry and eggs. Low-fat dairy products. Buy whole ingredients instead of prepackaged foods. Buy fresh fruits and vegetables in-season from local farmers markets. Buy frozen fruits and vegetables in resealable bags. If you do not have access to quality fresh seafood, buy precooked frozen shrimp or canned fish, such as tuna, salmon, or sardines. Buy small amounts of raw or cooked vegetables, salads, or olives from the deli or salad bar  at your store. Stock your pantry so you always have certain foods on hand, such as olive oil, canned tuna, canned tomatoes, rice, pasta, and beans. Cooking  Cook foods with extra-virgin olive oil instead of using butter or other vegetable oils. Have meat as a side dish, and have vegetables or grains as your main dish. This means having meat in small portions or adding small amounts of meat to foods like pasta or stew. Use beans or vegetables instead of meat in common dishes like chili or lasagna. Experiment with different cooking methods. Try roasting or broiling vegetables instead of steaming or sauteing them. Add frozen vegetables to soups, stews, pasta, or rice. Add nuts or seeds for added healthy fat at each meal. You can add these to yogurt, salads, or vegetable dishes. Marinate fish or vegetables using olive oil, lemon juice, garlic, and fresh herbs. Meal planning  Plan to eat 1 vegetarian meal one day each week. Try to work up to 2 vegetarian meals, if possible. Eat seafood 2 or more times a week. Have healthy snacks readily available, such as: Vegetable sticks with hummus. Greek yogurt. Fruit and nut trail mix. Eat balanced meals throughout the week. This includes: Fruit: 2-3 servings a day Vegetables: 4-5 servings a day Low-fat dairy: 2 servings a day Fish, poultry, or lean meat: 1 serving a day Beans and legumes: 2 or more servings a week Nuts and seeds: 1-2  servings a day Whole grains: 6-8 servings a day Extra-virgin olive oil: 3-4 servings a day Limit red meat and sweets to only a few servings a month What are my food choices? Mediterranean diet Recommended Grains: Whole-grain pasta. Brown rice. Bulgar wheat. Polenta. Couscous. Whole-wheat bread. Dwyane Glad. Vegetables: Artichokes. Beets. Broccoli. Cabbage. Carrots. Eggplant. Green beans. Chard. Kale. Spinach. Onions. Leeks. Peas. Squash. Tomatoes. Peppers. Radishes. Fruits: Apples. Apricots. Avocado. Berries.  Bananas. Cherries. Dates. Figs. Grapes. Lemons. Melon. Oranges. Peaches. Plums. Pomegranate. Meats and other protein foods: Beans. Almonds. Sunflower seeds. Pine nuts. Peanuts. Cod. Salmon. Scallops. Shrimp. Tuna. Tilapia. Clams. Oysters. Eggs. Dairy: Low-fat milk. Cheese. Greek yogurt. Beverages: Water . Red wine. Herbal tea. Fats and oils: Extra virgin olive oil. Avocado oil. Grape seed oil. Sweets and desserts: [place] yogurt with honey. Baked apples. Poached pears. Trail mix. Seasoning and other foods: Basil. Cilantro. Coriander. Cumin. Mint. Parsley. Sage. Rosemary. Tarragon. Garlic. Oregano. Thyme. Pepper. Balsalmic vinegar. Tahini. Hummus. Tomato sauce. Olives. Mushrooms. Limit these Grains: Prepackaged pasta or rice dishes. Prepackaged cereal with added sugar. Vegetables: Deep fried potatoes (french fries). Fruits: Fruit canned in syrup. Meats and other protein foods: Beef. Pork. Lamb. Poultry with skin. Hot dogs. Helene Loader. Dairy: Ice cream. Sour cream. Whole milk. Beverages: Juice. Sugar-sweetened soft drinks. Beer. Liquor and spirits. Fats and oils: Butter. Canola oil. Vegetable oil. Beef fat (tallow). Lard. Sweets and desserts: Cookies. Cakes. Pies. Candy. Seasoning and other foods: Mayonnaise. Premade sauces and marinades. The items listed may not be a complete list. Talk with your dietitian about what dietary choices are right for you. Summary The Mediterranean diet includes both food and lifestyle choices. Eat a variety of fresh fruits and vegetables, beans, nuts, seeds, and whole grains. Limit the amount of red meat and sweets that you eat. Talk with your health care provider about whether it is safe for you to drink red wine in moderation. This means 1 glass a day for nonpregnant women and 2 glasses a day for men. A glass of wine equals 5 oz (150 mL). This information is not intended to replace advice given to you by your health care provider. Make sure you discuss any questions you  have with your health care provider. Document Released: 09/02/2015 Document Revised: 10/05/2015 Document Reviewed: 09/02/2015 Elsevier Interactive Patient Education  2017 [employer].   Labs today [address] [place] [facility] [phone]
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
## 2023-05-30 ENCOUNTER — Other Ambulatory Visit: Payer: Self-pay | Admitting: Hematology and Oncology

## 2023-05-30 DIAGNOSIS — I129 Hypertensive chronic kidney disease with stage 1 through stage 4 chronic kidney disease, or unspecified chronic kidney disease: Secondary | ICD-10-CM

## 2023-06-14 ENCOUNTER — Telehealth: Payer: Self-pay | Admitting: Emergency Medicine

## 2023-06-14 NOTE — Telephone Encounter (Signed)
 Copied from CRM [id number]. Topic: Clinical - Home Health Verbal Orders >> Jun 14, 2023 11:58 AM Baldomero Bone wrote: Caller/Agency: Jenette Mitchell with [facility] Callback Number: [phone] confidential VM Service Requested: NA Frequency: NA Any new concerns about the patient? Yes; disoriented and did not pass the mini mental exam; patient is still driving

## 2023-06-20 ENCOUNTER — Other Ambulatory Visit: Payer: Self-pay | Admitting: Hematology and Oncology

## 2023-06-20 DIAGNOSIS — I129 Hypertensive chronic kidney disease with stage 1 through stage 4 chronic kidney disease, or unspecified chronic kidney disease: Secondary | ICD-10-CM

## 2023-06-21 ENCOUNTER — Ambulatory Visit
Admission: RE | Admit: 2023-06-21 | Discharge: 2023-06-21 | Disposition: A | Discharge status: Home / Self Care | Source: Ambulatory Visit | Attending: Internal Medicine | Admitting: Internal Medicine

## 2023-06-21 DIAGNOSIS — C7931 Secondary malignant neoplasm of brain: Secondary | ICD-10-CM

## 2023-06-21 DIAGNOSIS — C3490 Malignant neoplasm of unspecified part of unspecified bronchus or lung: Secondary | ICD-10-CM | POA: Diagnosis not present

## 2023-06-21 MED ORDER — GADOPICLENOL 0.5 MMOL/ML IV SOLN
6.0000 mL | Freq: Once | INTRAVENOUS | Status: AC | PRN
  Administered 2023-06-21: 6 mL via INTRAVENOUS

## 2023-06-28 ENCOUNTER — Telehealth: Payer: Self-pay | Admitting: Internal Medicine

## 2023-06-28 ENCOUNTER — Inpatient Hospital Stay: Payer: 59 | Attending: Internal Medicine | Admitting: Internal Medicine

## 2023-06-28 DIAGNOSIS — M25561 Pain in right knee: Secondary | ICD-10-CM | POA: Diagnosis not present

## 2023-07-05 ENCOUNTER — Inpatient Hospital Stay: Admitting: Internal Medicine

## 2023-07-12 ENCOUNTER — Telehealth: Payer: Self-pay | Admitting: *Deleted

## 2023-07-12 NOTE — Telephone Encounter (Signed)
 PC to patient, no answer, left VM - informed her we would like to schedule a F/U appointment with Dr Mark Sil to review MRI results from May.  Instructed patient to contact our scheduling department, [phone].

## 2023-07-18 ENCOUNTER — Other Ambulatory Visit: Payer: Self-pay | Admitting: Hematology and Oncology

## 2023-07-18 DIAGNOSIS — I129 Hypertensive chronic kidney disease with stage 1 through stage 4 chronic kidney disease, or unspecified chronic kidney disease: Secondary | ICD-10-CM

## 2023-07-24 ENCOUNTER — Inpatient Hospital Stay: Attending: Internal Medicine | Admitting: Internal Medicine

## 2023-07-24 VITALS — BP 168/66 | HR 52 | Temp 97.6°F | Resp 13 | Wt 135.1 lb

## 2023-07-24 DIAGNOSIS — Z85118 Personal history of other malignant neoplasm of bronchus and lung: Secondary | ICD-10-CM | POA: Insufficient documentation

## 2023-07-24 DIAGNOSIS — Z801 Family history of malignant neoplasm of trachea, bronchus and lung: Secondary | ICD-10-CM | POA: Diagnosis not present

## 2023-07-24 DIAGNOSIS — Z87891 Personal history of nicotine dependence: Secondary | ICD-10-CM | POA: Insufficient documentation

## 2023-07-24 DIAGNOSIS — C7931 Secondary malignant neoplasm of brain: Secondary | ICD-10-CM | POA: Diagnosis not present

## 2023-07-24 DIAGNOSIS — Z923 Personal history of irradiation: Secondary | ICD-10-CM | POA: Diagnosis not present

## 2023-07-24 DIAGNOSIS — Z803 Family history of malignant neoplasm of breast: Secondary | ICD-10-CM | POA: Insufficient documentation

## 2023-07-24 NOTE — Progress Notes (Signed)
 [facility] [address]  [place], [place] [address] [phone]   Interval Evaluation  Date of Service: 07/24/23 Patient Name: [name] Patient MRN: [id number] Patient DOB: [birth year] Provider: Arthea MARLA Manns, MD  Identifying Statement:  [name] is a 88 y.o. female with Malignant neoplasm metastatic to brain [facility])    Primary Cancer:  Oncologic History: Oncology History Overview Note  Non-small cell lung cancer, adenocarcinoma   Primary site: Lung (Left)   Staging method: AJCC 7th Edition   Clinical: Stage IB (T2a, N0, M0) signed by Almarie Bedford, MD on 04/29/2013  9:01 PM   Pathologic: Stage IB (T2a, N0, cM0) signed by [place] KATHEE Jude, MD on 04/09/2013 12:51 PM   Summary: Stage IB (T2a, N0, cM0) Molecular studies done on sample from 2105 showed no actionable mutations. MSI stable, low tumor mutation burden    History of lung cancer  02/26/2013 Imaging   CXR for evaluation of cough showed new left lung mass   02/28/2013 Imaging   CT chest showed left new lung nodule   03/14/2013 Imaging   PET/CT scan showed localized disease in the left lung   04/07/2013 Surgery   She underwent bronchoscopy and left lower lobe resection and lymph node sampling with negative margins   05/06/2013 Procedure   The patient has placement of Infuse-a-Port   05/21/2013 - 07/30/2013 Chemotherapy   She completed 4 cycles of adjuvant chemotherapy with navelbine  and cisplatin    07/01/2013 Imaging   CT scan of the chest shows stable appearance.   07/02/2013 Adverse Reaction   Dose of chemotherapy was adjusted further due to severe anemia.   12/31/2013 Imaging   CT scan of the chest show no evidence of disease recurrence   07/03/2014 Imaging   Chest x-ray looks normal   12/31/2014 Imaging   Ct chest showed no evidence of recurrence   05/31/2015 Imaging   Status post resection of a left lower lobe lung lesion without findings for recurrent tumor or  metastatic disease.    05/29/2016 Imaging   1. Stable exam. Status post left lower lobectomy without evidence for new or progressive findings. 2. Emphysema. 3. Thoracic aortic atherosclerosis   05/30/2017 Imaging   1. Slight interval increase in size of right upper lobe nodule. Recommend attention on follow-up. 2. Stable appearance of the left hemithorax with left lower lobectomy. 3. Aortic Atherosclerosis (ICD10-I70.0) and Emphysema (ICD10-J43.9).   11/13/2018 Imaging   1. No evidence of recurrent or metastatic disease. 2. Clustered peribronchovascular nodularity in the right upper lobe appears stable. 3. 7 mm left upper lobe ground-glass nodule, stable. Continued attention on follow-up exams is warranted. 4. Aortic atherosclerosis (ICD10-170.0). Coronary artery calcification. 5.  Emphysema (ICD10-J43.9).   05/19/2022 PET scan   NM PET Image Restage (PS) Skull Base to Thigh (F-18 FDG)  Result Date: 05/19/2022 CLINICAL DATA:  Subsequent treatment strategy for metastatic lung cancer. EXAM: NUCLEAR MEDICINE PET SKULL BASE TO THIGH TECHNIQUE: 6.84 mCi F-18 FDG was injected intravenously. Full-ring PET imaging was performed from the skull base to thigh after the radiotracer. CT data was obtained and used for attenuation correction and anatomic localization. Fasting blood glucose: 98 mg/dl COMPARISON:  CT 95/86/7975 FINDINGS: Mediastinal blood pool activity: SUV max 3.67 Liver activity: SUV max NA NECK: Mass within the cerebellum noted on MRI from 05/17/2022 is tracer avid within SUV max of 26.70. Incidental CT findings: No tracer avid cervical lymph nodes. Nodule in the left lobe of  thyroid  gland containing calcifications measures 2.1 cm. No significant increased radiotracer uptake identified within this nodule. This has been evaluated on previous imaging. (ref: J Am Coll Radiol. 2015 Feb;12(2): 143-50). CHEST: No tracer avid mediastinal, hilar or axillary nodes. There are no tracer avid pulmonary  nodules identified. Subsolid nodule in the posterior left lower lobe measures 7 mm without significant tracer uptake, image 38/7. This is technically too small to reliably characterize however low by PET-CT. The irregular nodule within the periphery of the right upper lobe measuring 1 cm is stable from the previous exam without significant tracer uptake, image 20/7. Focal nodular thickening over the posterior lateral left lower lobe measures approximately 1.1 by 0.7 cm without significant tracer uptake, image 79/4. Incidental CT findings: Emphysema.Aortic atherosclerosis coronary artery calcifications. ABDOMEN/PELVIS: No abnormal tracer uptake identified within the liver, pancreas, spleen, or adrenal glands. Incidental CT findings: Aortic atherosclerosis. Small hiatal hernia. 3.7 cm right kidney cyst. No follow-up imaging recommended. SKELETON: No focal hypermetabolic activity to suggest skeletal metastasis. Incidental CT findings: None. IMPRESSION: 1. The mass within the cerebellum noted on MRI from 05/17/2022 is tracer avid with SUV max of 26.70. 2. No signs of tracer avid nodal metastasis or solid organ metastasis within the chest, abdomen or pelvis. 3. Stable small pulmonary nodules are identified without significant tracer uptake. 4. Coronary artery calcifications. 5.  Aortic Atherosclerosis (ICD10-I70.0). Electronically Signed   By: Waddell Calk M.D.   On: 05/19/2022 09:28   MR Brain W Wo Contrast  Result Date: 05/18/2022 CLINICAL DATA:  Brain/CNS neoplasm, staging 3T SRS Protocol for radiation treatment planning. EXAM: MRI HEAD WITHOUT AND WITH CONTRAST TECHNIQUE: Multiplanar, multiecho pulse sequences of the brain and surrounding structures were obtained without and with intravenous contrast. CONTRAST:  6.5 mL Vueway . COMPARISON:  MRI brain 05/06/2022. FINDINGS: Brain: Unchanged 27 x 22 mm enhancing mass with peripheral hemosiderin staining and surrounding vasogenic edema centered within the medial  aspect of the left cerebellar hemisphere (image 44 series 13). No new enhancing lesions. Unchanged mass effect on the inferior fourth ventricle. Stable size and configuration of the third and lateral ventricles. No acute infarct or acute hemorrhage. Unchanged encephalomalacia in the left anterior temporal lobe, likely secondary to prior trauma. Vascular: Normal flow voids and enhancement. Skull and upper cervical spine: Normal marrow signal and enhancement. Sinuses/Orbits: Unremarkable. Other: None. IMPRESSION: 1. Unchanged 27 mm enhancing mass in the left cerebellar hemisphere with surrounding vasogenic edema. No new enhancing lesions. 2. Stable size and configuration of the third and lateral ventricles. Electronically Signed   By: Ryan Chess M.D.   On: 05/18/2022 11:18   MR BRAIN W CONTRAST  Result Date: 05/06/2022 CLINICAL DATA:  Brain metastases, unknown primary EXAM: MRI HEAD WITH CONTRAST TECHNIQUE: Multiplanar, multiecho pulse sequences of the brain and surrounding structures were obtained with intravenous contrast. CONTRAST:  6.2mL GADAVIST  GADOBUTROL  1 MMOL/ML IV SOLN COMPARISON:  Same day MRI head. FINDINGS: Postcontrast only imaging was performed to further evaluate the abnormality seen on same day noncontrast MRI. The mass within the left cerebellum is enhancing, compatible with a metastasis given the clinical history. No other enhancing lesions identified. Please see same day MRI head for further characterization, including effacement of the fourth ventricle in ventriculomegaly. IMPRESSION: Postcontrast only imaging. The mass within the left cerebellum is enhancing, compatible with a metastasis given the clinical history. No other enhancing lesions identified. Electronically Signed   By: Gilmore GORMAN Molt M.D.   On: 05/06/2022 15:17   CT Chest Wo Contrast  Result Date: 05/06/2022 CLINICAL DATA:  History of lung cancer. New hemorrhagic mass in the cerebellum. Restage lung cancer. * Tracking  Code: BO * EXAM: CT CHEST, ABDOMEN AND PELVIS WITHOUT CONTRAST TECHNIQUE: Multidetector CT imaging of the chest, abdomen and pelvis was performed following the standard protocol without IV contrast. RADIATION DOSE REDUCTION: This exam was performed according to the departmental dose-optimization program which includes automated exposure control, adjustment of the mA and/or kV according to patient size and/or use of iterative reconstruction technique. COMPARISON:  CT chest from 11/28/2021 FINDINGS: CT CHEST FINDINGS Cardiovascular: Heart size is normal. Aortic atherosclerosis. Coronary artery calcifications. No pericardial effusion. Mediastinum/Nodes: Asymmetric heterogeneous enlargement of the left lobe of thyroid  gland. This has been evaluated on previous imaging. (ref: J Am Coll Radiol. 2015 Feb;12(2): 143-50). Trachea appears patent and midline. Normal appearance of the esophagus. No mediastinal or axillary adenopathy. Hilar structures are suboptimally evaluated due to lack of IV contrast. Lungs/Pleura: Central airways appear patent. No pleural effusion or airspace consolidation. Mild to moderate emphysema. Status post left lower lobectomy. Scattered lung nodules are identified, including: -within the periphery of the right lower lobe there is an irregular nodule measuring 1 cm, image 63/4. Previously this measured 0.9 cm. -Sub solid nodule in the posterior left lower lung measures 0.8 cm, image 93/4. Formally 0.6 cm. -Subpleural nodular density over the posterolateral left lower lung measures 1.2 x 0.5 cm, image 95/4. Previously this measured the same. Musculoskeletal: Status post ACDF within the cervical spine. No acute or suspicious bone lesions. No chest wall mass. CT ABDOMEN PELVIS FINDINGS Hepatobiliary: There are 2 small low-attenuation foci within the left lobe of liver and posteromedial right lobe of liver measuring up to 8 mm. These are technically too small to reliably characterize but appear  unchanged. Gallbladder is unremarkable. Pancreas: Unremarkable. No pancreatic ductal dilatation or surrounding inflammatory changes. Spleen: Normal in size without focal abnormality. Adrenals/Urinary Tract: Normal appearance of the adrenal glands. No nephrolithiasis or hydronephrosis. Bilateral simple appearing kidney cysts are noted. The largest is in the lateral cortex of the right kidney measuring 3.6 cm, image 65/3. No follow-up imaging recommended. No hydronephrosis identified bilaterally. Urinary bladder appears normal. Stomach/Bowel: Small hiatal hernia. Stomach is nondistended. The appendix is visualized and appears normal. No pathologic dilatation of the large or small bowel loops. Distal colonic diverticula noted without signs of acute diverticulitis. Vascular/Lymphatic: Aortic atherosclerosis. No aneurysm. No signs of abdominopelvic adenopathy. Reproductive: Partially calcified fibroid is noted anteriorly measuring 1.9 cm. No adnexal mass identified. Other: No ascites or focal fluid collections. No signs of peritoneal nodularity or mass. Musculoskeletal: Previous PLIF at L4-5. Degenerative disc disease is noted at the remaining lumbar spine levels. Bilateral facet arthropathy. No acute or suspicious osseous findings. IMPRESSION: 1. Several bilateral lung nodules are again noted. There has been no significant change in the size of these nodules (within 1-2 mm). No new pulmonary lesions identified when compared with previous exam. 2. No evidence for metastatic disease to the abdomen or pelvis. 3. Coronary artery calcifications. 4. Aortic Atherosclerosis (ICD10-I70.0) and Emphysema (ICD10-J43.9). Electronically Signed   By: Waddell Calk M.D.   On: 05/06/2022 13:56   CT ABDOMEN PELVIS WO CONTRAST  Result Date: 05/06/2022 CLINICAL DATA:  History of lung cancer. New hemorrhagic mass in the cerebellum. Restage lung cancer. * Tracking Code: BO * EXAM: CT CHEST, ABDOMEN AND PELVIS WITHOUT CONTRAST TECHNIQUE:  Multidetector CT imaging of the chest, abdomen and pelvis was performed following the standard protocol without IV contrast. RADIATION  DOSE REDUCTION: This exam was performed according to the departmental dose-optimization program which includes automated exposure control, adjustment of the mA and/or kV according to patient size and/or use of iterative reconstruction technique. COMPARISON:  CT chest from 11/28/2021 FINDINGS: CT CHEST FINDINGS Cardiovascular: Heart size is normal. Aortic atherosclerosis. Coronary artery calcifications. No pericardial effusion. Mediastinum/Nodes: Asymmetric heterogeneous enlargement of the left lobe of thyroid  gland. This has been evaluated on previous imaging. (ref: J Am Coll Radiol. 2015 Feb;12(2): 143-50). Trachea appears patent and midline. Normal appearance of the esophagus. No mediastinal or axillary adenopathy. Hilar structures are suboptimally evaluated due to lack of IV contrast. Lungs/Pleura: Central airways appear patent. No pleural effusion or airspace consolidation. Mild to moderate emphysema. Status post left lower lobectomy. Scattered lung nodules are identified, including: -within the periphery of the right lower lobe there is an irregular nodule measuring 1 cm, image 63/4. Previously this measured 0.9 cm. -Sub solid nodule in the posterior left lower lung measures 0.8 cm, image 93/4. Formally 0.6 cm. -Subpleural nodular density over the posterolateral left lower lung measures 1.2 x 0.5 cm, image 95/4. Previously this measured the same. Musculoskeletal: Status post ACDF within the cervical spine. No acute or suspicious bone lesions. No chest wall mass. CT ABDOMEN PELVIS FINDINGS Hepatobiliary: There are 2 small low-attenuation foci within the left lobe of liver and posteromedial right lobe of liver measuring up to 8 mm. These are technically too small to reliably characterize but appear unchanged. Gallbladder is unremarkable. Pancreas: Unremarkable. No pancreatic  ductal dilatation or surrounding inflammatory changes. Spleen: Normal in size without focal abnormality. Adrenals/Urinary Tract: Normal appearance of the adrenal glands. No nephrolithiasis or hydronephrosis. Bilateral simple appearing kidney cysts are noted. The largest is in the lateral cortex of the right kidney measuring 3.6 cm, image 65/3. No follow-up imaging recommended. No hydronephrosis identified bilaterally. Urinary bladder appears normal. Stomach/Bowel: Small hiatal hernia. Stomach is nondistended. The appendix is visualized and appears normal. No pathologic dilatation of the large or small bowel loops. Distal colonic diverticula noted without signs of acute diverticulitis. Vascular/Lymphatic: Aortic atherosclerosis. No aneurysm. No signs of abdominopelvic adenopathy. Reproductive: Partially calcified fibroid is noted anteriorly measuring 1.9 cm. No adnexal mass identified. Other: No ascites or focal fluid collections. No signs of peritoneal nodularity or mass. Musculoskeletal: Previous PLIF at L4-5. Degenerative disc disease is noted at the remaining lumbar spine levels. Bilateral facet arthropathy. No acute or suspicious osseous findings. IMPRESSION: 1. Several bilateral lung nodules are again noted. There has been no significant change in the size of these nodules (within 1-2 mm). No new pulmonary lesions identified when compared with previous exam. 2. No evidence for metastatic disease to the abdomen or pelvis. 3. Coronary artery calcifications. 4. Aortic Atherosclerosis (ICD10-I70.0) and Emphysema (ICD10-J43.9). Electronically Signed   By: Waddell Calk M.D.   On: 05/06/2022 13:56   MR BRAIN WO CONTRAST  Result Date: 05/06/2022 CLINICAL DATA:  Memory impairment.  Recent headaches and imbalance EXAM: MRI HEAD WITHOUT CONTRAST TECHNIQUE: Multiplanar, multiecho pulse sequences of the brain and surrounding structures were obtained without intravenous contrast. COMPARISON:  10/16/2005 FINDINGS:  Brain: Isointense mass with hemorrhagic features (hemosiderin at its inferior periphery) measuring up to 2.7 cm. Moderate vasogenic edema in the mass causes effacement of the lower fourth ventricle with third and lateral ventriculomegaly. Mild periventricular FLAIR hyperintensity at the lateral ventricles which is more likely chronic small vessel ischemia than transependymal flow given incomplete appearance in the degree of ventriculomegaly. Gliosis and volume loss at the anterior  left temporal lobe which was also seen on prior, possibly posttraumatic in this location. Case discussed with the patient's daughter who is driving the patient and ER referral has been made. [facility] made aware. Vascular: Major flow voids are preserved Skull and upper cervical spine: Normal marrow signal. Sinuses/Orbits: No significant finding. Other: Small proteinaceous cystic intensity anterior to the hyoid, consistent with thyroglossal duct cyst measuring 1 cm. IMPRESSION: Nearly 3 cm hemorrhagic mass with vasogenic edema in the lower cerebellum. There is effacement of the fourth ventricle with ventriculomegaly. Presumed metastasis in this patient with history malignancy, recommend postcontrast assessment. Electronically Signed   By: Dorn Roulette M.D.   On: 05/06/2022 09:48      05/26/2022 - 06/05/2022 Radiation Therapy   First Treatment Date: 2022-05-26 - Last Treatment Date: 2022-06-05   Plan Name: Brain_SRT Site: Brain Technique: SBRT/SRT-IMRT Mode: Photon Dose Per Fraction: 9 Gy Prescribed Dose (Delivered / Prescribed): 27 Gy / 27 Gy Prescribed Fxs (Delivered / Prescribed): 3 / 3   09/28/2022 Imaging   CT CHEST WO CONTRAST  Result Date: 09/27/2022 CLINICAL DATA:  Non-small cell lung cancer; * Tracking Code: BO * EXAM: CT CHEST WITHOUT CONTRAST TECHNIQUE: Multidetector CT imaging of the chest was performed following the standard protocol without IV contrast. RADIATION DOSE REDUCTION: This exam was performed  according to the departmental dose-optimization program which includes automated exposure control, adjustment of the mA and/or kV according to patient size and/or use of iterative reconstruction technique. COMPARISON:  Multiple priors, most recent PET-CT dated May 18, 2022 and chest CT dated April 13th 2024 FINDINGS: Cardiovascular: Normal heart size. No pericardial effusion. Normal caliber thoracic aorta with severe calcified plaque. Severe coronary artery calcifications. Mediastinum/Nodes: Esophagus is unremarkable. Multinodular left thyroid  goiter unchanged when compared with the prior exam. No enlarged lymph nodes seen in the chest. Lungs/Pleura: Central airways are patent. Moderate centrilobular emphysema. Stable postsurgical findings of prior left lower lobectomy. Stable subpleural linear opacities of the lower left lung, likely due to scarring. Scattered bilateral pulmonary nodules are stable. Reference irregular solid nodule of the right upper lobe measuring 10 mm with possible small ground-glass component located on series 6, image 49, unchanged. Stable irregular solid nodule of the left lung located on image 80. Stable ground-glass nodule of the right upper lobe measuring 9 mm on series 6, image 39. No pleural effusion. Upper Abdomen: Partially visualized simple appearing cyst of the right kidney, no specific follow-up imaging is necessary. Musculoskeletal: No chest wall mass or suspicious bone lesions identified. IMPRESSION: 1. Stable postsurgical findings of prior left lower lobectomy. No evidence of recurrent or metastatic disease. 2. Stable bilateral pulmonary nodules. Recommend attention on follow-up. 3. Aortic Atherosclerosis (ICD10-I70.0) and Emphysema (ICD10-J43.9). Electronically Signed   By: Rea Marc M.D.   On: 09/27/2022 18:55      04/24/2023 Imaging   1. Stable chest CT. No new or progressive metastatic disease in the chest. 2. Stable postsurgical changes from left lower  lobectomy. No evidence of local tumor recurrence. 3. Stable scattered non-solid bilateral pulmonary nodules. 4. No thoracic adenopathy. 5. Three-vessel coronary atherosclerosis. 6. Small hiatal hernia. 7. Aortic Atherosclerosis (ICD10-I70.0) and Emphysema (ICD10-J43.9).        CNS Oncologic History 06/05/22: Completes fractionated SRS to cerebellar metastasis [name])  Interval History: [name] presents today for follow up.  Doesn't report any neurologic symptoms today. No issues with balance or walking.  Continues on observation for lung cancer with Dr. Lonn.  H+P (05/08/22) Patient presents  today to review recent MRI, CNS complaints.  She describes several weeks history of headaches, new from prior.  She attributes them to chemicals sprayed in her house for fleas and bugs, noting that the headache actually resolved once the spray was discontinued last week.  PCP ordered a brain MRI, which demonstrated an enhancing mass in the posterior fossa.  She denies any issues with balance, gait is independent.  No other systemic complains, hasn't followed up regarding history of lung cancer since 2020.  Medications: Current Outpatient Medications on File Prior to Visit  Medication Sig Dispense Refill   acetaminophen  (TYLENOL ) 500 MG tablet Take 1 tablet (500 mg total) by mouth every 6 (six) hours as needed. 30 tablet 0   atorvastatin  (LIPITOR) 10 MG tablet Take 1 tablet (10 mg total) by mouth daily at 6 PM. 90 tablet 3   chlorthalidone  (HYGROTON ) 25 MG tablet Take 1 tablet (25 mg total) by mouth daily. 30 tablet 0   Cholecalciferol  (VITAMIN D -3) 25 MCG (1000 UT) CAPS Take 1 capsule (1,000 Units total) by mouth daily. 90 capsule 1   clopidogrel  (PLAVIX ) 75 MG tablet Take 1 tablet (75 mg total) by mouth daily. 90 tablet 2   gabapentin  (NEURONTIN ) 300 MG capsule Take 1 capsule (300 mg total) by mouth at bedtime. 90 capsule 3   LORazepam  (ATIVAN ) 0.5 MG tablet Take 1 tablet (0.5 mg total) by  mouth 2 (two) times daily as needed for anxiety. 4 tablet 0   magnesium  oxide (MAG-OX) 400 MG tablet Take 1 tablet (400 mg total) by mouth daily. 90 tablet 1   methocarbamol  (ROBAXIN ) 500 MG tablet Take 1 tablet (500 mg total) by mouth as needed for muscle spasms. 30 tablet 2   metoprolol  succinate (TOPROL -XL) 25 MG 24 hr tablet TAKE 1 TABLET(25 MG) BY MOUTH TWICE DAILY 60 tablet 0   Multiple Vitamin (MULTIVITAMIN) tablet Take 1 tablet by mouth at bedtime. 90 tablet 3   telmisartan  (MICARDIS ) 40 MG tablet Take 1 tablet (40 mg total) by mouth daily. 30 tablet 0   No current facility-administered medications on file prior to visit.    Allergies:  Allergies  Allergen Reactions   Aspirin Other (See Comments)    REACTION: stomach upset   Lactose Intolerance (Gi) Diarrhea and Other (See Comments)    can't drink milk. It tears my stomach up.   Adhesive [Tape] Rash   Lisinopril Other (See Comments)    cough   Percocet [Oxycodone -Acetaminophen ] Itching   Past Medical History:  Past Medical History:  Diagnosis Date   Arthritis    Benign hypertensive kidney disease with chronic kidney disease stage I through stage IV, or unspecified(403.10)    sees dr. douglass again in april   Cancer [facility])    lung ca dx'd 02/2013   Chronic kidney disease, stage III (moderate) (HCC)    Cough 02/26/2013   GERD (gastroesophageal reflux disease)    Headache    Hypertension    Hyperthyroidism    took iodine treatment for this   Lung mass 02/27/2013   Neurologic disorder    2009   Shortness of breath    exertion   Thrombocytopenia, unspecified (HCC) 02/26/2013   Unspecified vitamin D  deficiency    Past Surgical History:  Past Surgical History:  Procedure Laterality Date   BACK SURGERY     EYE SURGERY     cataract   EYE SURGERY Left    open up tear duct   LYMPH NODE DISSECTION Left 04/07/2013  Procedure: LYMPH NODE DISSECTION;  Surgeon: [place] KATHEE Jude, MD;  Location: [facility] OR;  Service: Thoracic;   Laterality: Left;   NECK SURGERY     PORT-A-CATH REMOVAL Left 01/09/2014   Procedure: REMOVAL PORT-A-CATH;  Surgeon: [place] KATHEE Jude, MD;  Location: [facility] OR;  Service: Thoracic;  Laterality: Left;   PORTACATH PLACEMENT Left 05/06/2013   Procedure: INSERTION PORT-A-CATH;  Surgeon: [place] KATHEE Jude, MD;  Location: [facility] OR;  Service: Thoracic;  Laterality: Left;   VIDEO ASSISTED THORACOSCOPY (VATS)/ LOBECTOMY Left 04/07/2013   Procedure: VIDEO ASSISTED THORACOSCOPY (VATS)/ LOBECTOMY; INSERTION OF ON-Q PUMP;  Surgeon: Dallas KATHEE Jude, MD;  Location: [facility] OR;  Service: Thoracic;  Laterality: Left;   VIDEO BRONCHOSCOPY N/A 04/07/2013   Procedure: VIDEO BRONCHOSCOPY;  Surgeon: Dallas KATHEE Jude, MD;  Location: [facility] OR;  Service: Thoracic;  Laterality: N/A;   Social History:  Social History   Socioeconomic History   Marital status: Single    Spouse name: Not on file   Number of children: 2   Years of education: 4   Highest education level: Not on file  Occupational History   Not on file  Tobacco Use   Smoking status: Former    Current packs/day: 0.00    Average packs/day: 0.5 packs/day for 60.0 years (30.0 ttl pk-yrs)    Types: Cigarettes    Start date: 02/25/1952    Quit date: 02/25/2012    Years since quitting: 11.4   Smokeless tobacco: Never  Vaping Use   Vaping status: Never Used  Substance and Sexual Activity   Alcohol use: No   Drug use: No   Sexual activity: Not on file  Other Topics Concern   Not on file  Social History Narrative   Right handed   Drinks caffeine   One story home   Lives alone   retired   Social Drivers of Health   Financial Resource Strain: Low Risk  (11/11/2020)   Overall Financial Resource Strain (CARDIA)    Difficulty of Paying Living Expenses: Not hard at all  Food Insecurity: No Food Insecurity (05/19/2022)   Hunger Vital Sign    Worried About Running Out of Food in the Last Year: Never true    Ran Out of Food in the Last Year: Never true   Transportation Needs: No Transportation Needs (05/08/2022)   PRAPARE - Administrator, Civil Service (Medical): No    Lack of Transportation (Non-Medical): No  Physical Activity: Sufficiently Active (11/11/2020)   Exercise Vital Sign    Days of Exercise per Week: 5 days    Minutes of Exercise per Session: 30 min  Stress: No Stress Concern Present (11/11/2020)   [employer] of Occupational Health - Occupational Stress Questionnaire    Feeling of Stress : Not at all  Social Connections: Moderately Integrated (11/11/2020)   Social Connection and Isolation Panel    Frequency of Communication with Friends and Family: More than three times a week    Frequency of Social Gatherings with Friends and Family: More than three times a week    Attends Religious Services: More than 4 times per year    Active Member of [employer] or Organizations: Yes    Attends Banker Meetings: More than 4 times per year    Marital Status: Never married  Intimate Partner Violence: Not At Risk (05/19/2022)   Humiliation, Afraid, Rape, and Kick questionnaire    Fear of Current or Ex-Partner: No    Emotionally Abused: No  Physically Abused: No    Sexually Abused: No   Family History:  Family History  Problem Relation Age of Onset   Breast cancer Mother    Lung cancer Father        smoked   Lung cancer Brother        smoked   Rectal cancer Neg Hx    Stomach cancer Neg Hx    Esophageal cancer Neg Hx    Colon cancer Neg Hx     Review of Systems: Constitutional: Doesn't report fevers, chills or abnormal weight loss Eyes: Doesn't report blurriness of vision Ears, nose, mouth, throat, and face: Doesn't report sore throat Respiratory: Doesn't report cough, dyspnea or wheezes Cardiovascular: Doesn't report palpitation, chest discomfort  Gastrointestinal:  Doesn't report nausea, constipation, diarrhea GU: Doesn't report incontinence Skin: Doesn't report skin rashes Neurological: Per  HPI Musculoskeletal: Doesn't report joint pain Behavioral/Psych: Doesn't report anxiety  Physical Exam: Vitals:   07/24/23 1152  BP: (!) 168/66  Pulse: (!) 52  Resp: 13  Temp: 97.6 F (36.4 C)  SpO2: 99%     KPS: 80. General: Alert, cooperative, pleasant, in no acute distress Head: Normal EENT: No conjunctival injection or scleral icterus.  Lungs: Resp effort normal Cardiac: Regular rate Abdomen: Non-distended abdomen Skin: No rashes cyanosis or petechiae. Extremities: No clubbing or edema  Neurologic Exam: Mental Status: Awake, alert, attentive to examiner. Oriented to self and environment. Language is fluent with intact comprehension.  Cranial Nerves: Visual acuity is grossly normal. Visual fields are full. Extra-ocular movements intact. No ptosis. Face is symmetric Motor: Tone and bulk are normal. Power is full in both arms and legs. Reflexes are symmetric, no pathologic reflexes present.  Sensory: Intact to light touch Gait: Normal.   Labs: I have reviewed the data as listed    Component Value Date/Time   NA 143 04/24/2023 1304   NA 144 07/30/2018 1217   NA 144 05/29/2016 1119   K 4.2 04/24/2023 1304   K 4.4 05/29/2016 1119   CL 110 04/24/2023 1304   CO2 27 04/24/2023 1304   CO2 23 05/29/2016 1119   GLUCOSE 83 04/24/2023 1304   GLUCOSE 91 05/29/2016 1119   BUN 28 (H) 04/24/2023 1304   BUN 21 07/30/2018 1217   BUN 20.8 05/29/2016 1119   CREATININE 1.94 (H) 04/24/2023 1304   CREATININE 1.7 (H) 05/29/2016 1119   CALCIUM  9.7 04/24/2023 1304   CALCIUM  9.5 05/29/2016 1119   PROT 6.7 04/24/2023 1304   PROT 7.1 07/30/2018 1217   PROT 6.8 05/29/2016 1119   ALBUMIN 4.1 04/24/2023 1304   ALBUMIN 4.6 07/30/2018 1217   ALBUMIN 3.7 05/29/2016 1119   AST 26 04/24/2023 1304   AST 28 05/29/2016 1119   ALT 17 04/24/2023 1304   ALT 29 05/29/2016 1119   ALKPHOS 59 04/24/2023 1304   ALKPHOS 68 05/29/2016 1119   BILITOT 0.4 04/24/2023 1304   BILITOT 0.24 05/29/2016  1119   GFRNONAA 25 (L) 04/24/2023 1304   GFRAA 32 (L) 11/04/2018 1120   Lab Results  Component Value Date   WBC 5.5 04/24/2023   NEUTROABS 2.8 04/24/2023   HGB 11.7 (L) 04/24/2023   HCT 35.1 (L) 04/24/2023   MCV 93.6 04/24/2023   PLT 247 04/24/2023    Imaging: CLINICAL DATA:  Brain/CNS neoplasm, assess treatment response. History metastatic lung cancer.   EXAM: MRI HEAD WITHOUT AND WITH CONTRAST   TECHNIQUE: Multiplanar, multiecho pulse sequences of the brain and surrounding structures were obtained without  and with intravenous contrast.   CONTRAST:  6 mL Vueway    COMPARISON:  Head MRI 02/22/2023   FINDINGS: Brain:   New lesions: None.   Larger lesions: None.   Stable or smaller lesions:   1. Unchanged size of the hemorrhagic lesion in the left cerebellar hemisphere with decreased, minimal residual edema at its superior aspect. No residual edema.   Other brain findings: No acute infarct, midline shift, hydrocephalus, or extra-axial fluid collection is evident. Encephalomalacia is again noted in the anterior left temporal lobe. Small T2 hyperintensities in the cerebral white matter bilaterally are unchanged and nonspecific but compatible with mild chronic small vessel ischemic disease. There is mild generalized cerebral atrophy.   Vascular: Major intracranial vascular flow voids are preserved.   Skull and upper cervical spine: Unremarkable bone marrow signal.   Sinuses/Orbits: Bilateral cataract extraction. Paranasal sinuses and mastoid air cells are clear.   Other: None.   IMPRESSION: 1. Decreased, minimal residual enhancement associated with the treated left cerebellar metastasis. 2. No new or progressive intracranial metastatic disease.     Electronically Signed   By: Dasie Hamburg M.D.   On: 06/21/2023 11:20  Assessment/Plan Malignant neoplasm metastatic to brain [facility])  [name] is clinically stable today.  MRI demonstrates stable  response to SRS, no new or progressive disease.  We appreciate the opportunity to participate in the care of [name].  We ask that [name] return to clinic in 4-6 months following next brain MRI, or sooner as needed.  All questions were answered. The patient knows to call the clinic with any problems, questions or concerns. No barriers to learning were detected.  The total time spent in the encounter was 30 minutes and more than 50% was on counseling and review of test results   Arthea MARLA Manns, MD Medical Director of [facility] [place] 07/24/23 12:03 PM

## 2023-07-25 ENCOUNTER — Other Ambulatory Visit: Payer: Self-pay | Admitting: Radiation Therapy

## 2023-07-30 ENCOUNTER — Telehealth: Payer: Self-pay | Admitting: Internal Medicine

## 2023-07-30 NOTE — Telephone Encounter (Signed)
 Scheduled appointments per 7/1 los. Called and left a VM with appointment details for the patient.

## 2023-07-31 ENCOUNTER — Emergency Department (HOSPITAL_COMMUNITY)
Admission: EM | Admit: 2023-07-31 | Discharge: 2023-07-31 | Disposition: A | Discharge status: Home / Self Care | Attending: Emergency Medicine | Admitting: Emergency Medicine

## 2023-07-31 ENCOUNTER — Encounter (HOSPITAL_COMMUNITY): Payer: Self-pay

## 2023-07-31 ENCOUNTER — Other Ambulatory Visit: Payer: Self-pay

## 2023-07-31 ENCOUNTER — Emergency Department (HOSPITAL_COMMUNITY)

## 2023-07-31 ENCOUNTER — Encounter (HOSPITAL_COMMUNITY): Payer: Self-pay | Admitting: Emergency Medicine

## 2023-07-31 ENCOUNTER — Ambulatory Visit (HOSPITAL_COMMUNITY)
Admission: EM | Admit: 2023-07-31 | Discharge: 2023-07-31 | Disposition: A | Discharge status: Home / Self Care | Attending: Family Medicine | Admitting: Family Medicine

## 2023-07-31 DIAGNOSIS — R0789 Other chest pain: Secondary | ICD-10-CM | POA: Diagnosis not present

## 2023-07-31 DIAGNOSIS — Z7901 Long term (current) use of anticoagulants: Secondary | ICD-10-CM | POA: Diagnosis not present

## 2023-07-31 DIAGNOSIS — R001 Bradycardia, unspecified: Secondary | ICD-10-CM | POA: Diagnosis not present

## 2023-07-31 DIAGNOSIS — R079 Chest pain, unspecified: Secondary | ICD-10-CM

## 2023-07-31 LAB — COMPREHENSIVE METABOLIC PANEL WITH GFR
ALT: 21 U/L (ref 0–44)
AST: 36 U/L (ref 15–41)
Albumin: 3.7 g/dL (ref 3.5–5.0)
Alkaline Phosphatase: 46 U/L (ref 38–126)
Anion gap: 11 (ref 5–15)
BUN: 19 mg/dL (ref 8–23)
CO2: 21 mmol/L — ABNORMAL LOW (ref 22–32)
Calcium: 9.6 mg/dL (ref 8.9–10.3)
Chloride: 110 mmol/L (ref 98–111)
Creatinine, Ser: 1.71 mg/dL — ABNORMAL HIGH (ref 0.44–1.00)
GFR, Estimated: 29 mL/min — ABNORMAL LOW (ref >60)
Glucose, Bld: 82 mg/dL (ref 70–99)
Potassium: 4.5 mmol/L (ref 3.5–5.1)
Sodium: 142 mmol/L (ref 135–145)
Total Bilirubin: 0.5 mg/dL (ref 0.0–1.2)
Total Protein: 6.3 g/dL — ABNORMAL LOW (ref 6.5–8.1)

## 2023-07-31 LAB — CBC
HCT: 38.3 % (ref 36.0–46.0)
Hemoglobin: 12.5 g/dL (ref 12.0–15.0)
MCH: 30.9 pg (ref 26.0–34.0)
MCHC: 32.6 g/dL (ref 30.0–36.0)
MCV: 94.6 fL (ref 80.0–100.0)
Platelets: 232 K/uL (ref 150–400)
RBC: 4.05 MIL/uL (ref 3.87–5.11)
RDW: 14.7 % (ref 11.5–15.5)
WBC: 4.6 K/uL (ref 4.0–10.5)
nRBC: 0 % (ref 0.0–0.2)

## 2023-07-31 LAB — TROPONIN I (HIGH SENSITIVITY)
Troponin I (High Sensitivity): 7 ng/L (ref <18)
Troponin I (High Sensitivity): 9 ng/L (ref <18)

## 2023-07-31 MED ORDER — HYDRALAZINE HCL 20 MG/ML IJ SOLN
20.0000 mg | Freq: Once | INTRAMUSCULAR | Status: AC
  Administered 2023-07-31: 20 mg via INTRAVENOUS
  Filled 2023-07-31: qty 1

## 2023-07-31 NOTE — ED Triage Notes (Signed)
 Patient arrives by POV c/o chest pain with shortness of breath x 2-3 weeks. Went to UC and found to be bradycardic in 30s. Patient states she thought it was indigestion or gas at first but pain continued. Pain worse at night while trying to sleep. Denies any dizziness.

## 2023-07-31 NOTE — ED Notes (Signed)
 Patient is being discharged from the Urgent Care and sent to the Emergency Department via POV . Per R. Rising, PA, patient is in need of higher level of care due to bradycardia and chest pain. Patient is aware and verbalizes understanding of plan of care.  Vitals:   07/31/23 1224  BP: (!) 187/52  Pulse: (!) 42  Resp: 14  Temp: 97.8 F (36.6 C)  SpO2: 100%

## 2023-07-31 NOTE — ED Notes (Signed)
 Patient verbalizes understanding of discharge instructions. Opportunity for questioning and answers were provided. Pt discharged from ED.

## 2023-07-31 NOTE — ED Notes (Addendum)
 Patient reports that she has had mid chest pain that radiates into her back x 2 weeks. Patient states she has chest pain when she takes a deep breath and pain is worse when she lays down. Patient states chest is sore to touch. Patient has a rash to her palms x 2-3  weeks. Patient states she was working in her yard and then began having irritation to her palms

## 2023-07-31 NOTE — ED Provider Notes (Signed)
 [facility]    CSN: [id number] Arrival date & time: 07/31/23  1106      History   Chief Complaint Chief Complaint  Patient presents with   Chest Pain    HPI [name] is a 88 y.o. female.  Presents with her daughter 2 to 3-week history of chest pain with breathing.  She reports it has been increasing in severity over the last few days.  Currently rating 8/10. Also sometimes feels short of breath. She is not having any dizziness, headaches, feeling off balance  Past medical history includes malignant neoplasm metastatic to the brain, lung mass, CKD, hyperthyroidism, aortic atherosclerosis   Past Medical History:  Diagnosis Date   Arthritis    Benign hypertensive kidney disease with chronic kidney disease stage I through stage IV, or unspecified(403.10)    sees dr. douglass again in april   Cancer [facility])    lung ca dx'd 02/2013   Chronic kidney disease, stage III (moderate) (HCC)    Cough 02/26/2013   GERD (gastroesophageal reflux disease)    Headache    Hypertension    Hyperthyroidism    took iodine treatment for this   Lung mass 02/27/2013   Neurologic disorder    2009   Shortness of breath    exertion   Thrombocytopenia, unspecified (HCC) 02/26/2013   Unspecified vitamin D  deficiency     Patient Active Problem List   Diagnosis Date Noted   Lipoma of right upper extremity 05/15/2023   Lumbosacral pain 09/20/2022   Malignant neoplasm metastatic to brain (HCC) 05/08/2022   Memory impairment 04/12/2022   Hearing loss of right ear due to cerumen impaction 03/02/2022   Atherosclerosis of aorta (HCC) 06/14/2020   Pulmonary emphysema (HCC) 06/14/2020   Atherosclerosis of native coronary artery of native heart without angina pectoris 06/14/2020   Sacroiliac joint disease 08/02/2017   History of TIA (transient ischemic attack) 08/02/2017   Vitamin D  deficiency 08/02/2017   Arthritis 08/02/2017   Long term current use of anticoagulant therapy 08/02/2017   Left  thyroid  nodule 01/01/2015   Benign hypertension with CKD (chronic kidney disease) stage IV (HCC) 11/06/2013   Anemia in chronic kidney disease 05/28/2013   History of lung cancer 04/07/2013   Essential hypertension, benign 03/05/2013   History of colonic polyps 10/29/2012    Past Surgical History:  Procedure Laterality Date   BACK SURGERY     EYE SURGERY     cataract   EYE SURGERY Left    open up tear duct   LYMPH NODE DISSECTION Left 04/07/2013   Procedure: LYMPH NODE DISSECTION;  Surgeon: Dallas KATHEE Jude, MD;  Location[facility] OR;  Service: Thoracic;  Laterality: Left;   NECK SURGERY     PORT-A-CATH REMOVAL Left 01/09/2014   Procedure: REMOVAL PORT-A-CATH;  Surgeon: Dallas KATHEE Jude, MD;  Location: [facility] OR;  Service: Thoracic;  Laterality: Left;   PORTACATH PLACEMENT Left 05/06/2013   Procedure: INSERTION PORT-A-CATH;  Surgeon: Dallas KATHEE Jude, MD;  Location: [facility] OR;  Service: Thoracic;  Laterality: Left;   VIDEO ASSISTED THORACOSCOPY (VATS)/ LOBECTOMY Left 04/07/2013   Procedure: VIDEO ASSISTED THORACOSCOPY (VATS)/ LOBECTOMY; INSERTION OF ON-Q PUMP;  Surgeon: Dallas KATHEE Jude, MD;  Location: [facility] OR;  Service: Thoracic;  Laterality: Left;   VIDEO BRONCHOSCOPY N/A 04/07/2013   Procedure: VIDEO BRONCHOSCOPY;  Surgeon: Dallas KATHEE Jude, MD;  Location: [facility] OR;  Service: Thoracic;  Laterality: N/A;    OB History   No obstetric history on file.  Home Medications    Prior to Admission medications   Medication Sig Start Date End Date Taking? Authorizing Provider  acetaminophen  (TYLENOL ) 500 MG tablet Take 1 tablet (500 mg total) by mouth every 6 (six) hours as needed. 05/01/23   Purcell Emil Schanz, MD  atorvastatin  (LIPITOR) 10 MG tablet Take 1 tablet (10 mg total) by mouth daily at 6 PM. 05/01/23   Sagardia, Emil Schanz, MD  chlorthalidone  (HYGROTON ) 25 MG tablet Take 1 tablet (25 mg total) by mouth daily. 05/01/23   Lonn Hicks, MD  Cholecalciferol  (VITAMIN D -3) 25 MCG (1000 UT) CAPS  Take 1 capsule (1,000 Units total) by mouth daily. 05/01/23   Sagardia, Miguel Jose, MD  clopidogrel  (PLAVIX ) 75 MG tablet Take 1 tablet (75 mg total) by mouth daily. 05/01/23   Purcell Emil Schanz, MD  gabapentin  (NEURONTIN ) 300 MG capsule Take 1 capsule (300 mg total) by mouth at bedtime. 05/01/23   Purcell Emil Schanz, MD  LORazepam  (ATIVAN ) 0.5 MG tablet Take 1 tablet (0.5 mg total) by mouth 2 (two) times daily as needed for anxiety. 05/01/23   Purcell Emil Schanz, MD  magnesium  oxide (MAG-OX) 400 MG tablet Take 1 tablet (400 mg total) by mouth daily. 05/01/23   Purcell Emil Schanz, MD  methocarbamol  (ROBAXIN ) 500 MG tablet Take 1 tablet (500 mg total) by mouth as needed for muscle spasms. 05/01/23   Purcell Emil Schanz, MD  metoprolol  succinate (TOPROL -XL) 25 MG 24 hr tablet TAKE 1 TABLET(25 MG) BY MOUTH TWICE DAILY 07/18/23   Lonn Hicks, MD  Multiple Vitamin (MULTIVITAMIN) tablet Take 1 tablet by mouth at bedtime. 05/01/23   Purcell Emil Schanz, MD  telmisartan  (MICARDIS ) 40 MG tablet Take 1 tablet (40 mg total) by mouth daily. 05/01/23   Purcell Emil Schanz, MD    Family History Family History  Problem Relation Age of Onset   Breast cancer Mother    Lung cancer Father        smoked   Lung cancer Brother        smoked   Rectal cancer Neg Hx    Stomach cancer Neg Hx    Esophageal cancer Neg Hx    Colon cancer Neg Hx     Social History Social History   Tobacco Use   Smoking status: Former    Current packs/day: 0.00    Average packs/day: 0.5 packs/day for 60.0 years (30.0 ttl pk-yrs)    Types: Cigarettes    Start date: 02/25/1950    Quit date: 02/25/2012    Years since quitting: 11.4   Smokeless tobacco: Never  Vaping Use   Vaping status: Never Used  Substance Use Topics   Alcohol use: No   Drug use: No     Allergies   Aspirin, Lactose intolerance (gi), Adhesive [tape], Lisinopril, and Percocet [oxycodone -acetaminophen ]   Review of Systems Review of Systems  As per  HPI  Physical Exam Triage Vital Signs ED Triage Vitals [07/31/23 1224]  Encounter Vitals Group     BP (!) 187/52     Girls Systolic BP Percentile      Girls Diastolic BP Percentile      Boys Systolic BP Percentile      Boys Diastolic BP Percentile      Pulse Rate (!) 42     Resp 14     Temp 97.8 F (36.6 C)     Temp Source Oral     SpO2 100 %     Weight  Height      Head Circumference      Peak Flow      Pain Score 8     Pain Loc      Pain Education      Exclude from Growth Chart    No data found.  Updated Vital Signs BP (!) 187/52 (BP Location: Right Arm)   Pulse (!) 42   Temp 97.8 F (36.6 C) (Oral)   Resp 14   SpO2 100%    Physical Exam Vitals and nursing note reviewed.  Constitutional:      General: She is not in acute distress.    Appearance: Normal appearance.  HENT:     Mouth/Throat:     Pharynx: Oropharynx is clear.  Eyes:     Conjunctiva/sclera: Conjunctivae normal.  Cardiovascular:     Rate and Rhythm: Bradycardia present.     Pulses:          Radial pulses are 1+ on the right side and 1+ on the left side.  Pulmonary:     Effort: Pulmonary effort is normal.  Abdominal:     Palpations: Abdomen is soft.  Neurological:     Mental Status: She is alert. Mental status is at baseline.     Comments: Per daughter      UC Treatments / Results  Labs (all labs ordered are listed, but only abnormal results are displayed) Labs Reviewed - No data to display  EKG  Radiology No results found.  Procedures Procedures (including critical care time)  Medications Ordered in UC Medications - No data to display  Initial Impression / Assessment and Plan / UC Course  I have reviewed the triage vital signs and the nursing notes.  Pertinent labs & imaging results that were available during my care of the patient were reviewed by me and considered in my medical decision making (see chart for details).  BP elevated 187/52 Initial heart rate 42  EKG  sinus bradycardia with ventricular rate of 38 BPM. Watching live rhythm, rate alternates between 38-44 On chart review, her resting heart rate over last several visits is in the low 50s.  88 year old female with bradycardia and chest pain increasing in severity, requires higher level of care in the emergency department. Sent to ED across the parking lot via POV with daughter   Final Clinical Impressions(s) / UC Diagnoses   Final diagnoses:  Bradycardia  Chest pain, unspecified type   Discharge Instructions   None    ED Prescriptions   None    PDMP not reviewed this encounter.   Leul Narramore, [place], [place] 07/31/23 1250

## 2023-07-31 NOTE — Discharge Instructions (Addendum)
 Your test results today are reassuring.  Follow-up with your primary care doctor.  Return to the emergency department for any new or worsening symptoms of concern.

## 2023-07-31 NOTE — ED Provider Notes (Signed)
 [facility] AT [facility] Provider Note   CSN: [id number] Arrival date & time: 07/31/23  1305     Patient presents with: Chest Pain   [name] is a 88 y.o. female.  {Add pertinent medical, surgical, social history, OB history to HPI:1227} HPI 88 year old female sent to the emergency department for out evaluation of chest pain.  She reports that she has been having chest pain for 2 to 3 weeks.  She describes it as sharp and in the anterior chest.  She states it has been going on without stopping for at least a week.  She does not endorse shortness of breath to me although I did review the it was noted at urgent care.  She and her daughter report no history of coronary artery disease or prior chest pain.  There has been no fall or injury.  It is worse with palpation.    Prior to Admission medications   Medication Sig Start Date End Date Taking? Authorizing Provider  acetaminophen  (TYLENOL ) 500 MG tablet Take 1 tablet (500 mg total) by mouth every 6 (six) hours as needed. 05/01/23   Purcell Emil Schanz, MD  atorvastatin  (LIPITOR) 10 MG tablet Take 1 tablet (10 mg total) by mouth daily at 6 PM. 05/01/23   Sagardia, Emil Schanz, MD  chlorthalidone  (HYGROTON ) 25 MG tablet Take 1 tablet (25 mg total) by mouth daily. 05/01/23   Lonn Hicks, MD  Cholecalciferol  (VITAMIN D -3) 25 MCG (1000 UT) CAPS Take 1 capsule (1,000 Units total) by mouth daily. 05/01/23   Purcell Emil Schanz, MD  clopidogrel  (PLAVIX ) 75 MG tablet Take 1 tablet (75 mg total) by mouth daily. 05/01/23   Sagardia, Miguel Jose, MD  gabapentin  (NEURONTIN ) 300 MG capsule Take 1 capsule (300 mg total) by mouth at bedtime. 05/01/23   Purcell Emil Schanz, MD  LORazepam  (ATIVAN ) 0.5 MG tablet Take 1 tablet (0.5 mg total) by mouth 2 (two) times daily as needed for anxiety. 05/01/23   Purcell Emil Schanz, MD  magnesium  oxide (MAG-OX) 400 MG tablet Take 1 tablet (400 mg total) by mouth daily. 05/01/23   Purcell Emil Schanz, MD  methocarbamol  (ROBAXIN ) 500 MG tablet Take 1 tablet (500 mg total) by mouth as needed for muscle spasms. 05/01/23   Purcell Emil Schanz, MD  metoprolol  succinate (TOPROL -XL) 25 MG 24 hr tablet TAKE 1 TABLET(25 MG) BY MOUTH TWICE DAILY 07/18/23   Lonn Hicks, MD  Multiple Vitamin (MULTIVITAMIN) tablet Take 1 tablet by mouth at bedtime. 05/01/23   Purcell Emil Schanz, MD  telmisartan  (MICARDIS ) 40 MG tablet Take 1 tablet (40 mg total) by mouth daily. 05/01/23   Purcell Emil Schanz, MD    Allergies: Aspirin, Lactose intolerance (gi), Adhesive [tape], Lisinopril, and Percocet [oxycodone -acetaminophen ]    Review of Systems  Updated Vital Signs BP (!) 113/49   Pulse (!) 58   Temp 97.6 F (36.4 C)   Resp 15   Ht 1.524 m (5')   Wt 61.2 kg   SpO2 100%   BMI 26.37 kg/m   Physical Exam Vitals and nursing note reviewed.  Constitutional:      General: She is not in acute distress.    Appearance: She is well-developed.  HENT:     Head: Normocephalic.  Eyes:     Pupils: Pupils are equal, round, and reactive to light.  Cardiovascular:     Rate and Rhythm: Normal rate and regular rhythm.     Heart sounds: Normal heart sounds.  Comments: Chest tender to palpation over upper to mid sternum which patient states reproduces her pain. Pulmonary:     Effort: Pulmonary effort is normal.     Breath sounds: Normal breath sounds.  Abdominal:     Palpations: Abdomen is soft.  Musculoskeletal:        General: Normal range of motion.     Cervical back: Normal range of motion.  Skin:    General: Skin is warm and dry.     Capillary Refill: Capillary refill takes less than 2 seconds.  Neurological:     General: No focal deficit present.     Mental Status: She is alert.  Psychiatric:        Mood and Affect: Mood normal.     (all labs ordered are listed, but only abnormal results are displayed) Labs Reviewed  COMPREHENSIVE METABOLIC PANEL WITH GFR - Abnormal; Notable for the  following components:      Result Value   CO2 21 (*)    Creatinine, Ser 1.71 (*)    Total Protein 6.3 (*)    GFR, Estimated 29 (*)    All other components within normal limits  CBC  TROPONIN I (HIGH SENSITIVITY)  TROPONIN I (HIGH SENSITIVITY)    EKG: EKG Interpretation Date/Time:  Tuesday July 31 2023 13:14:13 EDT Ventricular Rate:  42 PR Interval:  176 QRS Duration:  88 QT Interval:  474 QTC Calculation: 395 R Axis:   60  Text Interpretation: Marked sinus bradycardia with Premature supraventricular complexes T wave abnormality, consider inferolateral ischemia Abnormal ECG When compared with ECG of 31-Jul-2023 12:35, PREVIOUS ECG IS PRESENT Confirmed by Levander Houston [phone]) on 07/31/2023 1:27:09 PM  Radiology: No results found.  {Document cardiac monitor, telemetry assessment procedure when appropriate:32947} Procedures   Medications Ordered in the ED  hydrALAZINE  (APRESOLINE ) injection 20 mg (20 mg Intravenous Given 07/31/23 1513)    Clinical Course as of 07/31/23 1626  Tue Jul 31, 2023  1602 Troponin reviewed and interpreted and no evidence of acute abnormality noted awaiting second troponin [DR]    Clinical Course User Index [DR] Levander Houston, MD   {Click here for ABCD2, HEART and other calculators REFRESH Note before signing:1}                              Medical Decision Making Amount and/or Complexity of Data Reviewed Labs: ordered. Radiology: ordered.  Risk Prescription drug management.   Patient seen and evaluated for chest pain.  Pain appears very musculoskeletal.  However given age and EKG that is abnormal differential diagnosis of serious/life threatening causes of chest pain includes ACS, other diseases of the heart such as myocarditis or pericarditis, lung etiologies such as infection or pneumothorax, diseases of the great vessels such as aortic dissection or AAA, pulmonary embolism, or GI sources such as cholecystitis or other upper abdominal  causes. Doubt ACS- heart score documented, EKG reviewed, Given the timing of pain to ER presentation, single troponin normal so doubt NSTEMI troponin and repeat troponin obtained and WNL Doubt myocarditis/pericarditis/tamponade based on history, review of ekg and labs Doubt aortic dissection based on history and review of imaging Doubt intrinsic lung causes such as pneumonia or pneumothorax, based on history, physical exam, and studies obtained. Doubt PE based on history, physical exam, and PERC Doubt acute GI etiology requiring intervention based on history, physical exam and labs. Patient appears stable for discharge. Return precautions and need for follow up  discussed and patient voices understanding   {Document critical care time when appropriate  Document review of labs and clinical decision tools ie CHADS2VASC2, etc  Document your independent review of radiology images and any outside records  Document your discussion with family members, caretakers and with consultants  Document social determinants of health affecting pt's care  Document your decision making why or why not admission, treatments were needed:32947:::1}   Final diagnoses:  None    ED Discharge Orders     None

## 2023-08-07 DIAGNOSIS — E785 Hyperlipidemia, unspecified: Secondary | ICD-10-CM | POA: Diagnosis not present

## 2023-08-07 DIAGNOSIS — N184 Chronic kidney disease, stage 4 (severe): Secondary | ICD-10-CM | POA: Diagnosis not present

## 2023-08-07 DIAGNOSIS — I129 Hypertensive chronic kidney disease with stage 1 through stage 4 chronic kidney disease, or unspecified chronic kidney disease: Secondary | ICD-10-CM | POA: Diagnosis not present

## 2023-08-07 DIAGNOSIS — R634 Abnormal weight loss: Secondary | ICD-10-CM | POA: Diagnosis not present

## 2023-08-07 DIAGNOSIS — C3490 Malignant neoplasm of unspecified part of unspecified bronchus or lung: Secondary | ICD-10-CM | POA: Diagnosis not present

## 2023-08-14 ENCOUNTER — Ambulatory Visit (INDEPENDENT_AMBULATORY_CARE_PROVIDER_SITE_OTHER): Admitting: Emergency Medicine

## 2023-08-14 ENCOUNTER — Encounter: Payer: Self-pay | Admitting: Emergency Medicine

## 2023-08-14 VITALS — BP 160/80 | HR 90 | Temp 98.1°F | Ht 60.0 in | Wt 135.0 lb

## 2023-08-14 DIAGNOSIS — L308 Other specified dermatitis: Secondary | ICD-10-CM | POA: Diagnosis not present

## 2023-08-14 DIAGNOSIS — N184 Chronic kidney disease, stage 4 (severe): Secondary | ICD-10-CM

## 2023-08-14 DIAGNOSIS — I10 Essential (primary) hypertension: Secondary | ICD-10-CM | POA: Diagnosis not present

## 2023-08-14 DIAGNOSIS — Z85118 Personal history of other malignant neoplasm of bronchus and lung: Secondary | ICD-10-CM

## 2023-08-14 DIAGNOSIS — I129 Hypertensive chronic kidney disease with stage 1 through stage 4 chronic kidney disease, or unspecified chronic kidney disease: Secondary | ICD-10-CM

## 2023-08-14 DIAGNOSIS — J439 Emphysema, unspecified: Secondary | ICD-10-CM | POA: Diagnosis not present

## 2023-08-14 DIAGNOSIS — M940 Chondrocostal junction syndrome [Tietze]: Secondary | ICD-10-CM | POA: Diagnosis not present

## 2023-08-14 DIAGNOSIS — L309 Dermatitis, unspecified: Secondary | ICD-10-CM | POA: Insufficient documentation

## 2023-08-14 DIAGNOSIS — I2510 Atherosclerotic heart disease of native coronary artery without angina pectoris: Secondary | ICD-10-CM

## 2023-08-14 MED ORDER — MELOXICAM 7.5 MG PO TABS
7.5000 mg | ORAL_TABLET | Freq: Every day | ORAL | 0 refills | Status: AC
Start: 2023-08-14 — End: 2023-08-24

## 2023-08-14 MED ORDER — CLOBETASOL PROPIONATE 0.05 % EX CREA: 1.0000 | TOPICAL_CREAM | Freq: Two times a day (BID) | CUTANEOUS | 1 refills | Status: DC

## 2023-08-14 NOTE — Progress Notes (Signed)
 [name] 88 y.o.   Chief Complaint  Patient presents with   Spasms    Patient states that she has been having a lot pain in her chest see was seen in the ED for this  as well but she is still having issues and she is also have pain in her hands and there peeling as well she was working in her yard may have touched something that irritates it    HISTORY OF PRESENT ILLNESS: This is a 88 y.o. female here for follow-up of emergency department visit on 07/31/2023 when she presented with chest pain Blood work and chest x-ray were negative.  Negative troponin.  Unremarkable EKG. Persistent pain worse with movement, most likely musculoskeletal No associated symptoms No new symptoms since leaving the emergency department No other complaints or medical concerns today.  Emergency department visit 07/31/2023, assessment and plan as follows: Patient seen and evaluated for chest pain.  Pain appears very musculoskeletal.  However given age and EKG that is abnormal differential diagnosis of serious/life threatening causes of chest pain includes ACS, other diseases of the heart such as myocarditis or pericarditis, lung etiologies such as infection or pneumothorax, diseases of the great vessels such as aortic dissection or AAA, pulmonary embolism, or GI sources such as cholecystitis or other upper abdominal causes. Doubt ACS- heart score documented, EKG reviewed, Given the timing of pain to ER presentation, single troponin normal so doubt NSTEMI troponin and repeat troponin obtained and WNL Doubt myocarditis/pericarditis/tamponade based on history, review of ekg and labs Doubt aortic dissection based on history and review of imaging Doubt intrinsic lung causes such as pneumonia or pneumothorax, based on history, physical exam, and studies obtained. Doubt PE based on history, physical exam, and PERC Doubt acute GI etiology requiring intervention based on history, physical exam and labs. Patient appears  stable for discharge. Return precautions and need for follow up discussed and patient voices understanding HPI   Prior to Admission medications   Medication Sig Start Date End Date Taking? Authorizing Provider  acetaminophen  (TYLENOL ) 500 MG tablet Take 1 tablet (500 mg total) by mouth every 6 (six) hours as needed. 05/01/23  Yes Raylon Lamson, Emil Schanz, MD  atorvastatin  (LIPITOR) 10 MG tablet Take 1 tablet (10 mg total) by mouth daily at 6 PM. 05/01/23  Yes Reia Viernes, Emil Schanz, MD  chlorthalidone  (HYGROTON ) 25 MG tablet Take 1 tablet (25 mg total) by mouth daily. 05/01/23  Yes Gorsuch, Ni, MD  Cholecalciferol  (VITAMIN D -3) 25 MCG (1000 UT) CAPS Take 1 capsule (1,000 Units total) by mouth daily. 05/01/23  Yes Margareta Laureano, Emil Schanz, MD  clobetasol  cream (TEMOVATE ) 0.05 % Apply 1 Application topically 2 (two) times daily. 08/14/23  Yes Renelle Stegenga, Emil Schanz, MD  clopidogrel  (PLAVIX ) 75 MG tablet Take 1 tablet (75 mg total) by mouth daily. 05/01/23  Yes Eliyah Mcshea, Emil Schanz, MD  gabapentin  (NEURONTIN ) 300 MG capsule Take 1 capsule (300 mg total) by mouth at bedtime. 05/01/23  Yes Dodge Ator, Emil Schanz, MD  LORazepam  (ATIVAN ) 0.5 MG tablet Take 1 tablet (0.5 mg total) by mouth 2 (two) times daily as needed for anxiety. 05/01/23  Yes Weylin Plagge, Emil Schanz, MD  magnesium  oxide (MAG-OX) 400 MG tablet Take 1 tablet (400 mg total) by mouth daily. 05/01/23  Yes Laster Appling, Emil Schanz, MD  meloxicam  (MOBIC ) 7.5 MG tablet Take 1 tablet (7.5 mg total) by mouth daily for 10 days. 08/14/23 08/24/23 Yes Leomar Westberg, Emil Schanz, MD  methocarbamol  (ROBAXIN ) 500 MG tablet Take 1 tablet (500  mg total) by mouth as needed for muscle spasms. 05/01/23  Yes Naiyah Klostermann Jose, MD  metoprolol  succinate (TOPROL -XL) 25 MG 24 hr tablet TAKE 1 TABLET(25 MG) BY MOUTH TWICE DAILY 07/18/23  Yes Lonn Hicks, MD  Multiple Vitamin (MULTIVITAMIN) tablet Take 1 tablet by mouth at bedtime. 05/01/23  Yes Sharnell Knight, Emil Schanz, MD  telmisartan  (MICARDIS ) 40 MG  tablet Take 1 tablet (40 mg total) by mouth daily. 05/01/23  Yes Damarius Karnes, Emil Schanz, MD    Allergies  Allergen Reactions   Aspirin Other (See Comments)    REACTION: stomach upset   Lactose Intolerance (Gi) Diarrhea and Other (See Comments)    can't drink milk. It tears my stomach up.   Adhesive [Tape] Rash   Lisinopril Other (See Comments)    cough   Percocet [Oxycodone -Acetaminophen ] Itching    Patient Active Problem List   Diagnosis Date Noted   Lipoma of right upper extremity 05/15/2023   Lumbosacral pain 09/20/2022   Malignant neoplasm metastatic to brain [facility]) 05/08/2022   Memory impairment 04/12/2022   Hearing loss of right ear due to cerumen impaction 03/02/2022   Atherosclerosis of aorta (HCC) 06/14/2020   Pulmonary emphysema (HCC) 06/14/2020   Atherosclerosis of native coronary artery of native heart without angina pectoris 06/14/2020   Sacroiliac joint disease 08/02/2017   History of TIA (transient ischemic attack) 08/02/2017   Vitamin D  deficiency 08/02/2017   Arthritis 08/02/2017   Long term current use of anticoagulant therapy 08/02/2017   Left thyroid  nodule 01/01/2015   Benign hypertension with CKD (chronic kidney disease) stage IV (HCC) 11/06/2013   Anemia in chronic kidney disease 05/28/2013   History of lung cancer 04/07/2013   Essential hypertension, benign 03/05/2013   History of colonic polyps 10/29/2012    Past Medical History:  Diagnosis Date   Arthritis    Benign hypertensive kidney disease with chronic kidney disease stage I through stage IV, or unspecified(403.10)    sees dr. douglass again in april   Cancer [facility])    lung ca dx'd 02/2013   Chronic kidney disease, stage III (moderate) (HCC)    Cough 02/26/2013   GERD (gastroesophageal reflux disease)    Headache    Hypertension    Hyperthyroidism    took iodine treatment for this   Lung mass 02/27/2013   Neurologic disorder    2009   Shortness of breath    exertion   Thrombocytopenia,  unspecified (HCC) 02/26/2013   Unspecified vitamin D  deficiency     Past Surgical History:  Procedure Laterality Date   BACK SURGERY     EYE SURGERY     cataract   EYE SURGERY Left    open up tear duct   LYMPH NODE DISSECTION Left 04/07/2013   Procedure: LYMPH NODE DISSECTION;  Surgeon: Dallas KATHEE Jude, MD;  Location: [facility] OR;  Service: Thoracic;  Laterality: Left;   NECK SURGERY     PORT-A-CATH REMOVAL Left 01/09/2014   Procedure: REMOVAL PORT-A-CATH;  Surgeon: Dallas KATHEE Jude, MD;  Location: [facility] OR;  Service: Thoracic;  Laterality: Left;   PORTACATH PLACEMENT Left 05/06/2013   Procedure: INSERTION PORT-A-CATH;  Surgeon: Dallas KATHEE Jude, MD;  Location: [facility] OR;  Service: Thoracic;  Laterality: Left;   VIDEO ASSISTED THORACOSCOPY (VATS)/ LOBECTOMY Left 04/07/2013   Procedure: VIDEO ASSISTED THORACOSCOPY (VATS)/ LOBECTOMY; INSERTION OF ON-Q PUMP;  Surgeon: Dallas KATHEE Jude, MD;  Location: [facility] OR;  Service: Thoracic;  Laterality: Left;   VIDEO BRONCHOSCOPY N/A 04/07/2013   Procedure: VIDEO  BRONCHOSCOPY;  Surgeon: Dallas KATHEE Jude, MD;  Location: [facility] OR;  Service: Thoracic;  Laterality: N/A;    Social History   Socioeconomic History   Marital status: Single    Spouse name: Not on file   Number of children: 2   Years of education: 4   Highest education level: Not on file  Occupational History   Not on file  Tobacco Use   Smoking status: Former    Current packs/day: 0.00    Average packs/day: 0.5 packs/day for 60.0 years (30.0 ttl pk-yrs)    Types: Cigarettes    Start date: 02/25/1952    Quit date: 02/25/2012    Years since quitting: 11.4   Smokeless tobacco: Never  Vaping Use   Vaping status: Never Used  Substance and Sexual Activity   Alcohol use: No   Drug use: No   Sexual activity: Not on file  Other Topics Concern   Not on file  Social History Narrative   Right handed   Drinks caffeine   One story home   Lives alone   retired   Social Drivers of Research scientist (physical sciences) Strain: Low Risk  (11/11/2020)   Overall Financial Resource Strain (CARDIA)    Difficulty of Paying Living Expenses: Not hard at all  Food Insecurity: No Food Insecurity (05/19/2022)   Hunger Vital Sign    Worried About Running Out of Food in the Last Year: Never true    Ran Out of Food in the Last Year: Never true  Transportation Needs: No Transportation Needs (05/08/2022)   PRAPARE - Administrator, Civil Service (Medical): No    Lack of Transportation (Non-Medical): No  Physical Activity: Sufficiently Active (11/11/2020)   Exercise Vital Sign    Days of Exercise per Week: 5 days    Minutes of Exercise per Session: 30 min  Stress: No Stress Concern Present (11/11/2020)   [employer] of Occupational Health - Occupational Stress Questionnaire    Feeling of Stress : Not at all  Social Connections: Moderately Integrated (11/11/2020)   Social Connection and Isolation Panel    Frequency of Communication with Friends and Family: More than three times a week    Frequency of Social Gatherings with Friends and Family: More than three times a week    Attends Religious Services: More than 4 times per year    Active Member of [employer] or Organizations: Yes    Attends Banker Meetings: More than 4 times per year    Marital Status: Never married  Intimate Partner Violence: Not At Risk (05/19/2022)   Humiliation, Afraid, Rape, and Kick questionnaire    Fear of Current or Ex-Partner: No    Emotionally Abused: No    Physically Abused: No    Sexually Abused: No    Family History  Problem Relation Age of Onset   Breast cancer Mother    Lung cancer Father        smoked   Lung cancer Brother        smoked   Rectal cancer Neg Hx    Stomach cancer Neg Hx    Esophageal cancer Neg Hx    Colon cancer Neg Hx      Review of Systems  Constitutional: Negative.   HENT: Negative.  Negative for congestion and sore throat.   Respiratory: Negative.  Negative for  cough and shortness of breath.   Cardiovascular:  Positive for chest pain. Negative for palpitations.  Gastrointestinal:  Negative for abdominal pain, diarrhea, nausea and vomiting.  Genitourinary: Negative.  Negative for dysuria and hematuria.  Skin:  Positive for rash (Both hands).  Neurological: Negative.  Negative for dizziness and headaches.  All other systems reviewed and are negative.   Vitals:   08/14/23 1338 08/14/23 1342  BP: (!) 160/80 (!) 160/80  Pulse: 90   Temp: 98.1 F (36.7 C)   SpO2: 99%     Physical Exam Vitals reviewed.  Constitutional:      Appearance: Normal appearance.  HENT:     Head: Normocephalic.     Mouth/Throat:     Mouth: Mucous membranes are moist.     Pharynx: Oropharynx is clear.  Eyes:     Extraocular Movements: Extraocular movements intact.     Pupils: Pupils are equal, round, and reactive to light.  Cardiovascular:     Rate and Rhythm: Normal rate and regular rhythm.     Pulses: Normal pulses.     Heart sounds: Normal heart sounds.  Pulmonary:     Effort: Pulmonary effort is normal.     Breath sounds: Normal breath sounds.  Chest:     Chest wall: Tenderness (Pain reproduced with sternal palpation) present.  Musculoskeletal:     Cervical back: No tenderness.  Lymphadenopathy:     Cervical: No cervical adenopathy.  Skin:    General: Skin is warm and dry.     Capillary Refill: Capillary refill takes less than 2 seconds.     Findings: Rash present.     Comments: Eczematous rash to both palmar surfaces of hands  Neurological:     General: No focal deficit present.     Mental Status: She is alert and oriented to person, place, and time.  Psychiatric:        Mood and Affect: Mood normal.        Behavior: Behavior normal.      ASSESSMENT & PLAN: A total of 45 minutes was spent with the patient and counseling/coordination of care regarding preparing for this visit, review of most recent office visit notes, review of most recent  emergency department visit notes, review of multiple chronic medical conditions and their management, diagnosis of costochondritis and management, review of all medications, review of most recent bloodwork results, review of health maintenance items, education on nutrition, prognosis, documentation, and need for follow up.  Problem List Items Addressed This Visit       Cardiovascular and Mediastinum   Essential hypertension, benign   Well-controlled hypertension Continue chlorthalidone  25 mg and telmisartan  40 mg daily        Benign hypertension with CKD (chronic kidney disease) stage IV (HCC)   She has chronic kidney disease since she has last received treatment She will continue close monitoring and risk factor modifications      Atherosclerosis of native coronary artery of native heart without angina pectoris   No recent anginal episodes Continue Plavix  75 mg daily along with beta-blocker metoprolol  succinate 25 mg daily        Respiratory   Pulmonary emphysema (HCC)   Stable and asymptomatic        Musculoskeletal and Integument   Eczema   Both hands palmar surfaces. Recommend corticosteroid cream like clobetasol       Costochondritis - Primary   Clinical stable.  Very tender on physical examination. Pain reproduced with palpation. Recommend trial of meloxicam  7.5 mg daily for 10 days       Relevant Medications   meloxicam  (MOBIC ) 7.5  MG tablet     Other   History of lung cancer   As per most recent oncologist report: The patient was originally diagnosed with lung cancer after presentation with abnormal cough on background history of smoking, status post surgery, stage Ib Pathology: Adenocarcinoma, Molecular studies done on sample from 2105 showed no actionable mutations. MSI stable, low tumor mutation burden  She completed adjuvant chemotherapy with cisplatin  and Dorcas being in 2015   She was found to have recurrent stage IV metastatic cancer to the brain in  April 2024 with no signs of systemic disease and completed palliative radiation therapy in May 2024   I reviewed imaging study from April 2025 with the patient which showed no evidence of disease recurrence in her chest MRI of the brain from January 2025 show positive response to treatment with no signs of recurrence We discussed future follow-up I plan to repeat imaging study again in 6 months, due in October 2025       Patient Instructions  Costochondritis  Costochondritis is irritation and swelling (inflammation) of the tissue that connects the ribs to the breastbone (sternum). This tissue is called cartilage. This condition causes pain in the front of the chest. The pain often starts slowly. It may be in more than one rib. What are the causes? The cause of this condition is not always known. It can come from stress on the sternum. The cause of this stress could be: Chest injury. Exercise or activity. This may include lifting. Very bad coughing. What increases the risk? Being female. Being 74-71 years old. Starting a new exercise or work activity. Having low levels of vitamin D . Having a condition that makes you cough a lot. What are the signs or symptoms? Chest pain that: Starts slowly. It can be sharp or dull. Gets worse with deep breathing, coughing, or exercise. Gets better with rest. May be worse when you press on your ribs and breastbone. How is this treated? In most cases, this condition goes away on its own over time. You may need to take an NSAID, such as ibuprofen. This can help reduce pain. You may also need to: Rest and stay away from activities that make pain worse. Put heat or ice on the area that hurts. Do exercises to stretch your chest muscles. If these treatments do not help, your doctor may inject a medicine to numb the area. This can help relieve the pain. Follow these instructions at home: Managing pain, stiffness, and swelling     If told, put ice  on the painful area. To do this: Put ice in a plastic bag. Place a towel between your skin and the bag. Leave the ice on for 20 minutes, 2-3 times a day. If told, put heat on the affected area. Do this as often as told by your doctor. Use the heat source that your doctor recommends, such as a moist heat pack or a heating pad. Place a towel between your skin and the heat source. Leave the heat on for 20-30 minutes. If your skin turns bright red, take off the ice or heat right away to prevent skin damage. The risk of skin damage is higher if you cannot feel pain, heat, or cold. Activity Rest as told by your doctor. Do not do things that make your pain worse. This includes activities that use your chest, belly (abdomen), and side muscles. You may have to avoid lifting. Ask your doctor how much you can safely lift. Return to your  normal activities when your doctor says that it is safe. General instructions Take over-the-counter and prescription medicines only as told by your doctor. Contact a doctor if: You have chills or a fever. Your pain does not go away or gets worse. You have a cough that does not go away. Get help right away if: You have a hard time breathing. You have very bad chest pain that does not get better with medicines, heat, or ice. These symptoms may be an emergency. Get help right away. Call 911. Do not wait to see if the symptoms will go away. Do not drive yourself to the hospital. This information is not intended to replace advice given to you by your health care provider. Make sure you discuss any questions you have with your health care provider. Document Revised: 07/28/2021 Document Reviewed: 07/28/2021 Elsevier Patient Education  2024 Elsevier Inc.     Emil Schaumann, MD [facility]

## 2023-08-14 NOTE — Assessment & Plan Note (Signed)
 Both hands palmar surfaces. Recommend corticosteroid cream like clobetasol 

## 2023-08-14 NOTE — Assessment & Plan Note (Signed)
 Stable and asymptomatic

## 2023-08-14 NOTE — Assessment & Plan Note (Signed)
No recent anginal episodes Continue Plavix 75 mg daily along with beta-blocker metoprolol succinate 25 mg daily

## 2023-08-14 NOTE — Assessment & Plan Note (Signed)
 Well-controlled hypertension Continue chlorthalidone  25 mg and telmisartan  40 mg daily

## 2023-08-14 NOTE — Assessment & Plan Note (Addendum)
 As per most recent oncologist report: The patient was originally diagnosed with lung cancer after presentation with abnormal cough on background history of smoking, status post surgery, stage Ib Pathology: Adenocarcinoma, Molecular studies done on sample from 2105 showed no actionable mutations. MSI stable, low tumor mutation burden  She completed adjuvant chemotherapy with cisplatin  and Dorcas being in 2015   She was found to have recurrent stage IV metastatic cancer to the brain in April 2024 with no signs of systemic disease and completed palliative radiation therapy in May 2024   I reviewed imaging study from April 2025 with the patient which showed no evidence of disease recurrence in her chest MRI of the brain from January 2025 show positive response to treatment with no signs of recurrence We discussed future follow-up I plan to repeat imaging study again in 6 months, due in October 2025

## 2023-08-14 NOTE — Patient Instructions (Signed)
 Costochondritis  Costochondritis is irritation and swelling (inflammation) of the tissue that connects the ribs to the breastbone (sternum). This tissue is called cartilage. This condition causes pain in the front of the chest. The pain often starts slowly. It may be in more than one rib. What are the causes? The cause of this condition is not always known. It can come from stress on the sternum. The cause of this stress could be: Chest injury. Exercise or activity. This may include lifting. Very bad coughing. What increases the risk? Being female. Being 25-56 years old. Starting a new exercise or work activity. Having low levels of vitamin D. Having a condition that makes you cough a lot. What are the signs or symptoms? Chest pain that: Starts slowly. It can be sharp or dull. Gets worse with deep breathing, coughing, or exercise. Gets better with rest. May be worse when you press on your ribs and breastbone. How is this treated? In most cases, this condition goes away on its own over time. You may need to take an NSAID, such as ibuprofen. This can help reduce pain. You may also need to: Rest and stay away from activities that make pain worse. Put heat or ice on the area that hurts. Do exercises to stretch your chest muscles. If these treatments do not help, your doctor may inject a medicine to numb the area. This can help relieve the pain. Follow these instructions at home: Managing pain, stiffness, and swelling     If told, put ice on the painful area. To do this: Put ice in a plastic bag. Place a towel between your skin and the bag. Leave the ice on for 20 minutes, 2-3 times a day. If told, put heat on the affected area. Do this as often as told by your doctor. Use the heat source that your doctor recommends, such as a moist heat pack or a heating pad. Place a towel between your skin and the heat source. Leave the heat on for 20-30 minutes. If your skin turns bright red,  take off the ice or heat right away to prevent skin damage. The risk of skin damage is higher if you cannot feel pain, heat, or cold. Activity Rest as told by your doctor. Do not do things that make your pain worse. This includes activities that use your chest, belly (abdomen), and side muscles. You may have to avoid lifting. Ask your doctor how much you can safely lift. Return to your normal activities when your doctor says that it is safe. General instructions Take over-the-counter and prescription medicines only as told by your doctor. Contact a doctor if: You have chills or a fever. Your pain does not go away or gets worse. You have a cough that does not go away. Get help right away if: You have a hard time breathing. You have very bad chest pain that does not get better with medicines, heat, or ice. These symptoms may be an emergency. Get help right away. Call 911. Do not wait to see if the symptoms will go away. Do not drive yourself to the hospital. This information is not intended to replace advice given to you by your health care provider. Make sure you discuss any questions you have with your health care provider. Document Revised: 07/28/2021 Document Reviewed: 07/28/2021 Elsevier Patient Education  2024 [employer].

## 2023-08-14 NOTE — Assessment & Plan Note (Signed)
 Clinical stable.  Very tender on physical examination. Pain reproduced with palpation. Recommend trial of meloxicam  7.5 mg daily for 10 days

## 2023-08-14 NOTE — Assessment & Plan Note (Signed)
She has chronic kidney disease since she has last received treatment She will continue close monitoring and risk factor modifications

## 2023-08-16 ENCOUNTER — Other Ambulatory Visit: Payer: Self-pay | Admitting: Hematology and Oncology

## 2023-08-16 DIAGNOSIS — I129 Hypertensive chronic kidney disease with stage 1 through stage 4 chronic kidney disease, or unspecified chronic kidney disease: Secondary | ICD-10-CM

## 2023-08-27 ENCOUNTER — Telehealth: Payer: Self-pay

## 2023-08-27 NOTE — Telephone Encounter (Signed)
 Patient's daughter, Idell, called and provided with number and directions on how to schedule patient's upcoming CT scan for 10/7. Daughter verbalized and confirmed phone number.  All questions answered during call.

## 2023-08-29 ENCOUNTER — Other Ambulatory Visit: Payer: Self-pay | Admitting: Hematology and Oncology

## 2023-08-29 DIAGNOSIS — I129 Hypertensive chronic kidney disease with stage 1 through stage 4 chronic kidney disease, or unspecified chronic kidney disease: Secondary | ICD-10-CM

## 2023-09-13 ENCOUNTER — Ambulatory Visit

## 2023-10-02 ENCOUNTER — Other Ambulatory Visit: Payer: Self-pay | Admitting: Hematology and Oncology

## 2023-10-02 DIAGNOSIS — I129 Hypertensive chronic kidney disease with stage 1 through stage 4 chronic kidney disease, or unspecified chronic kidney disease: Secondary | ICD-10-CM

## 2023-10-08 ENCOUNTER — Telehealth: Payer: Self-pay

## 2023-10-08 NOTE — Telephone Encounter (Signed)
 Patient's daughter, Idell, called and provided with number and directions on how to schedule patient's upcoming CT scan for 10/7. Daughter verbalized and confirmed phone number.  All questions answered during call.

## 2023-10-16 ENCOUNTER — Other Ambulatory Visit

## 2023-10-30 ENCOUNTER — Other Ambulatory Visit: Payer: Self-pay | Admitting: Emergency Medicine

## 2023-10-30 ENCOUNTER — Encounter (HOSPITAL_COMMUNITY): Payer: Self-pay

## 2023-10-30 ENCOUNTER — Ambulatory Visit (HOSPITAL_COMMUNITY)
Admission: RE | Admit: 2023-10-30 | Discharge: 2023-10-30 | Disposition: A | Discharge status: Home / Self Care | Source: Ambulatory Visit | Attending: Hematology and Oncology | Admitting: Hematology and Oncology

## 2023-10-30 ENCOUNTER — Inpatient Hospital Stay: Attending: Internal Medicine

## 2023-10-30 DIAGNOSIS — I129 Hypertensive chronic kidney disease with stage 1 through stage 4 chronic kidney disease, or unspecified chronic kidney disease: Secondary | ICD-10-CM

## 2023-10-30 DIAGNOSIS — N184 Chronic kidney disease, stage 4 (severe): Secondary | ICD-10-CM | POA: Diagnosis not present

## 2023-10-30 DIAGNOSIS — C7931 Secondary malignant neoplasm of brain: Secondary | ICD-10-CM | POA: Diagnosis present

## 2023-10-30 DIAGNOSIS — R918 Other nonspecific abnormal finding of lung field: Secondary | ICD-10-CM | POA: Diagnosis not present

## 2023-10-30 DIAGNOSIS — C3490 Malignant neoplasm of unspecified part of unspecified bronchus or lung: Secondary | ICD-10-CM | POA: Diagnosis not present

## 2023-10-30 DIAGNOSIS — Z85118 Personal history of other malignant neoplasm of bronchus and lung: Secondary | ICD-10-CM | POA: Insufficient documentation

## 2023-10-30 DIAGNOSIS — I2510 Atherosclerotic heart disease of native coronary artery without angina pectoris: Secondary | ICD-10-CM | POA: Diagnosis not present

## 2023-10-30 DIAGNOSIS — J439 Emphysema, unspecified: Secondary | ICD-10-CM | POA: Diagnosis not present

## 2023-10-30 DIAGNOSIS — M62838 Other muscle spasm: Secondary | ICD-10-CM

## 2023-10-30 LAB — CBC WITH DIFFERENTIAL (CANCER CENTER ONLY)
Abs Immature Granulocytes: 0.01 K/uL (ref 0.00–0.07)
Basophils Absolute: 0.1 K/uL (ref 0.0–0.1)
Basophils Relative: 1 %
Eosinophils Absolute: 0.3 K/uL (ref 0.0–0.5)
Eosinophils Relative: 5 %
HCT: 32.8 % — ABNORMAL LOW (ref 36.0–46.0)
Hemoglobin: 10.9 g/dL — ABNORMAL LOW (ref 12.0–15.0)
Immature Granulocytes: 0 %
Lymphocytes Relative: 19 %
Lymphs Abs: 1.1 K/uL (ref 0.7–4.0)
MCH: 30.6 pg (ref 26.0–34.0)
MCHC: 33.2 g/dL (ref 30.0–36.0)
MCV: 92.1 fL (ref 80.0–100.0)
Monocytes Absolute: 0.9 K/uL (ref 0.1–1.0)
Monocytes Relative: 15 %
Neutro Abs: 3.5 K/uL (ref 1.7–7.7)
Neutrophils Relative %: 60 %
Platelet Count: 231 K/uL (ref 150–400)
RBC: 3.56 MIL/uL — ABNORMAL LOW (ref 3.87–5.11)
RDW: 13.7 % (ref 11.5–15.5)
WBC Count: 5.7 K/uL (ref 4.0–10.5)
nRBC: 0 % (ref 0.0–0.2)

## 2023-10-30 LAB — CMP (CANCER CENTER ONLY)
ALT: 16 U/L (ref 0–44)
AST: 23 U/L (ref 15–41)
Albumin: 4 g/dL (ref 3.5–5.0)
Alkaline Phosphatase: 64 U/L (ref 38–126)
Anion gap: 4 — ABNORMAL LOW (ref 5–15)
BUN: 27 mg/dL — ABNORMAL HIGH (ref 8–23)
CO2: 27 mmol/L (ref 22–32)
Calcium: 9.4 mg/dL (ref 8.9–10.3)
Chloride: 112 mmol/L — ABNORMAL HIGH (ref 98–111)
Creatinine: 1.8 mg/dL — ABNORMAL HIGH (ref 0.44–1.00)
GFR, Estimated: 27 mL/min — ABNORMAL LOW (ref >60)
Glucose, Bld: 87 mg/dL (ref 70–99)
Potassium: 4 mmol/L (ref 3.5–5.1)
Sodium: 143 mmol/L (ref 135–145)
Total Bilirubin: 0.4 mg/dL (ref 0.0–1.2)
Total Protein: 6.7 g/dL (ref 6.5–8.1)

## 2023-11-06 ENCOUNTER — Telehealth: Payer: Self-pay

## 2023-11-06 ENCOUNTER — Inpatient Hospital Stay: Admitting: Hematology and Oncology

## 2023-11-06 ENCOUNTER — Encounter: Payer: Self-pay | Admitting: Hematology and Oncology

## 2023-11-06 VITALS — BP 155/65 | HR 81 | Temp 97.7°F | Resp 18 | Ht 60.0 in | Wt 128.4 lb

## 2023-11-06 DIAGNOSIS — N184 Chronic kidney disease, stage 4 (severe): Secondary | ICD-10-CM | POA: Diagnosis not present

## 2023-11-06 DIAGNOSIS — Z85118 Personal history of other malignant neoplasm of bronchus and lung: Secondary | ICD-10-CM

## 2023-11-06 DIAGNOSIS — I129 Hypertensive chronic kidney disease with stage 1 through stage 4 chronic kidney disease, or unspecified chronic kidney disease: Secondary | ICD-10-CM

## 2023-11-06 DIAGNOSIS — C7931 Secondary malignant neoplasm of brain: Secondary | ICD-10-CM | POA: Diagnosis not present

## 2023-11-06 NOTE — Assessment & Plan Note (Addendum)
 She has chronic kidney disease and chronic hypertension Blood pressure has improved today, compared to prior visit She will continue close monitoring and risk factor modifications

## 2023-11-06 NOTE — Progress Notes (Signed)
 [facility] OFFICE PROGRESS NOTE  Patient Care Team: Purcell Emil Schanz, MD as PCP - General (Internal Medicine) Lonn Hicks, MD as Consulting Physician (Hematology and Oncology) [employer] [place] NOVAK, MD (Inactive) as Consulting Physician (Cardiothoracic Surgery) Lita Lye, OD as Referring Physician (Optometry) Wertman, Sara E, PA-C (Neurology)  Assessment & Plan History of lung cancer The patient was originally diagnosed with lung cancer after presentation with abnormal cough on background history of smoking, status post surgery, stage Ib Pathology: Adenocarcinoma, Molecular studies done on sample from 2105 showed no actionable mutations. MSI stable, low tumor mutation burden  She completed adjuvant chemotherapy with cisplatin  and Navelbine  in 2015   She was found to have recurrent stage IV metastatic cancer to the brain in April 2024 with no signs of systemic disease and completed palliative radiation therapy in May 2024   I reviewed imaging study from October 2025 with the patient which showed no evidence of disease recurrence in her chest We discussed future follow-up I plan to repeat imaging study again in 6 months, due in April 2026 She will continue independent imaging study surveillance for brain metastasis under the guidance of neuro oncologist  Benign hypertension with CKD (chronic kidney disease) stage IV (HCC) She has chronic kidney disease and chronic hypertension Blood pressure has improved today, compared to prior visit She will continue close monitoring and risk factor modifications  Orders Placed This Encounter  Procedures   CT CHEST WO CONTRAST    Standing Status:   Future    Expected Date:   05/06/2024    Expiration Date:   11/05/2024    Preferred imaging location?:   [facility]    Radiology Contrast Protocol - do NOT remove file path:   [email or web address]   CMP (Cancer Center only)    Standing  Status:   Future    Expiration Date:   11/05/2024   CBC with Differential (Cancer Center Only)    Standing Status:   Future    Expiration Date:   11/05/2024     Hicks Lonn, MD  INTERVAL HISTORY: she returns for surveillance follow-up for history of recurrent lung cancer She is here accompanied by her daughter She is doing well Denies recent cough, chest pain or shortness of breath We reviewed blood work and imaging studies  We discussed the importance of preventive care and reviewed the vaccination programs. She does not have any prior allergic reactions to influenza vaccination. She agrees to proceed with influenza vaccination and I recommend the patient to receive her vaccine through her local pharmacy  PHYSICAL EXAMINATION: ECOG PERFORMANCE STATUS: 0 - Asymptomatic  Vitals:   11/06/23 1114  BP: (!) 155/65  Pulse: 81  Resp: 18  Temp: 97.7 F (36.5 C)  SpO2: 100%   Filed Weights   11/06/23 1114  Weight: 128 lb 6.4 oz (58.2 kg)    Relevant data reviewed during this visit included CBC, CMP, CT imaging from October 2025

## 2023-11-06 NOTE — Assessment & Plan Note (Addendum)
 The patient was originally diagnosed with lung cancer after presentation with abnormal cough on background history of smoking, status post surgery, stage Ib Pathology: Adenocarcinoma, Molecular studies done on sample from 2105 showed no actionable mutations. MSI stable, low tumor mutation burden  She completed adjuvant chemotherapy with cisplatin  and Navelbine  in 2015   She was found to have recurrent stage IV metastatic cancer to the brain in April 2024 with no signs of systemic disease and completed palliative radiation therapy in May 2024   I reviewed imaging study from October 2025 with the patient which showed no evidence of disease recurrence in her chest We discussed future follow-up I plan to repeat imaging study again in 6 months, due in April 2026 She will continue independent imaging study surveillance for brain metastasis under the guidance of neuro oncologist

## 2023-11-06 NOTE — Telephone Encounter (Signed)
 Called daughter and reminded her of appt time at 1200 with Dr. Lonn. She verbalized understanding.

## 2023-11-08 ENCOUNTER — Ambulatory Visit (INDEPENDENT_AMBULATORY_CARE_PROVIDER_SITE_OTHER): Admitting: Emergency Medicine

## 2023-11-08 ENCOUNTER — Encounter: Payer: Self-pay | Admitting: Emergency Medicine

## 2023-11-08 VITALS — BP 120/60 | HR 66 | Temp 98.2°F | Ht 60.0 in | Wt 129.0 lb

## 2023-11-08 DIAGNOSIS — C7931 Secondary malignant neoplasm of brain: Secondary | ICD-10-CM | POA: Diagnosis not present

## 2023-11-08 DIAGNOSIS — J439 Emphysema, unspecified: Secondary | ICD-10-CM | POA: Diagnosis not present

## 2023-11-08 DIAGNOSIS — Z85118 Personal history of other malignant neoplasm of bronchus and lung: Secondary | ICD-10-CM | POA: Diagnosis not present

## 2023-11-08 DIAGNOSIS — Z7901 Long term (current) use of anticoagulants: Secondary | ICD-10-CM

## 2023-11-08 DIAGNOSIS — L309 Dermatitis, unspecified: Secondary | ICD-10-CM

## 2023-11-08 DIAGNOSIS — I2510 Atherosclerotic heart disease of native coronary artery without angina pectoris: Secondary | ICD-10-CM | POA: Diagnosis not present

## 2023-11-08 DIAGNOSIS — I10 Essential (primary) hypertension: Secondary | ICD-10-CM | POA: Diagnosis not present

## 2023-11-08 DIAGNOSIS — I700 Atherosclerosis of aorta: Secondary | ICD-10-CM

## 2023-11-08 DIAGNOSIS — I129 Hypertensive chronic kidney disease with stage 1 through stage 4 chronic kidney disease, or unspecified chronic kidney disease: Secondary | ICD-10-CM | POA: Diagnosis not present

## 2023-11-08 DIAGNOSIS — N184 Chronic kidney disease, stage 4 (severe): Secondary | ICD-10-CM

## 2023-11-08 MED ORDER — MUPIROCIN 2 % EX OINT: TOPICAL_OINTMENT | CUTANEOUS | 0 refills | Status: DC

## 2023-11-08 MED ORDER — CLOBETASOL PROPIONATE 0.05 % EX CREA: 1.0000 | TOPICAL_CREAM | Freq: Two times a day (BID) | CUTANEOUS | 1 refills | Status: DC

## 2023-11-08 NOTE — Assessment & Plan Note (Signed)
 Much improved right hand. Mostly on the left hand today. Benign appearance. Recommend Bactroban and clobetasol  alternate twice a day for 7 days.

## 2023-11-08 NOTE — Assessment & Plan Note (Signed)
 BP Readings from Last 3 Encounters:  11/08/23 120/60  11/06/23 (!) 155/65  08/14/23 (!) 160/80  Well-controlled hypertension Continue chlorthalidone  25 mg and telmisartan  40 mg daily

## 2023-11-08 NOTE — Patient Instructions (Signed)
 Health Maintenance After Age 86 After age 27, you are at a higher risk for certain long-term diseases and infections as well as injuries from falls. Falls are a major cause of broken bones and head injuries in people who are older than age 73. Getting regular preventive care can help to keep you healthy and well. Preventive care includes getting regular testing and making lifestyle changes as recommended by your health care provider. Talk with your health care provider about: Which screenings and tests you should have. A screening is a test that checks for a disease when you have no symptoms. A diet and exercise plan that is right for you. What should I know about screenings and tests to prevent falls? Screening and testing are the best ways to find a health problem early. Early diagnosis and treatment give you the best chance of managing medical conditions that are common after age 90. Certain conditions and lifestyle choices may make you more likely to have a fall. Your health care provider may recommend: Regular vision checks. Poor vision and conditions such as cataracts can make you more likely to have a fall. If you wear glasses, make sure to get your prescription updated if your vision changes. Medicine review. Work with your health care provider to regularly review all of the medicines you are taking, including over-the-counter medicines. Ask your health care provider about any side effects that may make you more likely to have a fall. Tell your health care provider if any medicines that you take make you feel dizzy or sleepy. Strength and balance checks. Your health care provider may recommend certain tests to check your strength and balance while standing, walking, or changing positions. Foot health exam. Foot pain and numbness, as well as not wearing proper footwear, can make you more likely to have a fall. Screenings, including: Osteoporosis screening. Osteoporosis is a condition that causes  the bones to get weaker and break more easily. Blood pressure screening. Blood pressure changes and medicines to control blood pressure can make you feel dizzy. Depression screening. You may be more likely to have a fall if you have a fear of falling, feel depressed, or feel unable to do activities that you used to do. Alcohol  use screening. Using too much alcohol  can affect your balance and may make you more likely to have a fall. Follow these instructions at home: Lifestyle Do not drink alcohol  if: Your health care provider tells you not to drink. If you drink alcohol : Limit how much you have to: 0-1 drink a day for women. 0-2 drinks a day for men. Know how much alcohol  is in your drink. In the [place] one drink equals one 12 oz bottle of beer (355 mL), one 5 oz glass of wine (148 mL), or one 1 oz glass of hard liquor (44 mL). Do not use any products that contain nicotine or tobacco. These products include cigarettes, chewing tobacco, and vaping devices, such as e-cigarettes. If you need help quitting, ask your health care provider. Activity  Follow a regular exercise program to stay fit. This will help you maintain your balance. Ask your health care provider what types of exercise are appropriate for you. If you need a cane or walker, use it as recommended by your health care provider. Wear supportive shoes that have nonskid soles. Safety  Remove any tripping hazards, such as rugs, cords, and clutter. Install safety equipment such as grab bars in bathrooms and safety rails on stairs. Keep rooms and walkways  well-lit. General instructions Talk with your health care provider about your risks for falling. Tell your health care provider if: You fall. Be sure to tell your health care provider about all falls, even ones that seem minor. You feel dizzy, tiredness (fatigue), or off-balance. Take over-the-counter and prescription medicines only as told by your health care provider. These include  supplements. Eat a healthy diet and maintain a healthy weight. A healthy diet includes low-fat dairy products, low-fat (lean) meats, and fiber from whole grains, beans, and lots of fruits and vegetables. Stay current with your vaccines. Schedule regular health, dental, and eye exams. Summary Having a healthy lifestyle and getting preventive care can help to protect your health and wellness after age 15. Screening and testing are the best way to find a health problem early and help you avoid having a fall. Early diagnosis and treatment give you the best chance for managing medical conditions that are more common for people who are older than age 42. Falls are a major cause of broken bones and head injuries in people who are older than age 64. Take precautions to prevent a fall at home. Work with your health care provider to learn what changes you can make to improve your health and wellness and to prevent falls. This information is not intended to replace advice given to you by your health care provider. Make sure you discuss any questions you have with your health care provider. Document Revised: 05/31/2020 Document Reviewed: 05/31/2020 Elsevier Patient Education  2024 [employer].

## 2023-11-08 NOTE — Assessment & Plan Note (Signed)
 Clinically stable.  Recently told metastatic lesion to brain was gone

## 2023-11-08 NOTE — Assessment & Plan Note (Signed)
 Continue atorvastatin 10 mg daily

## 2023-11-08 NOTE — Assessment & Plan Note (Signed)
Fall precautions given. No clinical bleeding episodes

## 2023-11-08 NOTE — Assessment & Plan Note (Signed)
No recent anginal episodes Continue Plavix 75 mg daily along with beta-blocker metoprolol succinate 25 mg daily

## 2023-11-08 NOTE — Progress Notes (Signed)
 [name] 88 y.o.   Chief Complaint  Patient presents with   Follow-up    Patient here for 6 month f/u for HTN. Patient is still having left hand pain.     HISTORY OF PRESENT ILLNESS: This is a 88 y.o. female here for 38-month follow-up of chronic medical conditions including hypertension Still having some left hand pain with itchy rash No other complaints or medical concerns today.  HPI   Prior to Admission medications   Medication Sig Start Date End Date Taking? Authorizing Provider  acetaminophen  (TYLENOL ) 500 MG tablet Take 1 tablet (500 mg total) by mouth every 6 (six) hours as needed. 05/01/23  Yes Ahliyah Nienow, Emil Schanz, MD  atorvastatin  (LIPITOR) 10 MG tablet Take 1 tablet (10 mg total) by mouth daily at 6 PM. 05/01/23  Yes Rabiah Goeser, Emil Schanz, MD  chlorthalidone  (HYGROTON ) 25 MG tablet Take 1 tablet (25 mg total) by mouth daily. 05/01/23  Yes Gorsuch, Ni, MD  Cholecalciferol  (VITAMIN D -3) 25 MCG (1000 UT) CAPS Take 1 capsule (1,000 Units total) by mouth daily. 05/01/23  Yes Rhyleigh Grassel, Emil Schanz, MD  clopidogrel  (PLAVIX ) 75 MG tablet Take 1 tablet (75 mg total) by mouth daily. 05/01/23  Yes Rifka Ramey, Emil Schanz, MD  gabapentin  (NEURONTIN ) 300 MG capsule Take 1 capsule (300 mg total) by mouth at bedtime. 05/01/23  Yes Alleta Avery, Emil Schanz, MD  LORazepam  (ATIVAN ) 0.5 MG tablet Take 1 tablet (0.5 mg total) by mouth 2 (two) times daily as needed for anxiety. 10/30/23  Yes Real Cona, Emil Schanz, MD  magnesium  oxide (MAG-OX) 400 MG tablet Take 1 tablet (400 mg total) by mouth daily. 05/01/23  Yes Avalyn Molino, Emil Schanz, MD  methocarbamol  (ROBAXIN ) 500 MG tablet TAKE 1 TABLET BY MOUTH AS NEEDED FOR MUSCLE SPASM 10/30/23  Yes Jayani Rozman, Emil Schanz, MD  metoprolol  succinate (TOPROL -XL) 25 MG 24 hr tablet TAKE 1 TABLET(25 MG) BY MOUTH TWICE DAILY 07/18/23  Yes Lonn Hicks, MD  Multiple Vitamin (MULTIVITAMIN) tablet Take 1 tablet by mouth at bedtime. 05/01/23  Yes Tanysha Quant Jose, MD  mupirocin  ointment WYNEMA) 2 % Apply to affected area twice a day for 7 days 11/08/23  Yes Steward Sames, Emil Schanz, MD  telmisartan  (MICARDIS ) 40 MG tablet TAKE 1 TABLET BY MOUTH ONCE DAILY 10/30/23  Yes Ilijah Doucet, Emil Schanz, MD  clobetasol  cream (TEMOVATE ) 0.05 % Apply 1 Application topically 2 (two) times daily. 11/08/23   Purcell Emil Schanz, MD    Allergies  Allergen Reactions   Aspirin Other (See Comments)    REACTION: stomach upset   Lactose Intolerance (Gi) Diarrhea and Other (See Comments)    can't drink milk. It tears my stomach up.   Adhesive [Tape] Rash   Lisinopril Other (See Comments)    cough   Percocet [Oxycodone -Acetaminophen ] Itching    Patient Active Problem List   Diagnosis Date Noted   Eczema 08/14/2023   Costochondritis 08/14/2023   Lipoma of right upper extremity 05/15/2023   Lumbosacral pain 09/20/2022   Malignant neoplasm metastatic to brain [facility]) 05/08/2022   Memory impairment 04/12/2022   Hearing loss of right ear due to cerumen impaction 03/02/2022   Atherosclerosis of aorta 06/14/2020   Pulmonary emphysema (HCC) 06/14/2020   Atherosclerosis of native coronary artery of native heart without angina pectoris 06/14/2020   Sacroiliac joint disease 08/02/2017   History of TIA (transient ischemic attack) 08/02/2017   Vitamin D  deficiency 08/02/2017   Arthritis 08/02/2017   Long term current use of anticoagulant therapy 08/02/2017  Left thyroid  nodule 01/01/2015   Benign hypertension with CKD (chronic kidney disease) stage IV (HCC) 11/06/2013   Anemia in chronic kidney disease 05/28/2013   History of lung cancer 04/07/2013   Essential hypertension, benign 03/05/2013   History of colonic polyps 10/29/2012    Past Medical History:  Diagnosis Date   Arthritis    Benign hypertensive kidney disease with chronic kidney disease stage I through stage IV, or unspecified(403.10)    sees dr. douglass again in april   Cancer [facility])    lung ca dx'd 02/2013   Chronic  kidney disease, stage III (moderate) (HCC)    Cough 02/26/2013   GERD (gastroesophageal reflux disease)    Headache    Hypertension    Hyperthyroidism    took iodine treatment for this   Lung mass 02/27/2013   Neurologic disorder    2009   Shortness of breath    exertion   Thrombocytopenia, unspecified 02/26/2013   Unspecified vitamin D  deficiency     Past Surgical History:  Procedure Laterality Date   BACK SURGERY     EYE SURGERY     cataract   EYE SURGERY Left    open up tear duct   LYMPH NODE DISSECTION Left 04/07/2013   Procedure: LYMPH NODE DISSECTION;  Surgeon: Dallas KATHEE Jude, MD;  Location: [facility] OR;  Service: Thoracic;  Laterality: Left;   NECK SURGERY     PORT-A-CATH REMOVAL Left 01/09/2014   Procedure: REMOVAL PORT-A-CATH;  Surgeon: Dallas KATHEE Jude, MD;  Location: [facility] OR;  Service: Thoracic;  Laterality: Left;   PORTACATH PLACEMENT Left 05/06/2013   Procedure: INSERTION PORT-A-CATH;  Surgeon: Dallas KATHEE Jude, MD;  Location: [facility] OR;  Service: Thoracic;  Laterality: Left;   VIDEO ASSISTED THORACOSCOPY (VATS)/ LOBECTOMY Left 04/07/2013   Procedure: VIDEO ASSISTED THORACOSCOPY (VATS)/ LOBECTOMY; INSERTION OF ON-Q PUMP;  Surgeon: Dallas KATHEE Jude, MD;  Location: [facility] OR;  Service: Thoracic;  Laterality: Left;   VIDEO BRONCHOSCOPY N/A 04/07/2013   Procedure: VIDEO BRONCHOSCOPY;  Surgeon: Dallas KATHEE Jude, MD;  Location: [facility] OR;  Service: Thoracic;  Laterality: N/A;    Social History   Socioeconomic History   Marital status: Single    Spouse name: Not on file   Number of children: 2   Years of education: 4   Highest education level: Not on file  Occupational History   Not on file  Tobacco Use   Smoking status: Former    Current packs/day: 0.00    Average packs/day: 0.5 packs/day for 60.0 years (30.0 ttl pk-yrs)    Types: Cigarettes    Start date: 02/25/1952    Quit date: 02/25/2012    Years since quitting: 11.7   Smokeless tobacco: Never  Vaping Use   Vaping status:  Never Used  Substance and Sexual Activity   Alcohol use: No   Drug use: No   Sexual activity: Not on file  Other Topics Concern   Not on file  Social History Narrative   Right handed   Drinks caffeine   One story home   Lives alone   retired   Social Drivers of Health   Financial Resource Strain: Low Risk  (11/11/2020)   Overall Financial Resource Strain (CARDIA)    Difficulty of Paying Living Expenses: Not hard at all  Food Insecurity: No Food Insecurity (05/19/2022)   Hunger Vital Sign    Worried About Running Out of Food in the Last Year: Never true    Ran Out of Food in  the Last Year: Never true  Transportation Needs: No Transportation Needs (05/08/2022)   PRAPARE - Administrator, Civil Service (Medical): No    Lack of Transportation (Non-Medical): No  Physical Activity: Sufficiently Active (11/11/2020)   Exercise Vital Sign    Days of Exercise per Week: 5 days    Minutes of Exercise per Session: 30 min  Stress: No Stress Concern Present (11/11/2020)   [employer] of Occupational Health - Occupational Stress Questionnaire    Feeling of Stress : Not at all  Social Connections: Moderately Integrated (11/11/2020)   Social Connection and Isolation Panel    Frequency of Communication with Friends and Family: More than three times a week    Frequency of Social Gatherings with Friends and Family: More than three times a week    Attends Religious Services: More than 4 times per year    Active Member of [employer] or Organizations: Yes    Attends Banker Meetings: More than 4 times per year    Marital Status: Never married  Intimate Partner Violence: Not At Risk (05/19/2022)   Humiliation, Afraid, Rape, and Kick questionnaire    Fear of Current or Ex-Partner: No    Emotionally Abused: No    Physically Abused: No    Sexually Abused: No    Family History  Problem Relation Age of Onset   Breast cancer Mother    Lung cancer Father        smoked    Lung cancer Brother        smoked   Rectal cancer Neg Hx    Stomach cancer Neg Hx    Esophageal cancer Neg Hx    Colon cancer Neg Hx      Review of Systems  Constitutional: Negative.  Negative for chills and fever.  HENT:  Negative for congestion and sore throat.   Respiratory: Negative.  Negative for cough and shortness of breath.   Cardiovascular: Negative.  Negative for chest pain and palpitations.  Gastrointestinal:  Negative for abdominal pain, diarrhea, nausea and vomiting.  Genitourinary: Negative.  Negative for dysuria and hematuria.  Skin:  Positive for itching and rash.  Neurological: Negative.  Negative for dizziness and headaches.  All other systems reviewed and are negative.   Vitals:   11/08/23 1304  BP: 120/60  Pulse: 66  Temp: 98.2 F (36.8 C)  SpO2: 99%    Physical Exam Vitals reviewed.  Constitutional:      Appearance: Normal appearance.  HENT:     Head: Normocephalic.     Mouth/Throat:     Mouth: Mucous membranes are moist.     Pharynx: Oropharynx is clear.  Eyes:     Extraocular Movements: Extraocular movements intact.     Pupils: Pupils are equal, round, and reactive to light.  Cardiovascular:     Rate and Rhythm: Normal rate and regular rhythm.     Pulses: Normal pulses.     Heart sounds: Normal heart sounds.  Pulmonary:     Effort: Pulmonary effort is normal.     Breath sounds: Normal breath sounds.  Abdominal:     Palpations: Abdomen is soft.     Tenderness: There is no abdominal tenderness.  Musculoskeletal:     Cervical back: No tenderness.  Lymphadenopathy:     Cervical: No cervical adenopathy.  Skin:    General: Skin is warm and dry.     Capillary Refill: Capillary refill takes less than 2 seconds.  Comments: Left hand: Thenar area shows dry scaly rash with mild erythema.  No significant tenderness.  No significant swelling.  Neurological:     General: No focal deficit present.     Mental Status: She is alert and oriented to  person, place, and time.  Psychiatric:        Mood and Affect: Mood normal.        Behavior: Behavior normal.      ASSESSMENT & PLAN: A total of 42 minutes was spent with the patient and counseling/coordination of care regarding preparing for this visit, review of most recent office visit notes, review of most recent oncologist office visit notes, diagnosis and treatment of hand dermatitis, review of multiple chronic medical conditions and their management, review of all medications, review of most recent bloodwork results, review of health maintenance items, education on nutrition, prognosis, documentation, and need for follow up.   Problem List Items Addressed This Visit       Cardiovascular and Mediastinum   Essential hypertension, benign - Primary   BP Readings from Last 3 Encounters:  11/08/23 120/60  11/06/23 (!) 155/65  08/14/23 (!) 160/80  Well-controlled hypertension Continue chlorthalidone  25 mg and telmisartan  40 mg daily         Benign hypertension with CKD (chronic kidney disease) stage IV (HCC)   She has chronic kidney disease and chronic hypertension Blood pressure has improved today, compared to prior visit She will continue close monitoring and risk factor modifications      Atherosclerosis of aorta   Continue atorvastatin  10 mg daily.       Atherosclerosis of native coronary artery of native heart without angina pectoris   No recent anginal episodes Continue Plavix  75 mg daily along with beta-blocker metoprolol  succinate 25 mg daily        Respiratory   Pulmonary emphysema (HCC)   Stable and asymptomatic        Nervous and Auditory   Malignant neoplasm metastatic to brain (HCC)   Clinically stable. Recently told metastatic lesion to brain was gone         Musculoskeletal and Integument   Hand dermatitis   Much improved right hand. Mostly on the left hand today. Benign appearance. Recommend Bactroban and clobetasol  alternate twice a day for  7 days.      Relevant Medications   mupirocin ointment (BACTROBAN) 2 %   clobetasol  cream (TEMOVATE ) 0.05 %     Other   History of lung cancer   Oncology office visit 2 days ago assessment and plan as follows: The patient was originally diagnosed with lung cancer after presentation with abnormal cough on background history of smoking, status post surgery, stage Ib Pathology: Adenocarcinoma, Molecular studies done on sample from 2105 showed no actionable mutations. MSI stable, low tumor mutation burden  She completed adjuvant chemotherapy with cisplatin  and Navelbine  in 2015   She was found to have recurrent stage IV metastatic cancer to the brain in April 2024 with no signs of systemic disease and completed palliative radiation therapy in May 2024   I reviewed imaging study from October 2025 with the patient which showed no evidence of disease recurrence in her chest We discussed future follow-up I plan to repeat imaging study again in 6 months, due in April 2026 She will continue independent imaging study surveillance for brain metastasis under the guidance of neuro oncologist      Long term current use of anticoagulant therapy   Fall precautions given. No clinical  bleeding episodes      Patient Instructions  Health Maintenance After Age 69 After age 60, you are at a higher risk for certain long-term diseases and infections as well as injuries from falls. Falls are a major cause of broken bones and head injuries in people who are older than age 32. Getting regular preventive care can help to keep you healthy and well. Preventive care includes getting regular testing and making lifestyle changes as recommended by your health care provider. Talk with your health care provider about: Which screenings and tests you should have. A screening is a test that checks for a disease when you have no symptoms. A diet and exercise plan that is right for you. What should I know about screenings  and tests to prevent falls? Screening and testing are the best ways to find a health problem early. Early diagnosis and treatment give you the best chance of managing medical conditions that are common after age 59. Certain conditions and lifestyle choices may make you more likely to have a fall. Your health care provider may recommend: Regular vision checks. Poor vision and conditions such as cataracts can make you more likely to have a fall. If you wear glasses, make sure to get your prescription updated if your vision changes. Medicine review. Work with your health care provider to regularly review all of the medicines you are taking, including over-the-counter medicines. Ask your health care provider about any side effects that may make you more likely to have a fall. Tell your health care provider if any medicines that you take make you feel dizzy or sleepy. Strength and balance checks. Your health care provider may recommend certain tests to check your strength and balance while standing, walking, or changing positions. Foot health exam. Foot pain and numbness, as well as not wearing proper footwear, can make you more likely to have a fall. Screenings, including: Osteoporosis screening. Osteoporosis is a condition that causes the bones to get weaker and break more easily. Blood pressure screening. Blood pressure changes and medicines to control blood pressure can make you feel dizzy. Depression screening. You may be more likely to have a fall if you have a fear of falling, feel depressed, or feel unable to do activities that you used to do. Alcohol use screening. Using too much alcohol can affect your balance and may make you more likely to have a fall. Follow these instructions at home: Lifestyle Do not drink alcohol if: Your health care provider tells you not to drink. If you drink alcohol: Limit how much you have to: 0-1 drink a day for women. 0-2 drinks a day for men. Know how much  alcohol is in your drink. In the [place] one drink equals one 12 oz bottle of beer (355 mL), one 5 oz glass of wine (148 mL), or one 1 oz glass of hard liquor (44 mL). Do not use any products that contain nicotine or tobacco. These products include cigarettes, chewing tobacco, and vaping devices, such as e-cigarettes. If you need help quitting, ask your health care provider. Activity  Follow a regular exercise program to stay fit. This will help you maintain your balance. Ask your health care provider what types of exercise are appropriate for you. If you need a cane or walker, use it as recommended by your health care provider. Wear supportive shoes that have nonskid soles. Safety  Remove any tripping hazards, such as rugs, cords, and clutter. Install safety equipment such as grab bars  in bathrooms and safety rails on stairs. Keep rooms and walkways well-lit. General instructions Talk with your health care provider about your risks for falling. Tell your health care provider if: You fall. Be sure to tell your health care provider about all falls, even ones that seem minor. You feel dizzy, tiredness (fatigue), or off-balance. Take over-the-counter and prescription medicines only as told by your health care provider. These include supplements. Eat a healthy diet and maintain a healthy weight. A healthy diet includes low-fat dairy products, low-fat (lean) meats, and fiber from whole grains, beans, and lots of fruits and vegetables. Stay current with your vaccines. Schedule regular health, dental, and eye exams. Summary Having a healthy lifestyle and getting preventive care can help to protect your health and wellness after age 94. Screening and testing are the best way to find a health problem early and help you avoid having a fall. Early diagnosis and treatment give you the best chance for managing medical conditions that are more common for people who are older than age 42. Falls are a major  cause of broken bones and head injuries in people who are older than age 29. Take precautions to prevent a fall at home. Work with your health care provider to learn what changes you can make to improve your health and wellness and to prevent falls. This information is not intended to replace advice given to you by your health care provider. Make sure you discuss any questions you have with your health care provider. Document Revised: 05/31/2020 Document Reviewed: 05/31/2020 Elsevier Patient Education  2024 Elsevier Inc.     Emil Schaumann, MD [facility]

## 2023-11-08 NOTE — Assessment & Plan Note (Signed)
 Oncology office visit 2 days ago assessment and plan as follows: The patient was originally diagnosed with lung cancer after presentation with abnormal cough on background history of smoking, status post surgery, stage Ib Pathology: Adenocarcinoma, Molecular studies done on sample from 2105 showed no actionable mutations. MSI stable, low tumor mutation burden  She completed adjuvant chemotherapy with cisplatin  and Navelbine  in 2015   She was found to have recurrent stage IV metastatic cancer to the brain in April 2024 with no signs of systemic disease and completed palliative radiation therapy in May 2024   I reviewed imaging study from October 2025 with the patient which showed no evidence of disease recurrence in her chest We discussed future follow-up I plan to repeat imaging study again in 6 months, due in April 2026 She will continue independent imaging study surveillance for brain metastasis under the guidance of neuro oncologist

## 2023-11-08 NOTE — Assessment & Plan Note (Signed)
 Stable and asymptomatic

## 2023-11-08 NOTE — Assessment & Plan Note (Signed)
 She has chronic kidney disease and chronic hypertension Blood pressure has improved today, compared to prior visit She will continue close monitoring and risk factor modifications

## 2023-11-20 ENCOUNTER — Ambulatory Visit: Admitting: Emergency Medicine
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
## 2023-12-04 ENCOUNTER — Encounter: Payer: Self-pay | Admitting: Physician Assistant

## 2023-12-04 ENCOUNTER — Ambulatory Visit (INDEPENDENT_AMBULATORY_CARE_PROVIDER_SITE_OTHER): Admitting: Physician Assistant

## 2023-12-04 VITALS — BP 140/70 | HR 64 | Resp 20

## 2023-12-04 DIAGNOSIS — R413 Other amnesia: Secondary | ICD-10-CM

## 2023-12-24 ENCOUNTER — Telehealth: Payer: Self-pay

## 2023-12-24 NOTE — Telephone Encounter (Signed)
 Please advise

## 2023-12-24 NOTE — Telephone Encounter (Signed)
 Spoke with patient daughter and she stated that this medication was prescribed for mom and to help with something else but since being on the medication the daughter has notice that mom has been having memory loss/confusion and she felt that it was due to this medication. I looked through her diagnosis and did not come across as to why this was giving to her

## 2023-12-24 NOTE — Telephone Encounter (Signed)
 Copied from CRM [id number]. Topic: Clinical - Medication Question >> Dec 24, 2023  8:07 AM Burnard DEL wrote: Reason for CRM: Patients daughter called in statin that she would like for her mom to be taken off of gabapentin  (NEURONTIN ) 300 MG capsule,due to here hearing a lot of bad things about the medication such as memory loss. He would like to know if theres any other medication that could be prescribed in place of it?

## 2023-12-24 NOTE — Telephone Encounter (Signed)
 I did let the daughter know that the patient can stop the gabapentin 

## 2023-12-24 NOTE — Telephone Encounter (Signed)
 I agree to stopping the gabapentin  but why was she on it in the first place?

## 2023-12-24 NOTE — Telephone Encounter (Signed)
 Thank you :)

## 2023-12-25 ENCOUNTER — Telehealth: Payer: Self-pay | Admitting: *Deleted

## 2023-12-25 ENCOUNTER — Inpatient Hospital Stay: Admission: RE | Admit: 2023-12-25 | Source: Ambulatory Visit

## 2023-12-25 NOTE — Telephone Encounter (Signed)
 PC to patient's daughter Idell - MRI has been scheduled on 02/05/24, F/U appointment wit Dr Buckley scheduled on 02/07/24 at 12:00.  She verbalizes understanding.

## 2023-12-25 NOTE — Telephone Encounter (Signed)
 PC to patient, spoke with her daughter Idell - informed her patient missed her MRI this morning, they were not aware of appointment.  Idell states she will reschedule MRI, number to [facility] given.  Informed Idell that patient has F/U appointment with Dr Buckley on 01/28/24.  She verbalizes understanding, states she will schedule MRI prior to this visit.

## 2023-12-31 ENCOUNTER — Encounter

## 2024-01-25 ENCOUNTER — Other Ambulatory Visit: Payer: Self-pay | Admitting: Emergency Medicine

## 2024-01-25 ENCOUNTER — Telehealth: Payer: Self-pay

## 2024-01-25 DIAGNOSIS — Z8673 Personal history of transient ischemic attack (TIA), and cerebral infarction without residual deficits: Secondary | ICD-10-CM

## 2024-01-25 DIAGNOSIS — I129 Hypertensive chronic kidney disease with stage 1 through stage 4 chronic kidney disease, or unspecified chronic kidney disease: Secondary | ICD-10-CM

## 2024-01-25 DIAGNOSIS — M62838 Other muscle spasm: Secondary | ICD-10-CM

## 2024-01-25 DIAGNOSIS — L309 Dermatitis, unspecified: Secondary | ICD-10-CM

## 2024-01-25 DIAGNOSIS — L308 Other specified dermatitis: Secondary | ICD-10-CM

## 2024-01-25 DIAGNOSIS — Z8639 Personal history of other endocrine, nutritional and metabolic disease: Secondary | ICD-10-CM

## 2024-01-25 NOTE — Telephone Encounter (Unsigned)
 Copied from CRM #[id number]. Topic: Clinical - Medication Refill >> Jan 25, 2024 10:45 AM Viola F wrote: Medication: Multiple Vitamin (MULTIVITAMIN) tablet [518869035] magnesium  oxide (MAG-OX) 400 MG tablet [[id number]] clopidogrel  (PLAVIX ) 75 MG tablet [[id number]] Cholecalciferol  (VITAMIN D -3) 25 MCG (1000 UT) CAPS [[id number]] atorvastatin  (LIPITOR) 10 MG tablet [[id number]] acetaminophen  (TYLENOL ) 500 MG tablet [[id number]] telmisartan  (MICARDIS ) 40 MG tablet [[id number]] methocarbamol  (ROBAXIN ) 500 MG tablet [[id number]] LORazepam  (ATIVAN ) 0.5 MG tablet [[id number]] mupirocin  ointment (BACTROBAN ) 2 % [[id number]] clobetasol  cream (TEMOVATE ) 0.05 % [[id number]]  Has the patient contacted their pharmacy? Yes (Agent: If no, request that the patient contact the pharmacy for the refill. If patient does not wish to contact the pharmacy document the reason why and proceed with request.) (Agent: If yes, when and what did the pharmacy advise?)  This is the patient's preferred pharmacy:  [facility] [phone] - [place], [place] - [address] AT [facility] [address] [place] [address] Phone: [phone] Fax: [phone]  Is this the correct pharmacy for this prescription? Yes If no, delete pharmacy and type the correct one.   Has the prescription been filled recently? Yes  Is the patient out of the medication? Yes  Has the patient been seen for an appointment in the last year OR does the patient have an upcoming appointment? Yes  Can we respond through MyChart? No  Agent: Please be advised that Rx refills may take up to 3 business days. We ask that you follow-up with your pharmacy.

## 2024-01-25 NOTE — Telephone Encounter (Signed)
 Please advise

## 2024-01-25 NOTE — Telephone Encounter (Signed)
 Copied from CRM [id number]. Topic: Referral - Question >> Jan 25, 2024 10:50 AM Viola FALCON wrote: Reason for CRM: Patient daughter Idell wants to know if patient can be referred to a hand doctor because her hand is getting no better and the cream Dr. Purcell prescribed is not working. [phone]

## 2024-01-25 NOTE — Telephone Encounter (Signed)
 Okay to refer but I think it is dermatology which she needs

## 2024-01-28 ENCOUNTER — Ambulatory Visit: Admitting: Internal Medicine

## 2024-01-28 NOTE — Telephone Encounter (Signed)
 Referral has been placed.

## 2024-01-29 ENCOUNTER — Telehealth: Payer: Self-pay

## 2024-01-29 MED ORDER — MAGNESIUM OXIDE 400 MG PO TABS: 400.0000 mg | ORAL_TABLET | Freq: Every day | ORAL | 1 refills | Status: AC

## 2024-01-29 MED ORDER — METHOCARBAMOL 500 MG PO TABS: 500.0000 mg | ORAL_TABLET | Freq: Three times a day (TID) | ORAL | 2 refills | Status: AC | PRN

## 2024-01-29 MED ORDER — MUPIROCIN 2 % EX OINT: TOPICAL_OINTMENT | CUTANEOUS | 0 refills | Status: AC

## 2024-01-29 MED ORDER — LORAZEPAM 0.5 MG PO TABS: 0.5000 mg | ORAL_TABLET | Freq: Two times a day (BID) | ORAL | 1 refills | Status: AC | PRN

## 2024-01-29 MED ORDER — ATORVASTATIN CALCIUM 10 MG PO TABS: 10.0000 mg | ORAL_TABLET | Freq: Every day | ORAL | 1 refills | Status: AC

## 2024-01-29 MED ORDER — ACETAMINOPHEN 500 MG PO TABS: 500.0000 mg | ORAL_TABLET | Freq: Four times a day (QID) | ORAL | 0 refills | Status: AC | PRN

## 2024-01-29 MED ORDER — VITAMIN D-3 25 MCG (1000 UT) PO CAPS: 1000.0000 [IU] | ORAL_CAPSULE | Freq: Every day | ORAL | 1 refills | Status: AC

## 2024-01-29 MED ORDER — CLOPIDOGREL BISULFATE 75 MG PO TABS: 75.0000 mg | ORAL_TABLET | Freq: Every day | ORAL | 2 refills | Status: AC

## 2024-01-29 MED ORDER — CLOBETASOL PROPIONATE 0.05 % EX CREA: 1.0000 | TOPICAL_CREAM | Freq: Two times a day (BID) | CUTANEOUS | 1 refills | Status: AC

## 2024-01-29 MED ORDER — ONE-DAILY MULTI VITAMINS PO TABS: 1.0000 | ORAL_TABLET | Freq: Every day | ORAL | 3 refills | Status: AC

## 2024-01-29 MED ORDER — TELMISARTAN 40 MG PO TABS: 40.0000 mg | ORAL_TABLET | Freq: Every day | ORAL | 1 refills | Status: AC

## 2024-01-29 NOTE — Telephone Encounter (Signed)
 Copied from CRM #[id number]. Topic: Clinical - Medication Refill >> Jan 25, 2024 10:45 AM Viola F wrote: Medication: Multiple Vitamin (MULTIVITAMIN) tablet [[id number]] magnesium  oxide (MAG-OX) 400 MG tablet [[id number]] clopidogrel  (PLAVIX ) 75 MG tablet [[id number]] Cholecalciferol  (VITAMIN D -3) 25 MCG (1000 UT) CAPS [[id number]] atorvastatin  (LIPITOR) 10 MG tablet [[id number]] acetaminophen  (TYLENOL ) 500 MG tablet [[id number]] telmisartan  (MICARDIS ) 40 MG tablet [[id number]] methocarbamol  (ROBAXIN ) 500 MG tablet [[id number]] LORazepam  (ATIVAN ) 0.5 MG tablet [[id number]] mupirocin  ointment (BACTROBAN ) 2 % [[id number]] clobetasol  cream (TEMOVATE ) 0.05 % [[id number]]  Has the patient contacted their pharmacy? Yes (Agent: If no, request that the patient contact the pharmacy for the refill. If patient does not wish to contact the pharmacy document the reason why and proceed with request.) (Agent: If yes, when and what did the pharmacy advise?)  This is the patient's preferred pharmacy:  [facility] [phone] - [place], [place] - [address] AT [facility] [address] [place] [address] Phone: [phone] Fax: [phone]  Is this the correct pharmacy for this prescription? Yes If no, delete pharmacy and type the correct one.   Has the prescription been filled recently? Yes  Is the patient out of the medication? Yes  Has the patient been seen for an appointment in the last year OR does the patient have an upcoming appointment? Yes  Can we respond through MyChart? No  Agent: Please be advised that Rx refills may take up to 3 business days. We ask that you follow-up with your pharmacy. >> Jan 29, 2024  8:35 AM Rea ORN wrote: Pt daughter Idell called to check status of this request. Idell said pt is out of these medications. Idell was advised of 3 business day policy. Please call back to advise, [phone]

## 2024-01-29 NOTE — Telephone Encounter (Signed)
 All medication has been sent in today

## 2024-02-05 ENCOUNTER — Telehealth: Payer: Self-pay

## 2024-02-05 ENCOUNTER — Telehealth: Payer: Self-pay | Admitting: *Deleted

## 2024-02-05 ENCOUNTER — Other Ambulatory Visit

## 2024-02-05 DIAGNOSIS — M1990 Unspecified osteoarthritis, unspecified site: Secondary | ICD-10-CM

## 2024-02-05 NOTE — Telephone Encounter (Signed)
 Copied from CRM [id number]. Topic: Referral - Question >> Feb 05, 2024 10:46 AM Berneda FALCON wrote: Reason for CRM: Daughter, Idell, Is calling stating the hand specialist called for an appt, but they are booking out until September and patient wants to be seen sooner if possible please. Can we place a new referral to somewhere that can see her sooner, please.  Daughter [phone]

## 2024-02-05 NOTE — Telephone Encounter (Signed)
 PC to patient regarding missed MRI appointment today, spoke with her daughter Idell.  Idell states she forgot about the MRI, she states she will reschedule, number to [facility] given.  Also informed Idell patient's appointment on 02/07/24 will be rescheduled after the MRI.  She verbalizes understanding.  MRI now scheduled on 02/26/24, scheduling message sent to schedule F/U visit with Dr Buckley.

## 2024-02-06 ENCOUNTER — Telehealth: Payer: Self-pay | Admitting: Internal Medicine

## 2024-02-06 NOTE — Telephone Encounter (Signed)
 Scheduled patient for a scan review/follow-up in February. Called and spoke with the patients daughter, she is aware of the day and time.

## 2024-02-06 NOTE — Telephone Encounter (Signed)
 Look into the possibility of another referral to a different surgeon

## 2024-02-06 NOTE — Telephone Encounter (Signed)
 Please advise

## 2024-02-07 ENCOUNTER — Inpatient Hospital Stay: Admitting: Internal Medicine

## 2024-02-11 ENCOUNTER — Inpatient Hospital Stay

## 2024-02-12 NOTE — Addendum Note (Signed)
 Addended by: ROSALVA LEX RAMAN on: 02/12/2024 04:11 PM   Modules accepted: Orders

## 2024-02-12 NOTE — Telephone Encounter (Signed)
I have placed another referral

## 2024-02-26 ENCOUNTER — Other Ambulatory Visit

## 2024-03-03 ENCOUNTER — Inpatient Hospital Stay

## 2024-03-06 ENCOUNTER — Inpatient Hospital Stay: Admitting: Internal Medicine

## 2024-03-25 ENCOUNTER — Other Ambulatory Visit

## 2024-03-27 ENCOUNTER — Inpatient Hospital Stay: Admitting: Internal Medicine

## 2024-03-31 ENCOUNTER — Inpatient Hospital Stay

## 2024-04-29 ENCOUNTER — Inpatient Hospital Stay

## 2024-05-06 ENCOUNTER — Inpatient Hospital Stay: Admitting: Hematology and Oncology

## 2024-05-13 ENCOUNTER — Ambulatory Visit: Admitting: Emergency Medicine

## 2024-05-13 ENCOUNTER — Ambulatory Visit

## 2024-06-03 ENCOUNTER — Ambulatory Visit: Admitting: Physician Assistant

## 2024-10-13 ENCOUNTER — Ambulatory Visit: Admitting: Physician Assistant
